# Patient Record
Sex: Male | Born: 1945
Health system: Southern US, Community
[De-identification: ages and names within clinical notes are randomized; demographics above are authoritative.]

## PROBLEM LIST (undated history)

## (undated) DIAGNOSIS — M109 Gout, unspecified: Secondary | ICD-10-CM

## (undated) DIAGNOSIS — D649 Anemia, unspecified: Secondary | ICD-10-CM

## (undated) DIAGNOSIS — Z87442 Personal history of urinary calculi: Secondary | ICD-10-CM

## (undated) DIAGNOSIS — I2510 Atherosclerotic heart disease of native coronary artery without angina pectoris: Secondary | ICD-10-CM

## (undated) DIAGNOSIS — K5792 Diverticulitis of intestine, part unspecified, without perforation or abscess without bleeding: Secondary | ICD-10-CM

## (undated) DIAGNOSIS — I255 Ischemic cardiomyopathy: Secondary | ICD-10-CM

## (undated) DIAGNOSIS — K651 Peritoneal abscess: Secondary | ICD-10-CM

## (undated) DIAGNOSIS — Z91199 Patient's noncompliance with other medical treatment and regimen due to unspecified reason: Secondary | ICD-10-CM

## (undated) DIAGNOSIS — E785 Hyperlipidemia, unspecified: Secondary | ICD-10-CM

## (undated) DIAGNOSIS — I499 Cardiac arrhythmia, unspecified: Secondary | ICD-10-CM

## (undated) DIAGNOSIS — K219 Gastro-esophageal reflux disease without esophagitis: Secondary | ICD-10-CM

## (undated) DIAGNOSIS — B159 Hepatitis A without hepatic coma: Secondary | ICD-10-CM

## (undated) DIAGNOSIS — Z9119 Patient's noncompliance with other medical treatment and regimen: Secondary | ICD-10-CM

## (undated) DIAGNOSIS — E291 Testicular hypofunction: Secondary | ICD-10-CM

## (undated) DIAGNOSIS — I4891 Unspecified atrial fibrillation: Secondary | ICD-10-CM

## (undated) DIAGNOSIS — I214 Non-ST elevation (NSTEMI) myocardial infarction: Secondary | ICD-10-CM

## (undated) HISTORY — DX: Diverticulitis of intestine, part unspecified, without perforation or abscess without bleeding: K57.92

## (undated) HISTORY — DX: Testicular hypofunction: E29.1

## (undated) HISTORY — DX: Patient's noncompliance with other medical treatment and regimen due to unspecified reason: Z91.199

## (undated) HISTORY — DX: Hyperlipidemia, unspecified: E78.5

## (undated) HISTORY — DX: Hepatitis a without hepatic coma: B15.9

## (undated) HISTORY — DX: Gout, unspecified: M10.9

## (undated) HISTORY — DX: Patient's noncompliance with other medical treatment and regimen: Z91.19

## (undated) HISTORY — DX: Gastro-esophageal reflux disease without esophagitis: K21.9

## (undated) HISTORY — PX: COLONOSCOPY: SHX174

---

## 2016-04-30 ENCOUNTER — Encounter: Payer: Self-pay | Admitting: Cardiovascular Disease

## 2016-05-06 ENCOUNTER — Encounter: Payer: Self-pay | Admitting: Cardiovascular Disease

## 2016-05-06 ENCOUNTER — Ambulatory Visit (INDEPENDENT_AMBULATORY_CARE_PROVIDER_SITE_OTHER): Payer: Medicare Other | Admitting: Cardiovascular Disease

## 2016-05-06 VITALS — BP 135/95 | HR 89 | Ht 70.0 in | Wt 185.1 lb

## 2016-05-06 DIAGNOSIS — R0789 Other chest pain: Secondary | ICD-10-CM

## 2016-05-06 DIAGNOSIS — R0609 Other forms of dyspnea: Secondary | ICD-10-CM | POA: Diagnosis not present

## 2016-05-06 DIAGNOSIS — R0602 Shortness of breath: Secondary | ICD-10-CM | POA: Diagnosis not present

## 2016-05-06 NOTE — Progress Notes (Signed)
Cardiology Office Note   Date:  05/06/2016   ID:  Dean [name], DOB 10-May-1946, MRN [id number]  PCP:  No primary care provider on file.  Cardiologist:   Mertie Moores, MD   Chief Complaint  Patient presents with  . Chest Pain   Problem list 1. Chest pain 2. Hyperlipidemia 3. Gout 4. GERD    History of Present Illness: Dean [name] is a 70 y.o. male who presents for further evaluation of some chest pain and shortness of breath. I have reviewed records from [facility] center.  Has intermittant symptoms of DOE and CP Has occasional palpitations  Is hoarse at times  Has had a stress test - normal  Echo was normal .   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   Past Medical History  Diagnosis Date  . Colon polyp   . Hyperlipemia   . Vitamin D deficiency   . Acid reflux   . Gout   . Pollen allergies   . Chest pain   . Screening for HIV (human immunodeficiency virus)   . Need for shingles vaccine   . Special screening for malignant neoplasm of colon   . Alcohol screening   . Cardiac risk counseling   . Non compliance with medical treatment   . Palpitations   . Fatigue   . Rash   . Arthropathy   . Dizziness   . Hypogonadism male   . Urinary frequency   . Night sweats   . Abdominal pain   . Diarrhea   . SOB (shortness of breath)   . Knee pain   . Hematuria     Past Surgical History  Procedure Laterality Date  . None       Current Outpatient Prescriptions  Medication Sig Dispense Refill  . Cholecalciferol (VITAMIN D3) 3000 units TABS Take 3,000 Units by mouth daily.    . diclofenac sodium (VOLTAREN) 1 % GEL Apply 2 g topically 3 (three) times daily.    . febuxostat (ULORIC) 40 MG tablet Take 40 mg by mouth daily.    . indomethacin (INDOCIN) 50 MG capsule Take  50 mg by mouth 3 (three) times daily as needed for mild pain or moderate pain.    Marland Kitchen permethrin (ELIMITE) 5 % cream Apply 1 application topically once. Apply external from neck down at bedtime, wash off in the morning.    . predniSONE (DELTASONE) 5 MG tablet Take 5 mg by mouth daily.     No current facility-administered medications for this visit.    Allergies:   Aspirin ec    Social History:  The patient  reports that he has quit smoking. He does not have any smokeless tobacco history on file. He reports that he does not drink alcohol or use illicit drugs.   Family History:  The patient's family history includes Hyperlipidemia in his brother and mother; Hypertension in his brother and mother.    ROS:  Please see the history of present illness.    Review of Systems: Constitutional:  denies fever, chills, diaphoresis, appetite change and fatigue.  HEENT: denies photophobia, eye pain, redness, hearing loss, ear pain, congestion, sore throat, rhinorrhea, sneezing, neck pain, neck stiffness and tinnitus.  Respiratory: denies SOB, DOE, cough, chest  tightness, and wheezing.  Cardiovascular: denies chest pain, palpitations and leg swelling.  Gastrointestinal: denies nausea, vomiting, abdominal pain, diarrhea, constipation, blood in stool.  Genitourinary: denies dysuria, urgency, frequency, hematuria, flank pain and difficulty urinating.  Musculoskeletal: denies  myalgias, back pain, joint swelling, arthralgias and gait problem.   Skin: denies pallor, rash and wound.  Neurological: denies dizziness, seizures, syncope, weakness, light-headedness, numbness and headaches.   Hematological: denies adenopathy, easy bruising, personal or family bleeding history.  Psychiatric/ Behavioral: denies suicidal ideation, mood changes, confusion, nervousness, sleep disturbance and agitation.       All other systems are reviewed and negative.    PHYSICAL EXAM: VS:  BP 148/100 mmHg  Pulse 89  Ht 5\' 10"   (1.778 m)  Wt 185 lb 1.9 oz (83.97 kg)  BMI 26.56 kg/m2 , BMI Body mass index is 26.56 kg/(m^2). GEN: Well nourished, well developed, in no acute distress HEENT: normal Neck: no JVD, carotid bruits, or masses Cardiac: RRR; no murmurs, rubs, or gallops,no edema  Respiratory:  clear to auscultation bilaterally, normal work of breathing GI: soft, nontender, nondistended, + BS MS: no deformity or atrophy Skin: warm and dry, no rash Neuro:  Strength and sensation are intact Psych: normal   EKG:  EKG is ordered today. The ekg ordered today demonstrates NSR at 89 .   Occasional PVCs    Recent Labs: No results found for requested labs within last 365 days.    Lipid Panel No results found for: CHOL, TRIG, HDL, CHOLHDL, VLDL, LDLCALC, LDLDIRECT    Wt Readings from Last 3 Encounters:  05/06/16 185 lb 1.9 oz (83.97 kg)      Other studies Reviewed: Additional studies/ records that were reviewed today include: . Review of the above records demonstrates:   ASSESSMENT AND PLAN:  1.  Chest pressure  - has already had 2 stress myoviews.   No answers were found as of yet.  Is not inclined to have more stress testing . He does not exercise per se but is active [place]  It sounds like he's not really doing any true cardio exercise. I've challenged him to start a cardio workout program. If he's able to progress with his workouts denied do not think that he has significant coronary artery disease. He has passed his 2 previous stress test.  I will see him in 3 months for follow-up visit.  2. DOE- He has intermittent shortness of breath. At times he states that he has trouble getting out of his car but at other times is able to walk around town and to [employer] without any significant problems. I've reassured him that this does not sound consistent with coronary artery disease. We'll have him start a regular aerobic walking program. He'll call us if he has further  chest pain with walking.  3.  Hyperlipidemia - does not want to start a statin.   We will need to address that at a later visit.  Current medicines are reviewed at length with the patient today.  The patient does not have concerns regarding medicines.  The following changes have been made:  no change  Labs/ tests ordered today include:  No orders of the defined types were placed in this encounter.     Disposition:   FU with me in 3 months      Mertie Moores, MD  05/06/2016 3:56 PM    [address], [place], [place]  [address]  Phone: [phone]; Fax: [phone]   [facility]  [address] [place], [place]  [address] [phone]   Fax [phone]

## 2016-05-06 NOTE — Patient Instructions (Signed)
Medication Instructions:  Your physician recommends that you continue on your current medications as directed. Please refer to the Current Medication list given to you today.   Labwork: None Ordered   Testing/Procedures: None Ordered   Follow-Up: Your physician recommends that you schedule a follow-up appointment in: 3 months with Dr. Nahser   If you need a refill on your cardiac medications before your next appointment, please call your pharmacy.   Thank you for choosing [facility]! Finneus Kaneshiro, RN [phone]    

## 2016-05-17 ENCOUNTER — Encounter: Payer: Self-pay | Admitting: Cardiovascular Disease

## 2016-07-21 ENCOUNTER — Encounter: Payer: Self-pay | Admitting: Cardiovascular Disease

## 2016-08-16 ENCOUNTER — Ambulatory Visit: Payer: Medicare Other | Admitting: Cardiovascular Disease

## 2016-08-26 ENCOUNTER — Encounter: Payer: Self-pay | Admitting: Cardiovascular Disease

## 2016-09-13 ENCOUNTER — Ambulatory Visit (INDEPENDENT_AMBULATORY_CARE_PROVIDER_SITE_OTHER): Payer: Medicare Other | Admitting: Cardiovascular Disease

## 2016-09-13 ENCOUNTER — Encounter (INDEPENDENT_AMBULATORY_CARE_PROVIDER_SITE_OTHER): Payer: Self-pay

## 2016-09-13 ENCOUNTER — Encounter: Payer: Self-pay | Admitting: Cardiovascular Disease

## 2016-09-13 VITALS — BP 140/100 | HR 88 | Ht 70.0 in | Wt 179.0 lb

## 2016-09-13 DIAGNOSIS — R0789 Other chest pain: Secondary | ICD-10-CM | POA: Diagnosis not present

## 2016-09-13 NOTE — Progress Notes (Signed)
Cardiology Office Note   Date:  09/13/2016   ID:  Vong Barger, DOB [birth year], MRN [id number]  PCP:  [facility]  Cardiologist:   Mertie Moores, MD   Chief Complaint  Patient presents with  . Follow-up    shortness of breath    Problem list 1. Chest pain 2. Hyperlipidemia 3. Gout 4. GERD    History of Present Illness: Dean [name] is a 70 y.o. male who presents for further evaluation of some chest pain and shortness of breath. I have reviewed records from [facility] center.  Has intermittant symptoms of DOE and CP Has occasional palpitations  Is hoarse at times  Has had a stress test - normal  Echo was normal .   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   Oct. 9, 2017:  Has started exercising some.   Is having gout issues which limits his exercise at time  Able to walk around without any significant CP or dyspnea.     Past Medical History:  Diagnosis Date  . Abdominal pain   . Acid reflux   . Alcohol screening   . Arthropathy   . Cardiac risk counseling   . Chest pain   . Colon polyp   . Diarrhea   . Dizziness   . Fatigue   . Gout   . Hematuria   . Hyperlipemia   . Hypogonadism male   . Knee pain   . Need for shingles vaccine   . Night sweats   . Non compliance with medical treatment   . Palpitations   . Pollen allergies   . Rash   . Screening for HIV (human immunodeficiency virus)   . SOB (shortness of breath)   . Special screening for malignant neoplasm of colon   . Urinary frequency   . Vitamin D deficiency     Past Surgical History:  Procedure Laterality Date  . NONE       Current Outpatient Prescriptions  Medication Sig Dispense Refill  . Cholecalciferol (VITAMIN D3) 3000 units TABS Take 3,000 Units by mouth daily.    .  febuxostat (ULORIC) 40 MG tablet Take 40 mg by mouth daily.    . predniSONE (DELTASONE) 5 MG tablet Take 5 mg by mouth daily.     No current facility-administered medications for this visit.     Allergies:   Aspirin ec [aspirin]    Social History:  The patient  reports that he has quit smoking. He has never used smokeless tobacco. He reports that he does not drink alcohol or use drugs.   Family History:  The patient's family history includes Hyperlipidemia in his brother and mother; Hypertension in his brother and mother.    ROS:  Please see the history of present illness.    Review of Systems: Constitutional:  denies fever, chills, diaphoresis, appetite change and fatigue.  HEENT: denies photophobia, eye pain, redness, hearing loss, ear pain, congestion, sore throat, rhinorrhea, sneezing, neck pain, neck stiffness and tinnitus.  Respiratory: denies SOB, DOE, cough, chest tightness, and wheezing.  Cardiovascular: denies chest pain, palpitations and leg swelling.  Gastrointestinal: denies nausea, vomiting, abdominal pain, diarrhea, constipation, blood in stool.  Genitourinary: denies dysuria, urgency, frequency, hematuria, flank pain and difficulty  urinating.  Musculoskeletal: denies  myalgias, back pain, joint swelling, arthralgias and gait problem.   Skin: denies pallor, rash and wound.  Neurological: denies dizziness, seizures, syncope, weakness, light-headedness, numbness and headaches.   Hematological: denies adenopathy, easy bruising, personal or family bleeding history.  Psychiatric/ Behavioral: denies suicidal ideation, mood changes, confusion, nervousness, sleep disturbance and agitation.       All other systems are reviewed and negative.    PHYSICAL EXAM: VS:  BP (!) 140/100 (BP Location: Right Arm, Patient Position: Sitting, Cuff Size: Normal)   Pulse 88   Ht 5\' 10"  (1.778 m)   Wt 179 lb (81.2 kg)   BMI 25.68 kg/m  , BMI Body mass index is 25.68 kg/m. GEN: Well  nourished, well developed, in no acute distress  HEENT: normal  Neck: no JVD, carotid bruits, or masses Cardiac: RRR; no murmurs, rubs, or gallops,no edema  Respiratory:  clear to auscultation bilaterally, normal work of breathing GI: soft, nontender, nondistended, + BS MS: no deformity or atrophy  Skin: warm and dry, no rash Neuro:  Strength and sensation are intact Psych: normal   EKG:  EKG is ordered today. The ekg ordered today demonstrates NSR at 89 .   Occasional PVCs    Recent Labs: No results found for requested labs within last 8760 hours.    Lipid Panel No results found for: CHOL, TRIG, HDL, CHOLHDL, VLDL, LDLCALC, LDLDIRECT    Wt Readings from Last 3 Encounters:  09/13/16 179 lb (81.2 kg)  05/06/16 185 lb 1.9 oz (84 kg)      Other studies Reviewed: Additional studies/ records that were reviewed today include: . Review of the above records demonstrates:   ASSESSMENT AND PLAN:  1.  Chest pressure  - has already had 2 stress myoviews.   No answers were found as of yet.  Is not inclined to have more stress testing . No further episodes   I will see him in 3 months for follow-up visit.  2. DOE- He has intermittent shortness of breath. At times he states that he has trouble getting out of his car but at other times is able to walk around town and to [employer] without any significant problems. I've reassured him that this does not sound consistent with coronary artery disease. We'll have him start a regular aerobic walking program. He'll call us if he has further chest pain with walking.  His symptoms are completely better. He's now able to do all his normal activities without significant shortness of breath or chest pain. I'll see him on an as-needed basis.  3.  Hyperlipidemia - does not want to start a statin.    He'll discuss this with his medical doctor.   I'll see him on an as-needed basis.  Current medicines are reviewed at length with the patient  today.  The patient does not have concerns regarding medicines.  The following changes have been made:  no change  Labs/ tests ordered today include:  No orders of the defined types were placed in this encounter.    Disposition:   FU with me as needed.      Mertie Moores, MD  09/13/2016 4:33 PM    [facility] [place], [place], [place]  [address] Phone: [phone]; Fax: [phone]   [facility]  [address] [place], [place]  [address] [phone]   Fax [phone]

## 2016-09-13 NOTE — Patient Instructions (Signed)

## 2017-07-22 ENCOUNTER — Encounter: Payer: Self-pay | Admitting: *Deleted

## 2017-07-25 ENCOUNTER — Ambulatory Visit (INDEPENDENT_AMBULATORY_CARE_PROVIDER_SITE_OTHER): Payer: Medicare Other | Admitting: Cardiovascular Disease

## 2017-07-25 ENCOUNTER — Encounter: Payer: Self-pay | Admitting: Cardiovascular Disease

## 2017-07-25 VITALS — BP 110/70 | HR 81 | Ht 69.5 in | Wt 169.8 lb

## 2017-07-25 DIAGNOSIS — R5383 Other fatigue: Secondary | ICD-10-CM

## 2017-07-25 DIAGNOSIS — R079 Chest pain, unspecified: Secondary | ICD-10-CM | POA: Diagnosis not present

## 2017-07-25 DIAGNOSIS — I493 Ventricular premature depolarization: Secondary | ICD-10-CM | POA: Diagnosis not present

## 2017-07-25 DIAGNOSIS — R9431 Abnormal electrocardiogram [ECG] [EKG]: Secondary | ICD-10-CM

## 2017-07-25 NOTE — Patient Instructions (Addendum)
Medication Instructions:  Your physician recommends that you continue on your current medications as directed. Please refer to the Current Medication list given to you today.   Labwork: Your physician recommends that you return for lab work in: a few days prior to your Coronary CT    Testing/Procedures: Your physician has requested that you have an echocardiogram. Echocardiography is a painless test that uses sound waves to create images of your heart. It provides your doctor with information about the size and shape of your heart and how well your heart's chambers and valves are working. This procedure takes approximately one hour. There are no restrictions for this procedure.  Your physician has requested that you have a Coronary CT. You will receive a call from our scheduling team to get this schedule. The test will be done at [facility]: Your physician recommends that you schedule a follow-up appointment in: 3 months with Dr. Acie Fredrickson   If you need a refill on your cardiac medications before your next appointment, please call your pharmacy.   Thank you for choosing [facility]! Christen Bame, RN [phone]

## 2017-07-25 NOTE — Progress Notes (Signed)
Cardiology Office Note   Date:  07/25/2017   ID:  Dean [name], DOB 1946/06/04, MRN [id number]  PCP:  [facility] [place]  Cardiologist:   Mertie Moores, MD   Chief Complaint  Patient presents with  . Follow-up    Eval PVC's per [name], PA @ [facility]   Problem list 1. Chest pain 2. Hyperlipidemia 3. Gout 4. GERD    History of Present Illness: Dean [name] is a 71 y.o. male who presents for further evaluation of some chest pain and shortness of breath. I have reviewed records from [facility] center.  Has intermittant symptoms of DOE and CP Has occasional palpitations  Is hoarse at times  Has had a stress test - normal  Echo was normal .   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   Oct. 9, 2017:  Has started exercising some.   Is having gout issues which limits his exercise at time  Able to walk around without any significant CP or dyspnea.    Aug. 20, 2018  Dean [name] is seen back for follow-up visit for his premature ventricular contractions. Is taking Uloric for gouty tophi, Makes him nauseated, fatigued.   Has not had any exertional CP recently  Is on Indocin Saw his GP , saw something odd on ECG and he was sent here.  ECG here shows NSR , NS ST abn.  PVCs  Does not exercise,   No hx of CP or DOE Has rare episodes of CP when he is lying down .    Past Medical History:  Diagnosis Date  . Abdominal pain   . Acid reflux   . Alcohol screening   . Arthropathy   . Cardiac risk counseling   . Chest pain   . Colon polyp   . Diarrhea   . Dizziness   . Fatigue   . Gout   . Hematuria   . Hyperlipemia   . Hypogonadism male   . Knee pain   . Need for shingles vaccine   . Night sweats   . Non compliance with medical treatment   .  Palpitations   . Pollen allergies   . Rash   . Screening for HIV (human immunodeficiency virus)   . SOB (shortness of breath)   . Special screening for malignant neoplasm of colon   . Urinary frequency   . Vitamin D deficiency     Past Surgical History:  Procedure Laterality Date  . NONE       Current Outpatient Prescriptions  Medication Sig Dispense Refill  . Cholecalciferol (VITAMIN D3) 3000 units TABS Take 3,000 Units by mouth daily.    . febuxostat (ULORIC) 40 MG tablet Take 40 mg by mouth daily.    . indomethacin (INDOCIN) 50 MG capsule Take 50 mg by mouth as directed. Take 50 mg by mouth as needed at the first sign of gout     No current facility-administered medications for this visit.     Allergies:   Aspirin ec [aspirin]    Social History:  The patient  reports that he has quit smoking. He has never used smokeless tobacco. He reports that he does not drink alcohol or use drugs.   Family History:  The patient's family history includes Hyperlipidemia in his brother and mother; Hypertension in his brother and mother.    ROS:  Please see the history of present illness.    Review of Systems: Constitutional:  denies fever, chills, diaphoresis, appetite change and fatigue.  HEENT: denies photophobia, eye pain, redness, hearing loss, ear pain, congestion, sore throat, rhinorrhea, sneezing, neck pain, neck stiffness and tinnitus.  Respiratory: denies SOB, DOE, cough, chest tightness, and wheezing.  Cardiovascular: denies chest pain, palpitations and leg swelling.  Gastrointestinal: denies nausea, vomiting, abdominal pain, diarrhea, constipation, blood in stool.  Genitourinary: denies dysuria, urgency, frequency, hematuria, flank pain and difficulty urinating.  Musculoskeletal: denies  myalgias, back pain, joint swelling, arthralgias and gait problem.   Skin: denies pallor, rash and wound.  Neurological: denies dizziness, seizures, syncope, weakness, light-headedness,  numbness and headaches.   Hematological: denies adenopathy, easy bruising, personal or family bleeding history.  Psychiatric/ Behavioral: denies suicidal ideation, mood changes, confusion, nervousness, sleep disturbance and agitation.       All other systems are reviewed and negative.    PHYSICAL EXAM: VS:  BP 110/70   Pulse 81   Ht 5' 9.5" (1.765 m)   Wt 169 lb 12.8 oz (77 kg)   BMI 24.72 kg/m  , BMI Body mass index is 24.72 kg/m. GEN: Well nourished, well developed, in no acute distress  HEENT: normal  Neck: no JVD, carotid bruits, or masses Cardiac: RRR; no murmurs, rubs, or gallops,no edema  Respiratory:  clear to auscultation bilaterally, normal work of breathing GI: soft, nontender, nondistended, + BS MS: no deformity or atrophy  Skin: warm and dry, no rash Neuro:  Strength and sensation are intact Psych: normal   EKG:  EKG is ordered today. The ekg ordered today demonstrates NSR at 89 .    NS ST abn.  Occasional PVCs    Recent Labs: No results found for requested labs within last 8760 hours.    Lipid Panel No results found for: CHOL, TRIG, HDL, CHOLHDL, VLDL, LDLCALC, LDLDIRECT    Wt Readings from Last 3 Encounters:  07/25/17 169 lb 12.8 oz (77 kg)  09/13/16 179 lb (81.2 kg)  05/06/16 185 lb 1.9 oz (84 kg)      Other studies Reviewed: Additional studies/ records that were reviewed today include: . Review of the above records demonstrates:   ASSESSMENT AND PLAN:  1.  Chest pressure  - has already A stress Myoview study in the past. It was reportedly normal. I would like to get a coronary CT angiogram for further evaluation. I would also like to get an echocardiogram to further evaluate his left ventricular function.  I will see him in 3 months for follow-up visit.  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 We'll check a magnesium level and basic mental profile today. He's had his thyroid measured  recently by his medical doctor.  3.  Hyperlipidemia - does not want to start a statin.    He'll discuss this with his medical doctor.   I'll see him on an as-needed basis.  Current medicines are reviewed at length with the patient today.  The patient does not have concerns regarding medicines.  The following changes have been made:  no change  Labs/ tests ordered today include:  No orders of the defined types were placed in this encounter.    Disposition:   FU with me as needed.      Mertie Moores, MD  07/25/2017 2:02 PM  [facility] [place], [place], [place]  [address] Phone: [phone]; Fax: [phone]

## 2017-07-27 ENCOUNTER — Other Ambulatory Visit: Payer: Self-pay

## 2017-07-27 ENCOUNTER — Ambulatory Visit (HOSPITAL_COMMUNITY): Payer: Medicare Other | Attending: Cardiology

## 2017-07-27 DIAGNOSIS — I517 Cardiomegaly: Secondary | ICD-10-CM | POA: Diagnosis not present

## 2017-07-27 DIAGNOSIS — R9431 Abnormal electrocardiogram [ECG] [EKG]: Secondary | ICD-10-CM | POA: Diagnosis not present

## 2017-07-27 DIAGNOSIS — R002 Palpitations: Secondary | ICD-10-CM | POA: Diagnosis not present

## 2017-07-27 DIAGNOSIS — I493 Ventricular premature depolarization: Secondary | ICD-10-CM | POA: Diagnosis not present

## 2017-07-27 DIAGNOSIS — R0600 Dyspnea, unspecified: Secondary | ICD-10-CM | POA: Insufficient documentation

## 2017-07-27 DIAGNOSIS — R079 Chest pain, unspecified: Secondary | ICD-10-CM | POA: Insufficient documentation

## 2017-07-27 DIAGNOSIS — Z87891 Personal history of nicotine dependence: Secondary | ICD-10-CM | POA: Insufficient documentation

## 2017-07-28 ENCOUNTER — Telehealth: Payer: Self-pay | Admitting: Cardiovascular Disease

## 2017-07-28 NOTE — Telephone Encounter (Signed)
Per pt please give him a  Call back after 3pm, with his results.     thanks

## 2017-07-29 NOTE — Telephone Encounter (Signed)
Reviewed results with patient who verbalized understanding and thanked me for the call

## 2017-08-11 ENCOUNTER — Encounter: Payer: Self-pay | Admitting: Gastroenterology

## 2017-08-31 ENCOUNTER — Encounter: Payer: Self-pay | Admitting: Cardiovascular Disease

## 2017-08-31 ENCOUNTER — Telehealth: Payer: Self-pay | Admitting: *Deleted

## 2017-08-31 NOTE — Telephone Encounter (Signed)
We have left several messages for Dean [name],to give our office a call to schedule his Cardiac CT.

## 2017-09-02 ENCOUNTER — Other Ambulatory Visit: Payer: Medicare Other | Admitting: *Deleted

## 2017-09-02 DIAGNOSIS — R5383 Other fatigue: Secondary | ICD-10-CM

## 2017-09-02 DIAGNOSIS — R9431 Abnormal electrocardiogram [ECG] [EKG]: Secondary | ICD-10-CM

## 2017-09-02 DIAGNOSIS — I493 Ventricular premature depolarization: Secondary | ICD-10-CM

## 2017-09-02 DIAGNOSIS — R079 Chest pain, unspecified: Secondary | ICD-10-CM

## 2017-09-03 LAB — BASIC METABOLIC PANEL
BUN/Creatinine Ratio: 14 (ref 10–24)
BUN: 16 mg/dL (ref 8–27)
CHLORIDE: 101 mmol/L (ref 96–106)
CO2: 24 mmol/L (ref 20–29)
CREATININE: 1.12 mg/dL (ref 0.76–1.27)
Calcium: 8.8 mg/dL (ref 8.6–10.2)
GFR calc Af Amer: 77 mL/min/{1.73_m2} (ref >59)
GFR calc non Af Amer: 67 mL/min/{1.73_m2} (ref >59)
GLUCOSE: 115 mg/dL — AB (ref 65–99)
Potassium: 4.2 mmol/L (ref 3.5–5.2)
SODIUM: 139 mmol/L (ref 134–144)

## 2017-09-03 LAB — MAGNESIUM: MAGNESIUM: 1.8 mg/dL (ref 1.6–2.3)

## 2017-09-08 ENCOUNTER — Telehealth: Payer: Self-pay | Admitting: Nurse Practitioner

## 2017-09-08 ENCOUNTER — Telehealth: Payer: Self-pay | Admitting: Cardiovascular Disease

## 2017-09-08 ENCOUNTER — Ambulatory Visit (HOSPITAL_COMMUNITY)
Admission: RE | Admit: 2017-09-08 | Discharge: 2017-09-08 | Disposition: A | Discharge status: Home / Self Care | Payer: Medicare Other | Source: Ambulatory Visit | Attending: Cardiovascular Disease | Admitting: Cardiovascular Disease

## 2017-09-08 ENCOUNTER — Ambulatory Visit (HOSPITAL_COMMUNITY): Payer: Medicare Other

## 2017-09-08 ENCOUNTER — Encounter (HOSPITAL_COMMUNITY): Payer: Self-pay

## 2017-09-08 DIAGNOSIS — R079 Chest pain, unspecified: Secondary | ICD-10-CM

## 2017-09-08 DIAGNOSIS — Z538 Procedure and treatment not carried out for other reasons: Secondary | ICD-10-CM | POA: Diagnosis present

## 2017-09-08 DIAGNOSIS — R9431 Abnormal electrocardiogram [ECG] [EKG]: Secondary | ICD-10-CM

## 2017-09-08 MED ORDER — METOPROLOL TARTRATE 5 MG/5ML IV SOLN
5.0000 mg | Freq: Once | INTRAVENOUS | Status: AC
  Administered 2017-09-08: 5 mg via INTRAVENOUS

## 2017-09-08 MED ORDER — METOPROLOL TARTRATE 5 MG/5ML IV SOLN
INTRAVENOUS | Status: DC
Start: 2017-09-08 — End: 2017-09-09
  Filled 2017-09-08: qty 15

## 2017-09-08 MED ORDER — NITROGLYCERIN 0.4 MG SL SUBL: 0.8000 mg | SUBLINGUAL_TABLET | Freq: Once | SUBLINGUAL | Status: DC

## 2017-09-08 MED ORDER — NITROGLYCERIN 0.4 MG SL SUBL
SUBLINGUAL_TABLET | SUBLINGUAL | Status: AC
  Filled 2017-09-08: qty 2

## 2017-09-08 MED ORDER — IOPAMIDOL (ISOVUE-370) INJECTION 76%
INTRAVENOUS | Status: AC
  Filled 2017-09-08: qty 100

## 2017-09-08 MED ORDER — METOPROLOL TARTRATE 5 MG/5ML IV SOLN
10.0000 mg | INTRAVENOUS | Status: DC
  Filled 2017-09-08: qty 10

## 2017-09-08 NOTE — Telephone Encounter (Signed)
We had scheduled a CT angiogram. He was having many PVCs so we were unable to do the study If he is having CP or other symptoms, he may need a follow up visit He has had a myoview recently ( outside hospital ) which was normal     Mertie Moores, MD  09/08/2017 1:57 PM    [facility] [address]  [place], [place]  [address] Pager [phone] Phone: [phone]; Fax: [phone]

## 2017-09-08 NOTE — Telephone Encounter (Signed)
We had scheduled a CT angiogram. He was having many PVCs so we were unable to do the study If he is having CP or other symptoms, he may need a follow up visit He has had a myoview recently ( outside hospital ) which was normal   Mertie Moores, MD  09/08/2017 1:57 PM    [facility] [address],  [place], [place]  [address] Pager [phone] Phone: [phone]; Fax: [phone]   Called patient to discuss test results and next steps. He complains of 1/2 hour of chills after the CT today; states they have now resolved. I asked him if he continues to have chest discomfort or other symptoms that concern him and asked if he would like to come in to discuss symptoms with Dr. Acie Fredrickson since the CT was inconclusive. We discussed treatment options for his frequent PVCs including low dose beta blocker therapy and/or wearing a monitor to determine frequency burden. He states he is willing to do whatever Dr. Acie Fredrickson wants him to do. I asked if he would like to try Toprol XL 25 mg daily for a few weeks to see if this helps PVCs. He states he has an upcoming appointment with  GI for a different issue. He states he would like to wait until after that appointment to decide on further treatment for his frequent PVCs. He is aware of his follow-up appointment in November with Dr. Acie Fredrickson. I advised him to call back with questions or concerns prior to that visit. He verbalized understanding and agreement and thanked me for the call.

## 2017-10-03 ENCOUNTER — Ambulatory Visit (INDEPENDENT_AMBULATORY_CARE_PROVIDER_SITE_OTHER): Payer: Medicare Other | Admitting: Gastroenterology

## 2017-10-03 ENCOUNTER — Other Ambulatory Visit: Payer: Medicare Other

## 2017-10-03 VITALS — BP 100/56 | HR 50 | Ht 69.5 in | Wt 163.0 lb

## 2017-10-03 DIAGNOSIS — R197 Diarrhea, unspecified: Secondary | ICD-10-CM

## 2017-10-03 DIAGNOSIS — R634 Abnormal weight loss: Secondary | ICD-10-CM

## 2017-10-03 DIAGNOSIS — R109 Unspecified abdominal pain: Secondary | ICD-10-CM | POA: Diagnosis not present

## 2017-10-03 DIAGNOSIS — D509 Iron deficiency anemia, unspecified: Secondary | ICD-10-CM | POA: Diagnosis not present

## 2017-10-03 NOTE — Patient Instructions (Signed)
If you are age 69 or older, your body mass index should be between 23-30. Your Body mass index is 23.73 kg/m. If this is out of the aforementioned range listed, please consider follow up with your Primary Care Provider.  If you are age 58 or younger, your body mass index should be between 19-25. Your Body mass index is 23.73 kg/m. If this is out of the aformentioned range listed, please consider follow up with your Primary Care Provider.   You have been scheduled for a CT scan of the abdomen and pelvis at [place] ([id number]---this is in the same building as Press photographer).   You are scheduled on Friday, November 2nd at 1:30pm. You should arrive 15 minutes prior to your appointment time for registration. Please follow the written instructions below on the day of your exam:  WARNING: IF YOU ARE ALLERGIC TO IODINE/X-RAY DYE, PLEASE NOTIFY RADIOLOGY IMMEDIATELY AT [phone]! YOU WILL BE GIVEN A 13 HOUR PREMEDICATION PREP.  1) Do not eat anything after 9:30am(4 hours prior to your test) liquids are ok. 2) You have been given 2 bottles of oral contrast to drink. The solution may taste better if refrigerated, but do NOT add ice or any other liquid to this solution. Shake well before drinking.    Drink 1 bottle of contrast @ 11:30am (2 hours prior to your exam)  Drink 1 bottle of contrast @ 12:30pm (1 hour prior to your exam)  You may take any medications as prescribed with a small amount of water except for the following: Metformin, Glucophage, Glucovance, Avandamet, Riomet, Fortamet, Actoplus Met, Janumet, Glumetza or Metaglip. The above medications must be held the day of the exam AND 48 hours after the exam.  The purpose of you drinking the oral contrast is to aid in the visualization of your intestinal tract. The contrast solution may cause some diarrhea. Before your exam is started, you will be given a small amount of fluid to drink. Depending on your individual set  of symptoms, you may also receive an intravenous injection of x-ray contrast/dye. Plan on being at [facility] for 30 minutes or longer, depending on the type of exam you are having performed.  This test typically takes 30-45 minutes to complete.  If you have any questions regarding your exam or if you need to reschedule, you may call the CT department at [phone] between the hours of 8:00 am and 5:00 pm, Monday-Friday.  ________________________________________________________________________  Your physician has requested that you go to the basement for the following lab work before leaving today:  Celiac Labs, GI pathogen panel, fecal elastase, Cortisol (need to be fasting for this lab)  Please follow up in 3-4 weeks with Nicoletta Ba, PA on 10/31/17 at 1:30pm.  Thank you.

## 2017-10-03 NOTE — Progress Notes (Signed)
History of Present Illness: This is a 71 year old male referred by Dean Hummingbird, PA-C, [facility], for the evaluation of rectal discharge, diarrhea, abdominal pain, weight loss, back pain for 2 years.  Also complains of severe fatigue and a low energy level for 2 years.  States he underwent a colonoscopy about 3.5 years ago at [facility] and he was told this exam was normal.  Had no gastrointestinal complaints at that time of his colonoscopy and he states that he has not returned to that gastroenterologist for evaluation of his current symptoms.  His main complaint is multiple, small, mucousy bowel movements per day.  His appetite has been decreased and he reports a gradual 40 pound weight loss over the past 2 years.  He has intermittent left-sided abdominal pain and left back pain not clearly associated with meals. These symptoms are intermittent and have no clear precipitating cause.  He took Nexium daily for about 8 days with no change in symptoms so he discontinued it.  He denies any recent antibiotic usage.  Recent blood work was unremarkable except for a mildly low Hb=12.2 (12.5-17 normal range) and Fe=28 (70-180 normal range). Abdominal ultrasound performed in August 2018 showed suboptimal examination of multiple organs due to gas.  The US exam was otherwise grossly normal.  After seeing several different rheumatologists he was eventually diagnosed with tophaceous gout about 2-3 years ago.  He has a rheumatologist at [facility].  He was treated with prednisone for about 2 years and had discontinued prednisone about 6 months ago. He continues on Uloric daily and takes Indocin intermittently for flares. Denies constipation, change in stool caliber, melena, hematochezia, nausea, vomiting, dysphagia, reflux symptoms, chest pain.   Allergies  Allergen Reactions  . Aspirin Ec [Aspirin]     Will flare up gout   Outpatient Medications Prior to Visit  Medication Sig Dispense Refill    . febuxostat (ULORIC) 40 MG tablet Take 40 mg by mouth daily.    . indomethacin (INDOCIN) 50 MG capsule Take 50 mg by mouth as directed. Take 50 mg by mouth as needed at the first sign of gout    . Cholecalciferol (VITAMIN D3) 3000 units TABS Take 3,000 Units by mouth daily.     No facility-administered medications prior to visit.    Past Medical History:  Diagnosis Date  . Abdominal pain   . Acid reflux   . Alcohol screening   . Arthropathy   . Cardiac risk counseling   . Chest pain   . Colon polyp   . Diarrhea   . Dizziness   . Fatigue   . Gout   . Hematuria   . Hepatitis A age 53  . Hyperlipemia   . Hypogonadism male   . Knee pain   . Need for shingles vaccine   . Night sweats   . Non compliance with medical treatment   . Palpitations   . Pollen allergies   . Rash   . Screening for HIV (human immunodeficiency virus)   . SOB (shortness of breath)   . Special screening for malignant neoplasm of colon   . Urinary frequency   . Vitamin D deficiency    Past Surgical History:  Procedure Laterality Date  . NONE     Social History   Social History  . Marital status: Single    Spouse name: N/A  . Number of children: N/A  . Years of education: N/A   Occupational History  .  retired    Social History Main Topics  . Smoking status: Former Research scientist (life sciences)  . Smokeless tobacco: Never Used  . Alcohol use No  . Drug use: No  . Sexual activity: Not on file   Other Topics Concern  . Not on file   Social History Narrative  . No narrative on file   Family History  Problem Relation Age of Onset  . Hyperlipidemia Mother   . Hypertension Mother   . Hyperlipidemia Brother   . Hypertension Brother   . Colon cancer Neg Hx   . Esophageal cancer Neg Hx   . Stomach cancer Neg Hx       Review of Systems: Pertinent positive and negative review of systems were noted in the above HPI section. All other review of systems were otherwise negative.    Physical Exam: General:  Well developed, well nourished, no acute distress Head: Normocephalic and atraumatic Eyes:  sclerae anicteric, EOMI Ears: Normal auditory acuity Mouth: No deformity or lesions Neck: Supple, no masses or thyromegaly Lungs: Clear throughout to auscultation Heart: Regular rate and rhythm; no murmurs, rubs or bruits Abdomen: Soft, non tender and non distended. No masses, hepatosplenomegaly or hernias noted. Normal Bowel sounds Musculoskeletal: Symmetrical with no gross deformities  Skin: No lesions on visible extremities Pulses:  Normal pulses noted Extremities: No clubbing, cyanosis, edema or deformities noted Neurological: Alert oriented x 4, grossly nonfocal Cervical Nodes:  No significant cervical adenopathy Inguinal Nodes: No significant inguinal adenopathy Psychological:  Alert and cooperative. Normal mood and affect  Assessment and Recommendations:  1.  Diarrhea, mucus per rectum, weight loss, iron deficiency anemia, intermittent left-sided abdominal pain, fatigue, low energy, decreased appetite.  Rule out IBD, infectious diarrhea, occult neoplasm, ulcer, etc.  Attempt to obtain records from prior colonoscopy.  Obtain IgA, tTG, fasting morning serum cortisol, stool Hemoccult, fecal elastase and GI pathogen panel.  Schedule abdominal/pelvic CT.  Pending findings will likely need colonoscopy and EGD at follow-up visit in 1 month.  2 . Tophaceous gout improving. Follow up with Dr. Nadara Mustard at [facility].    cc: Center, [facility] [address] [place], [place] [address]

## 2017-10-05 ENCOUNTER — Other Ambulatory Visit (INDEPENDENT_AMBULATORY_CARE_PROVIDER_SITE_OTHER): Payer: Medicare Other

## 2017-10-05 DIAGNOSIS — R197 Diarrhea, unspecified: Secondary | ICD-10-CM | POA: Diagnosis not present

## 2017-10-05 DIAGNOSIS — R109 Unspecified abdominal pain: Secondary | ICD-10-CM

## 2017-10-05 DIAGNOSIS — R634 Abnormal weight loss: Secondary | ICD-10-CM | POA: Diagnosis not present

## 2017-10-05 LAB — CORTISOL: CORTISOL PLASMA: 18.5 ug/dL

## 2017-10-05 LAB — IGA: IGA: 106 mg/dL (ref 68–378)

## 2017-10-06 DIAGNOSIS — I4891 Unspecified atrial fibrillation: Secondary | ICD-10-CM

## 2017-10-06 DIAGNOSIS — I214 Non-ST elevation (NSTEMI) myocardial infarction: Secondary | ICD-10-CM

## 2017-10-06 HISTORY — DX: Unspecified atrial fibrillation: I48.91

## 2017-10-06 HISTORY — DX: Non-ST elevation (NSTEMI) myocardial infarction: I21.4

## 2017-10-07 ENCOUNTER — Telehealth: Payer: Self-pay | Admitting: Gastroenterology

## 2017-10-07 ENCOUNTER — Ambulatory Visit (INDEPENDENT_AMBULATORY_CARE_PROVIDER_SITE_OTHER)
Admission: RE | Admit: 2017-10-07 | Discharge: 2017-10-07 | Disposition: A | Discharge status: Home / Self Care | Payer: Medicare Other | Source: Ambulatory Visit | Attending: Gastroenterology | Admitting: Gastroenterology

## 2017-10-07 ENCOUNTER — Encounter (HOSPITAL_COMMUNITY): Payer: Self-pay | Admitting: Emergency Medicine

## 2017-10-07 ENCOUNTER — Observation Stay (HOSPITAL_COMMUNITY)
Admission: EM | Admit: 2017-10-07 | Discharge: 2017-10-08 | Disposition: A | Discharge status: Home / Self Care | Payer: Medicare Other | Attending: Surgery | Admitting: Surgery

## 2017-10-07 DIAGNOSIS — E785 Hyperlipidemia, unspecified: Secondary | ICD-10-CM | POA: Diagnosis not present

## 2017-10-07 DIAGNOSIS — I77811 Abdominal aortic ectasia: Secondary | ICD-10-CM | POA: Diagnosis not present

## 2017-10-07 DIAGNOSIS — K912 Postsurgical malabsorption, not elsewhere classified: Secondary | ICD-10-CM | POA: Diagnosis not present

## 2017-10-07 DIAGNOSIS — E230 Hypopituitarism: Secondary | ICD-10-CM | POA: Diagnosis not present

## 2017-10-07 DIAGNOSIS — Z87891 Personal history of nicotine dependence: Secondary | ICD-10-CM | POA: Diagnosis not present

## 2017-10-07 DIAGNOSIS — I493 Ventricular premature depolarization: Secondary | ICD-10-CM | POA: Insufficient documentation

## 2017-10-07 DIAGNOSIS — M109 Gout, unspecified: Secondary | ICD-10-CM | POA: Diagnosis not present

## 2017-10-07 DIAGNOSIS — K4020 Bilateral inguinal hernia, without obstruction or gangrene, not specified as recurrent: Secondary | ICD-10-CM | POA: Diagnosis not present

## 2017-10-07 DIAGNOSIS — R102 Pelvic and perineal pain: Secondary | ICD-10-CM

## 2017-10-07 DIAGNOSIS — R197 Diarrhea, unspecified: Secondary | ICD-10-CM | POA: Diagnosis not present

## 2017-10-07 DIAGNOSIS — E559 Vitamin D deficiency, unspecified: Secondary | ICD-10-CM | POA: Diagnosis not present

## 2017-10-07 DIAGNOSIS — K219 Gastro-esophageal reflux disease without esophagitis: Secondary | ICD-10-CM | POA: Insufficient documentation

## 2017-10-07 DIAGNOSIS — I700 Atherosclerosis of aorta: Secondary | ICD-10-CM | POA: Insufficient documentation

## 2017-10-07 DIAGNOSIS — K5720 Diverticulitis of large intestine with perforation and abscess without bleeding: Principal | ICD-10-CM | POA: Diagnosis present

## 2017-10-07 LAB — COMPREHENSIVE METABOLIC PANEL
ALBUMIN: 3.2 g/dL — AB (ref 3.5–5.0)
ALT: 10 U/L — AB (ref 17–63)
AST: 17 U/L (ref 15–41)
Alkaline Phosphatase: 77 U/L (ref 38–126)
Anion gap: 10 (ref 5–15)
BUN: 15 mg/dL (ref 6–20)
CHLORIDE: 101 mmol/L (ref 101–111)
CO2: 21 mmol/L — ABNORMAL LOW (ref 22–32)
CREATININE: 1.37 mg/dL — AB (ref 0.61–1.24)
Calcium: 9.1 mg/dL (ref 8.9–10.3)
GFR calc Af Amer: 59 mL/min — ABNORMAL LOW (ref >60)
GFR, EST NON AFRICAN AMERICAN: 51 mL/min — AB (ref >60)
GLUCOSE: 117 mg/dL — AB (ref 65–99)
POTASSIUM: 3.9 mmol/L (ref 3.5–5.1)
Sodium: 132 mmol/L — ABNORMAL LOW (ref 135–145)
Total Bilirubin: 0.5 mg/dL (ref 0.3–1.2)
Total Protein: 7.4 g/dL (ref 6.5–8.1)

## 2017-10-07 LAB — CBC WITH DIFFERENTIAL/PLATELET
Basophils Absolute: 0 10*3/uL (ref 0.0–0.1)
Basophils Relative: 0 %
EOS ABS: 0 10*3/uL (ref 0.0–0.7)
EOS PCT: 0 %
HCT: 36 % — ABNORMAL LOW (ref 39.0–52.0)
Hemoglobin: 11.7 g/dL — ABNORMAL LOW (ref 13.0–17.0)
LYMPHS ABS: 1.1 10*3/uL (ref 0.7–4.0)
LYMPHS PCT: 9 %
MCH: 27.4 pg (ref 26.0–34.0)
MCHC: 32.5 g/dL (ref 30.0–36.0)
MCV: 84.3 fL (ref 78.0–100.0)
MONO ABS: 0.6 10*3/uL (ref 0.1–1.0)
MONOS PCT: 5 %
Neutro Abs: 10.2 10*3/uL — ABNORMAL HIGH (ref 1.7–7.7)
Neutrophils Relative %: 86 %
PLATELETS: 260 10*3/uL (ref 150–400)
RBC: 4.27 MIL/uL (ref 4.22–5.81)
RDW: 15.1 % (ref 11.5–15.5)
WBC: 11.9 10*3/uL — AB (ref 4.0–10.5)

## 2017-10-07 LAB — I-STAT CG4 LACTIC ACID, ED: LACTIC ACID, VENOUS: 1.73 mmol/L (ref 0.5–1.9)

## 2017-10-07 MED ORDER — IOPAMIDOL (ISOVUE-300) INJECTION 61%
100.0000 mL | Freq: Once | INTRAVENOUS | Status: AC | PRN
  Administered 2017-10-07: 100 mL via INTRAVENOUS

## 2017-10-07 MED ORDER — OXYCODONE HCL 5 MG PO TABS: 5.0000 mg | ORAL_TABLET | ORAL | Status: DC | PRN

## 2017-10-07 MED ORDER — DIPHENHYDRAMINE HCL 50 MG/ML IJ SOLN: 12.5000 mg | Freq: Four times a day (QID) | INTRAMUSCULAR | Status: DC | PRN

## 2017-10-07 MED ORDER — PIPERACILLIN-TAZOBACTAM 3.375 G IVPB 30 MIN
3.3750 g | Freq: Once | INTRAVENOUS | Status: AC
  Administered 2017-10-07: 3.375 g via INTRAVENOUS
  Filled 2017-10-07: qty 50

## 2017-10-07 MED ORDER — ENOXAPARIN SODIUM 40 MG/0.4ML ~~LOC~~ SOLN
40.0000 mg | SUBCUTANEOUS | Status: DC
  Administered 2017-10-07: 40 mg via SUBCUTANEOUS
  Filled 2017-10-07: qty 0.4

## 2017-10-07 MED ORDER — ONDANSETRON HCL 4 MG/2ML IJ SOLN
4.0000 mg | Freq: Once | INTRAMUSCULAR | Status: DC
  Filled 2017-10-07: qty 2

## 2017-10-07 MED ORDER — ONDANSETRON 4 MG PO TBDP: 4.0000 mg | ORAL_TABLET | Freq: Four times a day (QID) | ORAL | Status: DC | PRN

## 2017-10-07 MED ORDER — DIPHENHYDRAMINE HCL 12.5 MG/5ML PO ELIX: 12.5000 mg | ORAL_SOLUTION | Freq: Four times a day (QID) | ORAL | Status: DC | PRN

## 2017-10-07 MED ORDER — HYDROMORPHONE HCL 1 MG/ML IJ SOLN: 1.0000 mg | INTRAMUSCULAR | Status: DC | PRN

## 2017-10-07 MED ORDER — POTASSIUM CHLORIDE IN NACL 20-0.9 MEQ/L-% IV SOLN
INTRAVENOUS | Status: DC
  Administered 2017-10-07: 18:00:00 via INTRAVENOUS
  Filled 2017-10-07 (×2): qty 1000

## 2017-10-07 MED ORDER — PIPERACILLIN-TAZOBACTAM 3.375 G IVPB
3.3750 g | Freq: Three times a day (TID) | INTRAVENOUS | Status: DC
  Administered 2017-10-07 – 2017-10-08 (×2): 3.375 g via INTRAVENOUS
  Filled 2017-10-07 (×3): qty 50

## 2017-10-07 MED ORDER — ONDANSETRON HCL 4 MG/2ML IJ SOLN: 4.0000 mg | Freq: Four times a day (QID) | INTRAMUSCULAR | Status: DC | PRN

## 2017-10-07 NOTE — Telephone Encounter (Signed)
I received a phone call from [facility] radiology about this patient. I am covering for Dr. Fuller Plan as he is out today. Radiology states CT shows perforated sigmoid diverticulitis with 2 areas of 2.5cm each with fluid collection / air, possible abscess.  Almyra Free can you please contact the patient to go to the ER for evaluation, he likely warrants admission.  Thanks

## 2017-10-07 NOTE — ED Triage Notes (Signed)
Pt to ER sent straight from PCP for CT proven perforated diverticulum. Pt is in NAD. States minimal pain. VSS.

## 2017-10-07 NOTE — H&P (Signed)
[facility] Surgery Consult Note  Dean [name] 04-May-1946  [id number].    Requesting MD: Maryan Rued, MD Chief Complaint/Reason for Consult: sigmoid diverticulitis   HPI: 71 y/o male with a PMH gout who presented to [facility] for follow up of an abnormal CT scan ordered by his GI physician. The patient reports 2 years of left-sided abdominal pain, back pain, diarrhea, and 40 lbs weight loss. He was seen by Dr. Fuller Plan on 10/29 for evaluation where multiple labs and a CT abdomen were ordered. The patient was instructed to come to ED when CT revealed perforated sigmoid diverticulitis with two small perforations and surrounding air/fluid. Today he reports intermittent LLQ pain and mucous surrounding his stools. These symptoms are exacerbated by PO intake. Denies alleviating factors. This pain is overall unchanged when compared to the pain that he has been experiencing the past two years. He does endorse recent onset of chills in the evening. He takes medication for his gout but denies use of blood thinning meds. Currently undergoing cardiac workup by Dr. Cathie Olden for PVC's.   Afebrile, VSS WBC 11.9   ROS: Review of Systems  Constitutional: Positive for chills and weight loss.  Respiratory: Negative for cough and hemoptysis.   Cardiovascular: Negative for chest pain.  Gastrointestinal: Positive for abdominal pain and diarrhea.  Musculoskeletal: Positive for back pain.  All other systems reviewed and are negative.   Family History  Problem Relation Age of Onset  . Hyperlipidemia Mother   . Hypertension Mother   . Hyperlipidemia Brother   . Hypertension Brother   . Colon cancer Neg Hx   . Esophageal cancer Neg Hx   . Stomach cancer Neg Hx     Past Medical History:  Diagnosis Date  . Abdominal pain   . Acid reflux   . Alcohol screening   . Arthropathy   . Cardiac risk counseling   . Chest pain   . Colon polyp   . Diarrhea   . Dizziness   . Fatigue   . Gout   . Hematuria   .  Hepatitis A age 78  . Hyperlipemia   . Hypogonadism male   . Knee pain   . Need for shingles vaccine   . Night sweats   . Non compliance with medical treatment   . Palpitations   . Pollen allergies   . Rash   . Screening for HIV (human immunodeficiency virus)   . SOB (shortness of breath)   . Special screening for malignant neoplasm of colon   . Urinary frequency   . Vitamin D deficiency     Past Surgical History:  Procedure Laterality Date  . NONE      Social History:  reports that he has quit smoking. He has never used smokeless tobacco. He reports that he does not drink alcohol or use drugs.  Allergies:  Allergies  Allergen Reactions  . Aspirin Ec [Aspirin]     Will flare up gout     (Not in a hospital admission)  Blood pressure (!) 126/54, pulse 76, temperature 97.9 F (36.6 C), temperature source Oral, resp. rate 18, SpO2 100 %. Physical Exam: Physical Exam  Constitutional: He is oriented to person, place, and time. He appears well-developed and well-nourished. No distress.  HENT:  Head: Normocephalic and atraumatic.  Right Ear: External ear normal.  Left Ear: External ear normal.  Nose: Nose normal.  Eyes: Pupils are equal, round, and reactive to light. EOM are normal. No scleral icterus.  Neck: Normal range  of motion. No tracheal deviation present.  Cardiovascular: Normal rate, regular rhythm, normal heart sounds and intact distal pulses.   No murmur heard. Pulmonary/Chest: Effort normal and breath sounds normal. No stridor. No respiratory distress. He has no wheezes. He has no rales.  Abdominal: Soft. Bowel sounds are normal. He exhibits no distension and no mass. There is tenderness (minimal LLQ tenderness to deep palpation. no rebound or guarding.). There is no rebound and no guarding.  Musculoskeletal: Normal range of motion. He exhibits no edema or deformity.  Neurological: He is alert and oriented to person, place, and time.  Skin: Skin is warm and  dry. No rash noted. He is not diaphoretic.  Psychiatric: He has a normal mood and affect. His behavior is normal.    Results for orders placed or performed during the hospital encounter of 10/07/17 (from the past 48 hour(s))  I-Stat CG4 Lactic Acid, ED     Status: None   Collection Time: 10/07/17  3:58 PM  Result Value Ref Range   Lactic Acid, Venous 1.73 0.5 - 1.9 mmol/L   Ct Abdomen Pelvis W Contrast  Result Date: 10/07/2017 CLINICAL DATA:  Patient with lower pelvic pain.  Diarrhea. EXAM: CT ABDOMEN AND PELVIS WITH CONTRAST TECHNIQUE: Multidetector CT imaging of the abdomen and pelvis was performed using the standard protocol following bolus administration of intravenous contrast. CONTRAST:  163mL ISOVUE-300 IOPAMIDOL (ISOVUE-300) INJECTION 61% COMPARISON:  None. FINDINGS: Lower chest: Normal heart size. Subpleural 3 mm nodule right middle lobe (image 4; series 3). Hepatobiliary: The liver is normal in size and contour. Too small to characterize subcentimeter low-attenuation lesion right hepatic lobe (image 21; series 2). Fatty deposition adjacent to the falciform ligament. Gallbladder is unremarkable. No intrahepatic or extrahepatic biliary ductal dilatation. Pancreas: Unremarkable Spleen: Unremarkable Adrenals/Urinary Tract: Normal adrenal glands. Kidneys enhance symmetrically with contrast. No hydronephrosis. Urinary bladder is decompressed. Probable 2 mm nonobstructing stone inferior pole left kidney (image 59; series 5). Stomach/Bowel: Short segment marked inflammatory thickening and stranding of the sigmoid colon most compatible with acute diverticulitis. There are focal areas of contained perforation which result in a 2.7 x 1.7 cm air and fluid collection cranial to the sigmoid colon and a 2.9 cm air-fluid collection anterior to the sigmoid colon (image 72; series 2). No evidence for upstream bowel obstruction. 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 Aortic  atherosclerosis. No retroperitoneal lymphadenopathy. The right common iliac artery is dilated measuring 2.2 cm and the left common iliac artery is dilated measuring 2.1 cm. Reproductive: Prostate unremarkable. The testicles appear retracted bilaterally. Probable right hydrocele. Other: Bilateral fat containing inguinal hernias. Musculoskeletal: Lumbar spine degenerative changes. No aggressive or acute appearing osseous lesions. IMPRESSION: 1. Findings compatible with focally perforated (contained) acute sigmoid diverticulitis with adjacent air and gas fluid collections superior and anterior to the inflamed sigmoid colon measuring 2.7 and 2.9 cm. 2. 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 Recommend followup by ultrasound in 5 years. This recommendation follows ACR consensus guidelines: White Paper of the ACR Incidental Findings Committee II on Vascular Findings. J Am Coll Radiol 2013; 10:789-794. 3. These results were called by telephone at the time of interpretation on 10/07/2017 at 1:56 pm to Dr. Havery Moros, who verbally acknowledged these results. Electronically Signed   By: Lovey Newcomer M.D.   On: 10/07/2017 14:02    Assessment/Plan Sigmoid diverticulitis with perforation  - clinically the patients exam is very benign but, given the presence of  mild leukocytosis and recent history of chills, recommend admission for 24 hours of IV antibiotics and repeat exam/CBC in AM.  - allow clear liquid diet - IV Zosyn - mobilize  - If symptoms worsen will plan to call GI for consult during this hospital admission. If patient improves will plan for hospital discharge with PO abx and outpatient follow up with GI as the patient already has an appointment with them in 2 weeks.   Gout - continue home meds FEN - clears ID - Zosyn 11/2 >>  VTE - SCD's, lovenox   Jill Alexanders, [facility] Surgery 10/07/2017, 4:02 PM Pager:  [phone] Consults: [phone] Mon-Fri 7:00 am-4:30 pm Sat-Sun 7:00 am-11:30 am

## 2017-10-07 NOTE — ED Provider Notes (Signed)
[facility] Provider Note   CSN: [id number] Arrival date & time: 10/07/17  1419     History   Chief Complaint Chief Complaint  Patient presents with  . Abdominal Pain    HPI Dean [name] is a 71 y.o. male w PMHx gout and GERD, presenting to the ED per [facility] with abdominal CT results showing perforated sigmoid diverticulitis with adjacent air and gas fluid collections. Pt states he has been having bowel issues intermittently for 2 years now and was evaluated by GI. He states he has been having irregular stools with mucus and significantly decreased appetite. Also reports some nausea and mild abdominal pain rated 2/10. States he has had intermittent chills. Denies blood in stool, urinary sx, fever, or other complaints. No recent antibiotics or allergies to antibiotics. No previous diagnosis of diverticulitis. Reports normal colonoscopy about 4 years ago.  The history is provided by the patient.    Past Medical History:  Diagnosis Date  . Abdominal pain   . Acid reflux   . Alcohol screening   . Arthropathy   . Cardiac risk counseling   . Chest pain   . Colon polyp   . Diarrhea   . Dizziness   . Fatigue   . Gout   . Hematuria   . Hepatitis A age 54  . Hyperlipemia   . Hypogonadism male   . Knee pain   . Need for shingles vaccine   . Night sweats   . Non compliance with medical treatment   . Palpitations   . Pollen allergies   . Rash   . Screening for HIV (human immunodeficiency virus)   . SOB (shortness of breath)   . Special screening for malignant neoplasm of colon   . Urinary frequency   . Vitamin D deficiency     Patient Active Problem List   Diagnosis Date Noted  . Diverticulitis of colon with perforation 10/07/2017  . Diarrhea 10/03/2017  . Iron deficiency anemia 10/03/2017  . Loss of weight 10/03/2017  . Left sided abdominal pain of unknown cause 10/03/2017  . Abnormal EKG 07/25/2017  . Chest pain 07/25/2017     Past Surgical History:  Procedure Laterality Date  . NONE         Home Medications    Prior to Admission medications   Medication Sig Start Date End Date Taking? Authorizing Provider  Cholecalciferol (VITAMIN D3) 5000 units CAPS Take by mouth daily.   Yes [provider]  febuxostat (ULORIC) 40 MG tablet Take 40 mg by mouth daily.   Yes [provider]  indomethacin (INDOCIN) 50 MG capsule Take 50 mg by mouth as directed. Take 50 mg by mouth as needed at the first sign of gout 03/02/17  Yes [provider]    Family History Family History  Problem Relation Age of Onset  . Hyperlipidemia Mother   . Hypertension Mother   . Hyperlipidemia Brother   . Hypertension Brother   . Colon cancer Neg Hx   . Esophageal cancer Neg Hx   . Stomach cancer Neg Hx     Social History Social History  Substance Use Topics  . Smoking status: Former Research scientist (life sciences)  . Smokeless tobacco: Never Used  . Alcohol use No     Allergies   Aspirin ec [aspirin]   Review of Systems Review of Systems  Constitutional: Positive for appetite change and chills. Negative for fever.  Gastrointestinal: Positive for abdominal pain and nausea. Negative  for blood in stool, constipation and vomiting.  Genitourinary: Negative for dysuria and frequency.  Allergic/Immunologic: Negative for immunocompromised state.  All other systems reviewed and are negative.    Physical Exam Updated Vital Signs BP (!) 143/82   Pulse 79   Temp 97.9 F (36.6 C) (Oral)   Resp 17   SpO2 100%   Physical Exam  Constitutional: He appears well-developed and well-nourished. No distress.  Well-appearing, resting comfortably.  HENT:  Head: Normocephalic and atraumatic.  Mouth/Throat: Oropharynx is clear and moist.  Eyes: Conjunctivae are normal.  Cardiovascular: Normal rate, regular rhythm, normal heart sounds and intact distal pulses.  Exam reveals no gallop and no friction rub.   No murmur  heard. Pulmonary/Chest: Effort normal and breath sounds normal. No respiratory distress. He has no wheezes. He has no rales.  Abdominal: Soft. Normal appearance and bowel sounds are normal. He exhibits no distension. There is tenderness in the right lower quadrant and left lower quadrant. There is no rigidity, no rebound and no guarding. No hernia.  Neurological: He is alert.  Skin: Skin is warm.  Psychiatric: He has a normal mood and affect. His behavior is normal.  Nursing note and vitals reviewed.    ED Treatments / Results  Labs (all labs ordered are listed, but only abnormal results are displayed) Labs Reviewed  CBC WITH DIFFERENTIAL/PLATELET - Abnormal; Notable for the following:       Result Value   WBC 11.9 (*)    Hemoglobin 11.7 (*)    HCT 36.0 (*)    Neutro Abs 10.2 (*)    All other components within normal limits  COMPREHENSIVE METABOLIC PANEL  I-STAT CG4 LACTIC ACID, ED    EKG  EKG Interpretation None       Radiology Ct Abdomen Pelvis W Contrast  Result Date: 10/07/2017 CLINICAL DATA:  Patient with lower pelvic pain.  Diarrhea. EXAM: CT ABDOMEN AND PELVIS WITH CONTRAST TECHNIQUE: Multidetector CT imaging of the abdomen and pelvis was performed using the standard protocol following bolus administration of intravenous contrast. CONTRAST:  150mL ISOVUE-300 IOPAMIDOL (ISOVUE-300) INJECTION 61% COMPARISON:  None. FINDINGS: Lower chest: Normal heart size. Subpleural 3 mm nodule right middle lobe (image 4; series 3). Hepatobiliary: The liver is normal in size and contour. Too small to characterize subcentimeter low-attenuation lesion right hepatic lobe (image 21; series 2). Fatty deposition adjacent to the falciform ligament. Gallbladder is unremarkable. No intrahepatic or extrahepatic biliary ductal dilatation. Pancreas: Unremarkable Spleen: Unremarkable Adrenals/Urinary Tract: Normal adrenal glands. Kidneys enhance symmetrically with contrast. No hydronephrosis. Urinary  bladder is decompressed. Probable 2 mm nonobstructing stone inferior pole left kidney (image 59; series 5). Stomach/Bowel: Short segment marked inflammatory thickening and stranding of the sigmoid colon most compatible with acute diverticulitis. There are focal areas of contained perforation which result in a 2.7 x 1.7 cm air and fluid collection cranial to the sigmoid colon and a 2.9 cm air-fluid collection anterior to the sigmoid colon (image 72; series 2). No evidence for upstream bowel obstruction. 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 Aortic atherosclerosis. No retroperitoneal lymphadenopathy. The right common iliac artery is dilated measuring 2.2 cm and the left common iliac artery is dilated measuring 2.1 cm. Reproductive: Prostate unremarkable. The testicles appear retracted bilaterally. Probable right hydrocele. Other: Bilateral fat containing inguinal hernias. Musculoskeletal: Lumbar spine degenerative changes. No aggressive or acute appearing osseous lesions. IMPRESSION: 1. Findings compatible with focally perforated (contained) acute sigmoid diverticulitis with adjacent air and gas fluid collections superior and anterior  to the inflamed sigmoid colon measuring 2.7 and 2.9 cm. 2. 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 Recommend followup by ultrasound in 5 years. This recommendation follows ACR consensus guidelines: White Paper of the ACR Incidental Findings Committee II on Vascular Findings. J Am Coll Radiol 2013; 10:789-794. 3. These results were called by telephone at the time of interpretation on 10/07/2017 at 1:56 pm to Dr. Havery Moros, who verbally acknowledged these results. Electronically Signed   By: Lovey Newcomer M.D.   On: 10/07/2017 14:02    Procedures Procedures (including critical care time)  Medications Ordered in ED Medications  piperacillin-tazobactam (ZOSYN) IVPB 3.375 g (3.375 g  Intravenous New Bag/Given 10/07/17 1627)  ondansetron (ZOFRAN) injection 4 mg (4 mg Intravenous Refused 10/07/17 1634)     Initial Impression / Assessment and Plan / ED Course  I have reviewed the triage vital signs and the nursing notes.  Pertinent labs & imaging results that were available during my care of the patient were reviewed by me and considered in my medical decision making (see chart for details).  Clinical Course as of Oct 08 1639  Fri Oct 07, 2017  1553 Spoke with Melina Modena, PA-C with general surgery; will see patient  [JR]  1621 General surgery, Dr. Ninfa Linden, to admit  [JR]    Clinical Course User Index [JR] Akela Pocius, [place] N, PA-C    Patient presenting with positive CT scan showing acute perforated diverticulitis with adjacent air and gas fluid collections.  Patient reporting minimal pain in the ED, is well-appearing and hemodynamically stable.  Afebrile, nontoxic.  White count slightly elevated at 11.9.  Hemoglobin 11.7.  Lactic within normal limits.  Zosyn administered.  General surgery consulted, spoke with Melina Modena, PA-C; who saw patient and patient will be admitted under Dr. Ninfa Linden.  Patient discussed with and seen by Dr. Maryan Rued.  The patient appears reasonably stabilized for admission considering the current resources, flow, and capabilities available in the ED at this time, and I doubt any other [facility] requiring further screening and/or treatment in the ED prior to admission.  Final Clinical Impressions(s) / ED Diagnoses   Final diagnoses:  Diverticulitis of large intestine with perforation, unspecified bleeding status    New Prescriptions New Prescriptions   No medications on file     Marica Trentham, [place] N, PA-C 10/07/17 1641    Blanchie Dessert, MD 10/10/17 [phone]

## 2017-10-07 NOTE — Progress Notes (Signed)
Newly admitted pt from ED declined SCDs ,educated on importance of VTE prophylaxis and still declined at this time

## 2017-10-07 NOTE — Telephone Encounter (Signed)
Thanks Almyra Free.  Moshe Cipro

## 2017-10-07 NOTE — Telephone Encounter (Signed)
Spoke to patient, advised of results, he is still at [place] office, he will go to [facility] ED now.

## 2017-10-08 LAB — CBC
HEMATOCRIT: 30.6 % — AB (ref 39.0–52.0)
HEMOGLOBIN: 9.7 g/dL — AB (ref 13.0–17.0)
MCH: 26.7 pg (ref 26.0–34.0)
MCHC: 31.7 g/dL (ref 30.0–36.0)
MCV: 84.3 fL (ref 78.0–100.0)
Platelets: 226 10*3/uL (ref 150–400)
RBC: 3.63 MIL/uL — ABNORMAL LOW (ref 4.22–5.81)
RDW: 15.2 % (ref 11.5–15.5)
WBC: 5.4 10*3/uL (ref 4.0–10.5)

## 2017-10-08 MED ORDER — METRONIDAZOLE 500 MG PO TABS: 500.0000 mg | ORAL_TABLET | Freq: Three times a day (TID) | ORAL | 0 refills | Status: DC

## 2017-10-08 MED ORDER — CIPROFLOXACIN HCL 500 MG PO TABS: 500.0000 mg | ORAL_TABLET | Freq: Two times a day (BID) | ORAL | 0 refills | Status: DC

## 2017-10-08 MED ORDER — METRONIDAZOLE 500 MG PO TABS: 500.0000 mg | ORAL_TABLET | Freq: Three times a day (TID) | ORAL | 0 refills | Status: AC

## 2017-10-08 NOTE — Progress Notes (Signed)
Discharge instructions gone over with patient. Home medications discussed. Prescriptions given and faxed. Diet, activity, and reasons to call the doctor discussed. Follow up appointments are scheduled. Patient verbalized understanding of instructions.

## 2017-10-08 NOTE — Discharge Instructions (Signed)

## 2017-10-08 NOTE — Progress Notes (Addendum)
Subjective/Chief Complaint: No acute events. Slept poorly. No pain.    Objective: Vital signs in last 24 hours: Temp:  [97.7 F (36.5 C)-98.4 F (36.9 C)] 97.9 F (36.6 C) (11/03 0603) Pulse Rate:  [61-95] 61 (11/03 0603) Resp:  [16-18] 18 (11/03 0603) BP: (126-144)/(54-90) 144/69 (11/03 0603) SpO2:  [99 %-100 %] 99 % (11/03 0603) Last BM Date: 10/07/17  Intake/Output from previous day: 11/02 0701 - 11/03 0700 In: 1545 [P.O.:360; I.V.:1085; IV Piggyback:100] Out: -  Intake/Output this shift: No intake/output data recorded.  General appearance: alert and cooperative Resp: clear to auscultation bilaterally GI: soft, non-tender; bowel sounds normal; no masses,  no organomegaly Skin: Skin color, texture, turgor normal. No rashes or lesions Neurologic: Grossly normal  Lab Results:   Recent Labs  10/07/17 1541 10/08/17 0628  WBC 11.9* 5.4  HGB 11.7* 9.7*  HCT 36.0* 30.6*  PLT 260 226   BMET  Recent Labs  10/07/17 1541  NA 132*  K 3.9  CL 101  CO2 21*  GLUCOSE 117*  BUN 15  CREATININE 1.37*  CALCIUM 9.1   PT/INR No results for input(s): LABPROT, INR in the last 72 hours. ABG No results for input(s): PHART, HCO3 in the last 72 hours.  Invalid input(s): PCO2, PO2  Studies/Results: Ct Abdomen Pelvis W Contrast  Result Date: 10/07/2017 CLINICAL DATA:  Patient with lower pelvic pain.  Diarrhea. EXAM: CT ABDOMEN AND PELVIS WITH CONTRAST TECHNIQUE: Multidetector CT imaging of the abdomen and pelvis was performed using the standard protocol following bolus administration of intravenous contrast. CONTRAST:  159mL ISOVUE-300 IOPAMIDOL (ISOVUE-300) INJECTION 61% COMPARISON:  None. FINDINGS: Lower chest: Normal heart size. Subpleural 3 mm nodule right middle lobe (image 4; series 3). Hepatobiliary: The liver is normal in size and contour. Too small to characterize subcentimeter low-attenuation lesion right hepatic lobe (image 21; series 2). Fatty deposition adjacent  to the falciform ligament. Gallbladder is unremarkable. No intrahepatic or extrahepatic biliary ductal dilatation. Pancreas: Unremarkable Spleen: Unremarkable Adrenals/Urinary Tract: Normal adrenal glands. Kidneys enhance symmetrically with contrast. No hydronephrosis. Urinary bladder is decompressed. Probable 2 mm nonobstructing stone inferior pole left kidney (image 59; series 5). Stomach/Bowel: Short segment marked inflammatory thickening and stranding of the sigmoid colon most compatible with acute diverticulitis. There are focal areas of contained perforation which result in a 2.7 x 1.7 cm air and fluid collection cranial to the sigmoid colon and a 2.9 cm air-fluid collection anterior to the sigmoid colon (image 72; series 2). No evidence for upstream bowel obstruction. 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 Aortic atherosclerosis. No retroperitoneal lymphadenopathy. The right common iliac artery is dilated measuring 2.2 cm and the left common iliac artery is dilated measuring 2.1 cm. Reproductive: Prostate unremarkable. The testicles appear retracted bilaterally. Probable right hydrocele. Other: Bilateral fat containing inguinal hernias. Musculoskeletal: Lumbar spine degenerative changes. No aggressive or acute appearing osseous lesions. IMPRESSION: 1. Findings compatible with focally perforated (contained) acute sigmoid diverticulitis with adjacent air and gas fluid collections superior and anterior to the inflamed sigmoid colon measuring 2.7 and 2.9 cm. 2. 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 Recommend followup by ultrasound in 5 years. This recommendation follows ACR consensus guidelines: White Paper of the ACR Incidental Findings Committee II on Vascular Findings. J Am Coll Radiol 2013; 10:789-794. 3. These results were called by telephone at the time of interpretation on 10/07/2017 at 1:56 pm to Dr.  Havery Moros, who verbally acknowledged these results. Electronically  Signed   By: Lovey Newcomer M.D.   On: 10/07/2017 14:02    Anti-infectives: Anti-infectives    Start     Dose/Rate Route Frequency Ordered Stop   10/07/17 2300  piperacillin-tazobactam (ZOSYN) IVPB 3.375 g     3.375 g 12.5 mL/hr over 240 Minutes Intravenous Every 8 hours 10/07/17 1750     10/07/17 1530  piperacillin-tazobactam (ZOSYN) IVPB 3.375 g     3.375 g 100 mL/hr over 30 Minutes Intravenous  Once 10/07/17 1523 10/07/17 1657      Assessment/Plan: s/p * No surgery found * He is adamant to go home today. Will dc on cipro.flagyl. We had a long discussion about return precautions and need for c-scope in 6-8 wks    LOS: 0 days    Dean [name] 10/08/2017

## 2017-10-08 NOTE — Discharge Summary (Signed)
Physician Discharge Summary  Patient ID: Dean [name] MRN: [id number] DOB/AGE: 02/09/1946 71 y.o.  Admit date: 10/07/2017 Discharge date: 10/08/2017  Admission Diagnoses:  Discharge Diagnoses:  Active Problems:   Diverticulitis of colon with perforation   Discharged Condition: good  Hospital Course: Admitted with diverticulitis with microperf. On HD 2 his WBC was normal. He was toelrating a diet and he had no pain. He was adamant about discharge and understands return precautions.   Consults: None  Significant Diagnostic Studies: see epic  Treatments: antibiotics: Zosyn  Discharge Exam: Blood pressure (!) 144/69, pulse 61, temperature 97.9 F (36.6 C), temperature source Oral, resp. rate 18, SpO2 99 %. see rounding note  Disposition: Final discharge disposition not confirmed  Discharge Instructions    Increase activity slowly    Complete by:  As directed      Allergies as of 10/08/2017      Reactions   Aspirin Ec [aspirin] Other (See Comments)   Will flare up gout      Medication List    TAKE these medications   ciprofloxacin 500 MG tablet Commonly known as:  CIPRO Take 1 tablet (500 mg total) by mouth 2 (two) times daily.   febuxostat 40 MG tablet Commonly known as:  ULORIC Take 40 mg by mouth daily.   indomethacin 50 MG capsule Commonly known as:  INDOCIN Take 50 mg by mouth as directed. Take 50 mg by mouth as needed at the first sign of gout   metroNIDAZOLE 500 MG tablet Commonly known as:  FLAGYL Take 1 tablet (500 mg total) by mouth 3 (three) times daily.   Vitamin D3 5000 units Caps Take by mouth daily.      Follow-up Information    [name], PA-C Follow up in 1 day(s).   Specialty:  Physician Assistant Contact information: [place] [address] [phone]        Yetta Flock, MD Follow up in 6 week(s).   Specialty:  Gastroenterology Contact information: [address] [place]  [address] [phone]           Signed: Clovis Riley 10/08/2017, 11:18 AM

## 2017-10-13 LAB — TISSUE TRANSGLUTAMINASE, IGA: (tTG) Ab, IgA: 1 U/mL

## 2017-10-13 LAB — GASTROINTESTINAL PATHOGEN PANEL PCR
C. difficile Tox A/B, PCR: NOT DETECTED
Campylobacter, PCR: NOT DETECTED
Cryptosporidium, PCR: NOT DETECTED
E COLI (ETEC) LT/ST, PCR: NOT DETECTED
E COLI (STEC) STX1/STX2, PCR: NOT DETECTED
E COLI 0157, PCR: NOT DETECTED
Giardia lamblia, PCR: NOT DETECTED
NOROVIRUS, PCR: NOT DETECTED
Rotavirus A, PCR: NOT DETECTED
SALMONELLA, PCR: NOT DETECTED
SHIGELLA, PCR: NOT DETECTED

## 2017-10-13 LAB — PANCREATIC ELASTASE, FECAL

## 2017-10-14 ENCOUNTER — Telehealth: Payer: Self-pay | Admitting: Gastroenterology

## 2017-10-14 NOTE — Telephone Encounter (Signed)
Left message on machine to call back  

## 2017-10-14 NOTE — Telephone Encounter (Signed)
The pt has been advised to follow up with Dr Windle Guard about side effect symptoms.  The pt agreed and states he will call Dr Conners office.

## 2017-10-14 NOTE — Telephone Encounter (Signed)
Doc of the Day Post hospitalization for microperforation large intestine. He is on Cipro and Flagyl. Cipro is for 10 days. Flagyl is for 14 days. Is this correct? Patient is complaining of taste aversion and nausea. Afebrile. Tolerating soft diet. Regular bowel movements described as soft. Abdomen feels "sensitive, but passing gas helps." Please advise on antibiotics. Declines any medication for nausea.

## 2017-10-14 NOTE — Telephone Encounter (Signed)
Dr. Windle Guard iof surgery prescribed that - he should check with her about duration - would think duration would have been same for both  The symptoms he is having sound like side effects of antibiotics   Does he have a follow-up with [place] or surgery?

## 2017-10-21 ENCOUNTER — Other Ambulatory Visit (INDEPENDENT_AMBULATORY_CARE_PROVIDER_SITE_OTHER): Payer: Medicare Other

## 2017-10-21 ENCOUNTER — Ambulatory Visit: Payer: Medicare Other | Admitting: Physician Assistant

## 2017-10-21 ENCOUNTER — Encounter: Payer: Self-pay | Admitting: Physician Assistant

## 2017-10-21 ENCOUNTER — Ambulatory Visit (INDEPENDENT_AMBULATORY_CARE_PROVIDER_SITE_OTHER)
Admission: RE | Admit: 2017-10-21 | Discharge: 2017-10-21 | Disposition: A | Discharge status: Home / Self Care | Payer: Medicare Other | Source: Ambulatory Visit | Attending: Physician Assistant | Admitting: Physician Assistant

## 2017-10-21 VITALS — BP 124/64 | HR 64 | Ht 69.5 in | Wt 165.0 lb

## 2017-10-21 DIAGNOSIS — K5732 Diverticulitis of large intestine without perforation or abscess without bleeding: Secondary | ICD-10-CM

## 2017-10-21 DIAGNOSIS — R1032 Left lower quadrant pain: Secondary | ICD-10-CM

## 2017-10-21 DIAGNOSIS — K5741 Diverticulitis of both small and large intestine with perforation and abscess with bleeding: Secondary | ICD-10-CM

## 2017-10-21 DIAGNOSIS — D509 Iron deficiency anemia, unspecified: Secondary | ICD-10-CM | POA: Diagnosis not present

## 2017-10-21 DIAGNOSIS — M79605 Pain in left leg: Secondary | ICD-10-CM | POA: Diagnosis not present

## 2017-10-21 DIAGNOSIS — R1031 Right lower quadrant pain: Secondary | ICD-10-CM

## 2017-10-21 LAB — CBC WITH DIFFERENTIAL/PLATELET
BASOS ABS: 0 10*3/uL (ref 0.0–0.1)
Basophils Relative: 0.4 % (ref 0.0–3.0)
EOS ABS: 0.1 10*3/uL (ref 0.0–0.7)
Eosinophils Relative: 1.9 % (ref 0.0–5.0)
HCT: 36 % — ABNORMAL LOW (ref 39.0–52.0)
HEMOGLOBIN: 11.4 g/dL — AB (ref 13.0–17.0)
Lymphocytes Relative: 27.2 % (ref 12.0–46.0)
Lymphs Abs: 2.2 10*3/uL (ref 0.7–4.0)
MCHC: 31.7 g/dL (ref 30.0–36.0)
MCV: 85.6 fl (ref 78.0–100.0)
MONO ABS: 0.6 10*3/uL (ref 0.1–1.0)
Monocytes Relative: 7.5 % (ref 3.0–12.0)
NEUTROS PCT: 63 % (ref 43.0–77.0)
Neutro Abs: 5.1 10*3/uL (ref 1.4–7.7)
Platelets: 346 10*3/uL (ref 150.0–400.0)
RBC: 4.2 Mil/uL — AB (ref 4.22–5.81)
RDW: 16.9 % — ABNORMAL HIGH (ref 11.5–15.5)
WBC: 8 10*3/uL (ref 4.0–10.5)

## 2017-10-21 LAB — SEDIMENTATION RATE: SED RATE: 91 mm/h — AB (ref 0–20)

## 2017-10-21 LAB — BASIC METABOLIC PANEL
BUN: 17 mg/dL (ref 6–23)
CALCIUM: 9 mg/dL (ref 8.4–10.5)
CHLORIDE: 104 meq/L (ref 96–112)
CO2: 26 mEq/L (ref 19–32)
CREATININE: 1.22 mg/dL (ref 0.40–1.50)
GFR: 62.48 mL/min (ref >60.00)
Glucose, Bld: 108 mg/dL — ABNORMAL HIGH (ref 70–99)
Potassium: 4 mEq/L (ref 3.5–5.1)
Sodium: 138 mEq/L (ref 135–145)

## 2017-10-21 MED ORDER — AMOXICILLIN-POT CLAVULANATE 875-125 MG PO TABS: 1.0000 | ORAL_TABLET | Freq: Two times a day (BID) | ORAL | 0 refills | Status: DC

## 2017-10-21 MED ORDER — IOPAMIDOL (ISOVUE-300) INJECTION 61%
100.0000 mL | Freq: Once | INTRAVENOUS | Status: AC | PRN
  Administered 2017-10-21: 100 mL via INTRAVENOUS

## 2017-10-21 NOTE — Progress Notes (Signed)
Reviewed and agree with management plan.  Kdyn Vonbehren T. Hollis Oh, MD FACG 

## 2017-10-21 NOTE — Progress Notes (Addendum)
Subjective:    Patient ID: Dean [name], male    DOB: Feb 13, 1946, 71 y.o.   MRN: [id number]  HPI Dean [name] is a 71 year old white male, recently established with Dean [name] and seen in the office on 10/03/2017. He had complaints of diarrhea, mucus per rectum, weight loss iron deficiency anemia and intermittent left-sided abdominal pain, associated with fatigue and decrease in appetite. He had had a previous colonoscopy done at [facility] by Dean [name] 3-4 years ago and was told this was negative other than some diverticulosis. Patient underwent CT scan of the abdomen and pelvis on 10/07/2017 for further evaluation and was noted to have marked inflammatory thickening and stranding of the sigmoid colon consistent with acute diverticulitis ,focally perforated, with the adjacent area and gas fluid collections superior and anterior to the inflamed sigmoid colon measuring 2.7 and 2.9 cm. Also noted aorta atelectatic at 2.9 cm. Patient was advised to proceed to the emergency room and was admitted to the surgery service. She was kept overnight on Zosyn and then by notes apparently was adamant about being discharged and was allowed discharged to home the following morning on a ten-day course of Cipro and 14 day course of metronidazole. He says that he had a lot of side effects with the metronidazole and stopped taking it 2 days orally. He did complete the Cipro. Comes in today for follow-up stating that he is not having any significant abdominal discomfort. However he did not have any significant abdominal discomfort when he was admitted. He says his energy level remains poor. He may have had some low-grade fevers at home but no high fevers or chills. He has been able to eat, no nausea. Stools have been soft. He thinks the drainage from his rectum is improved. He is having pain in his ankles which she is worried is coming from taking Cipro and also has a "charley horses" feeling in his calves left  much greater than right.  Review of Systems Pertinent positive and negative review of systems were noted in the above HPI section.  All other review of systems was otherwise negative.  Outpatient Encounter Medications as of 10/21/2017  Medication Sig  . Cholecalciferol (VITAMIN D3) 5000 units CAPS Take by mouth daily.  . febuxostat (ULORIC) 40 MG tablet Take 40 mg by mouth daily.  . indomethacin (INDOCIN) 50 MG capsule Take 50 mg by mouth as directed. Take 50 mg by mouth as needed at the first sign of gout  . amoxicillin-clavulanate (AUGMENTIN) 875-125 MG tablet Take 1 tablet 2 (two) times daily for 14 days by mouth.  . [DISCONTINUED] amoxicillin-clavulanate (AUGMENTIN) 875-125 MG tablet Take 1 tablet 2 (two) times daily for 14 days by mouth.  . [DISCONTINUED] ciprofloxacin (CIPRO) 500 MG tablet Take 1 tablet (500 mg total) by mouth 2 (two) times daily.   No facility-administered encounter medications on file as of 10/21/2017.    No Active Allergies Patient Active Problem List   Diagnosis Date Noted  . Diverticulitis of colon with perforation 10/07/2017  . Diarrhea 10/03/2017  . Iron deficiency anemia 10/03/2017  . Loss of weight 10/03/2017  . Left sided abdominal pain of unknown cause 10/03/2017  . Abnormal EKG 07/25/2017  . Chest pain 07/25/2017   Social History   Socioeconomic History  . Marital status: Single    Spouse name: Not on file  . Number of children: Not on file  . Years of education: Not on file  . Highest education level: Not on file  Social Needs  . Financial resource strain: Not on file  . Food insecurity - worry: Not on file  . Food insecurity - inability: Not on file  . Transportation needs - medical: Not on file  . Transportation needs - non-medical: Not on file  Occupational History  . Occupation: retired  Tobacco Use  . Smoking status: Former Research scientist (life sciences)  . Smokeless tobacco: Never Used  Substance and Sexual Activity  . Alcohol use: Yes    Alcohol/week:  0.0 oz    Comment: once monthly  . Drug use: No  . Sexual activity: Not on file  Other Topics Concern  . Not on file  Social History Narrative  . Not on file    [name]'s family history includes Alzheimer's disease in his maternal grandmother and mother; Hyperlipidemia in his brother and mother.      Objective:    Vitals:   10/21/17 1102  BP: 124/64  Pulse: 64    Physical Exam  well-developed older white male in no acute distress, pleasant blood pressure 124/64, pulse 64, height 5 foot 9, weight 165, BMI of 24.0. HEENT; nontraumatic normocephalic EOMI PERRLA sclera anicteric, Cardiovascular; regular rate and rhythm with S1-S2 no murmur or gallop, Pulmonary ;clear bilaterally, Abdomen ;soft, bowel sounds are present there is no palpable mass or hepatosplenomegaly, he has some tenderness in the left lower quadrant no guarding or rebound, no mass, Rectal ;exam not done, Extremities ;no clubbing cyanosis or edema, he has some tenderness in the left calf, no appreciable edema and no cord, Neuropsych ;mood and affect appropriate       Assessment & [name]:   #66 71 year old white male with 2 year history of complaints of intermittent left-sided abdominal pain, decrease in appetite and weight loss mucoid drainage from the rectum and fatigue.  CT on 11/ 2 after his initial evaluation here revealed severe sigmoid diverticulitis with focal perforation and 2 small abscesses. Patient was hospitalized, kept overnight and evaluated by surgery and allow discharge to home the following day to complete a 10 day course of Cipro and 14 day course of metronidazole.  He continues to have some left-sided abdominal discomfort and energy level remains poor. I suspect that he has persistent diverticulitis and may have persistent abscesses.  #2 mild iron deficiency anemia #3 history of tophaceous gout #4 new complaint of left calf pain-rule out DVT #5 recent bilateral ankle pain-etiology not clear  ,patient concerned about this being antibiotic-induced  [name]; CBC with differential, sedimentation rate, be met Patient will be scheduled for CT scan of the abdomen and pelvis today if possible to reevaluate the abscesses. Will start Augmentin 875 mg by mouth twice a day 14 days, patient aware that if abscesses or persistent he may require readmission and/or longer-term IV antibiotics Will schedule for Doppler ultrasound of the left lower extremity Long discussion with the patient today regarding management of complicated diverticulitis, and advised that generally surgery is recommended for segmental resection semi-electively if patient can resolve the acute episode without requiring surgery. He will likely need a follow-up colonoscopy done prior to surgery, again if he can resolve this episode.   Addendum- sedimentation rate 91, WBC remains normal CT abdomen and pelvis done late this afternoon shows no significant interval change since last CT of 10/07/2017 with 2 extraluminal collections of air and fluid with thick circumferential walls corresponding to the previously described sites of contained perforation, of note the wall of the more anterior inferior collection extends inferiorly to the dome of  the bladder possibly contiguous with the bladder and there is associated thickening of the walls of the superior anterior bladder This collection measures 2.9 cm, the other fluid collection measures 3.2 cm.  I spoke with the patient by phone, and he is advised to proceed to the hospital/emergency room for admission for persistent diverticular abscesses. He will need at least 4-5 days of IV antibiotics and then repeat imaging prior to discharge.Marland Kitchen He may also benefit from ID consultation to help coordinate continued IV antibiotics on discharge., Depending on his course.    Amy S Esterwood PA-C 10/21/2017   Cc: [name], PA-C

## 2017-10-21 NOTE — Patient Instructions (Addendum)
Please go to the basement level to have your labs drawn.  We sent a prescription for Augmentin 875/125 mg to your pharmacy.  We have also printed the prescription.  We will call you with the Doppler US of the leg.    You have been scheduled for a CT scan of the abdomen and pelvis at [facility] ([address]---this is in the same building as Press [facility]).   You are scheduled today 11-16 at 3:15 pm. You should arrive 3:00 PM to your appointment time for registration. Please follow the written instructions below on the day of your exam:  WARNING: IF YOU ARE ALLERGIC TO IODINE/X-RAY DYE, PLEASE NOTIFY RADIOLOGY IMMEDIATELY AT [phone]! YOU WILL BE GIVEN A 13 HOUR PREMEDICATION PREP.  1) Do not eat  anything after 11:00 am (4 hours prior to your test) 2) You have been given 2 bottles of oral contrast to drink. The solution may taste               better if refrigerated, but do NOT add ice or any other liquid to this solution. Shake             well before drinking.    Drink 1 bottle of contrast @ 1:15 PM  (2 hours prior to your exam)  Drink 1 bottle of contrast @ 2:15 PM (1 hour prior to your exam)  You may take any medications as prescribed with a small amount of water except for the following: Metformin, Glucophage, Glucovance, Avandamet, Riomet, Fortamet, Actoplus Met, Janumet, Glumetza or Metaglip. The above medications must be held the day of the exam AND 48 hours after the exam.  The purpose of you drinking the oral contrast is to aid in the visualization of your intestinal tract. The contrast solution may cause some diarrhea. Before your exam is started, you will be given a small amount of fluid to drink. Depending on your individual set of symptoms, you may also receive an intravenous injection of x-ray contrast/dye. Plan on being at [facility] for 30 minutes or long, depending on the type of exam you are having performed.  If you have any questions  regarding your exam or if you need to reschedule, you may call the CT department at [phone] between the hours of 8:00 am and 5:00 pm, Monday-Friday.  ________________________________________________________________________

## 2017-10-22 ENCOUNTER — Other Ambulatory Visit: Payer: Self-pay

## 2017-10-22 ENCOUNTER — Emergency Department (HOSPITAL_COMMUNITY)
Admission: EM | Admit: 2017-10-22 | Discharge: 2017-10-22 | Disposition: A | Discharge status: Home / Self Care | Payer: Medicare Other | Source: Home / Self Care | Attending: Emergency Medicine | Admitting: Emergency Medicine

## 2017-10-22 ENCOUNTER — Encounter (HOSPITAL_COMMUNITY): Payer: Self-pay | Admitting: Emergency Medicine

## 2017-10-22 DIAGNOSIS — Z79899 Other long term (current) drug therapy: Secondary | ICD-10-CM

## 2017-10-22 DIAGNOSIS — K5792 Diverticulitis of intestine, part unspecified, without perforation or abscess without bleeding: Secondary | ICD-10-CM

## 2017-10-22 DIAGNOSIS — I214 Non-ST elevation (NSTEMI) myocardial infarction: Secondary | ICD-10-CM | POA: Diagnosis not present

## 2017-10-22 DIAGNOSIS — Z87891 Personal history of nicotine dependence: Secondary | ICD-10-CM | POA: Insufficient documentation

## 2017-10-22 LAB — COMPREHENSIVE METABOLIC PANEL
ALBUMIN: 3.1 g/dL — AB (ref 3.5–5.0)
ALT: 7 U/L — AB (ref 17–63)
AST: 19 U/L (ref 15–41)
Alkaline Phosphatase: 58 U/L (ref 38–126)
Anion gap: 8 (ref 5–15)
BILIRUBIN TOTAL: 0.6 mg/dL (ref 0.3–1.2)
BUN: 12 mg/dL (ref 6–20)
CHLORIDE: 104 mmol/L (ref 101–111)
CO2: 22 mmol/L (ref 22–32)
CREATININE: 1.32 mg/dL — AB (ref 0.61–1.24)
Calcium: 8.7 mg/dL — ABNORMAL LOW (ref 8.9–10.3)
GFR calc Af Amer: >60 mL/min (ref >60)
GFR, EST NON AFRICAN AMERICAN: 53 mL/min — AB (ref >60)
GLUCOSE: 109 mg/dL — AB (ref 65–99)
Potassium: 3.1 mmol/L — ABNORMAL LOW (ref 3.5–5.1)
Sodium: 134 mmol/L — ABNORMAL LOW (ref 135–145)
TOTAL PROTEIN: 7.1 g/dL (ref 6.5–8.1)

## 2017-10-22 LAB — URINALYSIS, ROUTINE W REFLEX MICROSCOPIC
BACTERIA UA: NONE SEEN
Bilirubin Urine: NEGATIVE
Glucose, UA: NEGATIVE mg/dL
HGB URINE DIPSTICK: NEGATIVE
Ketones, ur: NEGATIVE mg/dL
Nitrite: NEGATIVE
PROTEIN: 30 mg/dL — AB
SPECIFIC GRAVITY, URINE: 1.044 — AB (ref 1.005–1.030)
pH: 5 (ref 5.0–8.0)

## 2017-10-22 LAB — CBC
HEMATOCRIT: 34.6 % — AB (ref 39.0–52.0)
Hemoglobin: 11.1 g/dL — ABNORMAL LOW (ref 13.0–17.0)
MCH: 27.3 pg (ref 26.0–34.0)
MCHC: 32.1 g/dL (ref 30.0–36.0)
MCV: 85.2 fL (ref 78.0–100.0)
PLATELETS: 283 10*3/uL (ref 150–400)
RBC: 4.06 MIL/uL — ABNORMAL LOW (ref 4.22–5.81)
RDW: 16.4 % — AB (ref 11.5–15.5)
WBC: 6.9 10*3/uL (ref 4.0–10.5)

## 2017-10-22 LAB — LIPASE, BLOOD: Lipase: 32 U/L (ref 11–51)

## 2017-10-22 LAB — I-STAT CG4 LACTIC ACID, ED: LACTIC ACID, VENOUS: 1.15 mmol/L (ref 0.5–1.9)

## 2017-10-22 NOTE — Discharge Instructions (Signed)
Continue the Augmentin  Call central [place] surgery Monday morning to get an appointment to see Dr. Marcello Moores, Dr. Dema Severin, or D. Gross in the next 1 to 2 weeks [phone]

## 2017-10-22 NOTE — ED Provider Notes (Signed)
[facility] EMERGENCY DEPARTMENT Provider Note   CSN: [id number] Arrival date & time: 10/22/17  0805     History   Chief Complaint Chief Complaint  Patient presents with  . Abdominal Pain    HPI Dean [name] is a 71 y.o. male.  HPI  Patient presents with concern of progression of his diverticulitis, abdominal discomfort. Though he acknowledges minimal current symptoms, and denies fever, vomiting, nausea. He has been receiving antibiotic therapy, orally for diverticulitis, diagnosed 2 weeks ago. Yesterday, during a follow-up visit the patient had a evaluation including CT scan. CT scan results were notable for demonstration of persistent perforation with walled off abscess, possible inflammatory changes adjacent to the bladder. He was sent here for evaluation.   Past Medical History:  Diagnosis Date  . Abdominal pain   . Acid reflux   . Alcohol screening   . Arthropathy   . Cardiac risk counseling   . Chest pain   . Colon polyp   . Diarrhea   . Diverticulitis   . Dizziness   . Fatigue   . Gout   . Hematuria   . Hepatitis A age 31  . Hyperlipemia   . Hypogonadism male   . Knee pain   . Need for shingles vaccine   . Night sweats   . Non compliance with medical treatment   . Palpitations   . Pollen allergies   . Rash   . Screening for HIV (human immunodeficiency virus)   . SOB (shortness of breath)   . Special screening for malignant neoplasm of colon   . Urinary frequency   . Vitamin D deficiency     Patient Active Problem List   Diagnosis Date Noted  . Diverticulitis of colon with perforation 10/07/2017  . Diarrhea 10/03/2017  . Iron deficiency anemia 10/03/2017  . Loss of weight 10/03/2017  . Left sided abdominal pain of unknown cause 10/03/2017  . Abnormal EKG 07/25/2017  . Chest pain 07/25/2017    Past Surgical History:  Procedure Laterality Date  . NONE         Home Medications    Prior to Admission medications     Medication Sig Start Date End Date Taking? Authorizing Provider  acetaminophen (TYLENOL) 325 MG tablet Take 325 mg every 6 (six) hours as needed by mouth for mild pain.   Yes [provider]  amoxicillin-clavulanate (AUGMENTIN) 875-125 MG tablet Take 1 tablet 2 (two) times daily for 14 days by mouth. 10/21/17 11/04/17 Yes Esterwood, Amy S, PA-C  Cholecalciferol (VITAMIN D3) 5000 units CAPS Take 5,000 Units daily by mouth.    Yes [provider]  febuxostat (ULORIC) 40 MG tablet Take 40 mg by mouth daily.   Yes [provider]  hydrocortisone cream 1 % Apply 1 application as needed topically for itching.   Yes [provider]  indomethacin (INDOCIN) 50 MG capsule Take 50 mg by mouth as directed. Take 50 mg by mouth as needed at the first sign of gout 03/02/17  Yes [provider]    Family History Family History  Problem Relation Age of Onset  . Hyperlipidemia Mother   . Alzheimer's disease Mother   . Hyperlipidemia Brother   . Alzheimer's disease Maternal Grandmother   . Colon cancer Neg Hx   . Esophageal cancer Neg Hx   . Stomach cancer Neg Hx     Social History Social History   Tobacco Use  . Smoking status: Former Research scientist (life sciences)  . Smokeless  tobacco: Never Used  Substance Use Topics  . Alcohol use: Yes    Alcohol/week: 0.0 oz    Comment: once monthly  . Drug use: No     Allergies   Aspirin   Review of Systems Review of Systems  Constitutional:       Per HPI, otherwise negative  HENT:       Per HPI, otherwise negative  Respiratory:       Per HPI, otherwise negative  Cardiovascular:       Per HPI, otherwise negative  Gastrointestinal: Negative for vomiting.  Endocrine:       Negative aside from HPI  Genitourinary:       Neg aside from HPI   Musculoskeletal:       Per HPI, otherwise negative  Skin: Negative.   Neurological: Negative for syncope.     Physical Exam Updated Vital Signs BP (!) 136/94   Pulse (!) 106    Resp 16   Ht 5' 9.5" (1.765 m)   Wt 72.6 kg (160 lb)   SpO2 100%   BMI 23.29 kg/m   Physical Exam  Constitutional: He is oriented to person, place, and time. He appears well-developed. No distress.  HENT:  Head: Normocephalic and atraumatic.  Eyes: Conjunctivae and EOM are normal.  Cardiovascular: Normal rate and regular rhythm.  Pulmonary/Chest: Effort normal. No stridor. No respiratory distress.  Abdominal: He exhibits no distension. There is no tenderness. There is no rigidity and no guarding.  Musculoskeletal: He exhibits no edema.  Neurological: He is alert and oriented to person, place, and time.  Skin: Skin is warm and dry.  Psychiatric: He has a normal mood and affect.  Nursing note and vitals reviewed.    ED Treatments / Results  Labs (all labs ordered are listed, but only abnormal results are displayed) Labs Reviewed  COMPREHENSIVE METABOLIC PANEL - Abnormal; Notable for the following components:      Result Value   Sodium 134 (*)    Potassium 3.1 (*)    Glucose, Bld 109 (*)    Creatinine, Ser 1.32 (*)    Calcium 8.7 (*)    Albumin 3.1 (*)    ALT 7 (*)    GFR calc non Af Amer 53 (*)    All other components within normal limits  CBC - Abnormal; Notable for the following components:   RBC 4.06 (*)    Hemoglobin 11.1 (*)    HCT 34.6 (*)    RDW 16.4 (*)    All other components within normal limits  URINALYSIS, ROUTINE W REFLEX MICROSCOPIC - Abnormal; Notable for the following components:   Color, Urine AMBER (*)    APPearance HAZY (*)    Specific Gravity, Urine 1.044 (*)    Protein, ur 30 (*)    Leukocytes, UA TRACE (*)    Squamous Epithelial / LPF 0-5 (*)    All other components within normal limits  LIPASE, BLOOD  I-STAT CG4 LACTIC ACID, ED    EKG  EKG Interpretation  Date/Time:  Saturday October 22 2017 08:20:30 EST Ventricular Rate:  121 PR Interval:    QRS Duration: 94 QT Interval:  354 QTC Calculation: 502 R Axis:   52 Text  Interpretation:  afib w rapid ventricular response ST-t wave abnormality Abnormal ekg Confirmed by Carmin Muskrat [id number]) on 10/22/2017 10:00:36 AM       Radiology Ct Abdomen Pelvis W Contrast  Result Date: 10/21/2017 CLINICAL DATA:  Follow-up known perforation with abscess. Evaluate  for resolution. EXAM: CT ABDOMEN AND PELVIS WITH CONTRAST TECHNIQUE: Multidetector CT imaging of the abdomen and pelvis was performed using the standard protocol following bolus administration of intravenous contrast. CONTRAST:  160mL ISOVUE-300 IOPAMIDOL (ISOVUE-300) INJECTION 61% COMPARISON:  CT abdomen dated 10/07/2017. FINDINGS: Lower chest: No acute abnormality. Hepatobiliary: Tiny hypodense lesion within the right liver lobe is too small to characterize, stable compared to the previous study and most likely a benign cyst. Incidental note made of focal fatty infiltration in the left liver lobe. No suspicious mass or lesion within the liver. Gallbladder appears normal. No bile duct dilatation. Pancreas: Unremarkable. No pancreatic ductal dilatation or surrounding inflammatory changes. Spleen: Normal in size without focal abnormality. Adrenals/Urinary Tract: Adrenal glands appear normal. Kidneys are unremarkable without mass, stone or hydronephrosis. Bladder is decompressed. Stomach/Bowel: Persistent thickening of the walls of the sigmoid colon, compatible with sequela of recent diverticulitis. Adjacent extraluminal collection of air and fluid (mostly air), with surrounding thick wall consistent with residual abscess, is again seen along the anterior inferior margin of this affected portion of the sigmoid colon. This collection measures 2.9 cm greatest dimension. The wall of the collection and/or surrounding edema extends inferiorly to the dome of the bladder, possibly contiguous (coronal series 6, images 17 through 23 ; axial series 2, images 71 through 77). Additional extraluminal collection just above the affected  portion of the sigmoid colon, likely multiloculated, is stable, altogether measuring 3.2 cm greatest dimension (coronal series 6, image 29 ; axial series 2, image 64). No dilated large or small bowel loops. Moderate amount of stool throughout the nondistended colon. Fairly extensive diverticulosis again noted within the sigmoid and descending colon. Vascular/Lymphatic: Aortic atherosclerosis. No acute appearing vascular abnormality. No enlarged lymph nodes seen. Reproductive: Prostate is unremarkable. Other: No new fluid collection or abscess. No free intraperitoneal air. Musculoskeletal: No acute findings. Mild degenerative spurring throughout the thoracolumbar spine. Small bilateral inguinal hernias which contain fat only. IMPRESSION: 1. No significant interval change. Two extraluminal collections of air and fluid (mostly air), with thick circumferential walls, corresponding to the previously described sites of contained perforation, appear stable in size and configuration compared to the earlier CT of 10/07/2017. 2. Of note, the wall of the more anterior-inferior collection extends inferiorly to the dome of the bladder, possibly contiguous. There is associated thickening of the walls of the superior-anterior bladder. No air within the bladder to suggest fistula. 3. Thickening of the walls of the sigmoid colon, stable, compatible with recent diverticulitis. No new extraluminal collection. 4. Aortic atherosclerosis. Electronically Signed   By: Franki Cabot M.D.   On: 10/21/2017 15:51    Procedures Procedures (including critical care time)  Medications Ordered in ED Medications - No data to display   Initial Impression / Assessment and Plan / ED Course  I have reviewed the triage vital signs and the nursing notes.  Pertinent labs & imaging results that were available during my care of the patient were reviewed by me and considered in my medical decision making (see chart for details).  Clinical  Course as of Oct 22 1141  Sat Oct 22, 2017  1105 Spoke with Dr. Ninfa Linden, general surgeon. States patient can be discharged with office follow up. Does not need any additional testing, medications, or prescriptions.  [SJ]    Clinical Course User Index [SJ] Joy, Shawn C, PA-C    11:45 AM Patient is awake and alert, in no distress. He has been seen by our surgical colleagues, and we had a  lengthy conversation about all findings again. With minimal complaints, the patient will continue new outpatient antibiotic oral therapy, follow-up in the surgery clinic for consideration of IR versus surgical intervention for his walled off abscesses. Patient acknowledges importance of following up, take medication as directed, and was discharged in stable condition.  Final Clinical Impressions(s) / ED Diagnoses  Diverticulitis with perforation   Carmin Muskrat, MD 10/22/17 1145

## 2017-10-22 NOTE — Consult Note (Signed)
Reason for Consult:diverticulitis with perforation Referring Physician: Dr. Hattie Perch is an 71 y.o. male.  HPI: This patient is known to [facility] after a brief admission for diverticulitis with small microperforation and abscess.  He was discharged home after 1 day in hospital on Cipro and Flagyl.  He saw the gastroenterologist yesterday.  They ordered a repeat CT scan.  He was told last night to come to the emergency room either last night or this morning because he need to be admitted for IV antibiotics for several days.  They did write him for Augmentin of which he took one last night. He reports no abdominal pain.  He denies fevers.  He is having normal bowel movements.  Past Medical History:  Diagnosis Date  . Abdominal pain   . Acid reflux   . Alcohol screening   . Arthropathy   . Cardiac risk counseling   . Chest pain   . Colon polyp   . Diarrhea   . Diverticulitis   . Dizziness   . Fatigue   . Gout   . Hematuria   . Hepatitis A age 64  . Hyperlipemia   . Hypogonadism male   . Knee pain   . Need for shingles vaccine   . Night sweats   . Non compliance with medical treatment   . Palpitations   . Pollen allergies   . Rash   . Screening for HIV (human immunodeficiency virus)   . SOB (shortness of breath)   . Special screening for malignant neoplasm of colon   . Urinary frequency   . Vitamin D deficiency     Past Surgical History:  Procedure Laterality Date  . NONE      Family History  Problem Relation Age of Onset  . Hyperlipidemia Mother   . Alzheimer's disease Mother   . Hyperlipidemia Brother   . Alzheimer's disease Maternal Grandmother   . Colon cancer Neg Hx   . Esophageal cancer Neg Hx   . Stomach cancer Neg Hx     Social History:  reports that he has quit smoking. he has never used smokeless tobacco. He reports that he drinks alcohol. He reports that he does not use drugs.  Allergies:  Allergies  Allergen Reactions   . Aspirin Other (See Comments)    Will flare up gout Will flare up gout    Medications: I have reviewed the patient's current medications.  Results for orders placed or performed during the hospital encounter of 10/22/17 (from the past 48 hour(s))  Urinalysis, Routine w reflex microscopic     Status: Abnormal   Collection Time: 10/22/17  8:35 AM  Result Value Ref Range   Color, Urine AMBER (A) YELLOW    Comment: BIOCHEMICALS MAY BE AFFECTED BY COLOR   APPearance HAZY (A) CLEAR   Specific Gravity, Urine 1.044 (H) 1.005 - 1.030   pH 5.0 5.0 - 8.0   Glucose, UA NEGATIVE NEGATIVE mg/dL   Hgb urine dipstick NEGATIVE NEGATIVE   Bilirubin Urine NEGATIVE NEGATIVE   Ketones, ur NEGATIVE NEGATIVE mg/dL   Protein, ur 30 (A) NEGATIVE mg/dL   Nitrite NEGATIVE NEGATIVE   Leukocytes, UA TRACE (A) NEGATIVE   RBC / HPF 0-5 0 - 5 RBC/hpf   WBC, UA 6-30 0 - 5 WBC/hpf   Bacteria, UA NONE SEEN NONE SEEN   Squamous Epithelial / LPF 0-5 (A) NONE SEEN   Mucus PRESENT    Hyaline Casts, UA PRESENT   Lipase,  blood     Status: None   Collection Time: 10/22/17  8:38 AM  Result Value Ref Range   Lipase 32 11 - 51 U/L  Comprehensive metabolic panel     Status: Abnormal   Collection Time: 10/22/17  8:38 AM  Result Value Ref Range   Sodium 134 (L) 135 - 145 mmol/L   Potassium 3.1 (L) 3.5 - 5.1 mmol/L   Chloride 104 101 - 111 mmol/L   CO2 22 22 - 32 mmol/L   Glucose, Bld 109 (H) 65 - 99 mg/dL   BUN 12 6 - 20 mg/dL   Creatinine, Ser 1.32 (H) 0.61 - 1.24 mg/dL   Calcium 8.7 (L) 8.9 - 10.3 mg/dL   Total Protein 7.1 6.5 - 8.1 g/dL   Albumin 3.1 (L) 3.5 - 5.0 g/dL   AST 19 15 - 41 U/L   ALT 7 (L) 17 - 63 U/L   Alkaline Phosphatase 58 38 - 126 U/L   Total Bilirubin 0.6 0.3 - 1.2 mg/dL   GFR calc non Af Amer 53 (L) >60 mL/min   GFR calc Af Amer >60 >60 mL/min    Comment: (NOTE) The eGFR has been calculated using the CKD EPI equation. This calculation has not been validated in all clinical  situations. eGFR's persistently <60 mL/min signify possible Chronic Kidney Disease.    Anion gap 8 5 - 15  CBC     Status: Abnormal   Collection Time: 10/22/17  8:38 AM  Result Value Ref Range   WBC 6.9 4.0 - 10.5 K/uL   RBC 4.06 (L) 4.22 - 5.81 MIL/uL   Hemoglobin 11.1 (L) 13.0 - 17.0 g/dL   HCT 34.6 (L) 39.0 - 52.0 %   MCV 85.2 78.0 - 100.0 fL   MCH 27.3 26.0 - 34.0 pg   MCHC 32.1 30.0 - 36.0 g/dL   RDW 16.4 (H) 11.5 - 15.5 %   Platelets 283 150 - 400 K/uL  I-Stat CG4 Lactic Acid, ED     Status: None   Collection Time: 10/22/17  8:48 AM  Result Value Ref Range   Lactic Acid, Venous 1.15 0.5 - 1.9 mmol/L    Ct Abdomen Pelvis W Contrast  Result Date: 10/21/2017 CLINICAL DATA:  Follow-up known perforation with abscess. Evaluate for resolution. EXAM: CT ABDOMEN AND PELVIS WITH CONTRAST TECHNIQUE: Multidetector CT imaging of the abdomen and pelvis was performed using the standard protocol following bolus administration of intravenous contrast. CONTRAST:  16m ISOVUE-300 IOPAMIDOL (ISOVUE-300) INJECTION 61% COMPARISON:  CT abdomen dated 10/07/2017. FINDINGS: Lower chest: No acute abnormality. Hepatobiliary: Tiny hypodense lesion within the right liver lobe is too small to characterize, stable compared to the previous study and most likely a benign cyst. Incidental note made of focal fatty infiltration in the left liver lobe. No suspicious mass or lesion within the liver. Gallbladder appears normal. No bile duct dilatation. Pancreas: Unremarkable. No pancreatic ductal dilatation or surrounding inflammatory changes. Spleen: Normal in size without focal abnormality. Adrenals/Urinary Tract: Adrenal glands appear normal. Kidneys are unremarkable without mass, stone or hydronephrosis. Bladder is decompressed. Stomach/Bowel: Persistent thickening of the walls of the sigmoid colon, compatible with sequela of recent diverticulitis. Adjacent extraluminal collection of air and fluid (mostly air), with  surrounding thick wall consistent with residual abscess, is again seen along the anterior inferior margin of this affected portion of the sigmoid colon. This collection measures 2.9 cm greatest dimension. The wall of the collection and/or surrounding edema extends inferiorly to the dome of  the bladder, possibly contiguous (coronal series 6, images 17 through 23 ; axial series 2, images 71 through 77). Additional extraluminal collection just above the affected portion of the sigmoid colon, likely multiloculated, is stable, altogether measuring 3.2 cm greatest dimension (coronal series 6, image 29 ; axial series 2, image 64). No dilated large or small bowel loops. Moderate amount of stool throughout the nondistended colon. Fairly extensive diverticulosis again noted within the sigmoid and descending colon. Vascular/Lymphatic: Aortic atherosclerosis. No acute appearing vascular abnormality. No enlarged lymph nodes seen. Reproductive: Prostate is unremarkable. Other: No new fluid collection or abscess. No free intraperitoneal air. Musculoskeletal: No acute findings. Mild degenerative spurring throughout the thoracolumbar spine. Small bilateral inguinal hernias which contain fat only. IMPRESSION: 1. No significant interval change. Two extraluminal collections of air and fluid (mostly air), with thick circumferential walls, corresponding to the previously described sites of contained perforation, appear stable in size and configuration compared to the earlier CT of 10/07/2017. 2. Of note, the wall of the more anterior-inferior collection extends inferiorly to the dome of the bladder, possibly contiguous. There is associated thickening of the walls of the superior-anterior bladder. No air within the bladder to suggest fistula. 3. Thickening of the walls of the sigmoid colon, stable, compatible with recent diverticulitis. No new extraluminal collection. 4. Aortic atherosclerosis. Electronically Signed   By: Franki Cabot  M.D.   On: 10/21/2017 15:51    Review of Systems  All other systems reviewed and are negative.  Blood pressure (!) 136/94, pulse (!) 106, resp. rate 16, height 5' 9.5" (1.765 m), weight 72.6 kg (160 lb), SpO2 100 %. Physical Exam  Constitutional: He is oriented to person, place, and time. He appears well-developed and well-nourished. No distress.  HENT:  Right Ear: External ear normal.  Left Ear: External ear normal.  Nose: Nose normal.  Eyes: Pupils are equal, round, and reactive to light.  Neck: Normal range of motion. No tracheal deviation present.  Cardiovascular: Normal rate, regular rhythm and normal heart sounds.  Respiratory: Effort normal and breath sounds normal. No respiratory distress.  GI: Soft. He exhibits no mass. There is no tenderness. There is no guarding.  Musculoskeletal: Normal range of motion. He exhibits no edema.  Neurological: He is alert and oriented to person, place, and time.  Skin: Skin is warm. No rash noted. No erythema.  Psychiatric: His behavior is normal. Judgment normal.    Assessment/Plan: Diverticulitis with microperforation and 2 small collections of air and fluid.  These are minimally changed.  I had a long discussion with the patient.  He is completely asymptomatic.  I do not believe he needs admission for IV antibiotics.  I believe he just needs a course of Augmentin and then a follow-up CT scan unless he develops fevers or chills.  He is in complete agreement with the plan.  I will have him call our office Monday morning and get an appointment to see either Dr. Marcello Moores, Dr. Dema Severin, or Dr. Johney Maine for a follow-up.  We could then possibly arrange an outpatient procedure with interventional radiology.  Oscar Forman A 10/22/2017, 11:14 AM

## 2017-10-22 NOTE — ED Triage Notes (Signed)
Pt had a follow up CT yesterday for a known  Contained perforation that was being treated with antibiotics and on follow up CT shown no improvement. Pt states he doesn't really have pain but just feels like gas and bloating in abd. No recent change in stool.

## 2017-10-24 ENCOUNTER — Inpatient Hospital Stay (HOSPITAL_COMMUNITY)
Admission: EM | Admit: 2017-10-24 | Discharge: 2017-11-06 | DRG: 234 | Disposition: A | Discharge status: Home / Self Care | Payer: Medicare Other | Attending: Surgery | Admitting: Surgery

## 2017-10-24 ENCOUNTER — Emergency Department (HOSPITAL_COMMUNITY): Payer: Medicare Other

## 2017-10-24 ENCOUNTER — Encounter (HOSPITAL_COMMUNITY): Payer: Self-pay

## 2017-10-24 ENCOUNTER — Ambulatory Visit (HOSPITAL_COMMUNITY): Admission: RE | Admit: 2017-10-24 | Payer: Medicare Other | Source: Ambulatory Visit

## 2017-10-24 ENCOUNTER — Other Ambulatory Visit: Payer: Self-pay

## 2017-10-24 DIAGNOSIS — E785 Hyperlipidemia, unspecified: Secondary | ICD-10-CM | POA: Diagnosis not present

## 2017-10-24 DIAGNOSIS — I340 Nonrheumatic mitral (valve) insufficiency: Secondary | ICD-10-CM | POA: Diagnosis not present

## 2017-10-24 DIAGNOSIS — Y838 Other surgical procedures as the cause of abnormal reaction of the patient, or of later complication, without mention of misadventure at the time of the procedure: Secondary | ICD-10-CM | POA: Diagnosis not present

## 2017-10-24 DIAGNOSIS — I97190 Other postprocedural cardiac functional disturbances following cardiac surgery: Secondary | ICD-10-CM | POA: Diagnosis not present

## 2017-10-24 DIAGNOSIS — I440 Atrioventricular block, first degree: Secondary | ICD-10-CM | POA: Diagnosis not present

## 2017-10-24 DIAGNOSIS — Z09 Encounter for follow-up examination after completed treatment for conditions other than malignant neoplasm: Secondary | ICD-10-CM

## 2017-10-24 DIAGNOSIS — K219 Gastro-esophageal reflux disease without esophagitis: Secondary | ICD-10-CM | POA: Diagnosis present

## 2017-10-24 DIAGNOSIS — Z8349 Family history of other endocrine, nutritional and metabolic diseases: Secondary | ICD-10-CM | POA: Diagnosis not present

## 2017-10-24 DIAGNOSIS — Z79899 Other long term (current) drug therapy: Secondary | ICD-10-CM | POA: Diagnosis not present

## 2017-10-24 DIAGNOSIS — D62 Acute posthemorrhagic anemia: Secondary | ICD-10-CM | POA: Diagnosis not present

## 2017-10-24 DIAGNOSIS — I959 Hypotension, unspecified: Secondary | ICD-10-CM | POA: Diagnosis present

## 2017-10-24 DIAGNOSIS — D649 Anemia, unspecified: Secondary | ICD-10-CM | POA: Diagnosis not present

## 2017-10-24 DIAGNOSIS — I25110 Atherosclerotic heart disease of native coronary artery with unstable angina pectoris: Secondary | ICD-10-CM | POA: Diagnosis present

## 2017-10-24 DIAGNOSIS — I447 Left bundle-branch block, unspecified: Secondary | ICD-10-CM | POA: Diagnosis present

## 2017-10-24 DIAGNOSIS — Z87891 Personal history of nicotine dependence: Secondary | ICD-10-CM | POA: Diagnosis not present

## 2017-10-24 DIAGNOSIS — Z951 Presence of aortocoronary bypass graft: Secondary | ICD-10-CM | POA: Diagnosis not present

## 2017-10-24 DIAGNOSIS — Z86010 Personal history of colonic polyps: Secondary | ICD-10-CM

## 2017-10-24 DIAGNOSIS — N183 Chronic kidney disease, stage 3 (moderate): Secondary | ICD-10-CM | POA: Diagnosis present

## 2017-10-24 DIAGNOSIS — I509 Heart failure, unspecified: Secondary | ICD-10-CM | POA: Diagnosis present

## 2017-10-24 DIAGNOSIS — Z792 Long term (current) use of antibiotics: Secondary | ICD-10-CM

## 2017-10-24 DIAGNOSIS — I214 Non-ST elevation (NSTEMI) myocardial infarction: Secondary | ICD-10-CM | POA: Diagnosis not present

## 2017-10-24 DIAGNOSIS — Z820 Family history of epilepsy and other diseases of the nervous system: Secondary | ICD-10-CM | POA: Diagnosis not present

## 2017-10-24 DIAGNOSIS — Z01810 Encounter for preprocedural cardiovascular examination: Secondary | ICD-10-CM | POA: Diagnosis not present

## 2017-10-24 DIAGNOSIS — M1A9XX1 Chronic gout, unspecified, with tophus (tophi): Secondary | ICD-10-CM | POA: Diagnosis present

## 2017-10-24 DIAGNOSIS — K5720 Diverticulitis of large intestine with perforation and abscess without bleeding: Secondary | ICD-10-CM | POA: Diagnosis not present

## 2017-10-24 DIAGNOSIS — I351 Nonrheumatic aortic (valve) insufficiency: Secondary | ICD-10-CM | POA: Diagnosis not present

## 2017-10-24 DIAGNOSIS — I480 Paroxysmal atrial fibrillation: Secondary | ICD-10-CM | POA: Diagnosis present

## 2017-10-24 DIAGNOSIS — Y92230 Patient room in hospital as the place of occurrence of the external cause: Secondary | ICD-10-CM | POA: Diagnosis not present

## 2017-10-24 DIAGNOSIS — I4892 Unspecified atrial flutter: Secondary | ICD-10-CM | POA: Diagnosis not present

## 2017-10-24 DIAGNOSIS — E559 Vitamin D deficiency, unspecified: Secondary | ICD-10-CM | POA: Diagnosis present

## 2017-10-24 DIAGNOSIS — R001 Bradycardia, unspecified: Secondary | ICD-10-CM | POA: Diagnosis present

## 2017-10-24 DIAGNOSIS — M129 Arthropathy, unspecified: Secondary | ICD-10-CM | POA: Diagnosis present

## 2017-10-24 DIAGNOSIS — I129 Hypertensive chronic kidney disease with stage 1 through stage 4 chronic kidney disease, or unspecified chronic kidney disease: Secondary | ICD-10-CM | POA: Diagnosis present

## 2017-10-24 DIAGNOSIS — R197 Diarrhea, unspecified: Secondary | ICD-10-CM | POA: Diagnosis not present

## 2017-10-24 DIAGNOSIS — Z886 Allergy status to analgesic agent status: Secondary | ICD-10-CM | POA: Diagnosis not present

## 2017-10-24 DIAGNOSIS — J9811 Atelectasis: Secondary | ICD-10-CM

## 2017-10-24 DIAGNOSIS — E876 Hypokalemia: Secondary | ICD-10-CM | POA: Diagnosis not present

## 2017-10-24 DIAGNOSIS — R634 Abnormal weight loss: Secondary | ICD-10-CM | POA: Diagnosis not present

## 2017-10-24 DIAGNOSIS — D509 Iron deficiency anemia, unspecified: Secondary | ICD-10-CM | POA: Diagnosis present

## 2017-10-24 DIAGNOSIS — M7989 Other specified soft tissue disorders: Secondary | ICD-10-CM | POA: Diagnosis present

## 2017-10-24 DIAGNOSIS — E8809 Other disorders of plasma-protein metabolism, not elsewhere classified: Secondary | ICD-10-CM | POA: Diagnosis not present

## 2017-10-24 HISTORY — DX: Non-ST elevation (NSTEMI) myocardial infarction: I21.4

## 2017-10-24 HISTORY — DX: Unspecified atrial fibrillation: I48.91

## 2017-10-24 LAB — BASIC METABOLIC PANEL
ANION GAP: 8 (ref 5–15)
BUN: 11 mg/dL (ref 6–20)
CHLORIDE: 106 mmol/L (ref 101–111)
CO2: 23 mmol/L (ref 22–32)
Calcium: 9 mg/dL (ref 8.9–10.3)
Creatinine, Ser: 1.15 mg/dL (ref 0.61–1.24)
GFR calc Af Amer: >60 mL/min (ref >60)
GLUCOSE: 135 mg/dL — AB (ref 65–99)
POTASSIUM: 3 mmol/L — AB (ref 3.5–5.1)
SODIUM: 137 mmol/L (ref 135–145)

## 2017-10-24 LAB — I-STAT CG4 LACTIC ACID, ED
LACTIC ACID, VENOUS: 0.94 mmol/L (ref 0.5–1.9)
Lactic Acid, Venous: 2.07 mmol/L (ref 0.5–1.9)

## 2017-10-24 LAB — I-STAT TROPONIN, ED
Troponin i, poc: 0.29 ng/mL (ref 0.00–0.08)
Troponin i, poc: 5.09 ng/mL (ref 0.00–0.08)

## 2017-10-24 LAB — CBC
HEMATOCRIT: 36.7 % — AB (ref 39.0–52.0)
HEMOGLOBIN: 11.6 g/dL — AB (ref 13.0–17.0)
MCH: 27.3 pg (ref 26.0–34.0)
MCHC: 31.6 g/dL (ref 30.0–36.0)
MCV: 86.4 fL (ref 78.0–100.0)
Platelets: 317 10*3/uL (ref 150–400)
RBC: 4.25 MIL/uL (ref 4.22–5.81)
RDW: 16.7 % — ABNORMAL HIGH (ref 11.5–15.5)
WBC: 6.8 10*3/uL (ref 4.0–10.5)

## 2017-10-24 LAB — TROPONIN I
TROPONIN I: 5.46 ng/mL — AB (ref <0.03)
Troponin I: 12.27 ng/mL (ref <0.03)

## 2017-10-24 LAB — HEPARIN LEVEL (UNFRACTIONATED): HEPARIN UNFRACTIONATED: 0.24 [IU]/mL — AB (ref 0.30–0.70)

## 2017-10-24 MED ORDER — SODIUM CHLORIDE 0.9% FLUSH
3.0000 mL | Freq: Two times a day (BID) | INTRAVENOUS | Status: DC
  Administered 2017-10-24 – 2017-10-25 (×2): 3 mL via INTRAVENOUS

## 2017-10-24 MED ORDER — SODIUM CHLORIDE 0.9% FLUSH: 3.0000 mL | INTRAVENOUS | Status: DC | PRN

## 2017-10-24 MED ORDER — CLOPIDOGREL BISULFATE 75 MG PO TABS
75.0000 mg | ORAL_TABLET | Freq: Every day | ORAL | Status: DC
  Administered 2017-10-24 – 2017-10-25 (×2): 75 mg via ORAL
  Filled 2017-10-24 (×2): qty 1

## 2017-10-24 MED ORDER — HEPARIN (PORCINE) IN NACL 100-0.45 UNIT/ML-% IJ SOLN
1200.0000 [IU]/h | INTRAMUSCULAR | Status: DC
  Administered 2017-10-25: 1200 [IU]/h via INTRAVENOUS
  Filled 2017-10-24: qty 250

## 2017-10-24 MED ORDER — POTASSIUM CHLORIDE CRYS ER 20 MEQ PO TBCR
40.0000 meq | EXTENDED_RELEASE_TABLET | Freq: Once | ORAL | Status: AC
  Administered 2017-10-24: 40 meq via ORAL
  Filled 2017-10-24: qty 2

## 2017-10-24 MED ORDER — METOPROLOL TARTRATE 25 MG PO TABS
25.0000 mg | ORAL_TABLET | Freq: Two times a day (BID) | ORAL | Status: DC
  Administered 2017-10-24 – 2017-10-25 (×2): 25 mg via ORAL
  Filled 2017-10-24 (×3): qty 1

## 2017-10-24 MED ORDER — HEPARIN (PORCINE) IN NACL 100-0.45 UNIT/ML-% IJ SOLN
950.0000 [IU]/h | INTRAMUSCULAR | Status: DC
  Administered 2017-10-24: 950 [IU]/h via INTRAVENOUS
  Filled 2017-10-24: qty 250

## 2017-10-24 MED ORDER — ROSUVASTATIN CALCIUM 10 MG PO TABS
20.0000 mg | ORAL_TABLET | Freq: Every day | ORAL | Status: DC
  Administered 2017-10-24: 20 mg via ORAL
  Filled 2017-10-24: qty 1

## 2017-10-24 MED ORDER — PIPERACILLIN-TAZOBACTAM 3.375 G IVPB 30 MIN
3.3750 g | Freq: Once | INTRAVENOUS | Status: AC
  Administered 2017-10-24: 3.375 g via INTRAVENOUS
  Filled 2017-10-24: qty 50

## 2017-10-24 MED ORDER — ASPIRIN 325 MG PO TABS: 325.0000 mg | ORAL_TABLET | Freq: Every day | ORAL | Status: DC

## 2017-10-24 MED ORDER — SODIUM CHLORIDE 0.9 % IV BOLUS (SEPSIS)
1000.0000 mL | Freq: Once | INTRAVENOUS | Status: AC
  Administered 2017-10-24: 1000 mL via INTRAVENOUS

## 2017-10-24 MED ORDER — ASPIRIN 81 MG PO CHEW
324.0000 mg | CHEWABLE_TABLET | Freq: Once | ORAL | Status: AC
  Administered 2017-10-24: 324 mg via ORAL
  Filled 2017-10-24: qty 4

## 2017-10-24 MED ORDER — ASPIRIN EC 81 MG PO TBEC
81.0000 mg | DELAYED_RELEASE_TABLET | Freq: Every day | ORAL | Status: DC
  Administered 2017-10-25: 81 mg via ORAL
  Filled 2017-10-24: qty 1

## 2017-10-24 MED ORDER — SODIUM CHLORIDE 0.9 % IV SOLN: 250.0000 mL | INTRAVENOUS | Status: DC | PRN

## 2017-10-24 MED ORDER — HEPARIN BOLUS VIA INFUSION
4000.0000 [IU] | Freq: Once | INTRAVENOUS | Status: AC
  Administered 2017-10-24: 4000 [IU] via INTRAVENOUS
  Filled 2017-10-24: qty 4000

## 2017-10-24 MED ORDER — ASPIRIN 325 MG PO TABS
ORAL_TABLET | ORAL | Status: AC
  Filled 2017-10-24: qty 1

## 2017-10-24 MED ORDER — INFLUENZA VAC SPLIT HIGH-DOSE 0.5 ML IM SUSY
0.5000 mL | PREFILLED_SYRINGE | INTRAMUSCULAR | Status: DC | PRN
Start: 2017-10-24 — End: 2017-10-26

## 2017-10-24 MED ORDER — METOPROLOL TARTRATE 25 MG PO TABS
12.5000 mg | ORAL_TABLET | Freq: Two times a day (BID) | ORAL | Status: DC
  Administered 2017-10-24: 12.5 mg via ORAL
  Filled 2017-10-24: qty 1

## 2017-10-24 MED ORDER — FEBUXOSTAT 40 MG PO TABS
40.0000 mg | ORAL_TABLET | Freq: Every day | ORAL | Status: DC
  Administered 2017-10-24 – 2017-11-06 (×13): 40 mg via ORAL
  Filled 2017-10-24 (×16): qty 1

## 2017-10-24 MED ORDER — ASPIRIN 81 MG PO CHEW: 324.0000 mg | CHEWABLE_TABLET | Freq: Once | ORAL | Status: DC

## 2017-10-24 MED ORDER — SODIUM CHLORIDE 0.9 % IV SOLN
INTRAVENOUS | Status: DC
  Administered 2017-10-24: 23:00:00 via INTRAVENOUS

## 2017-10-24 NOTE — ED Provider Notes (Signed)
[facility]PART[facility] Provider Note   CSN: [id number] Arrival date & time: 10/24/17  1006     History   Chief Complaint Chief Complaint  Patient presents with  . Chest Pain    HPI Dean [name] is a 71 y.o. male with PMHx perforated divertiulitis, gout, hyperlipidemia, presenting to the ED with acute onset of chest pain that began while sitting in his car at 0800 today. Pt describes pain as an intense pressure, with assoc nausea and diaphoresis. He states it felt like it was hard to breathe. Reports he drove here this morning and sat in his car for 45 minutes until he could actually move to get out of the car. He states the pain lasted about 3 hours, it has since resolved. Reports history of intermittent chest pains that he was seeing a cardiologist, Dr. Acie Fredrickson, who did an ECHO. He states he was supposed to have a stress test but never got it. Denies hx of other cardiac events.  Per chart review, pt seen twice this month for perforated diverticulitis, which he is currently taking antibiotics for. Followed by [facility]. Hx of smoking x32yrs, quit 35 years ago.   The history is provided by the patient.    Past Medical History:  Diagnosis Date  . Abdominal pain   . Acid reflux   . Alcohol screening   . Arthropathy   . Cardiac risk counseling   . Chest pain   . Colon polyp   . Diarrhea   . Diverticulitis   . Dizziness   . Fatigue   . Gout   . Hematuria   . Hepatitis A age 50  . Hyperlipemia   . Hypogonadism male   . Knee pain   . Need for shingles vaccine   . Night sweats   . Non compliance with medical treatment   . Palpitations   . Pollen allergies   . Rash   . Screening for HIV (human immunodeficiency virus)   . SOB (shortness of breath)   . Special screening for malignant neoplasm of colon   . Urinary frequency   . Vitamin D deficiency     Patient Active Problem List   Diagnosis Date Noted  . Diverticulitis of colon with  perforation 10/07/2017  . Diarrhea 10/03/2017  . Iron deficiency anemia 10/03/2017  . Loss of weight 10/03/2017  . Left sided abdominal pain of unknown cause 10/03/2017  . Abnormal EKG 07/25/2017  . Chest pain 07/25/2017    Past Surgical History:  Procedure Laterality Date  . NONE         Home Medications    Prior to Admission medications   Medication Sig Start Date End Date Taking? Authorizing Provider  acetaminophen (TYLENOL) 325 MG tablet Take 325 mg every 6 (six) hours as needed by mouth for mild pain.   Yes [provider]  amoxicillin-clavulanate (AUGMENTIN) 875-125 MG tablet Take 1 tablet 2 (two) times daily for 14 days by mouth. 10/21/17 11/04/17 Yes Esterwood, Amy S, PA-C  Cholecalciferol (VITAMIN D3) 5000 units CAPS Take 5,000 Units daily by mouth.    Yes [provider]  febuxostat (ULORIC) 40 MG tablet Take 40 mg by mouth daily.   Yes [provider]  hydrocortisone cream 1 % Apply 1 application as needed topically for itching.   Yes [provider]  indomethacin (INDOCIN) 50 MG capsule Take 50 mg daily as needed by mouth for mild pain (gout flare up).  03/02/17  Yes  [provider]    Family History Family History  Problem Relation Age of Onset  . Hyperlipidemia Mother   . Alzheimer's disease Mother   . Hyperlipidemia Brother   . Alzheimer's disease Maternal Grandmother   . Colon cancer Neg Hx   . Esophageal cancer Neg Hx   . Stomach cancer Neg Hx     Social History Social History   Tobacco Use  . Smoking status: Former Research scientist (life sciences)  . Smokeless tobacco: Never Used  Substance Use Topics  . Alcohol use: Yes    Alcohol/week: 0.0 oz    Comment: once monthly  . Drug use: No     Allergies   Aspirin   Review of Systems Review of Systems  Constitutional: Positive for diaphoresis.  Respiratory: Positive for shortness of breath.   Cardiovascular: Positive for chest pain. Negative for leg swelling.    Gastrointestinal: Positive for nausea.  All other systems reviewed and are negative.    Physical Exam Updated Vital Signs BP 133/82   Pulse 85   Temp 98.4 F (36.9 C) (Oral)   Resp 18   SpO2 99%   Physical Exam  Constitutional: He is oriented to person, place, and time. He appears well-developed and well-nourished. He does not appear ill.  HENT:  Head: Normocephalic and atraumatic.  Eyes: Conjunctivae are normal.  Neck: Normal range of motion. Neck supple.  Cardiovascular: Normal rate, regular rhythm and normal pulses.  No murmur heard. Pulmonary/Chest: Effort normal and breath sounds normal. No respiratory distress.  Abdominal: Soft.  Musculoskeletal:       Right lower leg: He exhibits no tenderness and no edema.       Left lower leg: He exhibits no tenderness and no edema.  Neurological: He is alert and oriented to person, place, and time.  Skin: Skin is warm.  Psychiatric: He has a normal mood and affect. His behavior is normal.  Nursing note and vitals reviewed.    ED Treatments / Results  Labs (all labs ordered are listed, but only abnormal results are displayed) Labs Reviewed  BASIC METABOLIC PANEL - Abnormal; Notable for the following components:      Result Value   Potassium 3.0 (*)    Glucose, Bld 135 (*)    All other components within normal limits  CBC - Abnormal; Notable for the following components:   Hemoglobin 11.6 (*)    HCT 36.7 (*)    RDW 16.7 (*)    All other components within normal limits  I-STAT TROPONIN, ED - Abnormal; Notable for the following components:   Troponin i, poc 0.29 (*)    All other components within normal limits  I-STAT CG4 LACTIC ACID, ED - Abnormal; Notable for the following components:   Lactic Acid, Venous 2.07 (*)    All other components within normal limits  I-STAT TROPONIN, ED - Abnormal; Notable for the following components:   Troponin i, poc 5.09 (*)    All other components within normal limits  CULTURE, BLOOD  (ROUTINE X 2)  CULTURE, BLOOD (ROUTINE X 2)  HEPARIN LEVEL (UNFRACTIONATED)  I-STAT CG4 LACTIC ACID, ED    EKG  EKG Interpretation  Date/Time:  Monday October 24 2017 10:10:49 EST Ventricular Rate:  117 PR Interval:  186 QRS Duration: 106 QT Interval:  298 QTC Calculation: 415 R Axis:   -4 Text Interpretation:  Sinus tachycardia with Premature supraventricular complexes and Fusion complexes with junctional escape complexes Incomplete left bundle branch block Marked ST abnormality, possible anterior  subendocardial injury Abnormal ECG No significant change was found Confirmed by Jola Schmidt [phone]) on 10/24/2017 10:33:40 AM       Radiology Dg Chest 2 View  Result Date: 10/24/2017 CLINICAL DATA:  Left chest pain beginning this morning. EXAM: CHEST  2 VIEW COMPARISON:  None. FINDINGS: Lungs are clear. Heart size is normal. No pneumothorax or pleural effusion. No focal bony abnormality. Contrast in the colon from CT abdomen and pelvis 10/21/2017 noted. IMPRESSION: No acute disease. Electronically Signed   By: Inge Rise M.D.   On: 10/24/2017 10:49    Procedures Procedures (including critical care time)  Medications Ordered in ED Medications  heparin ADULT infusion 100 units/mL (25000 units/275mL sodium chloride 0.45%) (950 Units/hr Intravenous New Bag/Given 10/24/17 1432)  piperacillin-tazobactam (ZOSYN) IVPB 3.375 g (3.375 g Intravenous New Bag/Given 10/24/17 1441)  potassium chloride SA (K-DUR,KLOR-CON) CR tablet 40 mEq (40 mEq Oral Given 10/24/17 1421)  sodium chloride 0.9 % bolus 1,000 mL (1,000 mLs Intravenous New Bag/Given 10/24/17 1431)  heparin bolus via infusion 4,000 Units (4,000 Units Intravenous Bolus from Bag 10/24/17 1434)     Initial Impression / Assessment and Plan / ED Course  I have reviewed the triage vital signs and the nursing notes.  Pertinent labs & imaging results that were available during my care of the patient were reviewed by me and  considered in my medical decision making (see chart for details).     Pt presenting with acute onset of substernal chest pressure, with nausea and diaphoresis. Pt with risk factors for cardiac disease. Trop on arrival 0.29. Repeat trop 5.09. Nonischemic changes on EKG. Pt hypotensive on my evaluation. Hypotension appears to be transient, because repeat BP 131/93. Given ongoing diverticulitis this month without improvement, zosyn started. IVF given. Heparin ordered per pharmacy. Cardiology consulted for NSTEMI and to see patient for admission.   Patient discussed with and seen by Dr. Venora Maples.  The patient appears reasonably stabilized for admission considering the current resources, flow, and capabilities available in the ED at this time, and I doubt any other [facility] requiring further screening and/or treatment in the ED prior to admission.  Final Clinical Impressions(s) / ED Diagnoses   Final diagnoses:  NSTEMI (non-ST elevated myocardial infarction) [facility])    ED Discharge Orders    None       Robinson, [place] N, PA-C 10/24/17 1506    Jola Schmidt, MD 10/24/17 1512

## 2017-10-24 NOTE — Progress Notes (Signed)
ANTICOAGULATION CONSULT NOTE - Initial Consult  Pharmacy Consult for heparin Indication: chest pain/ACS  Allergies  Allergen Reactions  . Aspirin Other (See Comments)    Will flare up gout Will flare up gout    Patient Measurements:   Heparin Dosing Weight: 72 kg  Vital Signs: Temp: 96.6 F (35.9 C) (11/19 1030) Temp Source: Rectal (11/19 1030) BP: 117/73 (11/19 1250) Pulse Rate: 70 (11/19 1250)  Labs: Recent Labs    10/22/17 0838 10/24/17 1029  HGB 11.1* 11.6*  HCT 34.6* 36.7*  PLT 283 317  CREATININE 1.32* 1.15    Estimated Creatinine Clearance: 61.7 mL/min (by C-G formula based on SCr of 1.15 mg/dL).   Medical History: Past Medical History:  Diagnosis Date  . Abdominal pain   . Acid reflux   . Alcohol screening   . Arthropathy   . Cardiac risk counseling   . Chest pain   . Colon polyp   . Diarrhea   . Diverticulitis   . Dizziness   . Fatigue   . Gout   . Hematuria   . Hepatitis A age 75  . Hyperlipemia   . Hypogonadism male   . Knee pain   . Need for shingles vaccine   . Night sweats   . Non compliance with medical treatment   . Palpitations   . Pollen allergies   . Rash   . Screening for HIV (human immunodeficiency virus)   . SOB (shortness of breath)   . Special screening for malignant neoplasm of colon   . Urinary frequency   . Vitamin D deficiency      Assessment: 71 yo male with recent evaluation of diverticulitis with small perforation - he was discharged with po abx and recommendation of outpatient procedure. Admitted today with chest pain and nausea. Trop +  SCr 1.1, hgb 11.6, plts wnl To start heparin infusion   Goal of Therapy:  Heparin level 0.3-0.7 units/ml Monitor platelets by anticoagulation protocol: Yes    Plan:  -heparin bolus 4000 units x1 then 950 units/hr -daily HL, CBC -Level this evening -F/u cardiology plans  Harvel Quale 10/24/2017,2:00 PM

## 2017-10-24 NOTE — ED Notes (Signed)
Critical Labs reported to [facility].Marland Kitchen

## 2017-10-24 NOTE — ED Notes (Signed)
Pureed mealtray ordered

## 2017-10-24 NOTE — Progress Notes (Addendum)
Received patient from ED, assessment completed, VS documented, oriented patient to the room.  CHG bath completed, MRSA swab sent, Will continue to monitor. 

## 2017-10-24 NOTE — Consult Note (Signed)
[facility] Consult: 9:38 AM 10/25/2017  LOS: 1 day    Referring Provider: Dr Harrington Challenger  Primary Care Physician:  [name], PA-C Primary Gastroenterologist:  Dr. Fuller Plan   Reason for Consultation:  Diverticular abscess   HPI: Dean [name] is a 71 y.o. male.  PMH tophaceous gout, GERD.  Diverticulitis. PVCs.  2014 or 2015 Colonoscopy in [place], Dr Harrell Lark:  Diverticulosis.  Pt denies hx of colon polyps, though this is listed (now removed) from his records.    Chronic mid abdominal pain, lasting 1 to 3 days, scoring ~ 2/10.  2 separate, unremarkable ultrasounds.  1 year of anorexia, weight drop 40# in 2 years.  More recent, non-bloody, mucoid diarrhea, fatigue.  IDA on labs.  More recently the bouts of abd pain have actually improved but on 10/07/17 CT abdomen pelvis: marked inflammatory thickening and stranding of the sigmoid colon consistent with acute diverticulitis ,focally perforated, with the adjacent gas fluid measuring 2.7 and 2.9 cm.  Admitted overnight 11/2 to surgical service, received Zosyn.  WBCs 11.9 >> 5.4.  Afebrile. He felt better, was tolerating diet and pain free. He was adamant about discharge.  Sent home to finish 10 days Cipro, 14 days Flagyl.  Stopped Flagyl after 2 days due to bad taste in his mouth.  Finished Cipro.   First OV ever with [facility] on 11/16: no abd pain.  Low grade fevers.  No nausea or anorexia.  Soft, non-bloody stools.   CT abdomen pelvis repeated 11/16: not improved.  2 extraluminal air-fluid collections with thickened walls corresponding to previous sites of contained perforation.  The 2.9 x 3.2 cm anterior/inferior collection extending to dome of urinary bladder, possibly contiguous with the bladder and associated with bladder wall thickening.  Rx for Augmentin added.   Told to go to ED and to expect admission.  Seen in ED 11/17. WBCs normal, no fever. Seen by Dr Ninfa Linden, surgeon on call, and plan was discharge, complete 14 days Augmentin, outpt surgical follow up, future repeat  CT.  Back to ED 11/19 with 2 hours chest pain, sweats, nausea.  Paroxysmal A fib.  Hypokalemia.   Troponin I 0.29 >> 5.09 >> 12 >> 9.  Started Heparin, ASA.  Received 1 time dose of Zosyn.   WBCs normal.  K 3.0.  Albumin 3.1 but normal LFTs.  Sed Rate 91.   Pt still having very little in way of GI issues, pain is minimal.  Nausea only since yesterday. Occasional use Indocin (last was within past 5 days) and Aleve maybe a few times per year.     No Fm hx of GI cancers or dz.   Retired Barista, and more recently Landscape architect.  Moved from [place] in [place] to [place] a few years ago.  Has friend's but no family in [place].  Rare if any ETOH, as it worsen's his gout.     Past Medical History:  Diagnosis Date  . Acid reflux   . Arthropathy   . Atrial fibrillation ([place]) 10/2017  .  Colon polyp   . Diverticulitis   . Dizziness   . Fatigue   . Gout   . Hepatitis A age 70  . Hyperlipemia   . Hypogonadism male   . Knee pain   . Night sweats   . Non compliance with medical treatment   . Non-STEMI (non-ST elevated myocardial infarction) ([place]) 10/2017  . Palpitations   . Seasonal allergies   . SOB (shortness of breath)   . Vitamin D deficiency     Past Surgical History:  Procedure Laterality Date  . NONE      Prior to Admission medications   Medication Sig Start Date End Date Taking? Authorizing Provider  acetaminophen (TYLENOL) 325 MG tablet Take 325 mg every 6 (six) hours as needed by mouth for mild pain.   Yes [provider]  amoxicillin-clavulanate (AUGMENTIN) 875-125 MG tablet Take 1 tablet 2 (two) times daily for 14 days by mouth. 10/21/17 11/04/17 Yes [facility], Amy S, PA-C  Cholecalciferol (VITAMIN D3) 5000 units CAPS Take 5,000  Units daily by mouth.    Yes [provider]  febuxostat (ULORIC) 40 MG tablet Take 40 mg by mouth daily.   Yes [provider]  hydrocortisone cream 1 % Apply 1 application as needed topically for itching.   Yes [provider]  indomethacin (INDOCIN) 50 MG capsule Take 50 mg daily as needed by mouth for mild pain (gout flare up).  03/02/17  Yes [provider]    Scheduled Meds: . aspirin EC  81 mg Oral Daily  . clopidogrel  75 mg Oral Daily  . febuxostat  40 mg Oral Daily  . metoprolol tartrate  25 mg Oral BID  . rosuvastatin  20 mg Oral q1800  . sodium chloride flush  3 mL Intravenous Q12H   Infusions: . sodium chloride    . sodium chloride 75 mL/hr at 10/24/17 2309  . heparin 1,200 Units/hr (10/25/17 0526)   PRN Meds:    Allergies as of 10/24/2017 - Review Complete 10/24/2017  Allergen Reaction Noted  . Aspirin Other (See Comments) 04/30/2016    Family History  Problem Relation Age of Onset  . Hyperlipidemia Mother   . Alzheimer's disease Mother   . Hyperlipidemia Brother   . Alzheimer's disease Maternal Grandmother   . Colon cancer Neg Hx   . Esophageal cancer Neg Hx   . Stomach cancer Neg Hx     Social History   Socioeconomic History  . Marital status: Single    Spouse name: Not on file  . Number of children: Not on file  . Years of education: Not on file  . Highest education level: Not on file  Social Needs  . Financial resource strain: Not on file  . Food insecurity - worry: Not on file  . Food insecurity - inability: Not on file  . Transportation needs - medical: Not on file  . Transportation needs - non-medical: Not on file  Occupational History  . Occupation: retired  Tobacco Use  . Smoking status: Former Research scientist (life sciences)  . Smokeless tobacco: Never Used  Substance and Sexual Activity  . Alcohol use: Yes    Alcohol/week: 0.0 oz    Comment: once monthly  . Drug use: No  . Sexual activity: Not on file  Other Topics  Concern  . Not on file  Social History Narrative  . Not on file    REVIEW OF SYSTEMS: Constitutional:  No weakness.  Some fatigue ENT:  No nose bleeds Pulm:  No cough or dyspnea CV:  + PVCs and occasional chest pain with exertion.  Dr Acie Fredrickson was planning further studies.   no LE edema.  No syncope GU:  Long-standing urinary frequency but no discolored or foul smelling urine, no dysuria.   GI:  Per HPI.  No dysphagia Heme:  No unususal bleeding or bruising   Transfusions:  None ever.  No previous iron/B12/Folate.   Neuro:  No headaches, no peripheral tingling or numbness Derm:  No itching, no rash or sores.  Endocrine:  No sweats or chills.  No polyuria or dysuria Immunization:  Flu shot planned this admission Travel:  None beyond local counties in last few months.    PHYSICAL EXAM: Vital signs in last 24 hours: Vitals:   10/24/17 2042 10/25/17 0500  BP: 119/83 113/79  Pulse: 69 61  Resp: 18   Temp: (!) 97.3 F (36.3 C) (!) 97.5 F (36.4 C)  SpO2: 100% 100%   Wt Readings from Last 3 Encounters:  10/25/17 73.1 kg (161 lb 1.6 oz)  10/22/17 72.6 kg (160 lb)  10/21/17 74.8 kg (165 lb)    General: pleasant, comfortable, well appearing WM.   Head: No facial asymmetry or swelling.  No signs of head trauma. Eyes: No scleral icterus.  No conjunctival pallor. Ears: Not hard of hearing. Nose: No congestion, no discharge. Mouth: Moist, clear, pink oral mucosa.  Tongue midline.  Healthy dentition. Neck: No JVD, no masses, no bruits. Lungs: Clear bilaterally without labored breathing.  No cough. Heart: Sinus rhythm with multiple PVCs on telemetry, rate in the 60s. Abdomen: Soft.  Nontender.  Not distended.  No masses, bruits, hernias.  Active bowel sounds..   Rectal: Deferred DRE. Musc/Skeltl: No joint swelling or gross deformity. Extremities: No CCE. Neurologic: Oriented x3.  Moves all 4 limbs.  No tremor.  No obvious deficits. Skin: No rashes, sores, significant purpura or  bruising.  No suspicious lesions. Tattoos: None observed Nodes: No cervical adenopathy. Psych: Pleasant, calm, cooperative.  Intake/Output from previous day: 11/19 0701 - 11/20 0700 In: 1399.3 [I.V.:349.3; IV Piggyback:1050] Out: 150 [Urine:150] Intake/Output this shift: No intake/output data recorded.  LAB RESULTS: Recent Labs    10/24/17 1029 10/25/17 0341  WBC 6.8 6.5  HGB 11.6* 10.3*  HCT 36.7* 32.8*  PLT 317 224   BMET Lab Results  Component Value Date   NA 138 10/25/2017   NA 137 10/24/2017   NA 134 (L) 10/22/2017   K 4.7 10/25/2017   K 3.0 (L) 10/24/2017   K 3.1 (L) 10/22/2017   CL 110 10/25/2017   CL 106 10/24/2017   CL 104 10/22/2017   CO2 19 (L) 10/25/2017   CO2 23 10/24/2017   CO2 22 10/22/2017   GLUCOSE 93 10/25/2017   GLUCOSE 135 (H) 10/24/2017   GLUCOSE 109 (H) 10/22/2017   BUN 10 10/25/2017   BUN 11 10/24/2017   BUN 12 10/22/2017   CREATININE 1.16 10/25/2017   CREATININE 1.15 10/24/2017   CREATININE 1.32 (H) 10/22/2017   CALCIUM 8.7 (L) 10/25/2017   CALCIUM 9.0 10/24/2017   CALCIUM 8.7 (L) 10/22/2017   LFT No results for input(s): PROT, ALBUMIN, AST, ALT, ALKPHOS, BILITOT, BILIDIR, IBILI in the last 72 hours. PT/INR Lab Results  Component Value Date   INR 1.16 10/25/2017   Hepatitis Panel No results for input(s): HEPBSAG, HCVAB, HEPAIGM, HEPBIGM in the last 72 hours. C-Diff No components found for: CDIFF Lipase     Component Value Date/Time   LIPASE 32  10/22/2017 0838    Drugs of Abuse  No results found for: LABOPIA, COCAINSCRNUR, LABBENZ, AMPHETMU, THCU, LABBARB   RADIOLOGY STUDIES: Dg Chest 2 View  Result Date: 10/24/2017 CLINICAL DATA:  Left chest pain beginning this morning. EXAM: CHEST  2 VIEW COMPARISON:  None. FINDINGS: Lungs are clear. Heart size is normal. No pneumothorax or pleural effusion. No focal bony abnormality. Contrast in the colon from CT abdomen and pelvis 10/21/2017 noted. IMPRESSION: No acute disease.  Electronically Signed   By: Inge Rise M.D.   On: 10/24/2017 10:49     IMPRESSION:   *  Diverticulitis with contained perforation and abscesses, with urinary bladder involvement.  Treated with cipro x 10 days and augmentin for 2 days PTA.  Now Zosyn.  Pt hardly symptomatic, no fevers and normal WBCs, despite CT findings. However ESR is elevated.  Surgery suggestion: complete another course of Abx and repeat CT.     *  Non STEMI.  Atrial Fib.  On Heparin, Plavix.  Cath planned today.      *  Normocytic anemia.    *  Hypocalcemia, when corrected for low albumin, calcium level is normal.    *  Hypoalbuminemia, weight loss.  Suspect protein cal malnutrition.     PLAN:     *   Resume Zosyn while inpt.  Complete at least 10 more days of abx, surgical plan was for 2 weeks therapy. Follow up with [facility] and they will arrange fup CT.  ? IR eval for opinion re perc drainage.   ? Outpt colonoscopy after resolution of abscesses?  *  Cath today.      *  Prealbumin, anemia in AM.  Added Famotidine for current nausea and hx GERD.  No PPI or H2 blocker at home.     [name]  10/25/2017, 9:38 AM Pager: [phone]   Attending physician's note   I have taken a history, examined the patient and reviewed the chart. I agree with the Advanced Practitioner's note, impression and recommendations. Diverticulitis with contained perforation, small in size do not think we need to drain them . Clinically asymptomatic. Continue IV Zosyn for total 10 days. Patient had colonoscopy within last 3-4 years and had no polyps per patient. Will try to obtain the report. No need to repeat colonoscopy given is up to date for colorectal cancer screening. Repeat CT abd & pelvis with contrast in 2-4 weeks to document resolution of abscess. 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

## 2017-10-24 NOTE — H&P (Signed)
History & Physical    Patient ID: Dean [name] MRN: [id number], DOB/AGE: [birth year]   Admit date: 10/24/2017  Primary Physician: [name], PA-C Primary Cardiologist: Mertie Moores, MD   Patient Profile    Dean [name] os a 71yo male with PMH of gout, GERD, diverticulitis (recent admission with 2 small perforations) presenting to [facility] with chest pain.  Past Medical History    Past Medical History:  Diagnosis Date  . Abdominal pain   . Acid reflux   . Alcohol screening   . Arthropathy   . Cardiac risk counseling   . Chest pain   . Colon polyp   . Diarrhea   . Diverticulitis   . Dizziness   . Fatigue   . Gout   . Hematuria   . Hepatitis A age 60  . Hyperlipemia   . Hypogonadism male   . Knee pain   . Need for shingles vaccine   . Night sweats   . Non compliance with medical treatment   . Palpitations   . Pollen allergies   . Rash   . Screening for HIV (human immunodeficiency virus)   . SOB (shortness of breath)   . Special screening for malignant neoplasm of colon   . Urinary frequency   . Vitamin D deficiency     Past Surgical History:  Procedure Laterality Date  . NONE       Allergies  Allergies  Allergen Reactions  . Aspirin Other (See Comments)    Will flare up gout Will flare up gout    History of Present Illness    Dean [name] is a 71 yo who presents with acute onset of chest pain this morning (8AM) associated with shortness of breath, weakness, palpitations, diaphoresis, rigors and nausea; patient was about to go for LE dopplers for evaluation of DVT but pain began after walking down 3 flights of stairs; he stayed in his car for 41mins prior to driving to the hospital and then stayed another 45 minutes before he felt well enough to walk in (wasn't sure if he should go to ED or urgent care). Patient reports over the last year or so having exertional chest discomfort on  And off   that has recently progressed to chest pain at rest which would  also wake him up.  At time of evaluation, patient states chest pain resolved about an hour prior (11AM) and he feels back to his baseline.  Patient has been seen by Dr. Acie Fredrickson for evaluation of PVCs; he had an echo done in Aug which showed G1DD and a stress test was planned but placed on hold until his diverticulitis has been addressed.   Patient endorses being a former smoker (quit 2yrs ago); denies early death or MI in family history. He states he was on crestor in the past for HLD but had to stop due to myalgias.   Patient was recently admitted for diverticulitis with two small perforation which was treated with IV antibiotics. Patient is currently on oral augmentin and reporting feeling significantly better than prior to last admission; he still has some loose stools but denies significant abd pain and fevers.   In the ED, initial istat trop was 0.3 and trended up to 5.0. He was started on heparin gtt per pharmacy dosing.   Home Medications    Prior to Admission medications   Medication Sig Start Date End Date Taking? Authorizing Provider  acetaminophen (TYLENOL) 325 MG tablet Take 325 mg every 6 (six)  hours as needed by mouth for mild pain.    [provider]  amoxicillin-clavulanate (AUGMENTIN) 875-125 MG tablet Take 1 tablet 2 (two) times daily for 14 days by mouth. 10/21/17 11/04/17  Esterwood, Amy S, PA-C  Cholecalciferol (VITAMIN D3) 5000 units CAPS Take 5,000 Units daily by mouth.     [provider]  febuxostat (ULORIC) 40 MG tablet Take 40 mg by mouth daily.    [provider]  hydrocortisone cream 1 % Apply 1 application as needed topically for itching.    [provider]  indomethacin (INDOCIN) 50 MG capsule Take 50 mg by mouth as directed. Take 50 mg by mouth as needed at the first sign of gout 03/02/17   [provider]    Family History    Family History  Problem Relation Age of Onset  . Hyperlipidemia Mother   . Alzheimer's  disease Mother   . Hyperlipidemia Brother   . Alzheimer's disease Maternal Grandmother   . Colon cancer Neg Hx   . Esophageal cancer Neg Hx   . Stomach cancer Neg Hx    Social History    Social History   Socioeconomic History  . Marital status: Single    Spouse name: Not on file  . Number of children: Not on file  . Years of education: Not on file  . Highest education level: Not on file  Social Needs  . Financial resource strain: Not on file  . Food insecurity - worry: Not on file  . Food insecurity - inability: Not on file  . Transportation needs - medical: Not on file  . Transportation needs - non-medical: Not on file  Occupational History  . Occupation: retired  Tobacco Use  . Smoking status: Former Research scientist (life sciences)  . Smokeless tobacco: Never Used  Substance and Sexual Activity  . Alcohol use: Yes    Alcohol/week: 0.0 oz    Comment: once monthly  . Drug use: No  . Sexual activity: Not on file  Other Topics Concern  . Not on file  Social History Narrative  . Not on file     Review of Systems   All other systems reviewed and are otherwise negative except as noted above.  Physical Exam    Blood pressure 117/73, pulse 70, temperature (!) 96.6 F (35.9 C), temperature source Rectal, resp. rate 16, SpO2 100 %.  General: Pleasant, NAD, lying in bed reading a book Psych: Normal mood and affect. Neuro: Alert and oriented X 3. Moves all extremities spontaneously. HEENT: Normal, moist mucous membranes  Neck: Supple without bruits or JVD. Lungs:  Resp regular and unlabored, CTA without rales, rhonchi or wheezing. Heart: RRR, no s3, s4, or murmurs. Abdomen: Soft, non-tender, non-distended, +BS.  Extremities: 1+ LLE swelling - warm, no erythema, cords or calf tenderness. DP/PT/Radials 2+ and equal bilaterally.  Labs    Troponin [facility] of Care Test) Recent Labs    10/24/17 1323  TROPIPOC 5.09*   No results for input(s): CKTOTAL, CKMB, TROPONINI in the last 72 hours. Lab  Results  Component Value Date   WBC 6.8 10/24/2017   HGB 11.6 (L) 10/24/2017   HCT 36.7 (L) 10/24/2017   MCV 86.4 10/24/2017   PLT 317 10/24/2017    Recent Labs  Lab 10/22/17 0838 10/24/17 1029  NA 134* 137  K 3.1* 3.0*  CL 104 106  CO2 22 23  BUN 12 11  CREATININE 1.32* 1.15  CALCIUM 8.7* 9.0  PROT 7.1  --  BILITOT 0.6  --   ALKPHOS 58  --   ALT 7*  --   AST 19  --   GLUCOSE 109* 135*   No results found for: CHOL, HDL, LDLCALC, TRIG No results found for: [facility]   Radiology Studies    Dg Chest 2 View  Result Date: 10/24/2017 CLINICAL DATA:  Left chest pain beginning this morning. EXAM: CHEST  2 VIEW COMPARISON:  None. FINDINGS: Lungs are clear. Heart size is normal. No pneumothorax or pleural effusion. No focal bony abnormality. Contrast in the colon from CT abdomen and pelvis 10/21/2017 noted. IMPRESSION: No acute disease. Electronically Signed   By: Inge Rise M.D.   On: 10/24/2017 10:49   Ct Abdomen Pelvis W Contrast  Result Date: 10/21/2017 CLINICAL DATA:  Follow-up known perforation with abscess. Evaluate for resolution. EXAM: CT ABDOMEN AND PELVIS WITH CONTRAST TECHNIQUE: Multidetector CT imaging of the abdomen and pelvis was performed using the standard protocol following bolus administration of intravenous contrast. CONTRAST:  173mL ISOVUE-300 IOPAMIDOL (ISOVUE-300) INJECTION 61% COMPARISON:  CT abdomen dated 10/07/2017. FINDINGS: Lower chest: No acute abnormality. Hepatobiliary: Tiny hypodense lesion within the right liver lobe is too small to characterize, stable compared to the previous study and most likely a benign cyst. Incidental note made of focal fatty infiltration in the left liver lobe. No suspicious mass or lesion within the liver. Gallbladder appears normal. No bile duct dilatation. Pancreas: Unremarkable. No pancreatic ductal dilatation or surrounding inflammatory changes. Spleen: Normal in size without focal abnormality. Adrenals/Urinary Tract:  Adrenal glands appear normal. Kidneys are unremarkable without mass, stone or hydronephrosis. Bladder is decompressed. Stomach/Bowel: Persistent thickening of the walls of the sigmoid colon, compatible with sequela of recent diverticulitis. Adjacent extraluminal collection of air and fluid (mostly air), with surrounding thick wall consistent with residual abscess, is again seen along the anterior inferior margin of this affected portion of the sigmoid colon. This collection measures 2.9 cm greatest dimension. The wall of the collection and/or surrounding edema extends inferiorly to the dome of the bladder, possibly contiguous (coronal series 6, images 17 through 23 ; axial series 2, images 71 through 77). Additional extraluminal collection just above the affected portion of the sigmoid colon, likely multiloculated, is stable, altogether measuring 3.2 cm greatest dimension (coronal series 6, image 29 ; axial series 2, image 64). No dilated large or small bowel loops. Moderate amount of stool throughout the nondistended colon. Fairly extensive diverticulosis again noted within the sigmoid and descending colon. Vascular/Lymphatic: Aortic atherosclerosis. No acute appearing vascular abnormality. No enlarged lymph nodes seen. Reproductive: Prostate is unremarkable. Other: No new fluid collection or abscess. No free intraperitoneal air. Musculoskeletal: No acute findings. Mild degenerative spurring throughout the thoracolumbar spine. Small bilateral inguinal hernias which contain fat only. IMPRESSION: 1. No significant interval change. Two extraluminal collections of air and fluid (mostly air), with thick circumferential walls, corresponding to the previously described sites of contained perforation, appear stable in size and configuration compared to the earlier CT of 10/07/2017. 2. Of note, the wall of the more anterior-inferior collection extends inferiorly to the dome of the bladder, possibly contiguous. There is  associated thickening of the walls of the superior-anterior bladder. No air within the bladder to suggest fistula. 3. Thickening of the walls of the sigmoid colon, stable, compatible with recent diverticulitis. No new extraluminal collection. 4. Aortic atherosclerosis. Electronically Signed   By: Franki Cabot M.D.   On: 10/21/2017 15:51   Ct Abdomen Pelvis W Contrast  Result Date: 10/07/2017  CLINICAL DATA:  Patient with lower pelvic pain.  Diarrhea. EXAM: CT ABDOMEN AND PELVIS WITH CONTRAST TECHNIQUE: Multidetector CT imaging of the abdomen and pelvis was performed using the standard protocol following bolus administration of intravenous contrast. CONTRAST:  142mL ISOVUE-300 IOPAMIDOL (ISOVUE-300) INJECTION 61% COMPARISON:  None. FINDINGS: Lower chest: Normal heart size. Subpleural 3 mm nodule right middle lobe (image 4; series 3). Hepatobiliary: The liver is normal in size and contour. Too small to characterize subcentimeter low-attenuation lesion right hepatic lobe (image 21; series 2). Fatty deposition adjacent to the falciform ligament. Gallbladder is unremarkable. No intrahepatic or extrahepatic biliary ductal dilatation. Pancreas: Unremarkable Spleen: Unremarkable Adrenals/Urinary Tract: Normal adrenal glands. Kidneys enhance symmetrically with contrast. No hydronephrosis. Urinary bladder is decompressed. Probable 2 mm nonobstructing stone inferior pole left kidney (image 59; series 5). Stomach/Bowel: Short segment marked inflammatory thickening and stranding of the sigmoid colon most compatible with acute diverticulitis. There are focal areas of contained perforation which result in a 2.7 x 1.7 cm air and fluid collection cranial to the sigmoid colon and a 2.9 cm air-fluid collection anterior to the sigmoid colon (image 72; series 2). No evidence for upstream bowel obstruction. 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 Aortic atherosclerosis. No retroperitoneal  lymphadenopathy. The right common iliac artery is dilated measuring 2.2 cm and the left common iliac artery is dilated measuring 2.1 cm. Reproductive: Prostate unremarkable. The testicles appear retracted bilaterally. Probable right hydrocele. Other: Bilateral fat containing inguinal hernias. Musculoskeletal: Lumbar spine degenerative changes. No aggressive or acute appearing osseous lesions. IMPRESSION: 1. Findings compatible with focally perforated (contained) acute sigmoid diverticulitis with adjacent air and gas fluid collections superior and anterior to the inflamed sigmoid colon measuring 2.7 and 2.9 cm. 2. 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 Recommend followup by ultrasound in 5 years. This recommendation follows ACR consensus guidelines: White Paper of the ACR Incidental Findings Committee II on Vascular Findings. J Am Coll Radiol 2013; 10:789-794. 3. These results were called by telephone at the time of interpretation on 10/07/2017 at 1:56 pm to Dr. Havery Moros, who verbally acknowledged these results. Electronically Signed   By: Lovey Newcomer M.D.   On: 10/07/2017 14:02    ECG & Cardiac Imaging    Echo 07/27/2017: - Left ventricle: The cavity size was normal. There was mild focal   basal hypertrophy of the septum. Systolic function was normal.   The estimated ejection fraction was in the range of 55% to 60%.   Wall motion was normal; there were no regional wall motion   abnormalities. Doppler parameters are consistent with abnormal   left ventricular relaxation (grade 1 diastolic dysfunction).   There was no evidence of elevated ventricular filling pressure by   Doppler parameters. - Aortic valve: There was no regurgitation. - Mitral valve: There was trivial regurgitation. - Left atrium: The atrium was normal in size. - Right ventricle: Systolic function was normal. - Right atrium: The atrium was normal in size. - Tricuspid  valve: There was no significant regurgitation. - Pulmonic valve: There was no regurgitation. - Pulmonary arteries: Systolic pressure could not be accurately   estimated. - Inferior vena cava: The vessel was normal in size. - Pericardium, extracardiac: There was no pericardial effusion  EKG 10/24/17, personally reviewed - Atrial fibrillation 119 bpm with PVCs  Incomp LBBB  ST depression in lateral leads Later EKG shows SR with PVCs  Assessment & Plan    NSTEMI: Trops 0.3>5.0; EKG  with new incomplete LBBB and ST depressions. Heparin was started by EDP; he would benefit from [facility] this admission as well as evaluation for risk reduction. Not on BB or statin prior to this admission; reports prior myalgias with lipitor years ago. Renal function is wnl, Hgb stable, radial pulses 2+ bil. --ASA (not allergic  Told it could exacerbate gout), Plavix heparin --metoprolol 25mg  BID  --cath for tomorrow; pre cath orders in --f/u A1c, AM lipid panel, Bmet, CBC  Paroxysmal A fib  Pt told he had in past  (CHADSVASC 2 at least age/vasc dz) Seen on EKGs and tele today and on ED visit 11/17.  --telemetry --metoprolol --heparin for now  Hypokalemia: K of 3.0; repleting.  --f/u AM Bmet  Diverticulitis: Afebrile, no leukocytosis. Started on Zosyn and blood cultures were drawn.  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

## 2017-10-24 NOTE — ED Notes (Signed)
I Stat Trop I results shown to Dr. Campos 

## 2017-10-24 NOTE — Progress Notes (Signed)
ANTICOAGULATION CONSULT NOTE - FOLLOW UP    HL = 0.24 (goal 0.3 - 0.7 units/mL) Heparin dosing weight = 72 kg   Assessment: 56 YOM with recent evaluation of diverticulitis with small perforation - he was discharged with PO antibiotic and recommendation of outpatient procedure.  Patient returned today with chest pain and nausea.   Pharmacy consulted to manage IV heparin for ACS.  Heparin level is slightly sub-therapeutic.  No bleeding nor issue with heparin infusion per RN.   Plan: Increase heparin gtt to 1100 units/hr F/U AM labs   Dean [name] D. Mina Marble, PharmD, BCPS 10/24/2017, 9:19 PM

## 2017-10-24 NOTE — ED Triage Notes (Addendum)
Patient complains of chest pain x 2 hours with nausea and diaphoresis, patient states that the pain was worse initially and now feeling better, alert and oriented and just here over weekend for GI complaint. Patient skin cool to touch and placed on stretcher to get rectal temp. Currently CP minimal.

## 2017-10-24 NOTE — ED Notes (Signed)
Patient requesting prn indomethacin for gout flare up. Paged on call MD.

## 2017-10-25 ENCOUNTER — Inpatient Hospital Stay (HOSPITAL_COMMUNITY): Payer: Medicare Other

## 2017-10-25 ENCOUNTER — Encounter (HOSPITAL_COMMUNITY)
Admission: EM | Disposition: A | Discharge status: Home / Self Care | Payer: Self-pay | Source: Home / Self Care | Attending: Surgery

## 2017-10-25 ENCOUNTER — Encounter (HOSPITAL_COMMUNITY): Payer: Self-pay | Admitting: Physician Assistant

## 2017-10-25 DIAGNOSIS — I351 Nonrheumatic aortic (valve) insufficiency: Secondary | ICD-10-CM

## 2017-10-25 DIAGNOSIS — I340 Nonrheumatic mitral (valve) insufficiency: Secondary | ICD-10-CM

## 2017-10-25 DIAGNOSIS — I214 Non-ST elevation (NSTEMI) myocardial infarction: Principal | ICD-10-CM

## 2017-10-25 DIAGNOSIS — Z01810 Encounter for preprocedural cardiovascular examination: Secondary | ICD-10-CM

## 2017-10-25 DIAGNOSIS — R634 Abnormal weight loss: Secondary | ICD-10-CM

## 2017-10-25 DIAGNOSIS — E8809 Other disorders of plasma-protein metabolism, not elsewhere classified: Secondary | ICD-10-CM

## 2017-10-25 DIAGNOSIS — K5720 Diverticulitis of large intestine with perforation and abscess without bleeding: Secondary | ICD-10-CM

## 2017-10-25 HISTORY — PX: LEFT HEART CATH AND CORONARY ANGIOGRAPHY: CATH118249

## 2017-10-25 LAB — ECHOCARDIOGRAM COMPLETE
AOASC: 29 cm
AVPHT: 449 ms
CHL CUP MV DEC (S): 229
CHL CUP PV REG GRAD DIAS: 6 mmHg
CHL CUP REG VEL DIAS: 123 cm/s
E decel time: 229 msec
FS: 18 % — AB (ref 28–44)
HEIGHTINCHES: 69.5 in
IVS/LV PW RATIO, ED: 0.91
LA ID, A-P, ES: 44 mm
LA diam end sys: 44 mm
LA vol index: 37.8 mL/m2
LADIAMINDEX: 2.34 cm/m2
LAVOL: 71.1 mL
LAVOLA4C: 68 mL
LDCA: 3.8 cm2
LVOT diameter: 22 mm
MRPISAEROA: 0.05 cm2
MV VTI: 152 cm
MV pk A vel: 44.6 m/s
MVPG: 3 mmHg
MVPKEVEL: 87.8 m/s
PW: 11 mm — AB (ref 0.6–1.1)
TAPSE: 27.5 mm
Weight: 2539.7 oz

## 2017-10-25 LAB — PREPARE RBC (CROSSMATCH)

## 2017-10-25 LAB — TSH: TSH: 5.811 u[IU]/mL — AB (ref 0.350–4.500)

## 2017-10-25 LAB — PULMONARY FUNCTION TEST
FEF 25-75 Post: 1.98 L/sec
FEF 25-75 Pre: 1.9 L/sec
FEF2575-%CHANGE-POST: 4 %
FEF2575-%PRED-POST: 81 %
FEF2575-%Pred-Pre: 77 %
FEV1-%CHANGE-POST: 1 %
FEV1-%Pred-Post: 65 %
FEV1-%Pred-Pre: 64 %
FEV1-POST: 2.1 L
FEV1-Pre: 2.07 L
FEV1FVC-%CHANGE-POST: 4 %
FEV1FVC-%PRED-PRE: 104 %
FEV6-%Change-Post: -2 %
FEV6-%Pred-Post: 63 %
FEV6-%Pred-Pre: 64 %
FEV6-POST: 2.6 L
FEV6-Pre: 2.66 L
FEV6FVC-%PRED-POST: 106 %
FEV6FVC-%Pred-Pre: 106 %
FVC-%CHANGE-POST: -3 %
FVC-%PRED-POST: 59 %
FVC-%PRED-PRE: 61 %
FVC-POST: 2.6 L
FVC-PRE: 2.69 L
PRE FEV6/FVC RATIO: 100 %
Post FEV1/FVC ratio: 81 %
Post FEV6/FVC ratio: 100 %
Pre FEV1/FVC ratio: 77 %

## 2017-10-25 LAB — LIPID PANEL
Cholesterol: 128 mg/dL (ref 0–200)
HDL: 39 mg/dL — AB (ref >40)
LDL CALC: 68 mg/dL (ref 0–99)
TRIGLYCERIDES: 104 mg/dL (ref <150)
Total CHOL/HDL Ratio: 3.3 RATIO
VLDL: 21 mg/dL (ref 0–40)

## 2017-10-25 LAB — PROTIME-INR
INR: 1.16
PROTHROMBIN TIME: 14.7 s (ref 11.4–15.2)

## 2017-10-25 LAB — CBC
HCT: 32.8 % — ABNORMAL LOW (ref 39.0–52.0)
HEMOGLOBIN: 10.3 g/dL — AB (ref 13.0–17.0)
MCH: 27.2 pg (ref 26.0–34.0)
MCHC: 31.4 g/dL (ref 30.0–36.0)
MCV: 86.8 fL (ref 78.0–100.0)
Platelets: 224 10*3/uL (ref 150–400)
RBC: 3.78 MIL/uL — ABNORMAL LOW (ref 4.22–5.81)
RDW: 17 % — AB (ref 11.5–15.5)
WBC: 6.5 10*3/uL (ref 4.0–10.5)

## 2017-10-25 LAB — BASIC METABOLIC PANEL
ANION GAP: 9 (ref 5–15)
BUN: 10 mg/dL (ref 6–20)
CALCIUM: 8.7 mg/dL — AB (ref 8.9–10.3)
CO2: 19 mmol/L — AB (ref 22–32)
CREATININE: 1.16 mg/dL (ref 0.61–1.24)
Chloride: 110 mmol/L (ref 101–111)
GLUCOSE: 93 mg/dL (ref 65–99)
Potassium: 4.7 mmol/L (ref 3.5–5.1)
Sodium: 138 mmol/L (ref 135–145)

## 2017-10-25 LAB — HEMOGLOBIN A1C
HEMOGLOBIN A1C: 5.8 % — AB (ref 4.8–5.6)
MEAN PLASMA GLUCOSE: 119.76 mg/dL

## 2017-10-25 LAB — TROPONIN I: TROPONIN I: 9.05 ng/mL — AB (ref <0.03)

## 2017-10-25 LAB — SURGICAL PCR SCREEN
MRSA, PCR: NEGATIVE
Staphylococcus aureus: NEGATIVE

## 2017-10-25 LAB — APTT: aPTT: 77 seconds — ABNORMAL HIGH (ref 24–36)

## 2017-10-25 LAB — HEPARIN LEVEL (UNFRACTIONATED): HEPARIN UNFRACTIONATED: 0.31 [IU]/mL (ref 0.30–0.70)

## 2017-10-25 LAB — ABO/RH: ABO/RH(D): A POS

## 2017-10-25 SURGERY — LEFT HEART CATH AND CORONARY ANGIOGRAPHY
Anesthesia: LOCAL

## 2017-10-25 MED ORDER — SODIUM CHLORIDE 0.9% FLUSH: 3.0000 mL | INTRAVENOUS | Status: DC | PRN

## 2017-10-25 MED ORDER — VERAPAMIL HCL 2.5 MG/ML IV SOLN
INTRAVENOUS | Status: AC
  Filled 2017-10-25: qty 2

## 2017-10-25 MED ORDER — TRANEXAMIC ACID (OHS) BOLUS VIA INFUSION
15.0000 mg/kg | INTRAVENOUS | Status: AC
  Administered 2017-10-26: 1080 mg via INTRAVENOUS
  Filled 2017-10-25: qty 1080

## 2017-10-25 MED ORDER — FAMOTIDINE 20 MG PO TABS
20.0000 mg | ORAL_TABLET | Freq: Two times a day (BID) | ORAL | Status: DC
  Administered 2017-10-25: 20 mg via ORAL
  Filled 2017-10-25: qty 1

## 2017-10-25 MED ORDER — IOPAMIDOL (ISOVUE-370) INJECTION 76%
INTRAVENOUS | Status: DC | PRN
  Administered 2017-10-25: 90 mL via INTRA_ARTERIAL

## 2017-10-25 MED ORDER — SODIUM CHLORIDE 0.9 % IV SOLN: 250.0000 mL | INTRAVENOUS | Status: DC | PRN

## 2017-10-25 MED ORDER — DOPAMINE-DEXTROSE 3.2-5 MG/ML-% IV SOLN
0.0000 ug/kg/min | INTRAVENOUS | Status: AC
  Administered 2017-10-26: 5 ug/kg/min via INTRAVENOUS
  Filled 2017-10-25: qty 250

## 2017-10-25 MED ORDER — ALPRAZOLAM 0.25 MG PO TABS: 0.2500 mg | ORAL_TABLET | ORAL | Status: DC | PRN

## 2017-10-25 MED ORDER — VERAPAMIL HCL 2.5 MG/ML IV SOLN
INTRAVENOUS | Status: DC | PRN
  Administered 2017-10-25: 11:00:00 via INTRA_ARTERIAL

## 2017-10-25 MED ORDER — LIDOCAINE HCL (PF) 1 % IJ SOLN
INTRAMUSCULAR | Status: AC
  Filled 2017-10-25: qty 30

## 2017-10-25 MED ORDER — LIDOCAINE HCL (PF) 1 % IJ SOLN
INTRAMUSCULAR | Status: DC | PRN
  Administered 2017-10-25: 2 mL

## 2017-10-25 MED ORDER — NITROGLYCERIN IN D5W 200-5 MCG/ML-% IV SOLN
2.0000 ug/min | INTRAVENOUS | Status: AC
  Administered 2017-10-26: 16.6 ug/min via INTRAVENOUS
  Filled 2017-10-25: qty 250

## 2017-10-25 MED ORDER — ROSUVASTATIN CALCIUM 10 MG PO TABS
40.0000 mg | ORAL_TABLET | Freq: Every day | ORAL | Status: DC
  Administered 2017-10-25 – 2017-11-04 (×10): 40 mg via ORAL
  Filled 2017-10-25 (×3): qty 4
  Filled 2017-10-25 (×2): qty 2
  Filled 2017-10-25 (×3): qty 4
  Filled 2017-10-25: qty 1
  Filled 2017-10-25: qty 4
  Filled 2017-10-25: qty 2

## 2017-10-25 MED ORDER — PLASMA-LYTE 148 IV SOLN
INTRAVENOUS | Status: AC
  Administered 2017-10-26: 500 mL
  Filled 2017-10-25: qty 2.5

## 2017-10-25 MED ORDER — VANCOMYCIN HCL 10 G IV SOLR
1250.0000 mg | INTRAVENOUS | Status: AC
  Administered 2017-10-26: 1250 mg via INTRAVENOUS
  Filled 2017-10-25: qty 1250

## 2017-10-25 MED ORDER — TEMAZEPAM 15 MG PO CAPS: 15.0000 mg | ORAL_CAPSULE | Freq: Once | ORAL | Status: DC | PRN

## 2017-10-25 MED ORDER — SODIUM CHLORIDE 0.9 % IV SOLN: INTRAVENOUS | Status: AC

## 2017-10-25 MED ORDER — PROMETHAZINE HCL 25 MG PO TABS
25.0000 mg | ORAL_TABLET | Freq: Once | ORAL | Status: AC
  Administered 2017-10-25: 25 mg via ORAL
  Filled 2017-10-25: qty 1

## 2017-10-25 MED ORDER — HEPARIN (PORCINE) IN NACL 2-0.9 UNIT/ML-% IJ SOLN
INTRAMUSCULAR | Status: AC | PRN
  Administered 2017-10-25: 1000 mL via INTRA_ARTERIAL

## 2017-10-25 MED ORDER — SODIUM CHLORIDE 0.9 % IV SOLN
INTRAVENOUS | Status: AC
  Administered 2017-10-26: 1.5 [IU]/h via INTRAVENOUS
  Filled 2017-10-25: qty 1

## 2017-10-25 MED ORDER — HYDROCORTISONE 2.5 % RE CREA
1.0000 "application " | TOPICAL_CREAM | Freq: Four times a day (QID) | RECTAL | Status: DC | PRN
  Filled 2017-10-25 (×4): qty 28.35

## 2017-10-25 MED ORDER — DEXTROSE 5 % IV SOLN
1.5000 g | INTRAVENOUS | Status: AC
  Administered 2017-10-26: 1.5 g via INTRAVENOUS
  Administered 2017-10-26: .75 g via INTRAVENOUS
  Filled 2017-10-25: qty 1.5

## 2017-10-25 MED ORDER — AMOXICILLIN-POT CLAVULANATE 875-125 MG PO TABS: 1.0000 | ORAL_TABLET | Freq: Two times a day (BID) | ORAL | Status: DC

## 2017-10-25 MED ORDER — METOPROLOL TARTRATE 12.5 MG HALF TABLET: 12.5000 mg | ORAL_TABLET | Freq: Once | ORAL | Status: DC

## 2017-10-25 MED ORDER — IOPAMIDOL (ISOVUE-370) INJECTION 76%
INTRAVENOUS | Status: AC
  Filled 2017-10-25: qty 100

## 2017-10-25 MED ORDER — PHENYLEPHRINE HCL 10 MG/ML IJ SOLN
30.0000 ug/min | INTRAMUSCULAR | Status: DC
  Filled 2017-10-25: qty 2

## 2017-10-25 MED ORDER — CHLORHEXIDINE GLUCONATE CLOTH 2 % EX PADS
6.0000 | MEDICATED_PAD | Freq: Once | CUTANEOUS | Status: AC
  Administered 2017-10-26: 6 via TOPICAL

## 2017-10-25 MED ORDER — DIAZEPAM 5 MG PO TABS
5.0000 mg | ORAL_TABLET | Freq: Once | ORAL | Status: AC
  Administered 2017-10-26: 5 mg via ORAL
  Filled 2017-10-25: qty 1

## 2017-10-25 MED ORDER — HEPARIN SODIUM (PORCINE) 1000 UNIT/ML IJ SOLN
INTRAMUSCULAR | Status: DC | PRN
  Administered 2017-10-25: 4000 [IU] via INTRAVENOUS

## 2017-10-25 MED ORDER — POTASSIUM CHLORIDE 2 MEQ/ML IV SOLN
80.0000 meq | INTRAVENOUS | Status: DC
  Filled 2017-10-25: qty 40

## 2017-10-25 MED ORDER — CHLORHEXIDINE GLUCONATE CLOTH 2 % EX PADS
6.0000 | MEDICATED_PAD | Freq: Once | CUTANEOUS | Status: AC
  Administered 2017-10-25: 6 via TOPICAL

## 2017-10-25 MED ORDER — MAGNESIUM SULFATE 50 % IJ SOLN
40.0000 meq | INTRAMUSCULAR | Status: DC
  Filled 2017-10-25: qty 9.85

## 2017-10-25 MED ORDER — PIPERACILLIN-TAZOBACTAM 3.375 G IVPB
3.3750 g | Freq: Three times a day (TID) | INTRAVENOUS | Status: DC
  Administered 2017-10-25 – 2017-11-03 (×27): 3.375 g via INTRAVENOUS
  Filled 2017-10-25 (×31): qty 50

## 2017-10-25 MED ORDER — HEPARIN SODIUM (PORCINE) 1000 UNIT/ML IJ SOLN
INTRAMUSCULAR | Status: AC
  Filled 2017-10-25: qty 1

## 2017-10-25 MED ORDER — FENTANYL CITRATE (PF) 100 MCG/2ML IJ SOLN
INTRAMUSCULAR | Status: AC
  Filled 2017-10-25: qty 2

## 2017-10-25 MED ORDER — MIDAZOLAM HCL 2 MG/2ML IJ SOLN
INTRAMUSCULAR | Status: DC | PRN
  Administered 2017-10-25: 1 mg via INTRAVENOUS

## 2017-10-25 MED ORDER — TRANEXAMIC ACID (OHS) PUMP PRIME SOLUTION
2.0000 mg/kg | INTRAVENOUS | Status: DC
  Filled 2017-10-25: qty 1.44

## 2017-10-25 MED ORDER — DEXMEDETOMIDINE HCL IN NACL 400 MCG/100ML IV SOLN
0.1000 ug/kg/h | INTRAVENOUS | Status: AC
  Administered 2017-10-26: .5 ug/kg/h via INTRAVENOUS
  Filled 2017-10-25: qty 100

## 2017-10-25 MED ORDER — DEXTROSE 5 % IV SOLN
0.0000 ug/min | INTRAVENOUS | Status: DC
  Filled 2017-10-25: qty 4

## 2017-10-25 MED ORDER — CHLORHEXIDINE GLUCONATE 0.12 % MT SOLN
15.0000 mL | Freq: Once | OROMUCOSAL | Status: AC
  Administered 2017-10-26: 15 mL via OROMUCOSAL
  Filled 2017-10-25: qty 15

## 2017-10-25 MED ORDER — BISACODYL 5 MG PO TBEC: 5.0000 mg | DELAYED_RELEASE_TABLET | Freq: Once | ORAL | Status: DC

## 2017-10-25 MED ORDER — ALBUTEROL SULFATE (2.5 MG/3ML) 0.083% IN NEBU
2.5000 mg | INHALATION_SOLUTION | Freq: Once | RESPIRATORY_TRACT | Status: AC
  Administered 2017-10-25: 2.5 mg via RESPIRATORY_TRACT

## 2017-10-25 MED ORDER — HEPARIN (PORCINE) IN NACL 2-0.9 UNIT/ML-% IJ SOLN
INTRAMUSCULAR | Status: AC
  Filled 2017-10-25: qty 1000

## 2017-10-25 MED ORDER — CEFUROXIME SODIUM 750 MG IJ SOLR
750.0000 mg | INTRAMUSCULAR | Status: DC
  Filled 2017-10-25: qty 750

## 2017-10-25 MED ORDER — HEPARIN (PORCINE) IN NACL 100-0.45 UNIT/ML-% IJ SOLN
1200.0000 [IU]/h | INTRAMUSCULAR | Status: DC
  Administered 2017-10-25: 1200 [IU]/h via INTRAVENOUS
  Filled 2017-10-25: qty 250

## 2017-10-25 MED ORDER — HEPARIN SODIUM (PORCINE) 1000 UNIT/ML IJ SOLN
INTRAMUSCULAR | Status: DC
  Filled 2017-10-25: qty 30

## 2017-10-25 MED ORDER — MIDAZOLAM HCL 2 MG/2ML IJ SOLN
INTRAMUSCULAR | Status: AC
  Filled 2017-10-25: qty 2

## 2017-10-25 MED ORDER — ACETAMINOPHEN 325 MG PO TABS: 650.0000 mg | ORAL_TABLET | ORAL | Status: DC | PRN

## 2017-10-25 MED ORDER — FENTANYL CITRATE (PF) 100 MCG/2ML IJ SOLN
INTRAMUSCULAR | Status: DC | PRN
  Administered 2017-10-25: 25 ug via INTRAVENOUS

## 2017-10-25 MED ORDER — ONDANSETRON HCL 4 MG/2ML IJ SOLN
4.0000 mg | Freq: Three times a day (TID) | INTRAMUSCULAR | Status: DC | PRN
  Administered 2017-10-25: 4 mg via INTRAVENOUS
  Filled 2017-10-25: qty 2

## 2017-10-25 MED ORDER — SODIUM CHLORIDE 0.9% FLUSH
3.0000 mL | Freq: Two times a day (BID) | INTRAVENOUS | Status: DC
  Administered 2017-10-25 (×2): 3 mL via INTRAVENOUS

## 2017-10-25 MED ORDER — TRANEXAMIC ACID 1000 MG/10ML IV SOLN
1.5000 mg/kg/h | INTRAVENOUS | Status: AC
  Administered 2017-10-26: 1.5 mg/kg/h via INTRAVENOUS
  Filled 2017-10-25: qty 25

## 2017-10-25 SURGICAL SUPPLY — 11 items
CATH INFINITI 5FR ANG PIGTAIL (CATHETERS) ×2 IMPLANT
CATH OPTITORQUE TIG 4.0 5F (CATHETERS) ×2 IMPLANT
DEVICE RAD COMP TR BAND LRG (VASCULAR PRODUCTS) ×2 IMPLANT
GLIDESHEATH SLEND A-KIT 6F 22G (SHEATH) ×2 IMPLANT
GUIDEWIRE INQWIRE 1.5J.035X260 (WIRE) ×1 IMPLANT
INQWIRE 1.5J .035X260CM (WIRE) ×2
KIT HEART LEFT (KITS) ×2 IMPLANT
PACK CARDIAC CATHETERIZATION (CUSTOM PROCEDURE TRAY) ×2 IMPLANT
SYR MEDRAD MARK V 150ML (SYRINGE) ×2 IMPLANT
TRANSDUCER W/STOPCOCK (MISCELLANEOUS) ×2 IMPLANT
TUBING CIL FLEX 10 FLL-RA (TUBING) ×2 IMPLANT

## 2017-10-25 NOTE — Interval H&P Note (Signed)
History and Physical Interval Note:  10/25/2017 10:58 AM  Dean [name]  has presented today for surgery, with the diagnosis of NSTEMI  The various methods of treatment have been discussed with the patient and family. After consideration of risks, benefits and other options for treatment, the patient has consented to  Procedure(s): LEFT HEART CATH AND CORONARY ANGIOGRAPHY (N/A) with possible PERCUTANEOUS CORONARY INTERVENTION as a surgical intervention .  The patient's history has been reviewed, patient examined, no change in status, stable for surgery.  I have reviewed the patient's chart and labs.  Questions were answered to the patient's satisfaction.     Cath Lab Visit (complete for each Cath Lab visit)  Clinical Evaluation Leading to the Procedure:   ACS: Yes.    Non-ACS:    Anginal Classification: CCS IV  Anti-ischemic medical therapy: Minimal Therapy (1 class of medications)  Non-Invasive Test Results: No non-invasive testing performed  Prior CABG: No previous CABG   Glenetta Hew

## 2017-10-25 NOTE — Progress Notes (Signed)
ANTICOAGULATION CONSULT NOTE - Follow Up Consult  Pharmacy Consult for heparin Indication: Afib and NSTEMI  Labs: Recent Labs    10/22/17 0838 10/24/17 1029 10/24/17 1611 10/24/17 2021 10/25/17 0341  HGB 11.1* 11.6*  --   --  10.3*  HCT 34.6* 36.7*  --   --  32.8*  PLT 283 317  --   --  224  APTT  --   --   --   --  77*  LABPROT  --   --   --   --  14.7  INR  --   --   --   --  1.16  HEPARINUNFRC  --   --   --  0.24* 0.31  CREATININE 1.32* 1.15  --   --  1.16  TROPONINI  --   --  5.46* 12.27* 9.05*    Assessment: 71yo male now therapeutic on heparin after rate change though at very low end of goal.  Goal of Therapy:  Heparin level 0.3-0.7 units/ml   Plan:  Will increase heparin gtt slightly to 1200 units/hr to prevent dropping below goal and check level in 6hr.  Wynona Neat, PharmD, BCPS  10/25/2017,5:18 AM

## 2017-10-25 NOTE — Progress Notes (Signed)
Pre-op Cardiac Surgery  Carotid Findings:  Bilateral 1-39% ICA stenosis, antegrade vertebral flow.   [place], RVT 5:18 PM  10/25/2017

## 2017-10-25 NOTE — Care Management Note (Signed)
Case Management Note  Patient Details  Name: Dean [name] MRN: [id number] Date of Birth: 30-Jan-1946  Subjective/Objective:   POD 3 CABG, Hypertensive, hypokalemia, dc'd chest tubes, decreasing lasix, adjusting bp meds.                 Action/Plan: NCM will follow for dc needs.   Expected Discharge Date:                  Expected Discharge Plan:     In-House Referral:     Discharge planning Services  CM Consult  Post Acute Care Choice:    Choice offered to:     DME Arranged:    DME Agency:     HH Arranged:    HH Agency:     Status of Service:  In process, will continue to follow  If discussed at Long Length of Stay Meetings, dates discussed:    Additional Comments:  Zenon Mayo, RN 10/25/2017, 12:52 PM

## 2017-10-25 NOTE — Progress Notes (Signed)
Pre-op Cardiac Surgery  Carotid Findings:    Upper Extremity Right Left  Brachial Pressures NA 105  Radial Waveforms Triphasic   Ulnar Waveforms Triphasic   Palmar Arch (Allen's Test) Not obtained due to s/p radial cath  WNL   Findings:      Lower  Extremity Right Left  Dorsalis Pedis 134 140  Anterior Tibial    Posterior Tibial 128 126  Ankle/Brachial Indices 1.2 1.3    Findings:  Normal ABI at rest bilaterally.

## 2017-10-25 NOTE — Progress Notes (Signed)
  Echocardiogram 2D Echocardiogram has been performed.  Dean [name] G [name] 10/25/2017, 4:09 PM

## 2017-10-25 NOTE — Progress Notes (Signed)
Progress Note  Patient Name: Dean [name] Date of Encounter: 10/25/2017  Primary Cardiologist: Dr. Acie Fredrickson  Subjective   No chest discomfort, dyspnea or orthopnea. C/O mild nausea this am related to antibiotics, relieved with antiemetic.   Inpatient Medications    Scheduled Meds: . aspirin EC  81 mg Oral Daily  . clopidogrel  75 mg Oral Daily  . famotidine  20 mg Oral BID  . febuxostat  40 mg Oral Daily  . metoprolol tartrate  25 mg Oral BID  . rosuvastatin  20 mg Oral q1800  . sodium chloride flush  3 mL Intravenous Q12H   Continuous Infusions: . sodium chloride    . sodium chloride 75 mL/hr at 10/24/17 2309  . heparin 1,200 Units/hr (10/25/17 0948)   PRN Meds: sodium chloride, hydrocortisone, Influenza vac split quadrivalent PF, ondansetron (ZOFRAN) IV, sodium chloride flush   Vital Signs    Vitals:   10/24/17 2015 10/24/17 2042 10/25/17 0500 10/25/17 0939  BP: (!) 121/94 119/83 113/79 114/75  Pulse: 68 69 61   Resp: 16 18    Temp:  (!) 97.3 F (36.3 C) (!) 97.5 F (36.4 C)   TempSrc:  Oral Oral   SpO2: 99% 100% 100%   Weight:  160 lb 14.4 oz (73 kg) 161 lb 1.6 oz (73.1 kg)   Height:  5' 9.5" (1.765 m)      Intake/Output Summary (Last 24 hours) at 10/25/2017 1008 Last data filed at 10/25/2017 0300 Gross per 24 hour  Intake 1399.25 ml  Output 150 ml  Net 1249.25 ml   Filed Weights   10/24/17 2042 10/25/17 0500  Weight: 160 lb 14.4 oz (73 kg) 161 lb 1.6 oz (73.1 kg)    Telemetry    Sinus rhythm in the 60's with frequent PVCs - Personally Reviewed  ECG    Sinus rhythm with PVCs and PACs -Non-specific intra-ventricular conduction delay, TWI in V5-6, flattened Twaves inferiorly - Personally Reviewed  Physical Exam   GEN: No acute distress.   Neck: No JVD Cardiac: RRR, no murmurs, rubs, or gallops.  Respiratory: Clear to auscultation bilaterally. GI: Soft, nontender, non-distended  MS: No edema; No deformity. Neuro:  Nonfocal  Psych: Normal  affect   Labs    Chemistry Recent Labs  Lab 10/22/17 0838 10/24/17 1029 10/25/17 0341  NA 134* 137 138  K 3.1* 3.0* 4.7  CL 104 106 110  CO2 22 23 19*  GLUCOSE 109* 135* 93  BUN 12 11 10   CREATININE 1.32* 1.15 1.16  CALCIUM 8.7* 9.0 8.7*  PROT 7.1  --   --   ALBUMIN 3.1*  --   --   AST 19  --   --   ALT 7*  --   --   ALKPHOS 58  --   --   BILITOT 0.6  --   --   GFRNONAA 53* >60 >60  GFRAA >60 >60 >60  ANIONGAP 8 8 9      Hematology Recent Labs  Lab 10/22/17 0838 10/24/17 1029 10/25/17 0341  WBC 6.9 6.8 6.5  RBC 4.06* 4.25 3.78*  HGB 11.1* 11.6* 10.3*  HCT 34.6* 36.7* 32.8*  MCV 85.2 86.4 86.8  MCH 27.3 27.3 27.2  MCHC 32.1 31.6 31.4  RDW 16.4* 16.7* 17.0*  PLT 283 317 224    Cardiac Enzymes Recent Labs  Lab 10/24/17 1611 10/24/17 2021 10/25/17 0341  TROPONINI 5.46* 12.27* 9.05*    Recent Labs  Lab 10/24/17 1040 10/24/17 1323  TROPIPOC  0.29* 5.09*     BNPNo results for input(s): BNP, PROBNP in the last 168 hours.   DDimer No results for input(s): DDIMER in the last 168 hours.   Radiology    Dg Chest 2 View  Result Date: 10/24/2017 CLINICAL DATA:  Left chest pain beginning this morning. EXAM: CHEST  2 VIEW COMPARISON:  None. FINDINGS: Lungs are clear. Heart size is normal. No pneumothorax or pleural effusion. No focal bony abnormality. Contrast in the colon from CT abdomen and pelvis 10/21/2017 noted. IMPRESSION: No acute disease. Electronically Signed   By: Inge Rise M.D.   On: 10/24/2017 10:49    Cardiac Studies    Echo 07/27/2017: - Left ventricle: The cavity size was normal. There was mild focal basal hypertrophy of the septum. Systolic function was normal. The estimated ejection fraction was in the range of 55% to 60%. Wall motion was normal; there were no regional wall motion abnormalities. Doppler parameters are consistent with abnormal left ventricular relaxation (grade 1 diastolic dysfunction). There was no  evidence of elevated ventricular filling pressure by Doppler parameters. - Aortic valve: There was no regurgitation. - Mitral valve: There was trivial regurgitation. - Left atrium: The atrium was normal in size. - Right ventricle: Systolic function was normal. - Right atrium: The atrium was normal in size. - Tricuspid valve: There was no significant regurgitation. - Pulmonic valve: There was no regurgitation. - Pulmonary arteries: Systolic pressure could not be accurately estimated. - Inferior vena cava: The vessel was normal in size. - Pericardium, extracardiac: There was no pericardial effusion   Patient Profile     71 y.o. male with PMH of gout, GERD, diverticulitis (recent admission with 2 small perforations) presenting to [facility] with chest pain.   Assessment & Plan    NSTEMI:  -Pt with several weeks of intermittent mild chest pain, became more intense and unrelenting on Monday morning so presented to the ED.  -Troponin peaked at 12.27 and trending down -Started on Heparin, plavix, aspirin, BB, statin -No further chest pain overnight.  -For LHC today. SCr normal -LDL 68, statin initiated for CAD.  -Hgb A1C 5.8  Paroxysmal A fib -Has been mentioned in the past. Seen on EKGs and tele. No further afib through the night.  -BB started.  -CHA2DS2/VAS Stroke Risk Score is 2 (HTN, CAD). Currently on heparin. Will need oral anticoagulant at discharge for stroke risk reduction  Hypokalemia -Resolved. K+ 4.7 this am  Diverticulitis -Afebrile, no leukocytosis. Followed BY GI, on antibiotics for  Contained perforation and abscesses with urinary bladder involvement. The GI note mentioned surgical plan.    For questions or updates, please contact [place] Please consult [email or web address] for contact info under Cardiology/STEMI.      Signed, Daune Perch, NP  10/25/2017, 10:08 AM    Pt seen and examined   Agree with Maryla Morrow  Notes abo   Pt currently symptom free      Lungs CTA  Cardiac RRR  No S3  Ext without edema  Plan for L heart cath today .   Dorris Carnes

## 2017-10-25 NOTE — Anesthesia Preprocedure Evaluation (Addendum)
Anesthesia Evaluation  Patient identified by MRN, date of birth, ID band Patient awake    Reviewed: Allergy & Precautions, NPO status , Patient's Chart, lab work & pertinent test results, reviewed documented beta blocker date and time   Airway Mallampati: II  TM Distance: >3 FB Neck ROM: Full    Dental  (+) Dental Advisory Given   Pulmonary former smoker,    Pulmonary exam normal breath sounds clear to auscultation       Cardiovascular + CAD and + Past MI  Normal cardiovascular exam+ dysrhythmias Atrial Fibrillation  Rhythm:Regular Rate:Normal  TTE 2018 - Left ventricle: The cavity size was mildly dilated. Ejection fraction was in the range of 30% to 35%.  Regional wall motion abnormalities: Severe hypokinesis of the anterolateral, inferolateral, and inferior myocardium; consistent with ischemia in the distribution of the right coronary and left circumflex coronary artery. Grade 2 diastolic dysfunction. AV: Trileaflet; No AS, mild AI. Aorta:  Aortic root: The aortic root was normal in size. MV: Structurally normal valve.  No MS. There was mild to moderate regurgitation directed centrally. The acceleration rate of the regurgitant jet was reduced, consistent with a low dP/dt. LA was mildly to moderately dilated. Right ventricle:  The cavity size, thickness, and function normal. Trivial PR.No TR. Right atrium:  The atrium was normal in size.  EKG - SR with PVC's and PAC's  Cath 2018 - Dist LM to Ost LAD lesion is 80% stenosed with 80% stenosed side branch in Ost Cx. Prox Cx lesion is 99% stenosed -after the ostial lesion. There is TIMI I flow distal. Ost LAD to Prox LAD lesion is 60% stenosed. Prox LAD lesion is 80% stenosed. Prox LAD to Mid LAD lesion is 70% stenosed. Prox RCA to Mid RCA lesion is 100% stenosed. The 2 major downstream branches of the RCA (RPL & RPDA) fill via collaterals with brisk filling. There is severe left  ventricular systolic dysfunction. The left ventricular ejection fraction is less than 25% by visual estimate. LV end diastolic pressure is moderately elevated.   Neuro/Psych negative neurological ROS  negative psych ROS   GI/Hepatic GERD  ,(+) Hepatitis -, ADiverticulitis   Endo/Other  negative endocrine ROS  Renal/GU Renal InsufficiencyRenal disease  negative genitourinary   Musculoskeletal Gout   Abdominal   Peds  Hematology negative hematology ROS (+) anemia ,   Anesthesia Other Findings   Reproductive/Obstetrics                           Anesthesia Physical Anesthesia Plan  ASA: IV  Anesthesia Plan: General   Post-op Pain Management:    Induction: Intravenous  PONV Risk Score and Plan: Treatment may vary due to age or medical condition  Airway Management Planned: Oral ETT  Additional Equipment: Arterial line, CVP, PA Cath, TEE and Ultrasound Guidance Line Placement  Intra-op Plan:   Post-operative Plan: Post-operative intubation/ventilation  Informed Consent: I have reviewed the patients History and Physical, chart, labs and discussed the procedure including the risks, benefits and alternatives for the proposed anesthesia with the patient or authorized representative who has indicated his/her understanding and acceptance.   Dental advisory given  Plan Discussed with: CRNA  Anesthesia Plan Comments:         Anesthesia Quick Evaluation

## 2017-10-25 NOTE — Progress Notes (Signed)
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   Plavix received this am 11/20. CVTS consult pending.  Goal of Therapy:  Heparin level 0.3-0.7 units/ml   Plan:  Restart heparin tonight at previous rate. Check heparin level in am  Erin Hearing PharmD., [facility] Clinical Pharmacist Pager [phone] 10/25/2017 3:46 PM

## 2017-10-26 ENCOUNTER — Ambulatory Visit: Payer: Medicare Other | Admitting: Cardiovascular Disease

## 2017-10-26 ENCOUNTER — Encounter (HOSPITAL_COMMUNITY): Payer: Self-pay | Admitting: Cardiology

## 2017-10-26 ENCOUNTER — Inpatient Hospital Stay (HOSPITAL_COMMUNITY): Payer: Medicare Other

## 2017-10-26 ENCOUNTER — Inpatient Hospital Stay (HOSPITAL_COMMUNITY): Payer: Medicare Other | Admitting: Anesthesiology

## 2017-10-26 ENCOUNTER — Encounter (HOSPITAL_COMMUNITY)
Admission: EM | Disposition: A | Discharge status: Home / Self Care | Payer: Self-pay | Source: Home / Self Care | Attending: Surgery

## 2017-10-26 DIAGNOSIS — Z951 Presence of aortocoronary bypass graft: Secondary | ICD-10-CM

## 2017-10-26 DIAGNOSIS — I214 Non-ST elevation (NSTEMI) myocardial infarction: Secondary | ICD-10-CM

## 2017-10-26 DIAGNOSIS — I25110 Atherosclerotic heart disease of native coronary artery with unstable angina pectoris: Secondary | ICD-10-CM

## 2017-10-26 HISTORY — PX: TEE WITHOUT CARDIOVERSION: SHX5443

## 2017-10-26 HISTORY — PX: CORONARY ARTERY BYPASS GRAFT: SHX141

## 2017-10-26 LAB — RETICULOCYTES
RBC.: 3.58 MIL/uL — AB (ref 4.22–5.81)
RETIC COUNT ABSOLUTE: 57.3 10*3/uL (ref 19.0–186.0)
RETIC CT PCT: 1.6 % (ref 0.4–3.1)

## 2017-10-26 LAB — CBC
HCT: 31.4 % — ABNORMAL LOW (ref 39.0–52.0)
HCT: 26.5 % — ABNORMAL LOW (ref 39.0–52.0)
HEMATOCRIT: 28.5 % — AB (ref 39.0–52.0)
HEMOGLOBIN: 9.2 g/dL — AB (ref 13.0–17.0)
Hemoglobin: 9.8 g/dL — ABNORMAL LOW (ref 13.0–17.0)
Hemoglobin: 8.7 g/dL — ABNORMAL LOW (ref 13.0–17.0)
MCH: 27.4 pg (ref 26.0–34.0)
MCH: 27.4 pg (ref 26.0–34.0)
MCH: 27.8 pg (ref 26.0–34.0)
MCHC: 31.2 g/dL (ref 30.0–36.0)
MCHC: 32.3 g/dL (ref 30.0–36.0)
MCHC: 32.8 g/dL (ref 30.0–36.0)
MCV: 87.7 fL (ref 78.0–100.0)
MCV: 84.8 fL (ref 78.0–100.0)
MCV: 84.7 fL (ref 78.0–100.0)
PLATELETS: 135 10*3/uL — AB (ref 150–400)
Platelets: 206 10*3/uL (ref 150–400)
Platelets: 141 10*3/uL — ABNORMAL LOW (ref 150–400)
RBC: 3.58 MIL/uL — ABNORMAL LOW (ref 4.22–5.81)
RBC: 3.36 MIL/uL — ABNORMAL LOW (ref 4.22–5.81)
RBC: 3.13 MIL/uL — AB (ref 4.22–5.81)
RDW: 17.4 % — AB (ref 11.5–15.5)
RDW: 16.6 % — AB (ref 11.5–15.5)
RDW: 16.5 % — ABNORMAL HIGH (ref 11.5–15.5)
WBC: 4.2 10*3/uL (ref 4.0–10.5)
WBC: 5.2 10*3/uL (ref 4.0–10.5)
WBC: 4.9 10*3/uL (ref 4.0–10.5)

## 2017-10-26 LAB — POCT I-STAT, CHEM 8
BUN: 7 mg/dL (ref 6–20)
BUN: 5 mg/dL — AB (ref 6–20)
BUN: 6 mg/dL (ref 6–20)
BUN: 6 mg/dL (ref 6–20)
BUN: 6 mg/dL (ref 6–20)
CALCIUM ION: 1.17 mmol/L (ref 1.15–1.40)
CALCIUM ION: 1.1 mmol/L — AB (ref 1.15–1.40)
CHLORIDE: 105 mmol/L (ref 101–111)
CHLORIDE: 102 mmol/L (ref 101–111)
CREATININE: 0.9 mg/dL (ref 0.61–1.24)
CREATININE: 1 mg/dL (ref 0.61–1.24)
Calcium, Ion: 1.2 mmol/L (ref 1.15–1.40)
Calcium, Ion: 1.1 mmol/L — ABNORMAL LOW (ref 1.15–1.40)
Calcium, Ion: 1.09 mmol/L — ABNORMAL LOW (ref 1.15–1.40)
Chloride: 104 mmol/L (ref 101–111)
Chloride: 102 mmol/L (ref 101–111)
Chloride: 103 mmol/L (ref 101–111)
Creatinine, Ser: 1.1 mg/dL (ref 0.61–1.24)
Creatinine, Ser: 0.9 mg/dL (ref 0.61–1.24)
Creatinine, Ser: 0.9 mg/dL (ref 0.61–1.24)
GLUCOSE: 136 mg/dL — AB (ref 65–99)
GLUCOSE: 98 mg/dL (ref 65–99)
GLUCOSE: 157 mg/dL — AB (ref 65–99)
GLUCOSE: 153 mg/dL — AB (ref 65–99)
Glucose, Bld: 120 mg/dL — ABNORMAL HIGH (ref 65–99)
HCT: 27 % — ABNORMAL LOW (ref 39.0–52.0)
HCT: 22 % — ABNORMAL LOW (ref 39.0–52.0)
HCT: 25 % — ABNORMAL LOW (ref 39.0–52.0)
HEMATOCRIT: 22 % — AB (ref 39.0–52.0)
HEMATOCRIT: 25 % — AB (ref 39.0–52.0)
HEMOGLOBIN: 8.5 g/dL — AB (ref 13.0–17.0)
Hemoglobin: 9.2 g/dL — ABNORMAL LOW (ref 13.0–17.0)
Hemoglobin: 7.5 g/dL — ABNORMAL LOW (ref 13.0–17.0)
Hemoglobin: 7.5 g/dL — ABNORMAL LOW (ref 13.0–17.0)
Hemoglobin: 8.5 g/dL — ABNORMAL LOW (ref 13.0–17.0)
POTASSIUM: 4.1 mmol/L (ref 3.5–5.1)
POTASSIUM: 4.3 mmol/L (ref 3.5–5.1)
POTASSIUM: 4.8 mmol/L (ref 3.5–5.1)
Potassium: 3.7 mmol/L (ref 3.5–5.1)
Potassium: 4.7 mmol/L (ref 3.5–5.1)
SODIUM: 136 mmol/L (ref 135–145)
Sodium: 138 mmol/L (ref 135–145)
Sodium: 138 mmol/L (ref 135–145)
Sodium: 136 mmol/L (ref 135–145)
Sodium: 135 mmol/L (ref 135–145)
TCO2: 26 mmol/L (ref 22–32)
TCO2: 26 mmol/L (ref 22–32)
TCO2: 26 mmol/L (ref 22–32)
TCO2: 25 mmol/L (ref 22–32)
TCO2: 22 mmol/L (ref 22–32)

## 2017-10-26 LAB — FERRITIN: Ferritin: 151 ng/mL (ref 24–336)

## 2017-10-26 LAB — PREALBUMIN: PREALBUMIN: 11.6 mg/dL — AB (ref 18–38)

## 2017-10-26 LAB — POCT I-STAT 3, ART BLOOD GAS (G3+)
ACID-BASE EXCESS: 2 mmol/L (ref 0.0–2.0)
Acid-base deficit: 1 mmol/L (ref 0.0–2.0)
BICARBONATE: 23 mmol/L (ref 20.0–28.0)
BICARBONATE: 23 mmol/L (ref 20.0–28.0)
Bicarbonate: 23.3 mmol/L (ref 20.0–28.0)
Bicarbonate: 25.4 mmol/L (ref 20.0–28.0)
O2 SAT: 100 %
O2 SAT: 99 %
O2 Saturation: 100 %
O2 Saturation: 100 %
PCO2 ART: 28.8 mmHg — AB (ref 32.0–48.0)
PCO2 ART: 36.4 mmHg (ref 32.0–48.0)
PH ART: 7.414 (ref 7.350–7.450)
PH ART: 7.479 — AB (ref 7.350–7.450)
PO2 ART: 132 mmHg — AB (ref 83.0–108.0)
Patient temperature: 35.8
Patient temperature: 36.9
Patient temperature: 37.2
TCO2: 24 mmol/L (ref 22–32)
TCO2: 24 mmol/L (ref 22–32)
TCO2: 24 mmol/L (ref 22–32)
TCO2: 26 mmol/L (ref 22–32)
pCO2 arterial: 35.2 mmHg (ref 32.0–48.0)
pCO2 arterial: 30.9 mmHg — ABNORMAL LOW (ref 32.0–48.0)
pH, Arterial: 7.467 — ABNORMAL HIGH (ref 7.350–7.450)
pH, Arterial: 7.507 — ABNORMAL HIGH (ref 7.350–7.450)
pO2, Arterial: 332 mmHg — ABNORMAL HIGH (ref 83.0–108.0)
pO2, Arterial: 177 mmHg — ABNORMAL HIGH (ref 83.0–108.0)
pO2, Arterial: 180 mmHg — ABNORMAL HIGH (ref 83.0–108.0)

## 2017-10-26 LAB — GLUCOSE, CAPILLARY
GLUCOSE-CAPILLARY: 106 mg/dL — AB (ref 65–99)
GLUCOSE-CAPILLARY: 118 mg/dL — AB (ref 65–99)
Glucose-Capillary: 103 mg/dL — ABNORMAL HIGH (ref 65–99)
Glucose-Capillary: 96 mg/dL (ref 65–99)

## 2017-10-26 LAB — VITAMIN B12: VITAMIN B 12: 181 pg/mL (ref 180–914)

## 2017-10-26 LAB — IRON AND TIBC
Iron: 26 ug/dL — ABNORMAL LOW (ref 45–182)
Saturation Ratios: 14 % — ABNORMAL LOW (ref 17.9–39.5)
TIBC: 190 ug/dL — ABNORMAL LOW (ref 250–450)
UIBC: 164 ug/dL

## 2017-10-26 LAB — PROTIME-INR
INR: 1.46
Prothrombin Time: 17.6 seconds — ABNORMAL HIGH (ref 11.4–15.2)

## 2017-10-26 LAB — BASIC METABOLIC PANEL
ANION GAP: 8 (ref 5–15)
BUN: 7 mg/dL (ref 6–20)
CALCIUM: 8.5 mg/dL — AB (ref 8.9–10.3)
CO2: 22 mmol/L (ref 22–32)
CREATININE: 1.31 mg/dL — AB (ref 0.61–1.24)
Chloride: 106 mmol/L (ref 101–111)
GFR, EST NON AFRICAN AMERICAN: 54 mL/min — AB (ref >60)
Glucose, Bld: 91 mg/dL (ref 65–99)
Potassium: 4 mmol/L (ref 3.5–5.1)
SODIUM: 136 mmol/L (ref 135–145)

## 2017-10-26 LAB — HEMOGLOBIN AND HEMATOCRIT, BLOOD
HCT: 22.4 % — ABNORMAL LOW (ref 39.0–52.0)
Hemoglobin: 7.2 g/dL — ABNORMAL LOW (ref 13.0–17.0)

## 2017-10-26 LAB — CREATININE, SERUM: CREATININE: 1.08 mg/dL (ref 0.61–1.24)

## 2017-10-26 LAB — APTT: aPTT: 39 seconds — ABNORMAL HIGH (ref 24–36)

## 2017-10-26 LAB — POCT I-STAT 4, (NA,K, GLUC, HGB,HCT)
Glucose, Bld: 123 mg/dL — ABNORMAL HIGH (ref 65–99)
HEMATOCRIT: 25 % — AB (ref 39.0–52.0)
Hemoglobin: 8.5 g/dL — ABNORMAL LOW (ref 13.0–17.0)
Potassium: 3.8 mmol/L (ref 3.5–5.1)
Sodium: 140 mmol/L (ref 135–145)

## 2017-10-26 LAB — HEPARIN LEVEL (UNFRACTIONATED): Heparin Unfractionated: 0.25 IU/mL — ABNORMAL LOW (ref 0.30–0.70)

## 2017-10-26 LAB — MAGNESIUM: MAGNESIUM: 3.4 mg/dL — AB (ref 1.7–2.4)

## 2017-10-26 LAB — PLATELET COUNT: Platelets: 133 10*3/uL — ABNORMAL LOW (ref 150–400)

## 2017-10-26 LAB — PREPARE RBC (CROSSMATCH)

## 2017-10-26 SURGERY — CORONARY ARTERY BYPASS GRAFTING (CABG)
Anesthesia: General | Site: Chest

## 2017-10-26 MED ORDER — ASPIRIN 81 MG PO CHEW: 324.0000 mg | CHEWABLE_TABLET | Freq: Every day | ORAL | Status: DC

## 2017-10-26 MED ORDER — 0.9 % SODIUM CHLORIDE (POUR BTL) OPTIME
TOPICAL | Status: DC | PRN
  Administered 2017-10-26: 6000 mL

## 2017-10-26 MED ORDER — PROTAMINE SULFATE 10 MG/ML IV SOLN
INTRAVENOUS | Status: AC
  Filled 2017-10-26: qty 25

## 2017-10-26 MED ORDER — OXYCODONE HCL 5 MG PO TABS
5.0000 mg | ORAL_TABLET | ORAL | Status: DC | PRN
  Administered 2017-10-29: 5 mg via ORAL
  Filled 2017-10-26: qty 1

## 2017-10-26 MED ORDER — LACTATED RINGERS IV SOLN
INTRAVENOUS | Status: DC | PRN
  Administered 2017-10-26 (×2): via INTRAVENOUS

## 2017-10-26 MED ORDER — ALBUMIN HUMAN 5 % IV SOLN
INTRAVENOUS | Status: DC | PRN
  Administered 2017-10-26: 14:00:00 via INTRAVENOUS

## 2017-10-26 MED ORDER — SODIUM CHLORIDE 0.45 % IV SOLN: INTRAVENOUS | Status: DC | PRN

## 2017-10-26 MED ORDER — PROTAMINE SULFATE 10 MG/ML IV SOLN
INTRAVENOUS | Status: DC | PRN
  Administered 2017-10-26: 170 mg via INTRAVENOUS
  Administered 2017-10-26: 30 mg via INTRAVENOUS

## 2017-10-26 MED ORDER — ACETAMINOPHEN 160 MG/5ML PO SOLN: 650.0000 mg | Freq: Once | ORAL | Status: AC

## 2017-10-26 MED ORDER — HEMOSTATIC AGENTS (NO CHARGE) OPTIME
TOPICAL | Status: DC | PRN
  Administered 2017-10-26: 1 via TOPICAL

## 2017-10-26 MED ORDER — INSULIN REGULAR BOLUS VIA INFUSION
0.0000 [IU] | Freq: Three times a day (TID) | INTRAVENOUS | Status: DC
  Filled 2017-10-26: qty 10

## 2017-10-26 MED ORDER — LACTATED RINGERS IV SOLN: INTRAVENOUS | Status: DC

## 2017-10-26 MED ORDER — POTASSIUM CHLORIDE 10 MEQ/50ML IV SOLN
10.0000 meq | INTRAVENOUS | Status: AC
  Administered 2017-10-26 (×3): 10 meq via INTRAVENOUS

## 2017-10-26 MED ORDER — METOPROLOL TARTRATE 12.5 MG HALF TABLET: 12.5000 mg | ORAL_TABLET | Freq: Two times a day (BID) | ORAL | Status: DC

## 2017-10-26 MED ORDER — EPHEDRINE 5 MG/ML INJ
INTRAVENOUS | Status: AC
  Filled 2017-10-26: qty 10

## 2017-10-26 MED ORDER — INSULIN ASPART 100 UNIT/ML ~~LOC~~ SOLN: 0.0000 [IU] | SUBCUTANEOUS | Status: DC

## 2017-10-26 MED ORDER — BISACODYL 5 MG PO TBEC
10.0000 mg | DELAYED_RELEASE_TABLET | Freq: Every day | ORAL | Status: DC
  Administered 2017-10-27: 10 mg via ORAL
  Administered 2017-10-30: 5 mg via ORAL
  Filled 2017-10-26 (×5): qty 2

## 2017-10-26 MED ORDER — HEPARIN SODIUM (PORCINE) 1000 UNIT/ML IJ SOLN
INTRAMUSCULAR | Status: AC
  Filled 2017-10-26: qty 1

## 2017-10-26 MED ORDER — EPHEDRINE SULFATE-NACL 50-0.9 MG/10ML-% IV SOSY
PREFILLED_SYRINGE | INTRAVENOUS | Status: DC | PRN
  Administered 2017-10-26 (×2): 5 mg via INTRAVENOUS
  Administered 2017-10-26 (×2): 10 mg via INTRAVENOUS
  Administered 2017-10-26: 5 mg via INTRAVENOUS

## 2017-10-26 MED ORDER — ONDANSETRON HCL 4 MG/2ML IJ SOLN
4.0000 mg | Freq: Four times a day (QID) | INTRAMUSCULAR | Status: DC | PRN
  Administered 2017-10-28: 4 mg via INTRAVENOUS
  Filled 2017-10-26 (×2): qty 2

## 2017-10-26 MED ORDER — PHENYLEPHRINE 40 MCG/ML (10ML) SYRINGE FOR IV PUSH (FOR BLOOD PRESSURE SUPPORT)
PREFILLED_SYRINGE | INTRAVENOUS | Status: AC
  Filled 2017-10-26: qty 10

## 2017-10-26 MED ORDER — CHLORHEXIDINE GLUCONATE 0.12% ORAL RINSE (MEDLINE KIT)
15.0000 mL | Freq: Two times a day (BID) | OROMUCOSAL | Status: DC
  Administered 2017-10-26: 15 mL via OROMUCOSAL

## 2017-10-26 MED ORDER — SODIUM CHLORIDE 0.9 % IV SOLN: 250.0000 mL | INTRAVENOUS | Status: DC

## 2017-10-26 MED ORDER — MORPHINE SULFATE (PF) 4 MG/ML IV SOLN
1.0000 mg | INTRAVENOUS | Status: DC | PRN
  Administered 2017-10-27: 2 mg via INTRAVENOUS
  Filled 2017-10-26: qty 1

## 2017-10-26 MED ORDER — SODIUM CHLORIDE 0.9 % IV SOLN
0.0000 ug/kg/h | INTRAVENOUS | Status: DC
  Filled 2017-10-26: qty 2

## 2017-10-26 MED ORDER — SODIUM CHLORIDE 0.9 % IV SOLN: INTRAVENOUS | Status: DC

## 2017-10-26 MED ORDER — DEXMEDETOMIDINE HCL IN NACL 200 MCG/50ML IV SOLN
INTRAVENOUS | Status: AC
  Filled 2017-10-26: qty 50

## 2017-10-26 MED ORDER — ALBUMIN HUMAN 5 % IV SOLN
250.0000 mL | INTRAVENOUS | Status: AC | PRN
  Administered 2017-10-26 – 2017-10-27 (×4): 250 mL via INTRAVENOUS
  Filled 2017-10-26 (×2): qty 250

## 2017-10-26 MED ORDER — ACETAMINOPHEN 160 MG/5ML PO SOLN: 1000.0000 mg | Freq: Four times a day (QID) | ORAL | Status: DC

## 2017-10-26 MED ORDER — ROCURONIUM BROMIDE 10 MG/ML (PF) SYRINGE
PREFILLED_SYRINGE | INTRAVENOUS | Status: AC
  Filled 2017-10-26: qty 10

## 2017-10-26 MED ORDER — SODIUM CHLORIDE 0.9 % IV SOLN
INTRAVENOUS | Status: DC
  Filled 2017-10-26: qty 1

## 2017-10-26 MED ORDER — ORAL CARE MOUTH RINSE
15.0000 mL | Freq: Two times a day (BID) | OROMUCOSAL | Status: DC
  Administered 2017-10-27: 15 mL via OROMUCOSAL

## 2017-10-26 MED ORDER — BISACODYL 10 MG RE SUPP: 10.0000 mg | Freq: Every day | RECTAL | Status: DC

## 2017-10-26 MED ORDER — LACTATED RINGERS IV SOLN
INTRAVENOUS | Status: DC | PRN
  Administered 2017-10-26: 08:00:00 via INTRAVENOUS

## 2017-10-26 MED ORDER — PROPOFOL 10 MG/ML IV BOLUS
INTRAVENOUS | Status: DC | PRN
  Administered 2017-10-26 (×2): 100 mg via INTRAVENOUS

## 2017-10-26 MED ORDER — SODIUM CHLORIDE 0.9% FLUSH
10.0000 mL | Freq: Two times a day (BID) | INTRAVENOUS | Status: DC
  Administered 2017-10-26 – 2017-10-29 (×5): 10 mL

## 2017-10-26 MED ORDER — FENTANYL CITRATE (PF) 250 MCG/5ML IJ SOLN
INTRAMUSCULAR | Status: DC | PRN
  Administered 2017-10-26: 50 ug via INTRAVENOUS
  Administered 2017-10-26: 100 ug via INTRAVENOUS
  Administered 2017-10-26: 500 ug via INTRAVENOUS
  Administered 2017-10-26: 50 ug via INTRAVENOUS
  Administered 2017-10-26: 150 ug via INTRAVENOUS
  Administered 2017-10-26: 250 ug via INTRAVENOUS
  Administered 2017-10-26 (×3): 50 ug via INTRAVENOUS

## 2017-10-26 MED ORDER — ONDANSETRON HCL 4 MG/2ML IJ SOLN
INTRAMUSCULAR | Status: AC
  Filled 2017-10-26: qty 2

## 2017-10-26 MED ORDER — ASPIRIN EC 325 MG PO TBEC: 325.0000 mg | DELAYED_RELEASE_TABLET | Freq: Every day | ORAL | Status: DC

## 2017-10-26 MED ORDER — SODIUM CHLORIDE 0.9 % IJ SOLN
INTRAMUSCULAR | Status: AC
  Filled 2017-10-26: qty 10

## 2017-10-26 MED ORDER — MORPHINE SULFATE (PF) 2 MG/ML IV SOLN: 2.0000 mg | INTRAVENOUS | Status: DC | PRN

## 2017-10-26 MED ORDER — HEPARIN SODIUM (PORCINE) 1000 UNIT/ML IJ SOLN
INTRAMUSCULAR | Status: DC | PRN
  Administered 2017-10-26: 22000 [IU] via INTRAVENOUS

## 2017-10-26 MED ORDER — METOPROLOL TARTRATE 25 MG/10 ML ORAL SUSPENSION: 12.5000 mg | Freq: Two times a day (BID) | ORAL | Status: DC

## 2017-10-26 MED ORDER — DOPAMINE-DEXTROSE 3.2-5 MG/ML-% IV SOLN: 0.0000 ug/kg/min | INTRAVENOUS | Status: DC

## 2017-10-26 MED ORDER — VANCOMYCIN HCL IN DEXTROSE 1-5 GM/200ML-% IV SOLN
1000.0000 mg | Freq: Once | INTRAVENOUS | Status: AC
  Administered 2017-10-26: 1000 mg via INTRAVENOUS
  Filled 2017-10-26: qty 200

## 2017-10-26 MED ORDER — DOCUSATE SODIUM 100 MG PO CAPS
200.0000 mg | ORAL_CAPSULE | Freq: Every day | ORAL | Status: DC
  Administered 2017-10-27 – 2017-10-30 (×3): 200 mg via ORAL
  Administered 2017-10-31: 100 mg via ORAL
  Administered 2017-11-01: 200 mg via ORAL
  Filled 2017-10-26 (×5): qty 2

## 2017-10-26 MED ORDER — FAMOTIDINE IN NACL 20-0.9 MG/50ML-% IV SOLN
20.0000 mg | Freq: Two times a day (BID) | INTRAVENOUS | Status: AC
  Administered 2017-10-26: 20 mg via INTRAVENOUS

## 2017-10-26 MED ORDER — METOPROLOL TARTRATE 5 MG/5ML IV SOLN: 2.5000 mg | INTRAVENOUS | Status: DC | PRN

## 2017-10-26 MED ORDER — ARTIFICIAL TEARS OPHTHALMIC OINT
TOPICAL_OINTMENT | OPHTHALMIC | Status: AC
  Filled 2017-10-26: qty 3.5

## 2017-10-26 MED ORDER — MIDAZOLAM HCL 2 MG/2ML IJ SOLN: 2.0000 mg | INTRAMUSCULAR | Status: DC | PRN

## 2017-10-26 MED ORDER — PHENYLEPHRINE HCL 10 MG/ML IJ SOLN
0.0000 ug/min | INTRAMUSCULAR | Status: DC
  Administered 2017-10-27: 30 ug/min via INTRAVENOUS
  Administered 2017-10-28: 25 ug/min via INTRAVENOUS
  Administered 2017-10-28: 30 ug/min via INTRAVENOUS
  Filled 2017-10-26: qty 2
  Filled 2017-10-26: qty 20
  Filled 2017-10-26 (×2): qty 2

## 2017-10-26 MED ORDER — CHLORHEXIDINE GLUCONATE CLOTH 2 % EX PADS
6.0000 | MEDICATED_PAD | Freq: Every day | CUTANEOUS | Status: DC
  Administered 2017-10-26 – 2017-10-28 (×3): 6 via TOPICAL

## 2017-10-26 MED ORDER — NITROGLYCERIN IN D5W 200-5 MCG/ML-% IV SOLN: 0.0000 ug/min | INTRAVENOUS | Status: DC

## 2017-10-26 MED ORDER — SODIUM CHLORIDE 0.9 % IJ SOLN
OROMUCOSAL | Status: DC | PRN
  Administered 2017-10-26 (×3): 4 mL via TOPICAL

## 2017-10-26 MED ORDER — MIDAZOLAM HCL 5 MG/5ML IJ SOLN
INTRAMUSCULAR | Status: DC | PRN
  Administered 2017-10-26: 2 mg via INTRAVENOUS
  Administered 2017-10-26: 4 mg via INTRAVENOUS
  Administered 2017-10-26: 1 mg via INTRAVENOUS

## 2017-10-26 MED ORDER — ORAL CARE MOUTH RINSE: 15.0000 mL | Freq: Four times a day (QID) | OROMUCOSAL | Status: DC

## 2017-10-26 MED ORDER — ARTIFICIAL TEARS OPHTHALMIC OINT
TOPICAL_OINTMENT | OPHTHALMIC | Status: DC | PRN
  Administered 2017-10-26: 1 via OPHTHALMIC

## 2017-10-26 MED ORDER — TRAMADOL HCL 50 MG PO TABS
50.0000 mg | ORAL_TABLET | ORAL | Status: DC | PRN
  Administered 2017-10-27: 50 mg via ORAL
  Filled 2017-10-26: qty 1

## 2017-10-26 MED ORDER — THROMBIN (RECOMBINANT) 20000 UNITS EX SOLR
CUTANEOUS | Status: DC | PRN
  Administered 2017-10-26 (×3): 5000 [IU] via TOPICAL

## 2017-10-26 MED ORDER — THROMBIN (RECOMBINANT) 5000 UNITS EX SOLR
CUTANEOUS | Status: AC
  Filled 2017-10-26: qty 15000

## 2017-10-26 MED ORDER — SODIUM CHLORIDE 0.9% FLUSH
3.0000 mL | Freq: Two times a day (BID) | INTRAVENOUS | Status: DC
  Administered 2017-10-27 – 2017-11-01 (×9): 3 mL via INTRAVENOUS

## 2017-10-26 MED ORDER — PROPOFOL 10 MG/ML IV BOLUS
INTRAVENOUS | Status: AC
  Filled 2017-10-26: qty 20

## 2017-10-26 MED ORDER — CHLORHEXIDINE GLUCONATE 0.12 % MT SOLN
15.0000 mL | OROMUCOSAL | Status: AC
  Administered 2017-10-26: 15 mL via OROMUCOSAL

## 2017-10-26 MED ORDER — SODIUM CHLORIDE 0.9% FLUSH: 3.0000 mL | INTRAVENOUS | Status: DC | PRN

## 2017-10-26 MED ORDER — PANTOPRAZOLE SODIUM 40 MG PO TBEC
40.0000 mg | DELAYED_RELEASE_TABLET | Freq: Every day | ORAL | Status: DC
  Administered 2017-10-28 – 2017-11-03 (×7): 40 mg via ORAL
  Filled 2017-10-26 (×7): qty 1

## 2017-10-26 MED ORDER — DEXTROSE 5 % IV SOLN
1.5000 g | Freq: Two times a day (BID) | INTRAVENOUS | Status: DC
  Filled 2017-10-26: qty 1.5

## 2017-10-26 MED ORDER — ACETAMINOPHEN 650 MG RE SUPP
650.0000 mg | Freq: Once | RECTAL | Status: AC
  Administered 2017-10-26: 650 mg via RECTAL

## 2017-10-26 MED ORDER — MORPHINE SULFATE (PF) 2 MG/ML IV SOLN: 1.0000 mg | INTRAVENOUS | Status: AC | PRN

## 2017-10-26 MED ORDER — FENTANYL CITRATE (PF) 250 MCG/5ML IJ SOLN
INTRAMUSCULAR | Status: AC
  Filled 2017-10-26: qty 25

## 2017-10-26 MED ORDER — ROCURONIUM BROMIDE 10 MG/ML (PF) SYRINGE
PREFILLED_SYRINGE | INTRAVENOUS | Status: DC | PRN
  Administered 2017-10-26 (×2): 50 mg via INTRAVENOUS
  Administered 2017-10-26: 100 mg via INTRAVENOUS

## 2017-10-26 MED ORDER — LACTATED RINGERS IV SOLN
INTRAVENOUS | Status: DC | PRN
  Administered 2017-10-26: 09:00:00 via INTRAVENOUS

## 2017-10-26 MED ORDER — LACTATED RINGERS IV SOLN: 500.0000 mL | Freq: Once | INTRAVENOUS | Status: DC | PRN

## 2017-10-26 MED ORDER — MORPHINE SULFATE (PF) 4 MG/ML IV SOLN
2.0000 mg | INTRAVENOUS | Status: DC | PRN
  Administered 2017-10-27: 2 mg via INTRAVENOUS
  Filled 2017-10-26: qty 1

## 2017-10-26 MED ORDER — MAGNESIUM SULFATE 4 GM/100ML IV SOLN
4.0000 g | Freq: Once | INTRAVENOUS | Status: AC
  Administered 2017-10-26: 4 g via INTRAVENOUS
  Filled 2017-10-26: qty 100

## 2017-10-26 MED ORDER — SODIUM CHLORIDE 0.9% FLUSH: 10.0000 mL | INTRAVENOUS | Status: DC | PRN

## 2017-10-26 MED ORDER — ACETAMINOPHEN 500 MG PO TABS
1000.0000 mg | ORAL_TABLET | Freq: Four times a day (QID) | ORAL | Status: AC
  Administered 2017-10-26 – 2017-10-31 (×11): 1000 mg via ORAL
  Filled 2017-10-26 (×12): qty 2

## 2017-10-26 MED ORDER — MIDAZOLAM HCL 10 MG/2ML IJ SOLN
INTRAMUSCULAR | Status: AC
  Filled 2017-10-26: qty 2

## 2017-10-26 SURGICAL SUPPLY — 120 items
ADAPTER CARDIO PERF ANTE/RETRO (ADAPTER) ×4 IMPLANT
BAG DECANTER FOR FLEXI CONT (MISCELLANEOUS) ×4 IMPLANT
BANDAGE ACE 4X5 VEL STRL LF (GAUZE/BANDAGES/DRESSINGS) ×8 IMPLANT
BANDAGE ACE 6X5 VEL STRL LF (GAUZE/BANDAGES/DRESSINGS) ×8 IMPLANT
BASKET HEART  (ORDER IN 25'S) (MISCELLANEOUS) ×1
BASKET HEART (ORDER IN 25'S) (MISCELLANEOUS) ×1
BASKET HEART (ORDER IN 25S) (MISCELLANEOUS) ×2 IMPLANT
BLADE STERNUM SYSTEM 6 (BLADE) ×4 IMPLANT
BNDG GAUZE ELAST 4 BULKY (GAUZE/BANDAGES/DRESSINGS) ×8 IMPLANT
CANISTER SUCT 3000ML PPV (MISCELLANEOUS) ×4 IMPLANT
CANNULA GUNDRY RCSP 15FR (MISCELLANEOUS) ×4 IMPLANT
CANNULA SUMP PERICARDIAL (CANNULA) ×4 IMPLANT
CATH ROBINSON RED A/P 18FR (CATHETERS) ×8 IMPLANT
CATH THORACIC 28FR (CATHETERS) ×4 IMPLANT
CATH THORACIC 36FR (CATHETERS) ×4 IMPLANT
CATH THORACIC 36FR RT ANG (CATHETERS) ×4 IMPLANT
CLIP VESOCCLUDE MED 24/CT (CLIP) IMPLANT
CLIP VESOCCLUDE SM WIDE 24/CT (CLIP) ×4 IMPLANT
CONN 1/2X1/2X1/2  BEN (MISCELLANEOUS) ×2
CONN 1/2X1/2X1/2 BEN (MISCELLANEOUS) ×2 IMPLANT
CONN 3/8X1/2 ST GISH (MISCELLANEOUS) ×8 IMPLANT
COVER MAYO STAND STRL (DRAPES) ×4 IMPLANT
CRADLE DONUT ADULT HEAD (MISCELLANEOUS) ×4 IMPLANT
DERMABOND ADVANCED (GAUZE/BANDAGES/DRESSINGS) ×4
DERMABOND ADVANCED .7 DNX12 (GAUZE/BANDAGES/DRESSINGS) ×4 IMPLANT
DRAPE CARDIOVASCULAR INCISE (DRAPES) ×2
DRAPE SLUSH/WARMER DISC (DRAPES) ×4 IMPLANT
DRAPE SRG 135X102X78XABS (DRAPES) ×2 IMPLANT
DRSG COVADERM 4X14 (GAUZE/BANDAGES/DRESSINGS) ×4 IMPLANT
ELECT CAUTERY BLADE 6.4 (BLADE) ×8 IMPLANT
ELECT REM PT RETURN 9FT ADLT (ELECTROSURGICAL) ×8
ELECTRODE REM PT RTRN 9FT ADLT (ELECTROSURGICAL) ×4 IMPLANT
FELT TEFLON 1X6 (MISCELLANEOUS) ×8 IMPLANT
GAUZE SPONGE 4X4 12PLY STRL (GAUZE/BANDAGES/DRESSINGS) ×12 IMPLANT
GLOVE BIO SURGEON STRL SZ 6 (GLOVE) IMPLANT
GLOVE BIO SURGEON STRL SZ 6.5 (GLOVE) ×24 IMPLANT
GLOVE BIO SURGEON STRL SZ7 (GLOVE) IMPLANT
GLOVE BIO SURGEON STRL SZ7.5 (GLOVE) ×8 IMPLANT
GLOVE BIO SURGEONS STRL SZ 6.5 (GLOVE) ×8
GLOVE BIOGEL PI IND STRL 6 (GLOVE) IMPLANT
GLOVE BIOGEL PI IND STRL 6.5 (GLOVE) IMPLANT
GLOVE BIOGEL PI IND STRL 7.0 (GLOVE) IMPLANT
GLOVE BIOGEL PI INDICATOR 6 (GLOVE)
GLOVE BIOGEL PI INDICATOR 6.5 (GLOVE)
GLOVE BIOGEL PI INDICATOR 7.0 (GLOVE)
GLOVE EUDERMIC 7 POWDERFREE (GLOVE) ×8 IMPLANT
GLOVE ORTHO TXT STRL SZ7.5 (GLOVE) IMPLANT
GOWN STRL REUS W/ TWL LRG LVL3 (GOWN DISPOSABLE) ×16 IMPLANT
GOWN STRL REUS W/ TWL XL LVL3 (GOWN DISPOSABLE) ×2 IMPLANT
GOWN STRL REUS W/TWL LRG LVL3 (GOWN DISPOSABLE) ×16
GOWN STRL REUS W/TWL XL LVL3 (GOWN DISPOSABLE) ×2
HEMOSTAT POWDER SURGIFOAM 1G (HEMOSTASIS) ×12 IMPLANT
HEMOSTAT SURGICEL 2X14 (HEMOSTASIS) ×4 IMPLANT
INSERT FOGARTY 61MM (MISCELLANEOUS) IMPLANT
INSERT FOGARTY XLG (MISCELLANEOUS) IMPLANT
KIT BASIN OR (CUSTOM PROCEDURE TRAY) ×4 IMPLANT
KIT CATH CPB BARTLE (MISCELLANEOUS) ×4 IMPLANT
KIT ROOM TURNOVER OR (KITS) ×4 IMPLANT
KIT SUCTION CATH 14FR (SUCTIONS) ×4 IMPLANT
KIT VASOVIEW HEMOPRO VH 3000 (KITS) ×4 IMPLANT
NS IRRIG 1000ML POUR BTL (IV SOLUTION) ×24 IMPLANT
PACK OPEN HEART (CUSTOM PROCEDURE TRAY) ×4 IMPLANT
PAD ARMBOARD 7.5X6 YLW CONV (MISCELLANEOUS) ×8 IMPLANT
PAD ELECT DEFIB RADIOL ZOLL (MISCELLANEOUS) ×4 IMPLANT
PENCIL BUTTON HOLSTER BLD 10FT (ELECTRODE) ×4 IMPLANT
PUNCH AORTIC ROTATE 4.0MM (MISCELLANEOUS) IMPLANT
PUNCH AORTIC ROTATE 4.5MM 8IN (MISCELLANEOUS) ×4 IMPLANT
PUNCH AORTIC ROTATE 5MM 8IN (MISCELLANEOUS) IMPLANT
SET CARDIOPLEGIA MPS 5001102 (MISCELLANEOUS) ×4 IMPLANT
SPONGE INTESTINAL PEANUT (DISPOSABLE) IMPLANT
SPONGE LAP 18X18 X RAY DECT (DISPOSABLE) IMPLANT
SPONGE LAP 4X18 X RAY DECT (DISPOSABLE) ×4 IMPLANT
SUT BONE WAX W31G (SUTURE) ×4 IMPLANT
SUT ETHIBOND 2 0 SH (SUTURE) ×10
SUT ETHIBOND 2 0 SH 36X2 (SUTURE) ×10 IMPLANT
SUT MNCRL AB 4-0 PS2 18 (SUTURE) IMPLANT
SUT PROLENE 3 0 SH DA (SUTURE) IMPLANT
SUT PROLENE 3 0 SH1 36 (SUTURE) ×4 IMPLANT
SUT PROLENE 4 0 RB 1 (SUTURE) ×6
SUT PROLENE 4 0 SH DA (SUTURE) IMPLANT
SUT PROLENE 4-0 RB1 .5 CRCL 36 (SUTURE) ×6 IMPLANT
SUT PROLENE 5 0 C 1 36 (SUTURE) ×8 IMPLANT
SUT PROLENE 6 0 C 1 30 (SUTURE) ×24 IMPLANT
SUT PROLENE 7 0 BV 1 (SUTURE) IMPLANT
SUT PROLENE 7 0 BV1 MDA (SUTURE) ×8 IMPLANT
SUT PROLENE 8 0 BV175 6 (SUTURE) ×8 IMPLANT
SUT SILK  1 MH (SUTURE) ×6
SUT SILK 1 MH (SUTURE) ×6 IMPLANT
SUT SILK 1 TIES 10X30 (SUTURE) ×4 IMPLANT
SUT SILK 2 0 SH CR/8 (SUTURE) ×4 IMPLANT
SUT SILK 2 0 TIES 10X30 (SUTURE) ×4 IMPLANT
SUT SILK 2 0 TIES 17X18 (SUTURE) ×2
SUT SILK 2-0 18XBRD TIE BLK (SUTURE) ×2 IMPLANT
SUT SILK 3 0 SH CR/8 (SUTURE) ×4 IMPLANT
SUT SILK 4 0 TIE 10X30 (SUTURE) ×8 IMPLANT
SUT STEEL 6MS V (SUTURE) ×8 IMPLANT
SUT STEEL STERNAL CCS#1 18IN (SUTURE) IMPLANT
SUT STEEL SZ 6 DBL 3X14 BALL (SUTURE) IMPLANT
SUT TEM PAC WIRE 2 0 SH (SUTURE) ×4 IMPLANT
SUT VIC AB 1 CTX 36 (SUTURE) ×4
SUT VIC AB 1 CTX36XBRD ANBCTR (SUTURE) ×4 IMPLANT
SUT VIC AB 2-0 CT1 27 (SUTURE) ×4
SUT VIC AB 2-0 CT1 TAPERPNT 27 (SUTURE) ×4 IMPLANT
SUT VIC AB 2-0 CTX 27 (SUTURE) ×8 IMPLANT
SUT VIC AB 3-0 SH 27 (SUTURE)
SUT VIC AB 3-0 SH 27X BRD (SUTURE) IMPLANT
SUT VIC AB 3-0 X1 27 (SUTURE) ×16 IMPLANT
SUT VICRYL 4-0 PS2 18IN ABS (SUTURE) IMPLANT
SUTURE E-PAK OPEN HEART (SUTURE) IMPLANT
SYSTEM SAHARA CHEST DRAIN ATS (WOUND CARE) ×4 IMPLANT
TAPE CLOTH SURG 4X10 WHT LF (GAUZE/BANDAGES/DRESSINGS) ×4 IMPLANT
TAPE PAPER 2X10 WHT MICROPORE (GAUZE/BANDAGES/DRESSINGS) ×4 IMPLANT
TOWEL GREEN STERILE (TOWEL DISPOSABLE) ×8 IMPLANT
TOWEL GREEN STERILE FF (TOWEL DISPOSABLE) ×4 IMPLANT
TOWEL OR 17X24 6PK STRL BLUE (TOWEL DISPOSABLE) ×4 IMPLANT
TOWEL OR 17X26 10 PK STRL BLUE (TOWEL DISPOSABLE) ×4 IMPLANT
TRAY FOLEY SILVER 16FR TEMP (SET/KITS/TRAYS/PACK) ×4 IMPLANT
TUBING INSUFFLATION (TUBING) ×4 IMPLANT
UNDERPAD 30X30 (UNDERPADS AND DIAPERS) ×4 IMPLANT
WATER STERILE IRR 1000ML POUR (IV SOLUTION) ×8 IMPLANT

## 2017-10-26 NOTE — Progress Notes (Signed)
RT note- Placed back to full support due to decreased VE and VT, continue to monitor.

## 2017-10-26 NOTE — Anesthesia Procedure Notes (Signed)
Procedure Name: Intubation Date/Time: 10/26/2017 8:50 AM Performed by: Teressa Lower., CRNA Pre-anesthesia Checklist: Patient identified, Emergency Drugs available, Suction available and Patient being monitored Patient Re-evaluated:Patient Re-evaluated prior to induction Oxygen Delivery Method: Circle system utilized Preoxygenation: Pre-oxygenation with 100% oxygen Induction Type: IV induction Ventilation: Mask ventilation without difficulty Laryngoscope Size: Mac and 3 Grade View: Grade II Tube type: Oral Tube size: 7.5 mm Number of attempts: 1 Airway Equipment and Method: Stylet and Oral airway Placement Confirmation: ETT inserted through vocal cords under direct vision,  positive ETCO2 and breath sounds checked- equal and bilateral Secured at: 23 cm Tube secured with: Tape Dental Injury: Teeth and Oropharynx as per pre-operative assessment

## 2017-10-26 NOTE — Transfer of Care (Signed)
Immediate Anesthesia Transfer of Care Note  Patient: Travone Georg  Procedure(s) Performed: CORONARY ARTERY BYPASS GRAFTING (CABG) x four , using left internal mammary artery and bilateral legs greater saphenous vein harvested endoscopically (N/A Chest) TRANSESOPHAGEAL ECHOCARDIOGRAM (TEE) (N/A )  Patient Location: SICU  Anesthesia Type:General  Level of Consciousness: Patient remains intubated per anesthesia plan  Airway & Oxygen Therapy: Patient remains intubated per anesthesia plan and Patient placed on Ventilator (see vital sign flow sheet for setting)  Post-op Assessment: Report given to RN and Post -op Vital signs reviewed and stable  Post vital signs: Reviewed and stable  Last Vitals:  Vitals:   10/26/17 0700 10/26/17 1518  BP: 121/86   Pulse: 75   Resp: 20   Temp:    SpO2: 100% 100%    Last Pain:  Vitals:   10/26/17 0400  TempSrc: Oral  PainSc: 0-No pain         Complications: No apparent anesthesia complications

## 2017-10-26 NOTE — Plan of Care (Signed)
Pt progressing

## 2017-10-26 NOTE — Progress Notes (Signed)
This note also relates to the following rows which could not be included:  SpO2 - Cannot attach notes to unvalidated device data

## 2017-10-26 NOTE — Progress Notes (Signed)
RT note- started rapid wean, patient tolerating well, continue to monitor.

## 2017-10-26 NOTE — OR Nursing (Signed)
14:05 - 45 minute call to SICU  14:35 - 20 minute call to SICU

## 2017-10-26 NOTE — Anesthesia Procedure Notes (Signed)
Arterial Line Insertion Start/End11/21/2018 7:50 AM, 10/26/2017 8:00 AM Performed by: Teressa Lower., CRNA  Patient location: Pre-op. Preanesthetic checklist: patient identified, IV checked, site marked, risks and benefits discussed, surgical consent, monitors and equipment checked, pre-op evaluation, timeout performed and anesthesia consent Lidocaine 1% used for infiltration Left, radial was placed Catheter size: 20 G Hand hygiene performed , maximum sterile barriers used  and Seldinger technique used Allen's test indicative of satisfactory collateral circulation Attempts: 1 Procedure performed without using ultrasound guided technique. Following insertion, dressing applied and Biopatch. Post procedure assessment: normal and unchanged  Patient tolerated the procedure well with no immediate complications.

## 2017-10-26 NOTE — Consult Note (Signed)
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 Glenetta Hew  Hameed Kolar is an 71 y.o. male.  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 This was found after a one to two year history of abdominal complaints, anorexia, weight loss. He has a CT of the abdomen on 10/07/2017 that showed the perforated diverticulitis. He was admitted for IV antibiotics but felt better quickly and requested discharge the next day and was sent home on oral antibiotics. He had a repeat CT on 10/21/2017 that still showed the contained perforation although he had no abdominal pain, fever or leukocytosis. He says that he was told to return to the hospital for IV antibiotics but when he returned last Saturday he was told that he did not need to be hospitalized and was continued on oral antibiotic as an outpt. He had an appt on Monday to have venous dopplers done to rule out DVT because he had developed swelling in his legs. On Monday morning he woke up with chest pain, shortness of breath, diaphoresis and came to the ER where his troponin was 0.3 and rose to 5. He was diagnosed with NSTEMI and started on heparin, given Plavix by cardiology. Troponin peaked at 12.2. Cath shows high grade left main and severe 3 vessel CAD with 90% proximal LAD, subtotal LCX, and occluded RCA filling by collaterals from the LAD. LVEF is 25% with LVEDP of 18. Echo in 07/2017 showed normal LVEF with G1DD. 2D echo now shows an LVEF of 30-35% with severe hypokinesis of the anterolateral, inferolateral and inferior walls. Mild to moderate MR, mild  AI.  He reports chest discomfort off and on for over a year which has been happening more often and recently at rest and waking him up at night. He has been seen by Dr. Acie Fredrickson in the past for chest pain and shortness of breath in 05/2016. His symptoms were intermittent and he had a negative stress myoview prior to that so it was decided to follow him at that time without further testing. He was most recently seen by him in August for evaluation of PVC's. He was scheduled for a cardiac CT but it could not be done due to the PVC's.   Past Medical History:  Diagnosis Date  . Acid reflux   . Arthropathy   . Atrial fibrillation ([place]) 10/2017  . Diverticulitis   . Dizziness   . Fatigue   . Gout   . Hepatitis A age 57  . Hyperlipemia   . Hypogonadism male   . Knee pain   . Night sweats   . Non compliance with medical treatment   . Non-STEMI (non-ST elevated myocardial infarction) ([place]) 10/2017  . Palpitations   . Seasonal allergies   . SOB (shortness of breath)   . Vitamin D deficiency     Past Surgical History:  Procedure Laterality Date  . COLONOSCOPY  ~ 2014   Dr Ferdinand Lango in [facility].  Diverticulosis.  pt denies colon  polyps.      Family History  Problem Relation Age of Onset  . Hyperlipidemia Mother   . Alzheimer's disease Mother   . Hyperlipidemia Brother   . Alzheimer's disease Maternal Grandmother   . Colon cancer Neg Hx   . Esophageal cancer Neg Hx   . Stomach cancer Neg Hx     Social History:  reports that he has quit smoking. he has never used smokeless tobacco. He reports that he drinks alcohol. He reports that he does not use drugs.  Allergies:  Allergies  Allergen Reactions  . Aspirin Other (See Comments)    Will flare up gout Will flare up gout    Medications:  I have reviewed the patient's current medications. Prior to Admission:  Medications Prior to Admission  Medication Sig Dispense Refill Last Dose  . acetaminophen (TYLENOL) 325 MG tablet Take 325  mg every 6 (six) hours as needed by mouth for mild pain.   unk at [employer]  . amoxicillin-clavulanate (AUGMENTIN) 875-125 MG tablet Take 1 tablet 2 (two) times daily for 14 days by mouth. 28 tablet 0 10/23/2017 at Unknown time  . Cholecalciferol (VITAMIN D3) 5000 units CAPS Take 5,000 Units daily by mouth.    10/23/2017 at Unknown time  . febuxostat (ULORIC) 40 MG tablet Take 40 mg by mouth daily.   10/23/2017 at Unknown time  . hydrocortisone cream 1 % Apply 1 application as needed topically for itching.   unk at [employer]  . indomethacin (INDOCIN) 50 MG capsule Take 50 mg daily as needed by mouth for mild pain (gout flare up).    10/23/2017 at Unknown time   Scheduled: . aspirin EC  81 mg Oral Daily  . bisacodyl  5 mg Oral Once  . diazepam  5 mg Oral Once  . famotidine  20 mg Oral BID  . febuxostat  40 mg Oral Daily  . heparin-papaverine-plasmalyte irrigation   Irrigation To OR  . magnesium sulfate  40 mEq Other To OR  . metoprolol tartrate  12.5 mg Oral Once  . metoprolol tartrate  25 mg Oral BID  . potassium chloride  80 mEq Other To OR  . rosuvastatin  40 mg Oral q1800  . sodium chloride flush  3 mL Intravenous Q12H  . tranexamic acid  15 mg/kg Intravenous To OR  . tranexamic acid  2 mg/kg Intracatheter To OR   Continuous: . sodium chloride    . cefUROXime (ZINACEF)  IV    . cefUROXime (ZINACEF)  IV    . dexmedetomidine    . DOPamine    . epinephrine    . heparin 30,000 units/NS 1000 mL solution for CELLSAVER    . heparin 1,200 Units/hr (10/25/17 2200)  . insulin (NOVOLIN-R) infusion    . nitroGLYCERIN    . phenylephrine 1m/250mL NS (0.055mml) infusion    . piperacillin-tazobactam (ZOSYN)  IV 3.375 g (10/26/17 0521)  . tranexamic acid (CYKLOKAPRON) infusion ([facility])    . vancomycin     PROZH:YQMVHQhloride, acetaminophen, ALPRAZolam, hydrocortisone, Influenza vac split quadrivalent PF, ondansetron (ZOFRAN) IV, sodium chloride flush, temazepam Anti-infectives (From admission, onward)     Start     Dose/Rate Route Frequency Ordered Stop   10/26/17 0400  vancomycin (VANCOCIN) 1,250 mg in sodium chloride 0.9 % 250 mL IVPB     1,250 mg 166.7 mL/hr over 90 Minutes Intravenous To Surgery 10/25/17 1843 10/27/17 0400   10/26/17 0400  cefUROXime (ZINACEF) 1.5 g in dextrose 5 % 50 mL IVPB  1.5 g 100 mL/hr over 30 Minutes Intravenous To Surgery 10/25/17 1843 10/27/17 0400   10/26/17 0400  cefUROXime (ZINACEF) 750 mg in dextrose 5 % 50 mL IVPB     750 mg 100 mL/hr over 30 Minutes Intravenous To Surgery 10/25/17 1843 10/27/17 0400   10/25/17 1400  piperacillin-tazobactam (ZOSYN) IVPB 3.375 g     3.375 g 12.5 mL/hr over 240 Minutes Intravenous Every 8 hours 10/25/17 1249     10/25/17 1100  amoxicillin-clavulanate (AUGMENTIN) 875-125 MG per tablet 1 tablet  Status:  Discontinued     1 tablet Oral Every 12 hours 10/25/17 1015 10/25/17 1249   10/25/17 1015  amoxicillin-clavulanate (AUGMENTIN) 875-125 MG per tablet 1 tablet  Status:  Discontinued     1 tablet Oral Every 12 hours 10/25/17 1014 10/25/17 1015   10/24/17 1415  piperacillin-tazobactam (ZOSYN) IVPB 3.375 g     3.375 g 100 mL/hr over 30 Minutes Intravenous  Once 10/24/17 1405 10/24/17 1511      Results for orders placed or performed during the hospital encounter of 10/24/17 (from the past 48 hour(s))  Basic metabolic panel     Status: Abnormal   Collection Time: 10/24/17 10:29 AM  Result Value Ref Range   Sodium 137 135 - 145 mmol/L   Potassium 3.0 (L) 3.5 - 5.1 mmol/L   Chloride 106 101 - 111 mmol/L   CO2 23 22 - 32 mmol/L   Glucose, Bld 135 (H) 65 - 99 mg/dL   BUN 11 6 - 20 mg/dL   Creatinine, Ser 1.15 0.61 - 1.24 mg/dL   Calcium 9.0 8.9 - 10.3 mg/dL   GFR calc non Af Amer >60 >60 mL/min   GFR calc Af Amer >60 >60 mL/min    Comment: (NOTE) The eGFR has been calculated using the CKD EPI equation. This calculation has not been validated in all clinical situations. eGFR's persistently <60 mL/min signify possible  Chronic Kidney Disease.    Anion gap 8 5 - 15  CBC     Status: Abnormal   Collection Time: 10/24/17 10:29 AM  Result Value Ref Range   WBC 6.8 4.0 - 10.5 K/uL   RBC 4.25 4.22 - 5.81 MIL/uL   Hemoglobin 11.6 (L) 13.0 - 17.0 g/dL   HCT 36.7 (L) 39.0 - 52.0 %   MCV 86.4 78.0 - 100.0 fL   MCH 27.3 26.0 - 34.0 pg   MCHC 31.6 30.0 - 36.0 g/dL   RDW 16.7 (H) 11.5 - 15.5 %   Platelets 317 150 - 400 K/uL  I-stat troponin, ED     Status: Abnormal   Collection Time: 10/24/17 10:40 AM  Result Value Ref Range   Troponin i, poc 0.29 (HH) 0.00 - 0.08 ng/mL   Comment NOTIFIED PHYSICIAN    Comment 3            Comment: Due to the release kinetics of cTnI, a negative result within the first hours of the onset of symptoms does not rule out myocardial infarction with certainty. If myocardial infarction is still suspected, repeat the test at appropriate intervals.   I-Stat CG4 Lactic Acid, ED     Status: Abnormal   Collection Time: 10/24/17 10:43 AM  Result Value Ref Range   Lactic Acid, Venous 2.07 (HH) 0.5 - 1.9 mmol/L   Comment NOTIFIED PHYSICIAN   I-Stat CG4 Lactic Acid, ED     Status: None   Collection Time: 10/24/17  1:19 PM  Result Value Ref Range  Lactic Acid, Venous 0.94 0.5 - 1.9 mmol/L  I-Stat Troponin, ED (not at [facility])     Status: Abnormal   Collection Time: 10/24/17  1:23 PM  Result Value Ref Range   Troponin i, poc 5.09 (HH) 0.00 - 0.08 ng/mL   Comment NOTIFIED PHYSICIAN    Comment 3            Comment: Due to the release kinetics of cTnI, a negative result within the first hours of the onset of symptoms does not rule out myocardial infarction with certainty. If myocardial infarction is still suspected, repeat the test at appropriate intervals.   Blood culture (routine x 2)     Status: None (Preliminary result)   Collection Time: 10/24/17  4:11 PM  Result Value Ref Range   Specimen Description BLOOD RIGHT ANTECUBITAL    Special Requests      BOTTLES DRAWN AEROBIC AND  ANAEROBIC Blood Culture adequate volume   Culture NO GROWTH < 24 HOURS    Report Status PENDING   Blood culture (routine x 2)     Status: None (Preliminary result)   Collection Time: 10/24/17  4:11 PM  Result Value Ref Range   Specimen Description BLOOD RIGHT HAND    Special Requests IN PEDIATRIC BOTTLE Blood Culture adequate volume    Culture NO GROWTH < 24 HOURS    Report Status PENDING   Troponin I (q 6hr x 3)     Status: Abnormal   Collection Time: 10/24/17  4:11 PM  Result Value Ref Range   Troponin I 5.46 (HH) <0.03 ng/mL    Comment: CRITICAL RESULT CALLED TO, READ BACK BY AND VERIFIED WITH: HANNAH,B RN 10/24/2017 1800 JORDANS   Heparin level (unfractionated)     Status: Abnormal   Collection Time: 10/24/17  8:21 PM  Result Value Ref Range   Heparin Unfractionated 0.24 (L) 0.30 - 0.70 IU/mL    Comment:        IF HEPARIN RESULTS ARE BELOW EXPECTED VALUES, AND PATIENT DOSAGE HAS BEEN CONFIRMED, SUGGEST FOLLOW UP TESTING OF ANTITHROMBIN III LEVELS.   Troponin I (q 6hr x 3)     Status: Abnormal   Collection Time: 10/24/17  8:21 PM  Result Value Ref Range   Troponin I 12.27 (HH) <0.03 ng/mL    Comment: CRITICAL VALUE NOTED.  VALUE IS CONSISTENT WITH PREVIOUSLY REPORTED AND CALLED VALUE.  Surgical PCR screen     Status: None   Collection Time: 10/24/17  8:55 PM  Result Value Ref Range   MRSA, PCR NEGATIVE NEGATIVE   Staphylococcus aureus NEGATIVE NEGATIVE    Comment: (NOTE) The Xpert SA Assay (FDA approved for NASAL specimens in patients 56 years of age and older), is one component of a comprehensive surveillance program. It is not intended to diagnose infection nor to guide or monitor treatment.   Troponin I (q 6hr x 3)     Status: Abnormal   Collection Time: 10/25/17  3:41 AM  Result Value Ref Range   Troponin I 9.05 (HH) <0.03 ng/mL    Comment: CRITICAL VALUE NOTED.  VALUE IS CONSISTENT WITH PREVIOUSLY REPORTED AND CALLED VALUE.  Heparin level (unfractionated)      Status: None   Collection Time: 10/25/17  3:41 AM  Result Value Ref Range   Heparin Unfractionated 0.31 0.30 - 0.70 IU/mL    Comment:        IF HEPARIN RESULTS ARE BELOW EXPECTED VALUES, AND PATIENT DOSAGE HAS BEEN CONFIRMED, SUGGEST FOLLOW UP TESTING  OF ANTITHROMBIN III LEVELS.   CBC     Status: Abnormal   Collection Time: 10/25/17  3:41 AM  Result Value Ref Range   WBC 6.5 4.0 - 10.5 K/uL   RBC 3.78 (L) 4.22 - 5.81 MIL/uL   Hemoglobin 10.3 (L) 13.0 - 17.0 g/dL   HCT 32.8 (L) 39.0 - 52.0 %   MCV 86.8 78.0 - 100.0 fL   MCH 27.2 26.0 - 34.0 pg   MCHC 31.4 30.0 - 36.0 g/dL   RDW 17.0 (H) 11.5 - 15.5 %   Platelets 224 150 - 400 K/uL  Hemoglobin A1c     Status: Abnormal   Collection Time: 10/25/17  3:41 AM  Result Value Ref Range   Hgb A1c MFr Bld 5.8 (H) 4.8 - 5.6 %    Comment: (NOTE) Pre diabetes:          5.7%-6.4% Diabetes:              >6.4% Glycemic control for   <7.0% adults with diabetes    Mean Plasma Glucose 119.76 mg/dL  Lipid panel     Status: Abnormal   Collection Time: 10/25/17  3:41 AM  Result Value Ref Range   Cholesterol 128 0 - 200 mg/dL   Triglycerides 104 <150 mg/dL   HDL 39 (L) >40 mg/dL   Total CHOL/HDL Ratio 3.3 RATIO   VLDL 21 0 - 40 mg/dL   LDL Cholesterol 68 0 - 99 mg/dL    Comment:        Total Cholesterol/HDL:CHD Risk Coronary Heart Disease Risk Table                     Men   Women  1/2 Average Risk   3.4   3.3  Average Risk       5.0   4.4  2 X Average Risk   9.6   7.1  3 X Average Risk  23.4   11.0        Use the calculated Patient Ratio above and the CHD Risk Table to determine the patient's CHD Risk.        ATP III CLASSIFICATION (LDL):  <100     mg/dL   Optimal  100-129  mg/dL   Near or Above                    Optimal  130-159  mg/dL   Borderline  160-189  mg/dL   High  >190     mg/dL   Very High   Basic metabolic panel     Status: Abnormal   Collection Time: 10/25/17  3:41 AM  Result Value Ref Range   Sodium 138 135 -  145 mmol/L   Potassium 4.7 3.5 - 5.1 mmol/L    Comment: DELTA CHECK NOTED NO VISIBLE HEMOLYSIS    Chloride 110 101 - 111 mmol/L   CO2 19 (L) 22 - 32 mmol/L   Glucose, Bld 93 65 - 99 mg/dL   BUN 10 6 - 20 mg/dL   Creatinine, Ser 1.16 0.61 - 1.24 mg/dL   Calcium 8.7 (L) 8.9 - 10.3 mg/dL   GFR calc non Af Amer >60 >60 mL/min   GFR calc Af Amer >60 >60 mL/min    Comment: (NOTE) The eGFR has been calculated using the CKD EPI equation. This calculation has not been validated in all clinical situations. eGFR's persistently <60 mL/min signify possible Chronic Kidney Disease.    Anion gap  9 5 - 15  APTT     Status: Abnormal   Collection Time: 10/25/17  3:41 AM  Result Value Ref Range   aPTT 77 (H) 24 - 36 seconds    Comment:        IF BASELINE aPTT IS ELEVATED, SUGGEST PATIENT RISK ASSESSMENT BE USED TO DETERMINE APPROPRIATE ANTICOAGULANT THERAPY.   Protime-INR     Status: None   Collection Time: 10/25/17  3:41 AM  Result Value Ref Range   Prothrombin Time 14.7 11.4 - 15.2 seconds   INR 1.16   TSH     Status: Abnormal   Collection Time: 10/25/17  7:55 AM  Result Value Ref Range   TSH 5.811 (H) 0.350 - 4.500 uIU/mL    Comment: Performed by a 3rd Generation assay with a functional sensitivity of <=0.01 uIU/mL.  Type and screen [place]     Status: None (Preliminary result)   Collection Time: 10/25/17  9:04 PM  Result Value Ref Range   ABO/RH(D) A POS    Antibody Screen NEG    Sample Expiration 10/28/2017    Unit Number [id number]    Blood Component Type RED CELLS,LR    Unit division 00    Status of Unit ALLOCATED    Transfusion Status OK TO TRANSFUSE    Crossmatch Result Compatible    Unit Number [id number]    Blood Component Type RED CELLS,LR    Unit division 00    Status of Unit ALLOCATED    Transfusion Status OK TO TRANSFUSE    Crossmatch Result Compatible    Unit Number [id number]    Blood Component Type RED CELLS,LR    Unit division  00    Status of Unit ALLOCATED    Transfusion Status OK TO TRANSFUSE    Crossmatch Result Compatible    Unit Number [id number]    Blood Component Type RED CELLS,LR    Unit division 00    Status of Unit ALLOCATED    Transfusion Status OK TO TRANSFUSE    Crossmatch Result Compatible   Prepare RBC (crossmatch)     Status: None   Collection Time: 10/25/17  9:04 PM  Result Value Ref Range   Order Confirmation ORDER PROCESSED BY BLOOD BANK   ABO/Rh     Status: None   Collection Time: 10/25/17  9:04 PM  Result Value Ref Range   ABO/RH(D) A POS     Dg Chest 2 View  Result Date: 10/24/2017 CLINICAL DATA:  Left chest pain beginning this morning. EXAM: CHEST  2 VIEW COMPARISON:  None. FINDINGS: Lungs are clear. Heart size is normal. No pneumothorax or pleural effusion. No focal bony abnormality. Contrast in the colon from CT abdomen and pelvis 10/21/2017 noted. IMPRESSION: No acute disease. Electronically Signed   By: Inge Rise M.D.   On: 10/24/2017 10:49    Review of Systems  Constitutional: Positive for diaphoresis, malaise/fatigue and weight loss. Negative for chills and fever.  HENT: Negative.   Eyes: Negative.   Respiratory: Positive for shortness of breath.   Cardiovascular: Positive for chest pain, palpitations, orthopnea and leg swelling.  Gastrointestinal: Positive for abdominal pain and nausea. Negative for constipation, diarrhea, melena and vomiting.  Genitourinary: Negative.   Musculoskeletal: Negative.   Skin:       Some resolving tophaceous changes in hands and feet  Neurological: Negative.   Endo/Heme/Allergies: Negative.   Psychiatric/Behavioral: Negative.    Blood pressure 125/68, pulse 61, temperature 97.6 F (36.4 C),  temperature source Oral, resp. rate 16, height 5' 9.5" (1.765 m), weight 73.3 kg (161 lb 9.6 oz), SpO2 (!) 81 %. Physical Exam  Constitutional: He appears well-developed and well-nourished. No distress.  HENT:  Head: Normocephalic and  atraumatic.  Mouth/Throat: Oropharynx is clear and moist.  Eyes: EOM are normal. Pupils are equal, round, and reactive to light.  Neck: Normal range of motion. Neck supple. No JVD present. No thyromegaly present.  Cardiovascular: Normal rate, regular rhythm, normal heart sounds and intact distal pulses. Exam reveals no friction rub.  No murmur heard. Respiratory: Effort normal and breath sounds normal.  GI: Soft. Bowel sounds are normal. He exhibits no distension and no mass. There is no tenderness.  Musculoskeletal: Normal range of motion. He exhibits edema.  Lymphadenopathy:    He has no cervical adenopathy.  Neurological: He is alert. He has normal strength. No cranial nerve deficit or sensory deficit.  Skin: Skin is warm and dry.  Psychiatric: He has a normal mood and affect.   Physicians   Panel Physicians Referring Physician Case Authorizing Physician  Leonie Man, MD (Primary)    Procedures   LEFT HEART CATH AND CORONARY ANGIOGRAPHY  Conclusion     Dist LM to Ost LAD lesion is 80% stenosed with 80% stenosed side branch in Ost Cx.  Prox Cx lesion is 99% stenosed -after the ostial lesion. There is TIMI I flow distal.  Ost LAD to Prox LAD lesion is 60% stenosed. Prox LAD lesion is 80% stenosed. Prox LAD to Mid LAD lesion is 70% stenosed.  Prox RCA to Mid RCA lesion is 100% stenosed. The 2 major downstream branches of the RCA (RPL & RPDA) fill via collaterals with brisk filling.  There is severe left ventricular systolic dysfunction. The left ventricular ejection fraction is less than 25% by visual estimate.  LV end diastolic pressure is moderately elevated.   Severe multivessel CAD with 80% distal left main into circumflex, 80% mid LAD, subtotal 99% proximal circumflex and 100% occluded RCA.   The RCA fills via collaterals from the LAD.  All distal vessels appeared to have good targets for bypass. 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  The patient is hemodynamically stable and not actively having anginal symptoms.  We have called CT surgical consultation for urgent CABG.  Unfortunately he is on Plavix.  If this will delay surgery, may need to consider mechanical support with possible balloon pump versus even Impella if symptoms were to worsen.  For now he will transfer to the CCU.  We will restart IV heparin 6 hours after TR band removal. We will keep him n.p.o. for now pending surgical consultation.  Recommend high-dose statin.  If blood pressure tolerates, would use beta-blocker    Glenetta Hew, MD Glenetta Hew, M.D., M.S. Interventional Cardiologist   Pager # [phone] Phone # [phone] [address] [place], [place] [address]    Indications   Non-ST elevation (NSTEMI) myocardial infarction ([place]) [I21.4 (ICD-10-CM)]  Procedural Details/Technique   Technical Details PCP: [facility]  Primary Cardiologist: Dr. Acie Fredrickson Referring Cardiologist: Dr. Dorris Carnes  71 y.o. male with PMH of gout, GERD, diverticulitis (recent admission with 2 small perforations) presenting to [facility] with chest pain. He ruled in for non-ST elevation MI, but has been pain-free. He is now referred for invasive evaluation with cardiac catheterization plus/minus PCI.  Time Out: Verified patient identification, verified procedure, site/side was marked, verified correct patient position, special equipment/implants available,  medications/allergies/relevent history reviewed, required imaging and test results available. Performed. Consent Signed.   Access:  * RIGHT Radial Artery: 6 Fr sheath -- Seldinger technique using Angiocath Micropuncture Kit * 10 mL radial cocktail IA; 4000 Units IV Heparin  Left Heart Catheterization: 5 Fr Catheters advanced or exchanged over a J-wire under direct fluoroscopic guidance into the ascending aorta; TIG 4.0 catheter advanced first.  * Left & Right Coronary Artery  Cineangiography: TIG 4.0 Catheter  * LV Hemodynamics (LV Gram): Angled Pigtail Catheter.  - After completion of angiography, the catheter was removed completely out of the body over wire without complication. -Initial imaging made it clear that the patient would require relatively urgent bypass surgery.  Radial sheath(s) removed in the Cath Lab with TR band placed for hemostasis.   TR Band: 1135 Hours; 10 mL air  MEDICATIONS * SQ Lidocaine 97m * Radial Cocktail: 3 mg Verapamil in 10 mL NS * Isovue Contrast: 90 mL * Heparin: 4000 Units  Fluoro time: 3.4 minutes. Dose Area Product: 151700mGycm2. Cumulative Air Kerma: 214 mGy.    Estimated blood loss <50 mL.  During this procedure the patient was administered the following to achieve and maintain moderate conscious sedation: Versed 1 mg, Fentanyl 25 mcg, while the patient's heart rate, blood pressure, and oxygen saturation were continuously monitored. The period of conscious sedation was 20 minutes, of which I was present face-to-face 100% of this time.  Complications   Complications documented before study signed (10/26/2017 6:24 AM EST)    No complications were associated with this study.  Documented by HLeonie Man MD - 10/25/2017 11:57 AM EST    Coronary Findings   Diagnostic  Dominance: Right  Left Main  Vessel is large.  Dist LM to Ost LAD lesion 80% stenosed with side branch in Ost Cx 80% stenosed  Dist LM to Ost LAD lesion is 80% stenosed with 80% stenosed side branch in Ost Cx. The lesion is type C, located at the bifurcation, concentric and irregular.  Left Anterior Descending  Vessel is large.  Ost LAD to Prox LAD lesion 60% stenosed  Ost LAD to Prox LAD lesion is 60% stenosed. The lesion is eccentric.  Prox LAD lesion 80% stenosed  Prox LAD lesion is 80% stenosed. The lesion is focal, concentric and smooth.  Prox LAD to Mid LAD lesion 70% stenosed  Prox LAD to Mid LAD lesion is 70% stenosed. The lesion is  focal, concentric and smooth.  First Septal Branch  Vessel is small in size.  Second Diagonal Branch  Vessel is moderate in size.  Left Circumflex  Vessel is large.  Prox Cx lesion 99% stenosed  Prox Cx lesion is 99% stenosed. Subtotally occluded -TIMI I flow  Second Obtuse Marginal Branch  Vessel is moderate in size.  Right Coronary Artery  Vessel is large.  Prox RCA to Mid RCA lesion 100% stenosed  Prox RCA to Mid RCA lesion is 100% stenosed. The lesion is located proximal to the major branch, irregular, ulcerative and heavily thrombotic with right-to-right collateral flow. Likely subacute occlusion  Acute Marginal Branch  Vessel is small in size. There is moderate disease in the vessel.  Right Posterior Descending Artery  Vessel is moderate in size. There is mild disease in the vessel.  Collaterals  RPDA filled by collaterals from 3rd Sept.    Inferior Septal  Collaterals  Inf Sept filled by collaterals from 1st Sept.    Collaterals  Inf Sept filled by collaterals from 2nd  Sept.    Right Posterior Atrioventricular Branch  Vessel is moderate in size. There is mild disease in the vessel.  Collaterals  Post Atrio filled by collaterals from 2nd Diag.    Intervention   No interventions have been documented.  Wall Motion      All segments of the heart are hypokinetic.          Left Heart   Left Ventricle The left ventricle is moderately dilated. There is severe left ventricular systolic dysfunction. LV end diastolic pressure is moderately elevated. The left ventricular ejection fraction is less than 25% by visual estimate. There are LV function abnormalities due to global hypokinesis.  Coronary Diagrams   Diagnostic Diagram       Implants     No implant documentation for this case.  MERGE Images   Show images for CARDIAC CATHETERIZATION   Link to Procedure Log   Procedure Log    Hemo Data    Most Recent Value  AO Systolic Pressure 448 mmHg  AO  Diastolic Pressure 60 mmHg  AO Mean 75 mmHg  LV Systolic Pressure 95 mmHg  LV Diastolic Pressure 11 mmHg  LV EDP 18 mmHg  Arterial Occlusion Pressure Extended Systolic Pressure 36 mmHg  Arterial Occlusion Pressure Extended Diastolic Pressure 0 mmHg  Arterial Occlusion Pressure Extended Mean Pressure 69 mmHg  Left Ventricular Apex Extended Systolic Pressure 94 mmHg  Left Ventricular Apex Extended Diastolic Pressure 4 mmHg  Left Ventricular Apex Extended EDP Pressure 18 mmHg    Result status: Final result                              *[place]*                   *[facility]*                         [address], [place] [address]                            [phone]  ------------------------------------------------------------------- Transthoracic Echocardiography  Patient:    Yusuke, Beza MR #:       [id number] Study Date: 10/25/2017 Gender:     M Age:        54 Height:     176.5 cm Weight:     72 kg BSA:        1.88 m^2 Pt. Status: Room:       2H11C   ADMITTING    Dorris Carnes, M.D.  [facility]  ORDERING     Glenetta Hew, MD  [place], MD  PERFORMING   [facility], Inpatient  SONOGRAPHER  Dance, Tiffany  cc:  ------------------------------------------------------------------- LV EF: 30% -   35%  ------------------------------------------------------------------- Indications:      Cardiomyopathy - ischemic 414.8.  ------------------------------------------------------------------- History:   PMH:   Dyspnea.  Atrial fibrillation.  PMH:  NSTEMI. Risk factors:  Dyslipidemia.  ------------------------------------------------------------------- Study Conclusions  - Left ventricle: The cavity size was mildly dilated. Systolic   function was moderately to severely reduced. The estimated   ejection fraction was in the range of 30% to 35%. Severe  hypokinesis of the  anterolateral, inferolateral, and inferior   myocardium; consistent with ischemia in the distribution of the   right coronary and left circumflex coronary artery. Features are   consistent with a pseudonormal left ventricular filling pattern,   with concomitant abnormal relaxation and increased filling   pressure (grade 2 diastolic dysfunction). - Aortic valve: There was mild regurgitation. - Mitral valve: There was mild to moderate regurgitation directed   centrally. The acceleration rate of the regurgitant jet was   reduced, consistent with a low dP/dt. - Left atrium: The atrium was mildly to moderately dilated.  Impressions:  - Regional wall motion abnormalities and decreased LV systolic   function are new since August 2018.  ------------------------------------------------------------------- Study data:  Comparison was made to the study of 07/27/2017.  Study status:  Routine.  Procedure:  The patient reported no pain pre or post test. Transthoracic echocardiography. Image quality was adequate.  Study completion:  There were no complications. Transthoracic echocardiography.  M-mode, complete 2D, spectral Doppler, and color Doppler.  Birthdate:  Patient birthdate: January 22, 1946.  Age:  Patient is 71 yr old.  Sex:  Gender: male. BMI: 23.1 kg/m^2.  Blood pressure:     125/76  Patient status: Inpatient.  Study date:  Study date: 10/25/2017. Study time: 03:08 PM.  Location:  Bedside.  -------------------------------------------------------------------  ------------------------------------------------------------------- Left ventricle:  The cavity size was mildly dilated. Systolic function was moderately to severely reduced. The estimated ejection fraction was in the range of 30% to 35%.  Regional wall motion abnormalities:   Severe hypokinesis of the anterolateral, inferolateral, and inferior myocardium; consistent with ischemia in the distribution of the right coronary and left  circumflex coronary artery. Features are consistent with a pseudonormal left ventricular filling pattern, with concomitant abnormal relaxation and increased filling pressure (grade 2 diastolic dysfunction).  ------------------------------------------------------------------- Aortic valve:   Trileaflet; normal thickness leaflets. Mobility was not restricted.  Doppler:  Transvalvular velocity was within the normal range. There was no stenosis. There was mild regurgitation.   ------------------------------------------------------------------- Aorta:  Aortic root: The aortic root was normal in size. Ascending aorta: The ascending aorta was normal in size.  ------------------------------------------------------------------- Mitral valve:   Structurally normal valve.   Mobility was not restricted.  Doppler:  Transvalvular velocity was within the normal range. There was no evidence for stenosis. There was mild to moderate regurgitation directed centrally. The acceleration rate of the regurgitant jet was reduced, consistent with a low dP/dt. Peak gradient (D): 3 mm Hg.  ------------------------------------------------------------------- Left atrium:  The atrium was mildly to moderately dilated.   ------------------------------------------------------------------- Right ventricle:  The cavity size was normal. Wall thickness was normal. Systolic function was normal.  ------------------------------------------------------------------- Pulmonic valve:    Structurally normal valve.   Cusp separation was normal.  Doppler:  Transvalvular velocity was within the normal range. There was no evidence for stenosis. There was trivial regurgitation.  ------------------------------------------------------------------- Tricuspid valve:   Structurally normal valve.    Doppler: Transvalvular velocity was within the normal range. There was  no regurgitation.  ------------------------------------------------------------------- Pulmonary artery:   The main pulmonary artery was normal-sized. Systolic pressure could not be accurately estimated.  ------------------------------------------------------------------- Right atrium:  The atrium was normal in size.  ------------------------------------------------------------------- Pericardium:  There was no pericardial effusion.  ------------------------------------------------------------------- Systemic veins: Inferior vena cava: The vessel was normal in size.  ------------------------------------------------------------------- Measurements   Left ventricle  Value        Reference  LV ID, ED, PLAX chordal          (H)     58.3  mm     43 - 52  LV ID, ES, PLAX chordal          (H)     48    mm     23 - 38  LV fx shortening, PLAX chordal   (L)     18    %      >=29  LV PW thickness, ED                      11    mm     ----------  IVS/LV PW ratio, ED                      0.91         <=1.3    Ventricular septum                       Value        Reference  IVS thickness, ED                        9.96  mm     ----------    LVOT                                     Value        Reference  LVOT ID, S                               22    mm     ----------  LVOT area                                3.8   cm^2   ----------    Aortic valve                             Value        Reference  Aortic regurg pressure half-time         449   ms     ----------    Aorta                                    Value        Reference  Aortic root ID, ED                       37    mm     ----------  Ascending aorta ID, A-P, S               29    mm     ----------    Left atrium                              Value        Reference  LA ID, A-P, ES  44    mm     ----------  LA ID/bsa, A-P                   (H)     2.34  cm/m^2 <=2.2  LA  volume, S                             71.1  ml     ----------  LA volume/bsa, S                         37.8  ml/m^2 ----------  LA volume, ES, 1-p A4C                   68    ml     ----------  LA volume/bsa, ES, 1-p A4C               36.1  ml/m^2 ----------  LA volume, ES, 1-p A2C                   73.5  ml     ----------  LA volume/bsa, ES, 1-p A2C               39.1  ml/m^2 ----------    Mitral valve                             Value        Reference  Mitral E-wave peak velocity              87.8  cm/s   ----------  Mitral A-wave peak velocity              44.6  cm/s   ----------  Mitral deceleration time                 229   ms     150 - 230  Mitral peak gradient, D                  3     mm Hg  ----------  Mitral E/A ratio, peak                   2            ----------  Mitral regurg VTI, PISA                  152   cm     ----------  Mitral ERO, PISA                         0.05  cm^2   ----------  Mitral regurg volume, PISA               8     ml     ----------    Right atrium                             Value        Reference  RA ID, S-I, ES, A4C              (H)     57.7  mm     34 - 49  RA area, ES, A4C  16.6  cm^2   8.3 - 19.5  RA volume, ES, A/L                       40.1  ml     ----------  RA volume/bsa, ES, A/L                   21.3  ml/m^2 ----------    Right ventricle                          Value        Reference  RV ID, minor axis, ED, A4C base          29.4  mm     ----------  TAPSE                                    27.5  mm     ----------    Pulmonic valve                           Value        Reference  Pulmonic regurg velocity, ED             123   cm/s   ----------  Pulmonic regurg gradient, ED             6     mm Hg  ----------  Legend: (L)  and  (H)  mark values outside specified reference range.  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 He had a normal LV in August. I agree that CABG is the only treatment available to him. His stenoses are not amenable to PCI. His operative risk is increased due to his poor LV function and ongoing problems with perforated diverticulitis. He is afebrile and has no leukocytosis so hopefully he can get through CABG without having further complications related to his diverticulitis. I will continue him on antibiotics postop for his perforated diverticulitis. I would expect his LV function to improve with revascularization. He did receive Plavix on admission so his risk of bleeding is increased and he may require platelet transfusion. I discussed the operative procedure with the patient and family including alternatives, benefits and risks; including but not limited to bleeding, blood transfusion, infection, stroke, myocardial infarction, graft failure, heart block requiring a permanent pacemaker, organ dysfunction, and death.  Takeshi Teasdale understands and agrees to proceed.  We will schedule surgery for tomorrow.  I spent 60 minutes performing this consultation and > 50% of this time was spent face to face counseling and coordinating the care of this patient's severe left main and multi-vessel coronary artery disease.  Gaye Pollack 10/25/2017

## 2017-10-26 NOTE — Anesthesia Procedure Notes (Signed)
Central Venous Catheter Insertion Performed by: Audry Pili, MD, anesthesiologist Start/End11/21/2018 8:05 AM, 10/26/2017 8:08 AM Patient location: Pre-op. Preanesthetic checklist: patient identified, IV checked, risks and benefits discussed, surgical consent, monitors and equipment checked, pre-op evaluation, timeout performed and anesthesia consent Hand hygiene performed  and maximum sterile barriers used  Total catheter length 10. PA cath was placed.Swan type:thermodilution PA Cath depth:50 Procedure performed without using ultrasound guided technique. Attempts: 1 Patient tolerated the procedure well with no immediate complications.

## 2017-10-26 NOTE — Op Note (Signed)
CARDIOVASCULAR SURGERY OPERATIVE NOTE  10/26/2017  Surgeon:  Gaye Pollack, MD  First Assistant: Jadene Pierini,  PA-C   Preoperative Diagnosis:  Severe left main and multi-vessel coronary artery disease with severe LV dysfunction   Postoperative Diagnosis:  Same   Procedure:  1. Median Sternotomy 2. Extracorporeal circulation 3.   Coronary artery bypass grafting x 4   Left internal mammary graft to the LAD  SVG to diagonal  SVG to OM2  SVG to PDA 4.   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 He had a normal LV in August. I agree that CABG is the only treatment available to him. His stenoses are not amenable to PCI. His operative risk is increased due to his poor LV function and ongoing problems with perforated diverticulitis. He is afebrile and has no leukocytosis so hopefully he can get through CABG without having further complications related to his diverticulitis. I will continue him on antibiotics postop for his perforated diverticulitis. I would expect his LV function to improve with revascularization. He did receive Plavix on admission so his risk of bleeding is increased and he may require platelet transfusion. I discussed the operative procedure with the patient and family including alternatives, benefits and risks; including but not limited to bleeding, blood transfusion, infection, stroke, myocardial infarction, graft failure, heart block requiring a permanent pacemaker, organ dysfunction, and death.  Mats Jeanlouis understands and agrees to proceed.     Preparation:  The patient was seen in the preoperative holding area and the correct patient, correct operation were  confirmed with the patient after reviewing the medical record and catheterization. The consent was signed by me. Preoperative antibiotics were given. A pulmonary arterial line and radial arterial line were placed by the anesthesia team. The patient was taken back to the operating room and positioned supine on the operating room table. After being placed under general endotracheal anesthesia by the anesthesia team a foley catheter was placed. The neck, chest, abdomen, and both legs were prepped with betadine soap and solution and draped in the usual sterile manner. A surgical time-out was taken and the correct patient and operative procedure were confirmed with the nursing and anesthesia staff.  TEE: performed by Renold Don, MD  Echo Findings   Left Ventricle Normal wall thickness. Cavity is mildly dilated. LV systolic function is moderately to severely reduced with an EF of 30-35%. Abnormal left ventricular diastolic function. Wall motion is abnormal. Inferolateral wall motion is hypokinetic. Anterolateral wall motion is hypokinetic. No thrombus present. No mass present.  Septum Normal atrial and ventricular septum with no septal defect and normal septal motion. No Patent Foramen Ovale present.  Left Atrium Cavity is moderately dilated. No thrombus present. No left atrial appendage. No mass present.  Aortic Valve The valve is trileaflet. Normal leaflet separation. No stenosis. Trace regurgitation. No AV vegetation.  Aorta No aneurysm present. No plaque present. No significant coarctation present. No aortic dissection present  Mitral Valve Dilated mitral annulus. No stenosis. Normal leaflet mobility. Mild to moderate regurgitation. The jet direction is centrally-directed. No prolapse present.  Right Ventricle Normal cavity size and ejection fraction. No thrombus present. No mass present.  Right Atrium Right atrium not well visualized.  Tricuspid Valve Tricuspid valve not well visualized.  Pulmonic  Valve The pulmonic valve was  not well visualized.  Pericardium No pericardial effusion.  Post-Op TEE AORTA Aorta unchanged from pre-bypass.  LEFT VENTRICLE Left ventricle unchanged from pre-bypass.  RIGHT VENTRICLE Right ventricle unchanged from pre-bypass.  AORTIC VALVE Aortic valve unchanged from pre-bypass MITRAL VALVE Mitral valve unchanged from pre-bypass.   The echo was reviewed by me. The patient had mild to moderate central MR that appeared to be due to LV and annular dilation. The patient had normal low PA pressures. I did not feel that mitral valve repair was indicated in this patient with ongoing perforated diverticulitis with a risk of bacteremia and potentially infection of a mitral valve ring. Inserting a ring would also mean that he would need to be on Coumadin for three months and at this time it is not clear if he would tolerate that with his diverticulitis and he may require surgical treatment of that.   Cardiopulmonary Bypass:  A median sternotomy was performed. The pericardium was opened in the midline. Right ventricular function appeared normal. The ascending aorta was of normal size and had no palpable plaque. There were no contraindications to aortic cannulation or cross-clamping. The patient was fully systemically heparinized and the ACT was maintained > 400 sec. The proximal aortic arch was cannulated with a 20 F aortic cannula for arterial inflow. Venous cannulation was performed via the right atrial appendage using a two-staged venous cannula. An antegrade cardioplegia/vent cannula was inserted into the mid-ascending aorta. Aortic occlusion was performed with a single cross-clamp. Systemic cooling to 32 degrees Centigrade and topical cooling of the heart with iced saline were used. Hyperkalemic antegrade cold blood cardioplegia was used to induce diastolic arrest and was then given at about 20 minute intervals throughout the period of arrest to maintain myocardial  temperature at or below 10 degrees centigrade. A temperature probe was inserted into the interventricular septum and an insulating pad was placed in the pericardium.   Left internal mammary harvest:  The left side of the sternum was retracted using the Rultract retractor. The left internal mammary artery was harvested as a pedicle graft. All side branches were clipped. It was a medium-sized vessel of good quality with excellent blood flow. It was ligated distally and divided. It was sprayed with topical papaverine solution to prevent vasospasm.   Endoscopic vein harvest:  The right greater saphenous vein was harvested endoscopically through a 2 cm incision medial to the right knee. It was harvested from the thigh. It was a medium-sized vein of good quality but divided at the knee and became small. Therefore a second segment of greater saphenous vein was harvested from the left leg in similar manner from the upper thigh to lower leg. This vein was of medium sized and good quality but also became smaller in the lower leg. The side branches were all ligated with 4-0 silk ties.    Coronary arteries:  The coronary arteries were examined.   LAD:  Large vessel distally with no disease distally. The diagonal was a large vessel with no distal disease.  LCX:  The OM was small and marginal for grafting but was subtotally occluded on cath.  RCA:  PDA was a medium sized vessel with no distal disease. There was patchy scar in the inferior wall from prior infarction.   Grafts:  1. LIMA to the LAD: 2.5 mm. It was sewn end to side using 8-0 prolene continuous suture. 2. SVG to diagonal:  1.75 mm. It was sewn end to side using 7-0 prolene continuous suture. 3.  SVG to OM2:  1.4 mm. It was sewn end to side using 8-0 prolene continuous suture. 4. SVG to PDA:  1.6 mm. It was sewn end to side using 7-0 prolene continuous suture.  The proximal vein graft anastomoses were performed to the mid-ascending aorta  using continuous 6-0 prolene suture. Graft markers were placed around the proximal anastomoses.   Completion:  The patient was rewarmed to 37 degrees Centigrade. The clamp was removed from the LIMA pedicle and there was rapid warming of the septum and return of ventricular fibrillation. The crossclamp was removed with a time of 103 minutes. There was spontaneous return of sinus rhythm. The distal and proximal anastomoses were checked for hemostasis. The position of the grafts was satisfactory. Two temporary epicardial pacing wires were placed on the right atrium and two on the right ventricle. The patient was weaned from CPB without difficulty on no inotropes. CPB time was 134 minutes. Cardiac output was 4 LPM. Heparin was fully reversed with protamine and the aortic and venous cannulas removed. Hemostasis was achieved. Mediastinal and left pleural drainage tubes were placed. The sternum was closed with  #6 stainless steel wires. The fascia was closed with continuous # 1 vicryl suture. The subcutaneous tissue was closed with 2-0 vicryl continuous suture. The skin was closed with 3-0 vicryl subcuticular suture. All sponge, needle, and instrument counts were reported correct at the end of the case. Dry sterile dressings were placed over the incisions and around the chest tubes which were connected to pleurevac suction. The patient was then transported to the surgical intensive care unit in critical but stable condition.

## 2017-10-26 NOTE — Anesthesia Procedure Notes (Signed)
Central Venous Catheter Insertion Performed by: Audry Pili, MD, anesthesiologist Start/End11/21/2018 7:55 AM, 10/26/2017 8:08 AM Patient location: Pre-op. Preanesthetic checklist: patient identified, IV checked, risks and benefits discussed, surgical consent, monitors and equipment checked, pre-op evaluation, timeout performed and anesthesia consent Position: Trendelenburg Lidocaine 1% used for infiltration and patient sedated Hand hygiene performed , maximum sterile barriers used  and Seldinger technique used Catheter size: 8.5 Fr Total catheter length 10. Central line was placed.Sheath introducer Swan type:thermodilution PA Cath depth:50 Procedure performed using ultrasound guided technique. Ultrasound Notes:anatomy identified, needle tip was noted to be adjacent to the nerve/plexus identified, no ultrasound evidence of intravascular and/or intraneural injection and image(s) printed for medical record Attempts: 1 Following insertion, line sutured, dressing applied and Biopatch. Post procedure assessment: blood return through all ports, free fluid flow and no air  Patient tolerated the procedure well with no immediate complications.

## 2017-10-26 NOTE — Progress Notes (Signed)
RT attempted wean on patient. Patient failed at this time. RT to attempt at later time.

## 2017-10-26 NOTE — Brief Op Note (Signed)
10/24/2017 - 10/26/2017  1:10 PM  PATIENT:  Dean [name]  71 y.o. male  PRE-OPERATIVE DIAGNOSIS:  CAD  POST-OPERATIVE DIAGNOSIS:  CAD  PROCEDURE:  Procedure(s): CORONARY ARTERY BYPASS GRAFTING (CABG) x four , using left internal mammary artery and bilateral legs greater saphenous vein harvested endoscopically (N/A) TRANSESOPHAGEAL ECHOCARDIOGRAM (TEE) (N/A) LIMA-LAD SVG-OM SVG-RAMUS SVG-DIAG  SURGEON:  Surgeon(s) and Role:    * [name], Fernande Boyden, MD - Primary  PHYSICIAN ASSISTANT: WAYNE GOLD PA-C  ANESTHESIA:   general  EBL:     BLOOD ADMINISTERED:500 CC PRBC  DRAINS: MEDIASTINAL AND PLEURAL TUBES   LOCAL MEDICATIONS USED:  NONE  SPECIMEN:  No Specimen  DISPOSITION OF SPECIMEN:  N/A  COUNTS:  YES  TOURNIQUET:  * No tourniquets in log *  DICTATION: .Dragon Dictation  PLAN OF CARE: Admit to inpatient   PATIENT DISPOSITION:  ICU - intubated and hemodynamically stable.   Delay start of Pharmacological VTE agent (>24hrs) due to surgical blood loss or risk of bleeding: yes  COMPLICATIONS: NO KNOWN

## 2017-10-26 NOTE — Progress Notes (Addendum)
Patient ID: Rafan Sanders, male   DOB: Feb 07, 1946, 71 y.o.   MRN: [id number]  TCTS Evening Rounds:   Hemodynamically stable  CI = 2 on low dose dopamine  Has started to wake up on vent.   Urine output good  CT output low  CBC    Component Value Date/Time   WBC 5.2 10/26/2017 1517   RBC 3.36 (L) 10/26/2017 1517   HGB 8.5 (L) 10/26/2017 1524   HCT 25.0 (L) 10/26/2017 1524   PLT 141 (L) 10/26/2017 1517   MCV 84.8 10/26/2017 1517   MCH 27.4 10/26/2017 1517   MCHC 32.3 10/26/2017 1517   RDW 16.6 (H) 10/26/2017 1517   LYMPHSABS 2.2 10/21/2017 1218   MONOABS 0.6 10/21/2017 1218   EOSABS 0.1 10/21/2017 1218   BASOSABS 0.0 10/21/2017 1218     BMET    Component Value Date/Time   NA 140 10/26/2017 1524   NA 139 09/02/2017 1335   K 3.8 10/26/2017 1524   CL 102 10/26/2017 1410   CO2 22 10/26/2017 0547   GLUCOSE 123 (H) 10/26/2017 1524   BUN 6 10/26/2017 1410   BUN 16 09/02/2017 1335   CREATININE 1.00 10/26/2017 1410   CALCIUM 8.5 (L) 10/26/2017 0547   GFRNONAA 54 (L) 10/26/2017 0547   GFRAA >60 10/26/2017 0547     A/P:  Stable postop course. Continue current plans

## 2017-10-26 NOTE — Procedures (Signed)
Extubation Procedure Note  Patient Details:   Name: Dean [name] DOB: 05/03/1946 MRN: [id number]   Airway Documentation:     Evaluation  O2 sats: stable throughout Complications: No apparent complications Patient did tolerate procedure well. Bilateral Breath Sounds: Clear, Diminished   Yes    Patient extubated without any complications. Placed patient on 4LNC. Patient has an adequate cough and is able to speak clearly.  Blanchie Serve 10/26/2017, 10:24 PM

## 2017-10-26 NOTE — Progress Notes (Signed)
Pharmacy note: zinacef  71 yo male s/p CABG on post-op zinacef and also on Zosyn for intra-abdominal infection.   Plan -Discontinue zinacef (Zosyn will cover for post-op prophylaxis needs).  Thank you, Mitzi Hansen

## 2017-10-26 NOTE — Progress Notes (Signed)
NIF-20, VC 1.2L

## 2017-10-26 NOTE — Progress Notes (Signed)
  Echocardiogram Echocardiogram Transesophageal has been performed.  Dean [name] 10/26/2017, 9:49 AM

## 2017-10-27 ENCOUNTER — Inpatient Hospital Stay (HOSPITAL_COMMUNITY): Payer: Medicare Other

## 2017-10-27 LAB — CBC
HEMATOCRIT: 25.8 % — AB (ref 39.0–52.0)
HEMATOCRIT: 24.5 % — AB (ref 39.0–52.0)
HEMOGLOBIN: 8.4 g/dL — AB (ref 13.0–17.0)
HEMOGLOBIN: 7.9 g/dL — AB (ref 13.0–17.0)
MCH: 27.8 pg (ref 26.0–34.0)
MCH: 27.9 pg (ref 26.0–34.0)
MCHC: 32.6 g/dL (ref 30.0–36.0)
MCHC: 32.2 g/dL (ref 30.0–36.0)
MCV: 85.4 fL (ref 78.0–100.0)
MCV: 86.6 fL (ref 78.0–100.0)
Platelets: 152 10*3/uL (ref 150–400)
Platelets: 158 10*3/uL (ref 150–400)
RBC: 3.02 MIL/uL — ABNORMAL LOW (ref 4.22–5.81)
RBC: 2.83 MIL/uL — ABNORMAL LOW (ref 4.22–5.81)
RDW: 16.9 % — AB (ref 11.5–15.5)
RDW: 17.2 % — AB (ref 11.5–15.5)
WBC: 4.1 10*3/uL (ref 4.0–10.5)
WBC: 5.8 10*3/uL (ref 4.0–10.5)

## 2017-10-27 LAB — BASIC METABOLIC PANEL
Anion gap: 6 (ref 5–15)
BUN: 6 mg/dL (ref 6–20)
CALCIUM: 7.8 mg/dL — AB (ref 8.9–10.3)
CHLORIDE: 102 mmol/L (ref 101–111)
CO2: 25 mmol/L (ref 22–32)
CREATININE: 1 mg/dL (ref 0.61–1.24)
GFR calc non Af Amer: >60 mL/min (ref >60)
GLUCOSE: 113 mg/dL — AB (ref 65–99)
Potassium: 4.6 mmol/L (ref 3.5–5.1)
Sodium: 133 mmol/L — ABNORMAL LOW (ref 135–145)

## 2017-10-27 LAB — MAGNESIUM
MAGNESIUM: 2.4 mg/dL (ref 1.7–2.4)
Magnesium: 2.5 mg/dL — ABNORMAL HIGH (ref 1.7–2.4)

## 2017-10-27 LAB — CREATININE, SERUM
Creatinine, Ser: 1.24 mg/dL (ref 0.61–1.24)
GFR calc Af Amer: >60 mL/min (ref >60)
GFR, EST NON AFRICAN AMERICAN: 58 mL/min — AB (ref >60)

## 2017-10-27 LAB — GLUCOSE, CAPILLARY
Glucose-Capillary: 111 mg/dL — ABNORMAL HIGH (ref 65–99)
Glucose-Capillary: 109 mg/dL — ABNORMAL HIGH (ref 65–99)
Glucose-Capillary: 122 mg/dL — ABNORMAL HIGH (ref 65–99)

## 2017-10-27 MED ORDER — ENOXAPARIN SODIUM 30 MG/0.3ML ~~LOC~~ SOLN: 30.0000 mg | Freq: Every day | SUBCUTANEOUS | Status: DC

## 2017-10-27 MED ORDER — ASPIRIN 81 MG PO CHEW: 81.0000 mg | CHEWABLE_TABLET | Freq: Every day | ORAL | Status: DC

## 2017-10-27 MED ORDER — AMIODARONE HCL IN DEXTROSE 360-4.14 MG/200ML-% IV SOLN
60.0000 mg/h | INTRAVENOUS | Status: AC
  Administered 2017-10-27 (×2): 60 mg/h via INTRAVENOUS
  Filled 2017-10-27 (×2): qty 200

## 2017-10-27 MED ORDER — ASPIRIN EC 81 MG PO TBEC
81.0000 mg | DELAYED_RELEASE_TABLET | Freq: Every day | ORAL | Status: DC
  Administered 2017-10-27 – 2017-11-06 (×11): 81 mg via ORAL
  Filled 2017-10-27 (×11): qty 1

## 2017-10-27 MED ORDER — AMIODARONE HCL IN DEXTROSE 360-4.14 MG/200ML-% IV SOLN
30.0000 mg/h | INTRAVENOUS | Status: DC
  Administered 2017-10-27: 30 mg/h via INTRAVENOUS
  Filled 2017-10-27 (×2): qty 200

## 2017-10-27 MED ORDER — INSULIN ASPART 100 UNIT/ML ~~LOC~~ SOLN: 0.0000 [IU] | SUBCUTANEOUS | Status: DC

## 2017-10-27 MED ORDER — AMIODARONE LOAD VIA INFUSION
150.0000 mg | Freq: Once | INTRAVENOUS | Status: AC
  Administered 2017-10-27: 150 mg via INTRAVENOUS
  Filled 2017-10-27: qty 83.34

## 2017-10-27 NOTE — Progress Notes (Signed)
TCTS BRIEF SICU PROGRESS NOTE  1 Day Post-Op  S/P Procedure(s) (LRB): CORONARY ARTERY BYPASS GRAFTING (CABG) x four , using left internal mammary artery and bilateral legs greater saphenous vein harvested endoscopically (N/A) TRANSESOPHAGEAL ECHOCARDIOGRAM (TEE) (N/A)   Fairly stable day but still on low dose dopamine and Neo and received albumin bolus earlier for marginal BP with low filling pressures Breathing comfortably UOP adequate Labs okay although Hgb down slightly 7.9  Plan: Continue current plan  Rexene Alberts, MD 10/27/2017 6:04 PM

## 2017-10-27 NOTE — Progress Notes (Signed)
1 Day Post-Op Procedure(s) (LRB): CORONARY ARTERY BYPASS GRAFTING (CABG) x four , using left internal mammary artery and bilateral legs greater saphenous vein harvested endoscopically (N/A) TRANSESOPHAGEAL ECHOCARDIOGRAM (TEE) (N/A) Subjective: No specific complaints  Objective: Vital signs in last 24 hours: Temp:  [96.4 F (35.8 C)-99 F (37.2 C)] 98.4 F (36.9 C) (11/22 0700) Pulse Rate:  [66-83] 81 (11/22 0700) Cardiac Rhythm: Atrial paced (11/22 0400) Resp:  [8-28] 17 (11/22 0700) BP: (90-121)/(62-84) 102/66 (11/22 0700) SpO2:  [92 %-100 %] 100 % (11/22 0700) Arterial Line BP: (90-130)/(50-78) 114/53 (11/22 0700) FiO2 (%):  [40 %-50 %] 40 % (11/21 2000) Weight:  [73.1 kg (161 lb 1.6 oz)] 73.1 kg (161 lb 1.6 oz) (11/22 0500)  Hemodynamic parameters for last 24 hours: PAP: (10-56)/(1-45) 21/6 CO:  [3.5 L/min-5.2 L/min] 4.6 L/min CI:  [1.8 L/min/m2-2.8 L/min/m2] 2.4 L/min/m2  Intake/Output from previous day: 11/21 0701 - 11/22 0700 In: 4506 [I.V.:2871; Blood:435; IV Piggyback:1200] Out: 9458 [Urine:5180; Blood:1115; Chest Tube:290] Intake/Output this shift: No intake/output data recorded.  General appearance: alert and cooperative Neurologic: intact Heart: irregularly irregular rhythm Lungs: clear to auscultation bilaterally Abdomen: soft, non-tender; bowel sounds normal; no masses,  no organomegaly Extremities: edema mild Wound: dressings dry  Lab Results: Recent Labs    10/26/17 2101 10/26/17 2111 10/27/17 0327  WBC 4.9  --  4.1  HGB 8.7* 8.5* 8.4*  HCT 26.5* 25.0* 25.8*  PLT 135*  --  152   BMET:  Recent Labs    10/26/17 0547  10/26/17 2111 10/27/17 0327  NA 136   < > 135 133*  K 4.0   < > 4.8 4.6  CL 106   < > 103 102  CO2 22  --   --  25  GLUCOSE 91   < > 153* 113*  BUN 7   < > 6 6  CREATININE 1.31*   < > 0.90 1.00  CALCIUM 8.5*  --   --  7.8*   < > = values in this interval not displayed.    PT/INR:  Recent Labs    10/26/17 1517  LABPROT  17.6*  INR 1.46   ABG    Component Value Date/Time   PHART 7.414 10/26/2017 2330   HCO3 23.3 10/26/2017 2330   TCO2 24 10/26/2017 2330   ACIDBASEDEF 1.0 10/26/2017 2330   O2SAT 99.0 10/26/2017 2330   CBG (last 3)  Recent Labs    10/26/17 2332 10/27/17 0337 10/27/17 0836  GLUCAP 118* 111* 109*   CXR: ok  ECG: atrial fibrillation with RVR, lateral ST-T changes  Assessment/Plan: S/P Procedure(s) (LRB): CORONARY ARTERY BYPASS GRAFTING (CABG) x four , using left internal mammary artery and bilateral legs greater saphenous vein harvested endoscopically (N/A) TRANSESOPHAGEAL ECHOCARDIOGRAM (TEE) (N/A)   POD 1  He is hemodynamically stable on dop 3, low dose neo. Preop EF 30-35%. Wean as tolerated. Hold off on beta blocker for now.  Postop atrial fib with RVR: amio started  Preop perforated diverticulitis with contained perforation in pelvis. Continue Zosyn.  DC chest tubes, swan, arterial line.   LOS: 3 days    Dean [name] 10/27/2017

## 2017-10-28 ENCOUNTER — Inpatient Hospital Stay (HOSPITAL_COMMUNITY): Payer: Medicare Other

## 2017-10-28 ENCOUNTER — Encounter (HOSPITAL_COMMUNITY): Payer: Self-pay | Admitting: Surgery

## 2017-10-28 LAB — POCT I-STAT, CHEM 8
BUN: 5 mg/dL — AB (ref 6–20)
BUN: 8 mg/dL (ref 6–20)
CALCIUM ION: 1 mmol/L — AB (ref 1.15–1.40)
CALCIUM ION: 1.14 mmol/L — AB (ref 1.15–1.40)
CHLORIDE: 98 mmol/L — AB (ref 101–111)
CREATININE: 0.8 mg/dL (ref 0.61–1.24)
Chloride: 100 mmol/L — ABNORMAL LOW (ref 101–111)
Creatinine, Ser: 1.1 mg/dL (ref 0.61–1.24)
GLUCOSE: 113 mg/dL — AB (ref 65–99)
Glucose, Bld: 94 mg/dL (ref 65–99)
HCT: 20 % — ABNORMAL LOW (ref 39.0–52.0)
HCT: 24 % — ABNORMAL LOW (ref 39.0–52.0)
Hemoglobin: 6.8 g/dL — CL (ref 13.0–17.0)
Hemoglobin: 8.2 g/dL — ABNORMAL LOW (ref 13.0–17.0)
Potassium: 6.3 mmol/L (ref 3.5–5.1)
Potassium: 4.4 mmol/L (ref 3.5–5.1)
SODIUM: 135 mmol/L (ref 135–145)
Sodium: 135 mmol/L (ref 135–145)
TCO2: 25 mmol/L (ref 22–32)
TCO2: 23 mmol/L (ref 22–32)

## 2017-10-28 LAB — CBC
HEMATOCRIT: 23.8 % — AB (ref 39.0–52.0)
HEMOGLOBIN: 7.7 g/dL — AB (ref 13.0–17.0)
MCH: 27.9 pg (ref 26.0–34.0)
MCHC: 32.4 g/dL (ref 30.0–36.0)
MCV: 86.2 fL (ref 78.0–100.0)
Platelets: 144 10*3/uL — ABNORMAL LOW (ref 150–400)
RBC: 2.76 MIL/uL — AB (ref 4.22–5.81)
RDW: 17.4 % — ABNORMAL HIGH (ref 11.5–15.5)
WBC: 5.8 10*3/uL (ref 4.0–10.5)

## 2017-10-28 LAB — BASIC METABOLIC PANEL
ANION GAP: 6 (ref 5–15)
BUN: 12 mg/dL (ref 6–20)
CO2: 25 mmol/L (ref 22–32)
Calcium: 7.9 mg/dL — ABNORMAL LOW (ref 8.9–10.3)
Chloride: 101 mmol/L (ref 101–111)
Creatinine, Ser: 1.22 mg/dL (ref 0.61–1.24)
GFR calc Af Amer: >60 mL/min (ref >60)
GFR, EST NON AFRICAN AMERICAN: 59 mL/min — AB (ref >60)
GLUCOSE: 116 mg/dL — AB (ref 65–99)
POTASSIUM: 4.4 mmol/L (ref 3.5–5.1)
Sodium: 132 mmol/L — ABNORMAL LOW (ref 135–145)

## 2017-10-28 LAB — PREPARE RBC (CROSSMATCH)

## 2017-10-28 LAB — GLUCOSE, CAPILLARY: GLUCOSE-CAPILLARY: 103 mg/dL — AB (ref 65–99)

## 2017-10-28 MED ORDER — TRAMADOL HCL 50 MG PO TABS
50.0000 mg | ORAL_TABLET | Freq: Four times a day (QID) | ORAL | Status: DC | PRN
  Administered 2017-10-28 – 2017-10-29 (×3): 50 mg via ORAL
  Filled 2017-10-28 (×6): qty 1

## 2017-10-28 MED ORDER — FUROSEMIDE 10 MG/ML IJ SOLN
40.0000 mg | Freq: Once | INTRAMUSCULAR | Status: AC
  Administered 2017-10-28: 40 mg via INTRAVENOUS
  Filled 2017-10-28: qty 4

## 2017-10-28 MED ORDER — SODIUM CHLORIDE 0.9 % IV SOLN: Freq: Once | INTRAVENOUS | Status: DC

## 2017-10-28 MED ORDER — AMIODARONE HCL 200 MG PO TABS
400.0000 mg | ORAL_TABLET | Freq: Two times a day (BID) | ORAL | Status: DC
  Administered 2017-10-28 – 2017-11-01 (×10): 400 mg via ORAL
  Filled 2017-10-28 (×10): qty 2

## 2017-10-28 MED FILL — Thrombin For Soln 5000 Unit: CUTANEOUS | Qty: 5000 | Status: AC

## 2017-10-28 NOTE — Progress Notes (Signed)
2 Days Post-Op Procedure(s) (LRB): CORONARY ARTERY BYPASS GRAFTING (CABG) x four , using left internal mammary artery and bilateral legs greater saphenous vein harvested endoscopically (N/A) TRANSESOPHAGEAL ECHOCARDIOGRAM (TEE) (N/A) Subjective: No specific complaints. Just feels worn out. Ambulated.   Objective: Vital signs in last 24 hours: Temp:  [97.4 F (36.3 C)-98.6 F (37 C)] 97.4 F (36.3 C) (11/23 0404) Pulse Rate:  [34-105] 80 (11/23 0600) Cardiac Rhythm: Atrial fibrillation (11/23 0500) Resp:  [14-25] 14 (11/23 0600) BP: (80-127)/(53-79) 106/73 (11/23 0600) SpO2:  [91 %-100 %] 99 % (11/23 0600) Arterial Line BP: (83-135)/(42-69) 107/59 (11/23 0600) Weight:  [71.4 kg (157 lb 6.5 oz)] 71.4 kg (157 lb 6.5 oz) (11/23 0500)  Hemodynamic parameters for last 24 hours: PAP: (21-30)/(11-17) 30/12  Intake/Output from previous day: 11/22 0701 - 11/23 0700 In: 2222.1 [P.O.:240; I.V.:1932.1; IV Piggyback:50] Out: 895 [Urine:815; Chest Tube:80] Intake/Output this shift: No intake/output data recorded.  General appearance: alert and cooperative Heart: regular rate and rhythm, S1, S2 normal, no murmur, click, rub or gallop Lungs: clear to auscultation bilaterally Abdomen: soft, non-tender; bowel sounds normal; no masses,  no organomegaly Extremities: edema mild Wound: dressings dry  Lab Results: Recent Labs    10/27/17 1629 10/28/17 0400  WBC 5.8 5.8  HGB 7.9* 7.7*  HCT 24.5* 23.8*  PLT 158 144*   BMET:  Recent Labs    10/27/17 0327 10/27/17 1622 10/27/17 1629 10/28/17 0400  NA 133* 135  --  132*  K 4.6 4.4  --  4.4  CL 102 98*  --  101  CO2 25  --   --  25  GLUCOSE 113* 113*  --  116*  BUN 6 8  --  12  CREATININE 1.00 1.10 1.24 1.22  CALCIUM 7.8*  --   --  7.9*    PT/INR:  Recent Labs    10/26/17 1517  LABPROT 17.6*  INR 1.46   ABG    Component Value Date/Time   PHART 7.414 10/26/2017 2330   HCO3 23.3 10/26/2017 2330   TCO2 23 10/27/2017 1622   ACIDBASEDEF 1.0 10/26/2017 2330   O2SAT 99.0 10/26/2017 2330   CBG (last 3)  Recent Labs    10/27/17 0337 10/27/17 0836 10/27/17 1941  GLUCAP 111* 109* 122*   CXR: clear  Assessment/Plan: S/P Procedure(s) (LRB): CORONARY ARTERY BYPASS GRAFTING (CABG) x four , using left internal mammary artery and bilateral legs greater saphenous vein harvested endoscopically (N/A) TRANSESOPHAGEAL ECHOCARDIOGRAM (TEE) (N/A)  POD 2  He is still on neo and dopamine to support BP but BP looks better this am. He was VVI pacing at 80 when I arrived which may have been lowering BP some. Will transfuse a unit of blood for Hgb 7.7 which should help with weaning pressors.  Postop atrial fib: now sinus 50's under pacer. Will switch amio to po. Continue AAI pacing until off pressors.  Preop and postop anemia: will transfuse a unit of PRBC's   Volume excess: slow diuresis as BP allows  Perforated diverticulitis preop. Continue Zosyn. He is afebrile with normal WBC ct.  Continue ambulation and IS.     LOS: 4 days    Gaye Pollack 10/28/2017

## 2017-10-28 NOTE — Progress Notes (Signed)
TCTS BRIEF SICU PROGRESS NOTE  2 Days Post-Op  S/P Procedure(s) (LRB): CORONARY ARTERY BYPASS GRAFTING (CABG) x four , using left internal mammary artery and bilateral legs greater saphenous vein harvested endoscopically (N/A) TRANSESOPHAGEAL ECHOCARDIOGRAM (TEE) (N/A)   Stable day Now off dopamine but still on low dose Neo Diuresing very well  Plan: Continue current plan  Rexene Alberts, MD 10/28/2017 6:00 PM

## 2017-10-28 NOTE — Discharge Summary (Signed)
Physician Discharge Summary  Patient ID: Dean [name] MRN: [id number] DOB/AGE: 12-14-1945 71 y.o.  Admit date: 10/24/2017 Discharge date: 11/06/2017  Admission Diagnoses:  Patient Active Problem List   Diagnosis Date Noted  . NSTEMI (non-ST elevated myocardial infarction) ([place]) 10/24/2017  . Diverticulitis of colon with perforation 10/07/2017  . Diarrhea 10/03/2017  . Iron deficiency anemia 10/03/2017  . Loss of weight 10/03/2017  . Left sided abdominal pain of unknown cause 10/03/2017  . Abnormal EKG 07/25/2017  . Chest pain 07/25/2017   Discharge Diagnoses:   Patient Active Problem List   Diagnosis Date Noted  . 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 NSTEMI (non-ST elevated myocardial infarction) ([place]) 10/24/2017  . Diverticulitis of colon with perforation 10/07/2017  . Diarrhea 10/03/2017  . Iron deficiency anemia 10/03/2017  . Loss of weight 10/03/2017  . Left sided abdominal pain of unknown cause 10/03/2017  . Abnormal EKG 07/25/2017  . Chest pain 07/25/2017   Discharged Condition: good  History of Present Illness:  [name] is a 71 yo male with history of tophaceous gout.  He recently got treated for perforated diverticulitis with ABX.  The patient had complaints for 1-2 years prior to diagnoses.  He had a CT scan of this abdomen on 10/07/2017 that showed the perforation.  He had one day of IV ABX, but ultimately requested discharge home on oral ABX.  Repeat CT scan continued to show the perforated diverticulum and he agreed to return to the hospital for IV ABX.  However, on arrival he was told he could complete his treatment with oral and returned home.  The patient developed complaints of swelling in his legs.  He was ordered to have doppler studies performed, but he ultimately woke up with chest pain, shortness of breath, and diaphoresis and presented to the ED for evaluation.  He was ruled in for NSTEMI, placed on Heparin and admitted for further care.  Hospital Course:    He underwent cardiac catheterization on 10/25/2017.  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  It was felt coronary bypass grafting would be indicated and TCTS consult was obtained.  He was evaluated by Dr. Cyndia Bent who felt coronary bypass grafting would be indicated.  The risks and benefits of the procedure were explained to the patient and he was agreeable to proceed.  He was taken to the operating room on 10/26/2017.  He underwent CABG x 4 utilizing LIMA to LAD, SVG to Diagonl, SVG to OM2, and SVG to PDA.  He also underwent endoscopic harvest of greater saphenous vein from right and left leg.  He tolerated the procedure without difficulty and was taken to the SICU in stable condition.  The patient was extubated the evening of surgery.  During his stay in the SICU the patient's chest tubes and arterial lines were removed without difficulty.  He was weaned off Dopamine and Neo as BP allowed.  He developed expected post operative blood loss anemia and was transfused 1 unit of packed cells.  He developed rapid Atrial Fibrillation.  He was treated with IV Amiodarone with successful conversion to NSR.    He was transferred to the telemetry unit on 10/29/2017.  He continues to make progress.   Despite Amiodarone therapy the patient patient continued to have brief episodes of Atrial fibrillation and at times, his HR was increased.  Over the past several days, he has had less episodes of increased HR. He was ultimately started on  Coumadin therapy for this.  His INR increased to 3.09 on 11/05/2017 and but did decrease as Coumadin held for last 3 days. He will be sent on Coumadin 1 mg and will begin taking 11/06/2017.  His goal INR range will be 2.0-2.5. He will have his PT and INR checked on Tuesday 11/08/2017. He did have bradycardia on 11/28. Because of this, Amiodarone was decreased. He was not on a BB. His pacing wires were removed without difficulty.  He developed complaints of loose stools  and all stool softeners were discontinued.  C. Diff was checked and was negative. He was hypokalemic and his potassium was supplemented.  He was treated with IV diuretics for her preoperative perforated diverticulum.  He lives alone and would benefit from SNF placement.  However, the patient ultimately declined placement stating his neighbors would help him out and he would prefer to be at home.  In regards to his perforated diverticulum, he will require a 7 day course of Augmentin at discharge to complete treatment (per GI).  He is ambulating independently.  He is tolerating a diet.  As discussed with Dr. Servando Snare, he is medically stable for discharge home today.        Significant Diagnostic Studies: angiography:    Dist LM to Ost LAD lesion is 80% stenosed with 80% stenosed side branch in Ost Cx.  Prox Cx lesion is 99% stenosed -after the ostial lesion. There is TIMI I flow distal.  Ost LAD to Prox LAD lesion is 60% stenosed. Prox LAD lesion is 80% stenosed. Prox LAD to Mid LAD lesion is 70% stenosed.  Prox RCA to Mid RCA lesion is 100% stenosed. The 2 major downstream branches of the RCA (RPL & RPDA) fill via collaterals with brisk filling.  There is severe left ventricular systolic dysfunction. The left ventricular ejection fraction is less than 25% by visual estimate.  LV end diastolic pressure is moderately elevated.   Severe multivessel CAD with 80% distal left main into circumflex, 80% mid LAD, subtotal 99% proximal circumflex and 100% occluded RCA.   The RCA fills via collaterals from the LAD.  All distal vessels appeared to have good targets for bypass. 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  Treatments: surgery:   1. Median Sternotomy 2. Extracorporeal circulation 3.   Coronary artery bypass grafting x 4   Left internal mammary graft to the LAD  SVG to diagonal  SVG to OM2  SVG to PDA 4.   Endoscopic vein harvest from the both  legs  Disposition: 01-Home or Self Care   Discharge Medications:   Discharge Instructions    Amb Referral to Cardiac Rehabilitation   Complete by:  As directed    Diagnosis:   CABG NSTEMI     CABG X ___:  4     Allergies as of 11/06/2017      Reactions   Aspirin Other (See Comments)   Will flare up gout Will flare up gout   Crestor [rosuvastatin Calcium]    Reported intolerance to Lipitor in the past Crestor 40mg  patient reported muscle aches, dose reduced to rechallenge      Medication List    TAKE these medications   acetaminophen 325 MG tablet Commonly known as:  TYLENOL Take 325 mg every 6 (six) hours as needed by mouth for mild pain.   amiodarone 200 MG tablet Commonly known as:  PACERONE Take 1 tablet (200 mg total) by mouth 2 (two) times daily. For  one weeks;then take Amiodarone 200 mg by mouth daily thereafter.   amoxicillin-clavulanate 875-125 MG tablet Commonly known as:  AUGMENTIN Take 1 tablet by mouth every 12 (twelve) hours. For one week then stop. What changed:    when to take this  additional instructions   aspirin 81 MG EC tablet Take 1 tablet (81 mg total) by mouth daily.   febuxostat 40 MG tablet Commonly known as:  ULORIC Take 40 mg by mouth daily.   hydrocortisone cream 1 % Apply 1 application topically as needed for itching. Do NOT apply to sternal wound What changed:  additional instructions   indomethacin 50 MG capsule Commonly known as:  INDOCIN Take 50 mg daily as needed by mouth for mild pain (gout flare up).   rosuvastatin 10 MG tablet Commonly known as:  CRESTOR Take 1 tablet (10 mg total) by mouth daily at 6 PM.   traMADol 50 MG tablet Commonly known as:  ULTRAM Take 1 tablet (50 mg total) by mouth every 6 (six) hours as needed for moderate pain.   Vitamin D3 5000 units Caps Take 5,000 Units daily by mouth.   warfarin 1 MG tablet Commonly known as:  COUMADIN Take 1 tablet (1 mg total) by mouth daily. Or as  directed.      The patient has been discharged on:   1.Beta Blocker:  Yes [   ]                              No   [ x  ]                              If No, reason:Bradycardia  2.Ace Inhibitor/ARB: Yes [   ]                                     No  [  x  ]                                     If No, reason: labile BP  3.Statin:   Yes [ x  ]                  No  [   ]                  If No, reason:  4.Ecasa:  Yes  [ x  ]                  No   [   ]                  If No, reason:   Follow-up Information    [place], Crista Luria, PA Follow up on 11/15/2017.   Specialty:  Cardiology Why:  Appointment is at 11:00 Contact information: [place] [address] [phone]        [facility] Follow up on 11/28/2017.   Specialty:  Cardiothoracic Surgery Why:  Appointment is at 1:00, please get CXR at 12:30 at [place] located on first floor of our office building Contact information: [address] [place], [place] [phone]  [place], Amy S, PA-C Follow up on 11/22/2017.   Specialty:  Gastroenterology Why:  10:30 AM for follow up of diverticulitis.   Contact information: [place] [address] [phone]        [facility] Follow up.   Specialty:  Cardiology Why:  Coumadin clinic appointment to check PT/INR blood test on Tuesday, December 4 at 10:30 AM. Contact information: [address] [place] [phone]          Signed: Arnoldo Lenis 11/06/2017, 10:44 AM

## 2017-10-29 LAB — TYPE AND SCREEN
ABO/RH(D): A POS
ANTIBODY SCREEN: NEGATIVE
UNIT DIVISION: 0
UNIT DIVISION: 0
UNIT DIVISION: 0
Unit division: 0
Unit division: 0
Unit division: 0

## 2017-10-29 LAB — BASIC METABOLIC PANEL
Anion gap: 7 (ref 5–15)
BUN: 12 mg/dL (ref 6–20)
CALCIUM: 7.9 mg/dL — AB (ref 8.9–10.3)
CHLORIDE: 100 mmol/L — AB (ref 101–111)
CO2: 26 mmol/L (ref 22–32)
CREATININE: 1.21 mg/dL (ref 0.61–1.24)
GFR calc non Af Amer: 59 mL/min — ABNORMAL LOW (ref >60)
Glucose, Bld: 93 mg/dL (ref 65–99)
Potassium: 3.6 mmol/L (ref 3.5–5.1)
SODIUM: 133 mmol/L — AB (ref 135–145)

## 2017-10-29 LAB — BPAM RBC
BLOOD PRODUCT EXPIRATION DATE: 201812052359
BLOOD PRODUCT EXPIRATION DATE: 201812102359
BLOOD PRODUCT EXPIRATION DATE: 201812102359
BLOOD PRODUCT EXPIRATION DATE: 201812102359
Blood Product Expiration Date: 201812062359
Blood Product Expiration Date: 201812102359
ISSUE DATE / TIME: 201811230730
ISSUE DATE / TIME: 201811231037
ISSUE DATE / TIME: 201811211129
ISSUE DATE / TIME: 201811230751
ISSUE DATE / TIME: 201811231020
ISSUE DATE / TIME: 201811231446
UNIT TYPE AND RH: 6200
UNIT TYPE AND RH: 6200
UNIT TYPE AND RH: 6200
Unit Type and Rh: 6200
Unit Type and Rh: 6200
Unit Type and Rh: 6200

## 2017-10-29 LAB — CBC
HCT: 28.3 % — ABNORMAL LOW (ref 39.0–52.0)
HEMOGLOBIN: 9.4 g/dL — AB (ref 13.0–17.0)
MCH: 28.3 pg (ref 26.0–34.0)
MCHC: 33.2 g/dL (ref 30.0–36.0)
MCV: 85.2 fL (ref 78.0–100.0)
Platelets: 133 10*3/uL — ABNORMAL LOW (ref 150–400)
RBC: 3.32 MIL/uL — ABNORMAL LOW (ref 4.22–5.81)
RDW: 16.1 % — AB (ref 11.5–15.5)
WBC: 5.5 10*3/uL (ref 4.0–10.5)

## 2017-10-29 LAB — CULTURE, BLOOD (ROUTINE X 2)
CULTURE: NO GROWTH
Culture: NO GROWTH
SPECIAL REQUESTS: ADEQUATE
SPECIAL REQUESTS: ADEQUATE

## 2017-10-29 MED ORDER — POTASSIUM CHLORIDE CRYS ER 20 MEQ PO TBCR: 20.0000 meq | EXTENDED_RELEASE_TABLET | Freq: Every day | ORAL | Status: DC

## 2017-10-29 MED ORDER — SODIUM CHLORIDE 0.9 % IV SOLN: 250.0000 mL | INTRAVENOUS | Status: DC | PRN

## 2017-10-29 MED ORDER — POTASSIUM CHLORIDE 10 MEQ/50ML IV SOLN
10.0000 meq | INTRAVENOUS | Status: AC
  Administered 2017-10-29 (×3): 10 meq via INTRAVENOUS
  Filled 2017-10-29 (×3): qty 50

## 2017-10-29 MED ORDER — SODIUM CHLORIDE 0.9% FLUSH: 3.0000 mL | INTRAVENOUS | Status: DC | PRN

## 2017-10-29 MED ORDER — SODIUM CHLORIDE 0.9% FLUSH
3.0000 mL | Freq: Two times a day (BID) | INTRAVENOUS | Status: DC
  Administered 2017-10-29 – 2017-11-04 (×9): 3 mL via INTRAVENOUS

## 2017-10-29 MED ORDER — FUROSEMIDE 10 MG/ML IJ SOLN
40.0000 mg | Freq: Once | INTRAMUSCULAR | Status: AC
  Administered 2017-10-29: 40 mg via INTRAVENOUS
  Filled 2017-10-29: qty 4

## 2017-10-29 MED ORDER — MOVING RIGHT ALONG BOOK
Freq: Once | Status: AC
  Administered 2017-10-29: 13:00:00
  Filled 2017-10-29: qty 1

## 2017-10-29 MED ORDER — FUROSEMIDE 40 MG PO TABS
40.0000 mg | ORAL_TABLET | Freq: Every day | ORAL | Status: DC
  Administered 2017-10-30 – 2017-10-31 (×2): 40 mg via ORAL
  Filled 2017-10-29 (×2): qty 1

## 2017-10-29 NOTE — Progress Notes (Signed)
Patient moved to 4E07 via wheelchair. All belongings including cellphone, music device and laptop and all accompanying chargers brought with patient to new room. Patient assisted to recliner. Jeneen Rinks RN at bedside.

## 2017-10-29 NOTE — Plan of Care (Signed)
  Education: Knowledge of General Education information will improve 10/29/2017 2228 - Progressing by Drenda Freeze, RN   Health Behavior/Discharge Planning: Ability to manage health-related needs will improve 10/29/2017 2228 - Progressing by Drenda Freeze, RN

## 2017-10-29 NOTE — Progress Notes (Signed)
      [address]       [place] [address]             [phone]        CARDIOTHORACIC SURGERY PROGRESS NOTE   R3 Days Post-Op Procedure(s) (LRB): CORONARY ARTERY BYPASS GRAFTING (CABG) x four , using left internal mammary artery and bilateral legs greater saphenous vein harvested endoscopically (N/A) TRANSESOPHAGEAL ECHOCARDIOGRAM (TEE) (N/A)  Subjective: Feels better today.  Some soreness in chest.  No SOB.  More steady on his feet.  No abdominal pain.  Poor appetite  Objective: Vital signs: BP Readings from Last 1 Encounters:  10/29/17 117/78   Pulse Readings from Last 1 Encounters:  10/29/17 80   Resp Readings from Last 1 Encounters:  10/29/17 17   Temp Readings from Last 1 Encounters:  10/29/17 (!) 97.5 F (36.4 C) (Oral)    Hemodynamics:    Physical Exam:  Rhythm:   Sinus 60's  Breath sounds: clear  Heart sounds:  RRR  Incisions:  Dressing dry, intact  Abdomen:  Soft, non-distended, non-tender  Extremities:  Warm, well-perfused    Intake/Output from previous day: 11/23 0701 - 11/24 0700 In: 2051.6 [P.O.:240; I.V.:881.6; Blood:630; IV Piggyback:300] Out: 2710 [Urine:2710] Intake/Output this shift: Total I/O In: 20 [I.V.:20] Out: 85 [Urine:85]  Lab Results:  CBC: Recent Labs    10/28/17 0400 10/29/17 0230  WBC 5.8 5.5  HGB 7.7* 9.4*  HCT 23.8* 28.3*  PLT 144* 133*    BMET:  Recent Labs    10/28/17 0400 10/29/17 0230  NA 132* 133*  K 4.4 3.6  CL 101 100*  CO2 25 26  GLUCOSE 116* 93  BUN 12 12  CREATININE 1.22 1.21  CALCIUM 7.9* 7.9*     PT/INR:   Recent Labs    10/26/17 1517  LABPROT 17.6*  INR 1.46    CBG (last 3)  Recent Labs    10/27/17 0337 10/27/17 0836 10/27/17 1941  GLUCAP 111* 109* 122*    ABG    Component Value Date/Time   PHART 7.414 10/26/2017 2330   PCO2ART 36.4 10/26/2017 2330   PO2ART 132.0 (H) 10/26/2017 2330   HCO3 23.3 10/26/2017 2330   TCO2 23 10/27/2017 1622   ACIDBASEDEF 1.0 10/26/2017 2330   O2SAT 99.0 10/26/2017 2330    CXR: n/a  Assessment/Plan: S/P Procedure(s) (LRB): CORONARY ARTERY BYPASS GRAFTING (CABG) x four , using left internal mammary artery and bilateral legs greater saphenous vein harvested endoscopically (N/A) TRANSESOPHAGEAL ECHOCARDIOGRAM (TEE) (N/A)  Clinically stable POD3 Maintaining NSR w/ stable BP off Neo Breathing comfortably w/ O2 sats 97-98% on RA Expected post op volume excess, weight reportedly close to preop Expected post op acute blood loss anemia, improved after transfusion Chronic diverticulitis w/ abscess - stable   Mobilize  Diuresis  Continue IV Zosyn  Transfer 4E    Rexene Alberts, MD 10/29/2017 9:19 AM

## 2017-10-29 NOTE — Progress Notes (Signed)
[name] MD paged regarding patient intermittently going into A Fib with rates in the 60s, causing a loss of atrial capture with epicardial pacer, resulting in MAPs in the 60s.  [name] MD doesn't want patient pacing VVI as he feels this is contributing to his hypotension and need for low-dose Neo.   Patient with history of A Fib transitioned today from IV Amiodarone to PO.   [name] MD instructions to increase Neosynephrine gtt to maintain MAPs > 70 if needed and not to ventricularly pace or restart Amiodarone gtt.  Will carry out orders.   [name]

## 2017-10-29 NOTE — Anesthesia Postprocedure Evaluation (Signed)
Anesthesia Post Note  Patient: Dean [name]  Procedure(s) Performed: CORONARY ARTERY BYPASS GRAFTING (CABG) x four , using left internal mammary artery and bilateral legs greater saphenous vein harvested endoscopically (N/A Chest) TRANSESOPHAGEAL ECHOCARDIOGRAM (TEE) (N/A )     Patient location during evaluation: ICU Anesthesia Type: General Level of consciousness: sedated Pain management: pain level controlled Vital Signs Assessment: post-procedure vital signs reviewed and stable Respiratory status: spontaneous breathing and respiratory function stable Cardiovascular status: stable Postop Assessment: no apparent nausea or vomiting Anesthetic complications: no Comments: Patient doing well post-CABG, extubated uneventfully on POD#0    Last Vitals:  Vitals:   10/29/17 0800 10/29/17 0900  BP: 117/78 111/79  Pulse: 80 80  Resp: 17 15  Temp:  36.5 C  SpO2: 97% 98%    Last Pain:  Vitals:   10/29/17 0530  TempSrc:   PainSc: [place]

## 2017-10-30 ENCOUNTER — Inpatient Hospital Stay (HOSPITAL_COMMUNITY): Payer: Medicare Other

## 2017-10-30 LAB — BASIC METABOLIC PANEL
ANION GAP: 8 (ref 5–15)
BUN: 13 mg/dL (ref 6–20)
CO2: 29 mmol/L (ref 22–32)
Calcium: 8.2 mg/dL — ABNORMAL LOW (ref 8.9–10.3)
Chloride: 96 mmol/L — ABNORMAL LOW (ref 101–111)
Creatinine, Ser: 1.39 mg/dL — ABNORMAL HIGH (ref 0.61–1.24)
GFR, EST AFRICAN AMERICAN: 58 mL/min — AB (ref >60)
GFR, EST NON AFRICAN AMERICAN: 50 mL/min — AB (ref >60)
Glucose, Bld: 101 mg/dL — ABNORMAL HIGH (ref 65–99)
POTASSIUM: 3.4 mmol/L — AB (ref 3.5–5.1)
SODIUM: 133 mmol/L — AB (ref 135–145)

## 2017-10-30 LAB — CBC
HCT: 29.8 % — ABNORMAL LOW (ref 39.0–52.0)
Hemoglobin: 9.4 g/dL — ABNORMAL LOW (ref 13.0–17.0)
MCH: 27.3 pg (ref 26.0–34.0)
MCHC: 31.5 g/dL (ref 30.0–36.0)
MCV: 86.6 fL (ref 78.0–100.0)
PLATELETS: 152 10*3/uL (ref 150–400)
RBC: 3.44 MIL/uL — AB (ref 4.22–5.81)
RDW: 16.4 % — ABNORMAL HIGH (ref 11.5–15.5)
WBC: 4.8 10*3/uL (ref 4.0–10.5)

## 2017-10-30 MED ORDER — POTASSIUM CHLORIDE CRYS ER 20 MEQ PO TBCR
20.0000 meq | EXTENDED_RELEASE_TABLET | Freq: Two times a day (BID) | ORAL | Status: AC
  Administered 2017-10-30 (×2): 20 meq via ORAL
  Filled 2017-10-30 (×2): qty 1

## 2017-10-30 NOTE — Progress Notes (Addendum)
[address]       Dean [name],Dean [name] [address]             [phone]      4 Days Post-Op Procedure(s) (LRB): CORONARY ARTERY BYPASS GRAFTING (CABG) x four , using left internal mammary artery and bilateral legs greater saphenous vein harvested endoscopically (N/A) TRANSESOPHAGEAL ECHOCARDIOGRAM (TEE) (N/A) Subjective: conts to progress nicely, mild discomforts  Objective: Vital signs in last 24 hours: Temp:  [97.6 F (36.4 C)-97.8 F (36.6 C)] 97.7 F (36.5 C) (11/25 0545) Pulse Rate:  [58-80] 62 (11/24 2125) Cardiac Rhythm: Other (Comment) (11/25 0700) Resp:  [14-21] 21 (11/24 2125) BP: (108-130)/(67-79) 130/74 (11/24 2125) SpO2:  [96 %-99 %] 96 % (11/24 2125) Arterial Line BP: (129-133)/(62-66) 133/66 (11/24 0900) Weight:  [160 lb 6.4 oz (72.8 kg)] 160 lb 6.4 oz (72.8 kg) (11/25 0545)  Hemodynamic parameters for last 24 hours:    Intake/Output from previous day: 11/24 0701 - 11/25 0700 In: 479.3 [P.O.:360; I.V.:69.3; IV Piggyback:50] Out: 1335 [Urine:1335] Intake/Output this shift: No intake/output data recorded.  General appearance: alert, cooperative and no distress Heart: regular rate and rhythm Lungs: clear to auscultation bilaterally Abdomen: soft, nontender, nondistended, + BS Extremities: + LE edema Wound: incis healing well  Lab Results: Recent Labs    10/29/17 0230 10/30/17 0211  WBC 5.5 4.8  HGB 9.4* 9.4*  HCT 28.3* 29.8*  PLT 133* 152   BMET:  Recent Labs    10/29/17 0230 10/30/17 0211  NA 133* 133*  K 3.6 3.4*  CL 100* 96*  CO2 26 29  GLUCOSE 93 101*  BUN 12 13  CREATININE 1.21 1.39*  CALCIUM 7.9* 8.2*    PT/INR: No results for input(s): LABPROT, INR in the last 72 hours. ABG    Component Value Date/Time   PHART 7.414 10/26/2017 2330   HCO3 23.3 10/26/2017 2330   TCO2 23 10/27/2017 1622   ACIDBASEDEF 1.0 10/26/2017 2330   O2SAT 99.0 10/26/2017 2330   CBG (last 3)  Recent Labs    10/27/17 0836  10/27/17 1941  GLUCAP 109* 122*    Meds Scheduled Meds: . acetaminophen  1,000 mg Oral Q6H  . amiodarone  400 mg Oral BID  . aspirin EC  81 mg Oral Daily  . bisacodyl  10 mg Oral Daily   Or  . bisacodyl  10 mg Rectal Daily  . docusate sodium  200 mg Oral Daily  . febuxostat  40 mg Oral Daily  . furosemide  40 mg Oral Daily  . pantoprazole  40 mg Oral Daily  . potassium chloride  20 mEq Oral Daily  . rosuvastatin  40 mg Oral q1800  . sodium chloride flush  3 mL Intravenous Q12H  . sodium chloride flush  3 mL Intravenous Q12H   Continuous Infusions: . sodium chloride    . sodium chloride    . sodium chloride    . sodium chloride    . piperacillin-tazobactam (ZOSYN)  IV 3.375 g (10/30/17 0612)   PRN Meds:.sodium chloride, hydrocortisone, metoprolol tartrate, morphine injection, ondansetron (ZOFRAN) IV, oxyCODONE, sodium chloride flush, sodium chloride flush, traMADol  Xrays No results found.  Assessment/Plan: S/P Procedure(s) (LRB): CORONARY ARTERY BYPASS GRAFTING (CABG) x four , using left internal mammary artery and bilateral legs greater saphenous vein harvested endoscopically (N/A) TRANSESOPHAGEAL ECHOCARDIOGRAM (TEE) (N/A)  1 hemodyn stable, + PVC's with bigemminy, some bradycardia, no beta blocker for now, check 12 lead to determine QTc- currently on  amio 400 BID. Replace K+, most recent MG++ 2.4- will recheck. TSH elevated on 11/20 at 5.811- will check free T4 and T3.- may need to add synthroid if not euthyroid sick syndrome.  2 BP is adeq control- Creat increased a little- no ACE/ARB for now, should be able to stop diuretic soon, recheck bmet in am 3 sugars adeq controlled. Needs good diet management  4 on zosyn for diverticulitis- clinically stable abd exam, no leukocytosis or fevers 5 H/H is stable 6 cont routine rehab and pulm toilet  LOS: 6 days    [name] 10/30/2017  I have seen and examined the patient and agree with the assessment and plan as  outlined.  Rexene Alberts, MD 10/30/2017 10:17 AM

## 2017-10-30 NOTE — Progress Notes (Signed)
Pt refused 2nd round of labs this am, Jadene Pierini PA was notified and an order to change the lab draw to tomorrow was placed.

## 2017-10-30 NOTE — Plan of Care (Signed)
  Education: Knowledge of General Education information will improve 10/30/2017 2323 - Progressing by Drenda Freeze, RN   Health Behavior/Discharge Planning: Ability to manage health-related needs will improve 10/30/2017 2323 - Progressing by Drenda Freeze, RN

## 2017-10-31 ENCOUNTER — Ambulatory Visit: Payer: Medicare Other | Admitting: Physician Assistant

## 2017-10-31 DIAGNOSIS — Z951 Presence of aortocoronary bypass graft: Secondary | ICD-10-CM

## 2017-10-31 LAB — BASIC METABOLIC PANEL
Anion gap: 8 (ref 5–15)
BUN: 12 mg/dL (ref 6–20)
CALCIUM: 8.4 mg/dL — AB (ref 8.9–10.3)
CHLORIDE: 99 mmol/L — AB (ref 101–111)
CO2: 29 mmol/L (ref 22–32)
Creatinine, Ser: 1.46 mg/dL — ABNORMAL HIGH (ref 0.61–1.24)
GFR, EST AFRICAN AMERICAN: 55 mL/min — AB (ref >60)
GFR, EST NON AFRICAN AMERICAN: 47 mL/min — AB (ref >60)
GLUCOSE: 92 mg/dL (ref 65–99)
Potassium: 3.7 mmol/L (ref 3.5–5.1)
Sodium: 136 mmol/L (ref 135–145)

## 2017-10-31 LAB — T4, FREE: Free T4: 1.2 ng/dL — ABNORMAL HIGH (ref 0.61–1.12)

## 2017-10-31 LAB — MAGNESIUM: Magnesium: 1.7 mg/dL (ref 1.7–2.4)

## 2017-10-31 MED FILL — Magnesium Sulfate Inj 50%: INTRAMUSCULAR | Qty: 10 | Status: AC

## 2017-10-31 MED FILL — Electrolyte-R (PH 7.4) Solution: INTRAVENOUS | Qty: 3000 | Status: AC

## 2017-10-31 MED FILL — Heparin Sodium (Porcine) Inj 1000 Unit/ML: INTRAMUSCULAR | Qty: 30 | Status: AC

## 2017-10-31 MED FILL — Heparin Sodium (Porcine) Inj 1000 Unit/ML: INTRAMUSCULAR | Qty: 20 | Status: AC

## 2017-10-31 MED FILL — Mannitol IV Soln 20%: INTRAVENOUS | Qty: 500 | Status: AC

## 2017-10-31 MED FILL — Sodium Bicarbonate IV Soln 8.4%: INTRAVENOUS | Qty: 50 | Status: AC

## 2017-10-31 MED FILL — Lidocaine HCl IV Inj 20 MG/ML: INTRAVENOUS | Qty: 10 | Status: AC

## 2017-10-31 MED FILL — Sodium Chloride IV Soln 0.9%: INTRAVENOUS | Qty: 2000 | Status: AC

## 2017-10-31 MED FILL — Potassium Chloride Inj 2 mEq/ML: INTRAVENOUS | Qty: 20 | Status: AC

## 2017-10-31 NOTE — Progress Notes (Signed)
Progress Note  Patient Name: Dean [name] Date of Encounter: 10/31/2017  Primary Cardiologist: Nahser   Subjective   Dean [name] is a 71 year old gentleman with recent diagnosis of coronary artery disease.  Is now status post coronary artery bypass grafting.  He seems to be doing well.  He had diverticulitis with diverticular perforations prior to admission.  This seems to be stable.  He ambulated with cardiac rehab today without any significant problems.  Feels quite a bit better.  Inpatient Medications    Scheduled Meds: . acetaminophen  1,000 mg Oral Q6H  . amiodarone  400 mg Oral BID  . aspirin EC  81 mg Oral Daily  . bisacodyl  10 mg Oral Daily   Or  . bisacodyl  10 mg Rectal Daily  . docusate sodium  200 mg Oral Daily  . febuxostat  40 mg Oral Daily  . furosemide  40 mg Oral Daily  . pantoprazole  40 mg Oral Daily  . rosuvastatin  40 mg Oral q1800  . sodium chloride flush  3 mL Intravenous Q12H  . sodium chloride flush  3 mL Intravenous Q12H   Continuous Infusions: . sodium chloride    . sodium chloride    . sodium chloride    . sodium chloride    . piperacillin-tazobactam (ZOSYN)  IV 3.375 g (10/31/17 0629)   PRN Meds: sodium chloride, hydrocortisone, metoprolol tartrate, morphine injection, ondansetron (ZOFRAN) IV, oxyCODONE, sodium chloride flush, sodium chloride flush, traMADol   Vital Signs    Vitals:   10/30/17 0801 10/30/17 1613 10/30/17 2143 10/31/17 0555  BP: 117/72 122/71 129/73 125/77  Pulse: 62     Resp: 20 (!) 21 16 19   Temp: 97.9 F (36.6 C) 97.8 F (36.6 C) 98 F (36.7 C) 97.6 F (36.4 C)  TempSrc: Oral Oral Oral Oral  SpO2: 96%  98% 100%  Weight:    158 lb 12.8 oz (72 kg)  Height:        Intake/Output Summary (Last 24 hours) at 10/31/2017 1155 Last data filed at 10/31/2017 0800 Gross per 24 hour  Intake 840 ml  Output 400 ml  Net 440 ml   Filed Weights   10/29/17 0630 10/30/17 0545 10/31/17 0555  Weight: 159 lb 13.3 oz (72.5  kg) 160 lb 6.4 oz (72.8 kg) 158 lb 12.8 oz (72 kg)    Telemetry    NSR f - Personally Reviewed  ECG     NSR  - Personally Reviewed  Physical Exam   GEN: No acute distress.   Neck: No JVD Cardiac: RRR, no murmurs, rubs, or gallops.  Sternal wound is healing well  Respiratory: Clear to auscultation bilaterally. GI: Soft, nontender, non-distended  MS: No edema; No deformity. Neuro:  Nonfocal  Psych: Normal affect   Labs    Chemistry Recent Labs  Lab 10/29/17 0230 10/30/17 0211 10/31/17 0113  NA 133* 133* 136  K 3.6 3.4* 3.7  CL 100* 96* 99*  CO2 26 29 29   GLUCOSE 93 101* 92  BUN 12 13 12   CREATININE 1.21 1.39* 1.46*  CALCIUM 7.9* 8.2* 8.4*  GFRNONAA 59* 50* 47*  GFRAA >60 58* 55*  ANIONGAP 7 8 8      Hematology Recent Labs  Lab 10/28/17 0400 10/29/17 0230 10/30/17 0211  WBC 5.8 5.5 4.8  RBC 2.76* 3.32* 3.44*  HGB 7.7* 9.4* 9.4*  HCT 23.8* 28.3* 29.8*  MCV 86.2 85.2 86.6  MCH 27.9 28.3 27.3  MCHC 32.4 33.2 31.5  RDW  17.4* 16.1* 16.4*  PLT 144* 133* 152    Cardiac Enzymes Recent Labs  Lab 10/24/17 1611 10/24/17 2021 10/25/17 0341  TROPONINI 5.46* 12.27* 9.05*    Recent Labs  Lab 10/24/17 1323  TROPIPOC 5.09*     BNPNo results for input(s): BNP, PROBNP in the last 168 hours.   DDimer No results for input(s): DDIMER in the last 168 hours.   Radiology    Dg Chest 2 View  Result Date: 10/30/2017 CLINICAL DATA:  Atelectasis.  Chest pain. EXAM: CHEST  2 VIEW COMPARISON:  10/28/2017 FINDINGS: Sequelae of CABG are again identified. The right jugular sheath has been removed. The cardiomediastinal silhouette is unchanged. There is mild elevation of the right hemidiaphragm. Small bilateral pleural effusions are present. No airspace consolidation, edema, or pneumothorax is identified. No acute osseous abnormality is seen. IMPRESSION: Small bilateral pleural effusions.  Clear lungs. Electronically Signed   By: Logan Bores M.D.   On: 10/30/2017 12:59     Cardiac Studies     Patient Profile     71 y.o. male with CAD   Assessment & Plan    1.  Coronary artery disease: He status post coronary artery bypass grafting.  He seems to be doing well.  Continue current medications.  He is ambulating without difficulty.  2.  Anemia: His hemoglobin is gradually climbing.  3.  Chronic kidney disease: Creatinine today is 1.46.  We will continue to follow.  For questions or updates, please contact [place] Please consult [email or web address] for contact info under Cardiology/STEMI.      Signed, Mertie Moores, MD  10/31/2017, 11:55 AM   d

## 2017-10-31 NOTE — Care Management Important Message (Signed)
Important Message  Patient Details  Name: Dean [name] MRN: [id number] Date of Birth: 05/19/1946   Medicare Important Message Given:  Yes    [name] Abena 10/31/2017, 10:02 AM

## 2017-10-31 NOTE — Progress Notes (Signed)
CARDIAC REHAB PHASE I   PRE:  Rate/Rhythm: 74 SR    BP: sitting 131/50    SaO2: 94 RA  MODE:  Ambulation: 470 ft   POST:  Rate/Rhythm: 101 ST    BP: sitting 149/63     SaO2: 97 RA  Pt wanted to walk without RW. Fairly steady, had standby assist. No c/o walking, talked entire walk. Return to recliner for lunch. Encouraged x2 more walks. Gas, ACSM 10/31/2017 11:50 AM

## 2017-10-31 NOTE — Progress Notes (Addendum)
[address]       [place] [address]             [phone]      5 Days Post-Op Procedure(s) (LRB): CORONARY ARTERY BYPASS GRAFTING (CABG) x four , using left internal mammary artery and bilateral legs greater saphenous vein harvested endoscopically (N/A) TRANSESOPHAGEAL ECHOCARDIOGRAM (TEE) (N/A) Subjective: Feels like he did a little too much yesterday so he is still in bed.   Objective: Vital signs in last 24 hours: Temp:  [97.6 F (36.4 C)-98 F (36.7 C)] 97.6 F (36.4 C) (11/26 0555) Pulse Rate:  [62] 62 (11/25 0801) Cardiac Rhythm: Atrial fibrillation (11/26 0700) Resp:  [16-21] 19 (11/26 0555) BP: (117-129)/(71-77) 125/77 (11/26 0555) SpO2:  [96 %-100 %] 100 % (11/26 0555) Weight:  [158 lb 12.8 oz (72 kg)] 158 lb 12.8 oz (72 kg) (11/26 0555)     Intake/Output from previous day: 11/25 0701 - 11/26 0700 In: 1080 [P.O.:1080] Out: 600 [Urine:600] Intake/Output this shift: No intake/output data recorded.  General appearance: alert, cooperative and no distress Heart: irregularly irregular rhythm Lungs: clear to auscultation bilaterally Abdomen: soft, non-tender; bowel sounds normal; no masses,  no organomegaly Extremities: bilaterally pedal edema L > R with ecchymosis Wound: clean and dry  Lab Results: Recent Labs    10/29/17 0230 10/30/17 0211  WBC 5.5 4.8  HGB 9.4* 9.4*  HCT 28.3* 29.8*  PLT 133* 152   BMET:  Recent Labs    10/30/17 0211 10/31/17 0113  NA 133* 136  K 3.4* 3.7  CL 96* 99*  CO2 29 29  GLUCOSE 101* 92  BUN 13 12  CREATININE 1.39* 1.46*  CALCIUM 8.2* 8.4*    PT/INR: No results for input(s): LABPROT, INR in the last 72 hours. ABG    Component Value Date/Time   PHART 7.414 10/26/2017 2330   HCO3 23.3 10/26/2017 2330   TCO2 23 10/27/2017 1622   ACIDBASEDEF 1.0 10/26/2017 2330   O2SAT 99.0 10/26/2017 2330   CBG (last 3)  No results for input(s): GLUCAP in the last 72 hours.  Assessment/Plan: S/P  Procedure(s) (LRB): CORONARY ARTERY BYPASS GRAFTING (CABG) x four , using left internal mammary artery and bilateral legs greater saphenous vein harvested endoscopically (N/A) TRANSESOPHAGEAL ECHOCARDIOGRAM (TEE) (N/A)  1 hemodyn stable,in rate controlled atrial fibrillation  With occasional PVC currently on amio 400 BID. TSH elevated on 11/20 at 5.811- free T4 is elevated. 2 BP is well control- Creat increased a little- no ACE/ARB for now, should be able to stop diuretic soon, weight trending down and is now below baseline weight.   3 sugars adeq controlled. Needs good diet management  4 on zosyn for diverticulitis- clinically stable abd exam, no leukocytosis or fevers 5 H/H is stable 6 cont routine rehab and pulm toilet  Plan: remains on IV Zosyn for diverticulitis. EPW remain due to arrhthymias. Ambulate in the halls today. Work on increasing oral intake. Work on Chiropodist.    LOS: 7 days    Dean [name] 10/31/2017   Chart reviewed, patient examined, agree with above. His rhythm is sinus this afternoon with frequent premature beats. This should quiet down as he gets further out from surgery. I would continue amio for now and add Lopressor as BP and HR allow. I would not start anticoagulation in this patient with his current GI issue. Weight is below preop so will stop diuretic. He is eating and bowels working ok. Need to  check with GI medicine to see if they want to continue antibiotics as outpt.  He lives alone and has no one to stay with him so will need to go to SNF. Ordered SW consult.

## 2017-11-01 LAB — T3: T3 TOTAL: 71 ng/dL (ref 71–180)

## 2017-11-01 MED ORDER — WARFARIN SODIUM 5 MG PO TABS
5.0000 mg | ORAL_TABLET | Freq: Every day | ORAL | Status: DC
  Administered 2017-11-01 – 2017-11-02 (×2): 5 mg via ORAL
  Filled 2017-11-01 (×2): qty 1

## 2017-11-01 MED ORDER — WARFARIN - PHYSICIAN DOSING INPATIENT
Freq: Every day | Status: DC
Start: 2017-11-01 — End: 2017-11-06
  Administered 2017-11-02 – 2017-11-03 (×2)

## 2017-11-01 MED ORDER — ACETAMINOPHEN 325 MG PO TABS
650.0000 mg | ORAL_TABLET | Freq: Four times a day (QID) | ORAL | Status: DC | PRN
  Administered 2017-11-01 – 2017-11-06 (×12): 650 mg via ORAL
  Filled 2017-11-01 (×11): qty 2

## 2017-11-01 MED ORDER — COUMADIN BOOK
Freq: Once | Status: AC
  Administered 2017-11-01: 18:00:00
  Filled 2017-11-01: qty 1

## 2017-11-01 MED ORDER — METOPROLOL TARTRATE 12.5 MG HALF TABLET
12.5000 mg | ORAL_TABLET | Freq: Two times a day (BID) | ORAL | Status: DC
  Administered 2017-11-01 (×2): 12.5 mg via ORAL
  Filled 2017-11-01 (×2): qty 1

## 2017-11-01 NOTE — Progress Notes (Signed)
Progress Note  Patient Name: Dean [name] Date of Encounter: 11/01/2017  Primary Cardiologist: Eboney Claybrook   Subjective   71 year old gentleman with a history of coronary artery disease and status post recent coronary artery bypass grafting.  Done well.  He has had some postoperative atrial fibrillation. Amiodarone has been started.  Lopressor was added today.  He is also starting on Coumadin.  Inpatient Medications    Scheduled Meds: . amiodarone  400 mg Oral BID  . aspirin EC  81 mg Oral Daily  . bisacodyl  10 mg Oral Daily   Or  . bisacodyl  10 mg Rectal Daily  . docusate sodium  200 mg Oral Daily  . febuxostat  40 mg Oral Daily  . metoprolol tartrate  12.5 mg Oral BID  . pantoprazole  40 mg Oral Daily  . rosuvastatin  40 mg Oral q1800  . sodium chloride flush  3 mL Intravenous Q12H  . sodium chloride flush  3 mL Intravenous Q12H  . warfarin  5 mg Oral q1800  . Warfarin - Physician Dosing Inpatient   Does not apply q1800   Continuous Infusions: . sodium chloride    . sodium chloride    . sodium chloride    . sodium chloride    . piperacillin-tazobactam (ZOSYN)  IV Stopped (11/01/17 0939)   PRN Meds: sodium chloride, hydrocortisone, metoprolol tartrate, morphine injection, ondansetron (ZOFRAN) IV, oxyCODONE, sodium chloride flush, sodium chloride flush, traMADol   Vital Signs    Vitals:   10/31/17 1403 10/31/17 2016 11/01/17 0435 11/01/17 1018  BP: 111/69     Pulse: 76 67 75 73  Resp: (!) 23     Temp: 98.6 F (37 C) 97.8 F (36.6 C) 98.6 F (37 C)   TempSrc: Oral Oral Oral   SpO2: 91% 96% 98%   Weight:   158 lb 4.8 oz (71.8 kg)   Height:        Intake/Output Summary (Last 24 hours) at 11/01/2017 1028 Last data filed at 11/01/2017 0939 Gross per 24 hour  Intake 560 ml  Output -  Net 560 ml   Filed Weights   10/30/17 0545 10/31/17 0555 11/01/17 0435  Weight: 160 lb 6.4 oz (72.8 kg) 158 lb 12.8 oz (72 kg) 158 lb 4.8 oz (71.8 kg)    Telemetry      NSR currently .   Had an episode of PAF overnight  - Personally Reviewed  ECG    NSR  - Personally Reviewed  Physical Exam   GEN: No acute distress.   Neck: No JVD Cardiac: RRR, no murmurs, rubs, or gallops.  Incision is healing well  Respiratory: Clear to auscultation bilaterally. GI: Soft, nontender, non-distended  MS: trace leg edema  Neuro:  Nonfocal  Psych: Normal affect   Labs    Chemistry Recent Labs  Lab 10/29/17 0230 10/30/17 0211 10/31/17 0113  NA 133* 133* 136  K 3.6 3.4* 3.7  CL 100* 96* 99*  CO2 26 29 29   GLUCOSE 93 101* 92  BUN 12 13 12   CREATININE 1.21 1.39* 1.46*  CALCIUM 7.9* 8.2* 8.4*  GFRNONAA 59* 50* 47*  GFRAA >60 58* 55*  ANIONGAP 7 8 8      Hematology Recent Labs  Lab 10/28/17 0400 10/29/17 0230 10/30/17 0211  WBC 5.8 5.5 4.8  RBC 2.76* 3.32* 3.44*  HGB 7.7* 9.4* 9.4*  HCT 23.8* 28.3* 29.8*  MCV 86.2 85.2 86.6  MCH 27.9 28.3 27.3  MCHC 32.4 33.2 31.5  RDW 17.4* 16.1* 16.4*  PLT 144* 133* 152    Cardiac EnzymesNo results for input(s): TROPONINI in the last 168 hours. No results for input(s): TROPIPOC in the last 168 hours.   BNPNo results for input(s): BNP, PROBNP in the last 168 hours.   DDimer No results for input(s): DDIMER in the last 168 hours.   Radiology    Dg Chest 2 View  Result Date: 10/30/2017 CLINICAL DATA:  Atelectasis.  Chest pain. EXAM: CHEST  2 VIEW COMPARISON:  10/28/2017 FINDINGS: Sequelae of CABG are again identified. The right jugular sheath has been removed. The cardiomediastinal silhouette is unchanged. There is mild elevation of the right hemidiaphragm. Small bilateral pleural effusions are present. No airspace consolidation, edema, or pneumothorax is identified. No acute osseous abnormality is seen. IMPRESSION: Small bilateral pleural effusions.  Clear lungs. Electronically Signed   By: Logan Bores M.D.   On: 10/30/2017 12:59    Cardiac Studies      Patient Profile     71 y.o. male with CAD    Assessment & Plan    1.  Coronary artery disease-status post CABG.  Seems to be doing well.  Continue current medications  2.  Paroxysmal atrial fibrillation:  CHADS2VASC is  2 ( age 60, CAD,) Agree with coumadin for now I would anticipate being able to DC the amio and coumadin in 3-6 months depending on how he does    For questions or updates, please contact [place] Please consult [email or web address] for contact info under Cardiology/STEMI.      Signed, Mertie Moores, MD  11/01/2017, 10:28 AM

## 2017-11-01 NOTE — Progress Notes (Signed)
Pt just back from walking with NT long distance. Felt well, sts he could not feel any Afib. Discussed ed with pt. Gave [employer] the [employer] book and videos for further education. He is eager to eventually do [facility] at [facility]. He lives on the third floor of his building without an Media planner. Needs PT c/s to evaluate for SNF.  [phone] Dean [name] CES, ACSM 10:26 AM 11/01/2017

## 2017-11-01 NOTE — Plan of Care (Signed)
Ambulate with minimal assistance

## 2017-11-01 NOTE — Progress Notes (Addendum)
[address]       [place] [address]             [phone]      6 Days Post-Op Procedure(s) (LRB): CORONARY ARTERY BYPASS GRAFTING (CABG) x four , using left internal mammary artery and bilateral legs greater saphenous vein harvested endoscopically (N/A) TRANSESOPHAGEAL ECHOCARDIOGRAM (TEE) (N/A) Subjective: Overall conts to feel better each day  Objective: Vital signs in last 24 hours: Temp:  [97.8 F (36.6 C)-98.6 F (37 C)] 98.6 F (37 C) (11/27 0435) Pulse Rate:  [67-76] 75 (11/27 0435) Cardiac Rhythm: Atrial fibrillation;Atrial flutter (11/27 0700) Resp:  [23] 23 (11/26 1403) BP: (111)/(69) 111/69 (11/26 1403) SpO2:  [91 %-98 %] 98 % (11/27 0435) Weight:  [158 lb 4.8 oz (71.8 kg)] 158 lb 4.8 oz (71.8 kg) (11/27 0435)  Hemodynamic parameters for last 24 hours:    Intake/Output from previous day: 11/26 0701 - 11/27 0700 In: 750 [P.O.:600; I.V.:50; IV Piggyback:100] Out: -  Intake/Output this shift: No intake/output data recorded.  General appearance: alert, cooperative and no distress Heart: irregularly irregular rhythm Lungs: clear to auscultation bilaterally Abdomen: benign Extremities: minor edema Wound: incis healing well  Lab Results: Recent Labs    10/30/17 0211  WBC 4.8  HGB 9.4*  HCT 29.8*  PLT 152   BMET:  Recent Labs    10/30/17 0211 10/31/17 0113  NA 133* 136  K 3.4* 3.7  CL 96* 99*  CO2 29 29  GLUCOSE 101* 92  BUN 13 12  CREATININE 1.39* 1.46*  CALCIUM 8.2* 8.4*    PT/INR: No results for input(s): LABPROT, INR in the last 72 hours. ABG    Component Value Date/Time   PHART 7.414 10/26/2017 2330   HCO3 23.3 10/26/2017 2330   TCO2 23 10/27/2017 1622   ACIDBASEDEF 1.0 10/26/2017 2330   O2SAT 99.0 10/26/2017 2330   CBG (last 3)  No results for input(s): GLUCAP in the last 72 hours.  Meds Scheduled Meds: . amiodarone  400 mg Oral BID  . aspirin EC  81 mg Oral Daily  . bisacodyl  10 mg Oral Daily   Or  . bisacodyl  10 mg Rectal Daily  . docusate sodium  200 mg Oral Daily  . febuxostat  40 mg Oral Daily  . pantoprazole  40 mg Oral Daily  . rosuvastatin  40 mg Oral q1800  . sodium chloride flush  3 mL Intravenous Q12H  . sodium chloride flush  3 mL Intravenous Q12H   Continuous Infusions: . sodium chloride    . sodium chloride    . sodium chloride    . sodium chloride    . piperacillin-tazobactam (ZOSYN)  IV 3.375 g (11/01/17 0539)   PRN Meds:.sodium chloride, hydrocortisone, metoprolol tartrate, morphine injection, ondansetron (ZOFRAN) IV, oxyCODONE, sodium chloride flush, sodium chloride flush, traMADol  Xrays Dg Chest 2 View  Result Date: 10/30/2017 CLINICAL DATA:  Atelectasis.  Chest pain. EXAM: CHEST  2 VIEW COMPARISON:  10/28/2017 FINDINGS: Sequelae of CABG are again identified. The right jugular sheath has been removed. The cardiomediastinal silhouette is unchanged. There is mild elevation of the right hemidiaphragm. Small bilateral pleural effusions are present. No airspace consolidation, edema, or pneumothorax is identified. No acute osseous abnormality is seen. IMPRESSION: Small bilateral pleural effusions.  Clear lungs. Electronically Signed   By: Logan Bores M.D.   On: 10/30/2017 12:59      Assessment/Plan: S/P Procedure(s) (LRB): CORONARY ARTERY BYPASS GRAFTING (  CABG) x four , using left internal mammary artery and bilateral legs greater saphenous vein harvested endoscopically (N/A) TRANSESOPHAGEAL ECHOCARDIOGRAM (TEE) (N/A)   1 hemodyn stable, afib is rapid at times, + PVC's- will add 12.5 of lopressor 2 he will require follow up of thyroid function but does not appear to need supplementation in the acute illness/surgical stress setting 3 no new labs today 4 lasix stopped yesterday- will repeat creat in am 5 will check with GI on abx/diverticulitis management 6 SNF at discharge- SW is working on arrangements  LOS: 8 days    Dean [name] 11/01/2017    Chart reviewed, patient examined, agree with above. He continues to have episodes of atrial fibrillation on amiodarone. Lopressor added today. I think it is best to start Coumadin and keep INR 2-2.5 to decrease risk of stroke. He has never had any bleeding from his diverticulitis and seems stable from GI standpoint at this time.

## 2017-11-02 LAB — BASIC METABOLIC PANEL
ANION GAP: 9 (ref 5–15)
BUN: 14 mg/dL (ref 6–20)
CALCIUM: 8.5 mg/dL — AB (ref 8.9–10.3)
CO2: 29 mmol/L (ref 22–32)
Chloride: 97 mmol/L — ABNORMAL LOW (ref 101–111)
Creatinine, Ser: 1.43 mg/dL — ABNORMAL HIGH (ref 0.61–1.24)
GFR calc non Af Amer: 48 mL/min — ABNORMAL LOW (ref >60)
GFR, EST AFRICAN AMERICAN: 56 mL/min — AB (ref >60)
Glucose, Bld: 114 mg/dL — ABNORMAL HIGH (ref 65–99)
POTASSIUM: 3.1 mmol/L — AB (ref 3.5–5.1)
Sodium: 135 mmol/L (ref 135–145)

## 2017-11-02 LAB — CBC
HEMATOCRIT: 31.6 % — AB (ref 39.0–52.0)
HEMOGLOBIN: 9.9 g/dL — AB (ref 13.0–17.0)
MCH: 27.5 pg (ref 26.0–34.0)
MCHC: 31.3 g/dL (ref 30.0–36.0)
MCV: 87.8 fL (ref 78.0–100.0)
Platelets: 217 10*3/uL (ref 150–400)
RBC: 3.6 MIL/uL — AB (ref 4.22–5.81)
RDW: 16.7 % — ABNORMAL HIGH (ref 11.5–15.5)
WBC: 7 10*3/uL (ref 4.0–10.5)

## 2017-11-02 LAB — PROTIME-INR
INR: 1.19
Prothrombin Time: 15 seconds (ref 11.4–15.2)

## 2017-11-02 MED ORDER — POTASSIUM CHLORIDE CRYS ER 20 MEQ PO TBCR
20.0000 meq | EXTENDED_RELEASE_TABLET | Freq: Two times a day (BID) | ORAL | Status: DC
  Administered 2017-11-02 – 2017-11-06 (×9): 20 meq via ORAL
  Filled 2017-11-02 (×9): qty 1

## 2017-11-02 MED ORDER — AMIODARONE HCL 200 MG PO TABS
200.0000 mg | ORAL_TABLET | Freq: Two times a day (BID) | ORAL | Status: DC
Start: 2017-11-02 — End: 2017-11-06
  Administered 2017-11-02 – 2017-11-06 (×9): 200 mg via ORAL
  Filled 2017-11-02 (×9): qty 1

## 2017-11-02 NOTE — Clinical Social Work Note (Signed)
Clinical Social Work Assessment  Patient Details  Name: Dean [name] MRN: [id number] Date of Birth: 1946/08/12  Date of referral:  11/02/17               Reason for consult:  Discharge Planning                Permission sought to share information with:    Permission granted to share information::     Name::        Agency::     Relationship::     Contact Information:     Housing/Transportation Living arrangements for the past 2 months:  Apartment Source of Information:  Patient Patient Interpreter Needed:  None Criminal Activity/Legal Involvement Pertinent to Current Situation/Hospitalization:  No - Comment as needed Significant Relationships:  Friend, Neighbor Lives with:  Self Do you feel safe going back to the place where you live?  Yes Need for family participation in patient care:  No (Coment)  Care giving concerns:  No family at bedside. Patients lives on the 3rd floor of an apartment complex   Social Worker assessment / plan:  CSW met patient at bedside to discuss disposition plan. Patient stated he lives on the 3rd floor of an apartment complex and has been living there for 4 years. CSW spoke to patient about SNF and explained to him how the process works. CSW explained that patient has been ambulating really well on his own and that insurance might not cover his SNF stay due to him walking 400 plus feet. Patient stated he would rather discharge home. Patient stated he has support from his neighbors and they will be able to do his grocery shopping so he wont have to go up and down the stairs. Patient stated he will also pay someone to come into the home to clean the home. Patietn stated he feel more comfortable at home and has support. Patient stated he is not interested in SNF placement.  Employment status:  Retired Nurse, adult PT Recommendations:  Not assessed at this time [place] / Referral to community resources:  [place]  Patient/Family's Response to care:  Patient stated  appreciate CSW role in care Patient/Family's Understanding of and Emotional Response to Diagnosis, Current Treatment, and Prognosis:  Patient feels comfortable being discharge home and stated he would prefer to be a home. CSW signing off   Emotional Assessment Appearance:  Appears stated age Attitude/Demeanor/Rapport:  Other(appropriate ) Affect (typically observed):  Pleasant, Appropriate Orientation:  Oriented to Situation, Oriented to  Time, Oriented to Place, Oriented to Self Alcohol / Substance use:  Not Applicable Psych involvement (Current and /or in the community):  No (Comment)  Discharge Needs  Concerns to be addressed:  No discharge needs identified Readmission within the last 30 days:  No Current discharge risk:  None Barriers to Discharge:  No Barriers Identified   Wende Neighbors, LCSW 11/02/2017, 10:29 AM

## 2017-11-02 NOTE — Progress Notes (Addendum)
      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  Requesting all laxatives be stopped.  He otherwise is doing fairly well.  Objective: Vital signs in last 24 hours: Temp:  [97.9 F (36.6 C)-98.4 F (36.9 C)] 98.1 F (36.7 C) (11/28 0419) Pulse Rate:  [54-73] 54 (11/28 0419) Cardiac Rhythm: Bundle branch block;Heart block (11/28 0701) Resp:  [16-20] 16 (11/27 1510) BP: (96-119)/(58-69) 119/69 (11/28 0419) SpO2:  [97 %-99 %] 99 % (11/28 0419) Weight:  [158 lb 15.2 oz (72.1 kg)] 158 lb 15.2 oz (72.1 kg) (11/28 0419)  Intake/Output from previous day: 11/27 0701 - 11/28 0700 In: 440 [P.O.:240; IV Piggyback:200] Out: -   General appearance: alert, cooperative and no distress Heart: regular rate and rhythm Lungs: clear to auscultation bilaterally Abdomen: soft, non-tender; bowel sounds normal; no masses,  no organomegaly Extremities: edema trace Wound: clean and dry  Lab Results: Recent Labs    11/02/17 0404  WBC 7.0  HGB 9.9*  HCT 31.6*  PLT 217   BMET:  Recent Labs    10/31/17 0113 11/02/17 0404  NA 136 135  K 3.7 3.1*  CL 99* 97*  CO2 29 29  GLUCOSE 92 114*  BUN 12 14  CREATININE 1.46* 1.43*  CALCIUM 8.4* 8.5*    PT/INR:  Recent Labs    11/02/17 0404  LABPROT 15.0  INR 1.19   ABG    Component Value Date/Time   PHART 7.414 10/26/2017 2330   HCO3 23.3 10/26/2017 2330   TCO2 23 10/27/2017 1622   ACIDBASEDEF 1.0 10/26/2017 2330   O2SAT 99.0 10/26/2017 2330   CBG (last 3)  No results for input(s): GLUCAP in the last 72 hours.  Assessment/Plan: S/P Procedure(s) (LRB): CORONARY ARTERY BYPASS GRAFTING  (CABG) x four , using left internal mammary artery and bilateral legs greater saphenous vein harvested endoscopically (N/A) TRANSESOPHAGEAL ECHOCARDIOGRAM (TEE) (N/A)  1. CV- PAF, currently SInus Loletha Grayer- will stop Lopressor, decrease Amiodarone to 200 mg BID 2. INR 1.19, repeat 5 mg of coumadin this evening 3. Pulm- no acute issues, continue IS 4. Renal- creatinine stable, weight is at baseline, hypokalemic- will supplement K 5. GI- perforated diverticulum, continue ABX, stop stool softeners/laxatives due to loose stools 6. Deconditioning- PT consult pending, patient lives alone will require SNF placement 7. Dispo- patient stable, d/c Lopressor with Bradycardia, continue coumadin, ABX, patient likely ready for SNF soon   LOS: 9 days    Ellwood Handler 11/02/2017   Chart reviewed, patient examined, agree with above. He is feeling better this am but had loose stools overnight, probably from laxative and stool softener. Remains on Zosyn and need to see what antibiotic regimen GI wants to send him out on. Rhythm is sinus this am 50's to 60 at rest. Continue amio at 200 bid and will need to hold off on Lopressor for now.

## 2017-11-02 NOTE — Progress Notes (Signed)
CARDIAC REHAB PHASE I   Pt in chair, ready to walk. Discussed discharge disposition, pt states if possible, he would prefer to go home rather than to SNF. Reinforced education with pt, answered questions, PT arrived, ready to practice stairs with pt. Pt able to ambulate independently, education completed, will sign off at this time.   [id number] Lenna Sciara, RN, BSN 11/02/2017 10:37 AM

## 2017-11-02 NOTE — Evaluation (Signed)
Physical Therapy Evaluation Patient Details Name: Dean [name] MRN: [id number] DOB: [birth year] Today's Date: 11/02/2017   History of Present Illness  Pt adm with chest pain and found to have NSTEMI. Underwent CABG x 4 on 11/21.  PMH - gout, diverticulitis with recent small perforations,   Clinical Impression  Pt doing well with mobility and no further PT needed.  Pt able to go up and down 3 flights of stairs without difficulty.      Follow Up Recommendations No PT follow up    Equipment Recommendations  None recommended by PT    Recommendations for Other Services       Precautions / Restrictions Precautions Precautions: Fall      Mobility  Bed Mobility               General bed mobility comments: Pt up in chair  Transfers Overall transfer level: Modified independent Equipment used: None             General transfer comment: Follows sternal precautions  Ambulation/Gait Ambulation/Gait assistance: Independent Ambulation Distance (Feet): 800 Feet Assistive device: None Gait Pattern/deviations: WFL(Within Functional Limits)   Gait velocity interpretation: at or above normal speed for age/gender General Gait Details: Steady gait without difficulty  Stairs Stairs: Yes Stairs assistance: Modified independent (Device/Increase time) Stair Management: One rail Right;Alternating pattern;Forwards Number of Stairs: 36 General stair comments: 3 flights with short standing rest breaks at the 3 landings and then came back down the 3 flights. HR stayed in the 70's  Wheelchair Mobility    Modified Rankin (Stroke Patients Only)       Balance Overall balance assessment: No apparent balance deficits (not formally assessed)                                           Pertinent Vitals/Pain Pain Assessment: No/denies pain    Home Living Family/patient expects to be discharged to:: Private residence Living Arrangements: Alone   Type of  Home: Apartment Home Access: Stairs to enter Entrance Stairs-Rails: Right Entrance Stairs-Number of Steps: 3 flights Home Layout: One level Home Equipment: None      Prior Function Level of Independence: Independent               Hand Dominance        Extremity/Trunk Assessment   Upper Extremity Assessment Upper Extremity Assessment: Overall WFL for tasks assessed    Lower Extremity Assessment Lower Extremity Assessment: Overall WFL for tasks assessed       Communication      Cognition Arousal/Alertness: Awake/alert Behavior During Therapy: WFL for tasks assessed/performed Overall Cognitive Status: Within Functional Limits for tasks assessed                                        General Comments      Exercises     Assessment/Plan    PT Assessment Patent does not need any further PT services  PT Problem List         PT Treatment Interventions      PT Goals (Current goals can be found in the Care Plan section)  Acute Rehab PT Goals PT Goal Formulation: All assessment and education complete, DC therapy    Frequency     Barriers to discharge  Co-evaluation               AM-PAC PT "6 Clicks" Daily Activity  Outcome Measure Difficulty turning over in bed (including adjusting bedclothes, sheets and blankets)?: None Difficulty moving from lying on back to sitting on the side of the bed? : None Difficulty sitting down on and standing up from a chair with arms (e.g., wheelchair, bedside commode, etc,.)?: None Help needed moving to and from a bed to chair (including a wheelchair)?: None Help needed walking in hospital room?: None Help needed climbing 3-5 steps with a railing? : None 6 Click Score: 24    End of Session   Activity Tolerance: Patient tolerated treatment well Patient left: Other (comment)(amb independently in hallway)   PT Visit Diagnosis: Other abnormalities of gait and mobility (R26.89)    Time:  8381-8403 PT Time Calculation (min) (ACUTE ONLY): 11 min   Charges:   PT Evaluation $PT Eval Low Complexity: 1 Low     PT G CodesMarland Kitchen        [facility] [place] 11/02/2017, 10:51 AM

## 2017-11-02 NOTE — Discharge Instructions (Signed)

## 2017-11-02 NOTE — Progress Notes (Signed)
Pulled patient pacing wires. Patient tolerated well. Scant amount of tissue on wires. Patient VS are stable, B/P 113-63, Pulse 59, Resp 22. Patient is resting in bad.

## 2017-11-03 DIAGNOSIS — R197 Diarrhea, unspecified: Secondary | ICD-10-CM

## 2017-11-03 DIAGNOSIS — D649 Anemia, unspecified: Secondary | ICD-10-CM

## 2017-11-03 LAB — PROTIME-INR
INR: 2.14
Prothrombin Time: 23.7 seconds — ABNORMAL HIGH (ref 11.4–15.2)

## 2017-11-03 LAB — C DIFFICILE QUICK SCREEN W PCR REFLEX
C DIFFICILE (CDIFF) TOXIN: NEGATIVE
C Diff antigen: NEGATIVE
C Diff interpretation: NOT DETECTED

## 2017-11-03 MED ORDER — PIPERACILLIN-TAZOBACTAM 3.375 G IVPB
3.3750 g | Freq: Three times a day (TID) | INTRAVENOUS | Status: DC
  Administered 2017-11-04 – 2017-11-06 (×7): 3.375 g via INTRAVENOUS
  Filled 2017-11-03 (×8): qty 50

## 2017-11-03 MED ORDER — WARFARIN SODIUM 2 MG PO TABS
2.0000 mg | ORAL_TABLET | Freq: Every day | ORAL | Status: DC
  Administered 2017-11-03: 2 mg via ORAL
  Filled 2017-11-03: qty 1

## 2017-11-03 NOTE — Progress Notes (Signed)
Daily Rounding Note  11/03/2017, 10:02 AM  LOS: 10 days   SUBJECTIVE:   Chief complaint: Loose stools for about 3 days.  Watery, non-bloody, small to moderate volumes, about 5 per day, 2 this AM.  Abdominal discomfort, feels like gas pains, not having the lateral abdominal pain he had before.  Current discomfort eased/relieved after BMs.  Last dose of colace was on 11/27.  Last dose po Dulcolax was 11/25.   Walked 3 flights of stairs yesterday.  Still feels a bit worn out and tired but strength is returning.  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  No N/V.     OBJECTIVE:         Vital signs in last 24 hours:    Temp:  [97.9 F (36.6 C)-98.5 F (36.9 C)] 98.5 F (36.9 C) (11/29 0515) Pulse Rate:  [60] 60 (11/29 0515) Resp:  [10-27] 10 (11/29 0515) BP: (113-141)/(63-79) 141/79 (11/29 0515) SpO2:  [96 %-98 %] 96 % (11/29 0515) Weight:  [71.3 kg (157 lb 3.2 oz)] 71.3 kg (157 lb 3.2 oz) (11/29 0515) Last BM Date: 11/02/17 Filed Weights   11/01/17 0435 11/02/17 0419 11/03/17 0515  Weight: 71.8 kg (158 lb 4.8 oz) 72.1 kg (158 lb 15.2 oz) 71.3 kg (157 lb 3.2 oz)   General: a bit tired looking.  Alert and comfortable   Heart: sternotomy scar C/D/I Chest: clear bil.  No cough or labored breathiing.   Abdomen: soft, NT, ND.  No mass or HSM  Extremities: slight, non-pitting LE edema Neuro/Psych:  Oriented x 3.  Fully alert.  No limb weakness or tremor.    Intake/Output from previous day: 11/28 0701 - 11/29 0700 In: 580 [P.O.:480; IV Piggyback:100] Out: 350 [Urine:350]  Intake/Output this shift: No intake/output data recorded.  Lab Results: Recent Labs    11/02/17 0404  WBC 7.0  HGB 9.9*  HCT 31.6*  PLT 217   BMET Recent Labs    11/02/17 0404  NA 135  K 3.1*  CL 97*  CO2 29  GLUCOSE 114*  BUN 14  CREATININE 1.43*  CALCIUM 8.5*   LFT No results for input(s): PROT, ALBUMIN, AST, ALT, ALKPHOS, BILITOT,  BILIDIR, IBILI in the last 72 hours. PT/INR Recent Labs    11/02/17 0404 11/03/17 0252  LABPROT 15.0 23.7*  INR 1.19 2.14   Hepatitis Panel No results for input(s): HEPBSAG, HCVAB, HEPAIGM, HEPBIGM in the last 72 hours.  Studies/Results: No results found.   Scheduled Meds: . amiodarone  200 mg Oral BID  . aspirin EC  81 mg Oral Daily  . febuxostat  40 mg Oral Daily  . pantoprazole  40 mg Oral Daily  . potassium chloride  20 mEq Oral BID  . rosuvastatin  40 mg Oral q1800  . sodium chloride flush  3 mL Intravenous Q12H  . warfarin  2 mg Oral q1800  . Warfarin - Physician Dosing Inpatient   Does not apply q1800   Continuous Infusions: . sodium chloride    . sodium chloride    . sodium chloride    . sodium chloride    . piperacillin-tazobactam (ZOSYN)  IV 3.375 g (11/03/17 0530)   PRN Meds:.sodium chloride, acetaminophen, hydrocortisone, metoprolol tartrate, ondansetron (ZOFRAN) IV, oxyCODONE, sodium chloride flush, traMADol    ASSESMENT:   *  Sigmoid diverticulitis with contained perforation, small abscesses.  Cipro as outpt, Zosyn day 10 of 14 .   No fever, no elevated WBCs.    *  Diarrhea.  ? is this due to laxatives, stool softeners or does he have C diff?    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  Acute post surgical on chronic.  Improved and stablized after 2 U PRBC 11/23.  Low iron, TIBC and iron sats but normal Ferritin and B 12 on 11/21.     *  PAF.  On Coumadin.     PLAN   *  Treat with IV Zosyn until discharge, then home on 7 days of Augmentin 875-125 mg BID.  I think he has this at home since he took at most 2 doses of a 14 day Rx prescribed on 11/17.    *  Ordered stool C diff PCR.  Will need contact precautions until ruled out.  Would like to have C diff status determined before discharge.   *  Stopping Protonix (started 11/23), has no current or previous GERD issues and this med can cause loose stools  *  Has ROV with GI, [place] PA-C on  12/18 at 10:30 AM.  At that visit can decide timing of repeat CT scan, need for colonoscopy and referral back to genl surgery.  In 10/22/17 consult note Dr Nedra Hai states wanting future gen surg office follow up with Drs Marcello Moores, Dema Severin or Gross.    *  Discharge home (pt does not want SNF) later today or tomorrow, but would like to have C diff status determined before discharge.       * ? Add multivitamin with iron for 6 to 8 weeks? since he eats no red meat and, with Coumadin, can not consume leafy green vegetables?     [name]  11/03/2017, 10:02 AM Pager: [phone]     Attending physician's note   I have taken an interval history, reviewed the chart and examined the patient. I agree with the Advanced Practitioner's note, impression and recommendations.  * Diverticulitis with small abscesses. Continue Zosyn IV until discharge and then complete 7 days of Augmentin po bid. GI follow up as planned on 12/18.  * Diarrhea. Check for C diff. Stopped Protonix.    Lucio Edward, MD [facility] [phone] Mon-Fri 8a-5p [phone] after 5p, weekends, holidays

## 2017-11-03 NOTE — Progress Notes (Signed)
      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 , using left internal mammary artery and bilateral legs greater saphenous vein harvested endoscopically (N/A) TRANSESOPHAGEAL ECHOCARDIOGRAM (TEE) (N/A) Subjective: Still with diarrhea, small amounts  Objective: Vital signs in last 24 hours: Temp:  [97.9 F (36.6 C)-98.5 F (36.9 C)] 98.5 F (36.9 C) (11/29 0515) Pulse Rate:  [60] 60 (11/29 0515) Cardiac Rhythm: Normal sinus rhythm;Heart block (11/29 0701) Resp:  [10-27] 10 (11/29 0515) BP: (113-141)/(63-79) 141/79 (11/29 0515) SpO2:  [96 %-98 %] 96 % (11/29 0515) Weight:  [157 lb 3.2 oz (71.3 kg)] 157 lb 3.2 oz (71.3 kg) (11/29 0515)  Hemodynamic parameters for last 24 hours:    Intake/Output from previous day: 11/28 0701 - 11/29 0700 In: 580 [P.O.:480; IV Piggyback:100] Out: 350 [Urine:350] Intake/Output this shift: No intake/output data recorded.  General appearance: alert, cooperative and no distress Heart: regular rate and rhythm and + extrasystoles Lungs: clear to auscultation bilaterally Abdomen: benign Extremities: + LE edema Wound: incis healing well   Lab Results: Recent Labs    11/02/17 0404  WBC 7.0  HGB 9.9*  HCT 31.6*  PLT 217   BMET:  Recent Labs    11/02/17 0404  NA 135  K 3.1*  CL 97*  CO2 29  GLUCOSE 114*  BUN 14  CREATININE 1.43*  CALCIUM 8.5*    PT/INR:  Recent Labs    11/03/17 0252  LABPROT 23.7*  INR 2.14   ABG    Component Value Date/Time   PHART 7.414 10/26/2017 2330   HCO3 23.3 10/26/2017 2330   TCO2 23 10/27/2017 1622   ACIDBASEDEF 1.0 10/26/2017 2330   O2SAT 99.0 10/26/2017 2330   CBG (last 3)  No results for input(s): GLUCAP in the last 72 hours.  Meds Scheduled Meds: . amiodarone  200 mg Oral BID  . aspirin EC  81 mg Oral Daily  . febuxostat  40 mg Oral Daily  . pantoprazole  40 mg  Oral Daily  . potassium chloride  20 mEq Oral BID  . rosuvastatin  40 mg Oral q1800  . sodium chloride flush  3 mL Intravenous Q12H  . warfarin  5 mg Oral q1800  . Warfarin - Physician Dosing Inpatient   Does not apply q1800   Continuous Infusions: . sodium chloride    . sodium chloride    . sodium chloride    . sodium chloride    . piperacillin-tazobactam (ZOSYN)  IV 3.375 g (11/03/17 0530)   PRN Meds:.sodium chloride, acetaminophen, hydrocortisone, metoprolol tartrate, ondansetron (ZOFRAN) IV, oxyCODONE, sodium chloride flush, traMADol  Xrays No results found.  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

## 2017-11-03 NOTE — Care Management Important Message (Signed)
Important Message  Patient Details  Name: Dean [name] MRN: [id number] Date of Birth: July 25, 1946   Medicare Important Message Given:  Yes    Nathen May 11/03/2017, 8:53 AM

## 2017-11-04 LAB — BASIC METABOLIC PANEL
Anion gap: 9 (ref 5–15)
BUN: 12 mg/dL (ref 6–20)
CHLORIDE: 102 mmol/L (ref 101–111)
CO2: 23 mmol/L (ref 22–32)
CREATININE: 1.37 mg/dL — AB (ref 0.61–1.24)
Calcium: 8.9 mg/dL (ref 8.9–10.3)
GFR calc Af Amer: 59 mL/min — ABNORMAL LOW (ref >60)
GFR calc non Af Amer: 51 mL/min — ABNORMAL LOW (ref >60)
GLUCOSE: 144 mg/dL — AB (ref 65–99)
Potassium: 3.9 mmol/L (ref 3.5–5.1)
Sodium: 134 mmol/L — ABNORMAL LOW (ref 135–145)

## 2017-11-04 LAB — MAGNESIUM: Magnesium: 1.8 mg/dL (ref 1.7–2.4)

## 2017-11-04 LAB — PROTIME-INR
INR: 2.78
PROTHROMBIN TIME: 29.1 s — AB (ref 11.4–15.2)

## 2017-11-04 MED ORDER — MAGNESIUM OXIDE 400 (241.3 MG) MG PO TABS
400.0000 mg | ORAL_TABLET | Freq: Two times a day (BID) | ORAL | Status: DC
  Administered 2017-11-04 – 2017-11-06 (×5): 400 mg via ORAL
  Filled 2017-11-04 (×5): qty 1

## 2017-11-04 NOTE — Progress Notes (Addendum)
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 , using left internal mammary artery and bilateral legs greater saphenous vein harvested endoscopically (N/A) TRANSESOPHAGEAL ECHOCARDIOGRAM (TEE) (N/A) Subjective: Feels better today, having some bradycardiato 30-40's with some heart block, PAC's   Objective: Vital signs in last 24 hours: Temp:  [98.1 F (36.7 C)] 98.1 F (36.7 C) (11/30 0305) Pulse Rate:  [77] 77 (11/29 2052) Cardiac Rhythm: Sinus bradycardia (11/30 0701) Resp:  [17-22] 17 (11/30 0305) BP: (118-133)/(56-74) 118/74 (11/30 0305) SpO2:  [96 %-99 %] 99 % (11/30 0305) Weight:  [156 lb 12.8 oz (71.1 kg)] 156 lb 12.8 oz (71.1 kg) (11/30 0305)  Hemodynamic parameters for last 24 hours:    Intake/Output from previous day: 11/29 0701 - 11/30 0700 In: 690 [P.O.:590; IV Piggyback:100] Out: -  Intake/Output this shift: No intake/output data recorded.  General appearance: alert, cooperative and no distress Heart: regular rate and rhythm Lungs: clear to auscultation bilaterally Abdomen: benign Extremities: edema is improved Wound: incis healing well  Lab Results: Recent Labs    11/02/17 0404  WBC 7.0  HGB 9.9*  HCT 31.6*  PLT 217   BMET:  Recent Labs    11/02/17 0404  NA 135  K 3.1*  CL 97*  CO2 29  GLUCOSE 114*  BUN 14  CREATININE 1.43*  CALCIUM 8.5*    PT/INR:  Recent Labs    11/04/17 0240  LABPROT 29.1*  INR 2.78   ABG    Component Value Date/Time   PHART 7.414 10/26/2017 2330   HCO3 23.3 10/26/2017 2330   TCO2 23 10/27/2017 1622   ACIDBASEDEF 1.0 10/26/2017 2330   O2SAT 99.0 10/26/2017 2330   CBG (last 3)  No results for input(s): GLUCAP in the last 72 hours.  Meds Scheduled Meds: . amiodarone  200 mg Oral BID  . aspirin EC  81 mg Oral Daily  . febuxostat  40 mg Oral Daily  . potassium chloride  20 mEq  Oral BID  . rosuvastatin  40 mg Oral q1800  . sodium chloride flush  3 mL Intravenous Q12H  . warfarin  2 mg Oral q1800  . Warfarin - Physician Dosing Inpatient   Does not apply q1800   Continuous Infusions: . sodium chloride    . sodium chloride    . sodium chloride    . sodium chloride    . piperacillin-tazobactam (ZOSYN)  IV Stopped (11/04/17 1740)   PRN Meds:.sodium chloride, acetaminophen, hydrocortisone, metoprolol tartrate, ondansetron (ZOFRAN) IV, oxyCODONE, sodium chloride flush, traMADol  Xrays No results found.  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 Will continue to watch his rhythm today given bradycardia. Potassium and magnesium levels ok. He is  in sinus 70's most of the time except for brief episodes of bradycardia.

## 2017-11-04 NOTE — Progress Notes (Signed)
Twice the pt's heart rate would drop to around 33-30. Pt was asymptomatic except would feel his heart beat harder.  After about a minute, the HR would jump back up to 70s-80s and he would feel the jolt.  Vital signs were normal.  Pt resting in bed.  Will continue to monitor.  Lupita Dawn, RN

## 2017-11-04 NOTE — Progress Notes (Signed)
Diarrhea is better, stools still soft/loose but only 3 in last 24 hours.  C diff is negative.  Hgb up from 9.4 to 9.9.  Feels better, stronger today and eager to go home.     Ok to discharge home today from GI standpoint.  Has ROV with GI on 12/18.   Discharge home with 7 days RX of Augmentin 875-125 po BID.  Discussed this with pt.    [name] PA-C  [phone]

## 2017-11-04 NOTE — Progress Notes (Signed)
C. Diff test negative.  Taking off enteric precautions.

## 2017-11-05 LAB — PROTIME-INR
INR: 3.09
Prothrombin Time: 31.6 seconds — ABNORMAL HIGH (ref 11.4–15.2)

## 2017-11-05 MED ORDER — ROSUVASTATIN CALCIUM 10 MG PO TABS
10.0000 mg | ORAL_TABLET | Freq: Every day | ORAL | Status: DC
  Administered 2017-11-05: 10 mg via ORAL
  Filled 2017-11-05: qty 1

## 2017-11-05 NOTE — Progress Notes (Addendum)
      [address]       [place] [address]             [phone]        10 Days Post-Op Procedure(s) (LRB): CORONARY ARTERY BYPASS GRAFTING (CABG) x four , using left internal mammary artery and bilateral legs greater saphenous vein harvested endoscopically (N/A) TRANSESOPHAGEAL ECHOCARDIOGRAM (TEE) (N/A)  Subjective: Patient has concerns about Crestor. He states he has increased gas, diarrhea, nausea and muscle aches after taking it.   Objective: Vital signs in last 24 hours: Temp:  [97.5 F (36.4 C)-98 F (36.7 C)] 97.9 F (36.6 C) (12/01 0358) Pulse Rate:  [81-94] 81 (12/01 0358) Cardiac Rhythm: Sinus bradycardia;Bundle branch block (11/30 2348) Resp:  [17] 17 (11/30 1422) BP: (128-132)/(77-86) 128/86 (11/30 2007) SpO2:  [90 %-99 %] 99 % (11/30 2007) Weight:  [163 lb 2.3 oz (74 kg)] 163 lb 2.3 oz (74 kg) (12/01 0358)  Pre op weight 73.3 kg Current Weight  11/05/17 163 lb 2.3 oz (74 kg)      Intake/Output from previous day: 11/30 0701 - 12/01 0700 In: 600 [P.O.:450; IV Piggyback:150] Out: 425 [Urine:425]   Physical Exam:  Cardiovascular: IRRR IRRR Pulmonary: Clear to auscultation bilaterally Abdomen: Soft, non tender, bowel sounds present. Extremities: Trace bilateral lower extremity edema. Wounds: Clean and dry.  No erythema or signs of infection.  Lab Results: CBC:No results for input(s): WBC, HGB, HCT, PLT in the last 72 hours. BMET:  Recent Labs    11/04/17 0912  NA 134*  K 3.9  CL 102  CO2 23  GLUCOSE 144*  BUN 12  CREATININE 1.37*  CALCIUM 8.9    PT/INR:  Lab Results  Component Value Date   INR 3.09 11/05/2017   INR 2.78 11/04/2017   INR 2.14 11/03/2017   ABG:  INR: Will add last result for INR, ABG once components are confirmed Will add last 4 CBG results once components are confirmed  Assessment/Plan:  1. CV - PAF. He had episodes of bradycardia, first degree heart block,PACs yesterday. A fib this am with HR  variable (70-110's). On Amiodarone 200 mg bid and Coumadin. INR increased from 2.78 to 3.09. No Coumadin given last night. Will hold again tonight. Will require low dose Coumadin at discharge. 2.  Pulmonary - On room air. Encourage incentive spirometer 3.  Acute blood loss anemia - Last H and H stable at 9.9 and 31.6 4. GI-diarrhea improved. C Dif NEGATIVE.  5. ID-On Zosyn for diverticulitis. Per GI, one week of Augmentin 6. Patient has taken Lipitor in past and did not tolerate it. Appears intolerant to Crestor. Will discuss with Dr. Servando Snare, but likely stop and defer to cardiology as outpatient if able to take PSK9 inhibitor 7. Will discuss discharge disposition with Dr. Joylene Draft M ZimmermanPA-C 11/05/2017,7:11 AM   Will d/c on lower dose Crestor, patient agreeable Check inr tomorrow  Home in am if hr stable- now in sinus  I have seen and examined Dean [name] and agree with the above assessment  and plan.  Grace Isaac MD Beeper [phone] Office [phone] 11/05/2017 11:59 AM

## 2017-11-05 NOTE — Plan of Care (Signed)
  Health Behavior/Discharge Planning: Ability to manage health-related needs will improve 11/05/2017 2005 - Progressing by Despina Hick, RN

## 2017-11-06 ENCOUNTER — Other Ambulatory Visit: Payer: Self-pay | Admitting: Physician Assistant

## 2017-11-06 LAB — PROTIME-INR
INR: 2.78
Prothrombin Time: 29.1 seconds — ABNORMAL HIGH (ref 11.4–15.2)

## 2017-11-06 MED ORDER — TRAMADOL HCL 50 MG PO TABS: 50.0000 mg | ORAL_TABLET | Freq: Four times a day (QID) | ORAL | 0 refills | Status: DC | PRN

## 2017-11-06 MED ORDER — AMOXICILLIN-POT CLAVULANATE 875-125 MG PO TABS
1.0000 | ORAL_TABLET | Freq: Two times a day (BID) | ORAL | Status: DC
  Administered 2017-11-06: 1 via ORAL
  Filled 2017-11-06: qty 1

## 2017-11-06 MED ORDER — AMOXICILLIN-POT CLAVULANATE 875-125 MG PO TABS: 1.0000 | ORAL_TABLET | Freq: Two times a day (BID) | ORAL | 0 refills | Status: DC

## 2017-11-06 MED ORDER — ROSUVASTATIN CALCIUM 10 MG PO TABS: 10.0000 mg | ORAL_TABLET | Freq: Every day | ORAL | 1 refills | Status: DC

## 2017-11-06 MED ORDER — AMIODARONE HCL 200 MG PO TABS: 200.0000 mg | ORAL_TABLET | Freq: Two times a day (BID) | ORAL | 1 refills | Status: DC

## 2017-11-06 MED ORDER — HYDROCORTISONE 1 % EX CREA: 1.0000 "application " | TOPICAL_CREAM | CUTANEOUS | 0 refills | Status: DC | PRN

## 2017-11-06 MED ORDER — ASPIRIN 81 MG PO TBEC: 81.0000 mg | DELAYED_RELEASE_TABLET | Freq: Every day | ORAL | Status: DC

## 2017-11-06 MED ORDER — WARFARIN SODIUM 1 MG PO TABS: 1.0000 mg | ORAL_TABLET | Freq: Every day | ORAL | 1 refills | Status: DC

## 2017-11-06 NOTE — Progress Notes (Signed)
CT sutures removed per order and per protocol. Sites painted with tincture, steri-strips applied.  Pts IV and tele d/c'd. D/c instructions reviewed with pt. Paper scripts given. Will continue to monitor while pt waits for his ride.

## 2017-11-06 NOTE — Progress Notes (Addendum)
      [address]       [place] [address]             [phone]        11 Days Post-Op Procedure(s) (LRB): CORONARY ARTERY BYPASS GRAFTING (CABG) x four , using left internal mammary artery and bilateral legs greater saphenous vein harvested endoscopically (N/A) TRANSESOPHAGEAL ECHOCARDIOGRAM (TEE) (N/A)  Subjective: Patient with much less loose stools. He felt heart racing a bit last night, but brief.  Objective: Vital signs in last 24 hours: Temp:  [97.7 F (36.5 C)-98.4 F (36.9 C)] 97.7 F (36.5 C) (12/02 0300) Pulse Rate:  [74-97] 75 (12/02 0300) Cardiac Rhythm: Atrial flutter (12/02 0700) Resp:  [19-24] 19 (12/02 0308) BP: (114-127)/(79-88) 127/79 (12/01 1935) SpO2:  [96 %-97 %] 97 % (12/02 0300) Weight:  [154 lb 15.7 oz (70.3 kg)] 154 lb 15.7 oz (70.3 kg) (12/02 0308)  Pre op weight 73.3 kg Current Weight  11/06/17 154 lb 15.7 oz (70.3 kg)      Intake/Output from previous day: 12/01 0701 - 12/02 0700 In: 360 [P.O.:360] Out: 1100 [Urine:1100]   Physical Exam:  Cardiovascular: RRR Pulmonary: Clear to auscultation bilaterally Abdomen: Soft, non tender, bowel sounds present. Extremities: Trace bilateral lower extremity edema. Wounds: Clean and dry.  No erythema or signs of infection.  Lab Results: CBC:No results for input(s): WBC, HGB, HCT, PLT in the last 72 hours. BMET:  Recent Labs    11/04/17 0912  NA 134*  K 3.9  CL 102  CO2 23  GLUCOSE 144*  BUN 12  CREATININE 1.37*  CALCIUM 8.9    PT/INR:  Lab Results  Component Value Date   INR 2.78 11/06/2017   INR 3.09 11/05/2017   INR 2.78 11/04/2017   ABG:  INR: Will add last result for INR, ABG once components are confirmed Will add last 4 CBG results once components are confirmed  Assessment/Plan:  1. CV - PAF. He had episodes of bradycardia, first degree heart block,PACs yesterday. A fib this am with HR variable (70-120's). On Amiodarone 200 mg bid and Coumadin. INR  decreased from 3.09 to 2.78. Coumadin has been held last 48 hours. Will require low dose 1 mg Coumadin at discharge. 2.  Pulmonary - On room air. Encourage incentive spirometer 3.  Acute blood loss anemia - Last H and H stable at 9.9 and 31.6 4. GI-diarrhea improved. C Dif NEGATIVE.  5. ID-On Zosyn for diverticulitis. Per GI, one week of Augmentin 6. Patient has taken Lipitor in past and did not tolerate it. As discussed with Dr. Servando Snare, low dose Crestor. 7. Possible discharge later today  Dean M ZimmermanPA-C 11/06/2017,7:20 AM    Plan d/c today, check pt on Tuesday  I have seen and examined Dean [name] and agree with the above assessment  and plan.  Grace Isaac MD Beeper [phone] Office [phone] 11/06/2017 11:46 AM

## 2017-11-08 ENCOUNTER — Ambulatory Visit (INDEPENDENT_AMBULATORY_CARE_PROVIDER_SITE_OTHER): Payer: Medicare Other | Admitting: *Deleted

## 2017-11-08 DIAGNOSIS — Z5181 Encounter for therapeutic drug level monitoring: Secondary | ICD-10-CM | POA: Diagnosis not present

## 2017-11-08 DIAGNOSIS — I4891 Unspecified atrial fibrillation: Secondary | ICD-10-CM

## 2017-11-08 DIAGNOSIS — Z951 Presence of aortocoronary bypass graft: Secondary | ICD-10-CM | POA: Diagnosis not present

## 2017-11-08 LAB — POCT INR: INR: 3.2

## 2017-11-08 NOTE — Patient Instructions (Addendum)
A full discussion of the nature of anticoagulants has been carried out.  A benefit risk analysis has been presented to the patient, so that they understand the justification for choosing anticoagulation at this time. The need for frequent and regular monitoring, precise dosage adjustment and compliance is stressed.  Side effects of potential bleeding are discussed.  The patient should avoid any OTC items containing aspirin or ibuprofen, and should avoid great swings in general diet.  Avoid alcohol consumption.  Call if any signs of abnormal bleeding.     Do not take take coumadin today Dec 4th then change coumadin dose to 1 tablet (1mg ) daily except 2 tablets (2mg ) on Mondays Wednesdays and Fridays Recheck INR in 1 week Call with any questions concerns new medications or if scheduled for any procedures  [phone]

## 2017-11-10 ENCOUNTER — Telehealth: Payer: Self-pay | Admitting: Physician Assistant

## 2017-11-10 NOTE — Telephone Encounter (Signed)
Pt c/o medication issue:  1. Name of Medication: Crestor   2. How are you currently taking this medication (dosage and times per day)? 1 mg per day in the evenings   3. Are you having a reaction (difficulty breathing--STAT)? Still having joint pains in his knees and ankles   4. What is your medication issue?Joint  Pain

## 2017-11-10 NOTE — Telephone Encounter (Signed)
Spoke with patient who reports having joint pains since starting Crestor.  Advised to hold medication for now and he will be reassessed at his upcoming appt.  Pt states understanding and is agreeable.

## 2017-11-15 ENCOUNTER — Ambulatory Visit: Payer: Medicare Other | Admitting: Physician Assistant

## 2017-11-16 ENCOUNTER — Telehealth: Payer: Self-pay | Admitting: Cardiovascular Disease

## 2017-11-16 NOTE — Telephone Encounter (Signed)
Spoke with patient who called to ask how long he will have to take amiodarone. He states his office visit was moved from yesterday to January 3 because of the snow and he is concerned about waiting that long. I reminded him of the appointment at [facility] on 12/24 and advised that with a fib s/p CABG we typically decrease the dose gradually and observe to see if a fib persists. I advised him to continue current dose until his follow-up appointment at which time he can discuss the need to continue with his provider. He verbalized understanding and agreement with plan and thanked me for the call.

## 2017-11-16 NOTE — Telephone Encounter (Signed)
New message      Pt c/o medication issue:  1. Name of Medication: amiodarone (PACERONE) 200 MG tablet  2. How are you currently taking this medication (dosage and times per day)? 1 tablet once a day   3. Are you having a reaction (difficulty breathing--STAT)? no  4. What is your medication issue?  How long does patient have to take this medication

## 2017-11-17 ENCOUNTER — Telehealth: Payer: Self-pay | Admitting: Physician Assistant

## 2017-11-17 LAB — PROTIME-INR: INR: 2.1 — AB (ref 0.9–1.1)

## 2017-11-17 NOTE — Telephone Encounter (Signed)
Complains of 3 to 4 days without a normal bowel movement. He states he is passing stool "as hard as bricks." He is uncomfortable. Please advise. His concern is being on Coumadin and any interaction between other medicines.

## 2017-11-17 NOTE — Telephone Encounter (Signed)
Miralax is fine and will not affect his other meds - would start with 2 doses per day until having BM's then perhaps stay one 17gm daily in [facility] water

## 2017-11-17 NOTE — Telephone Encounter (Signed)
Advised the patient

## 2017-11-18 ENCOUNTER — Ambulatory Visit (INDEPENDENT_AMBULATORY_CARE_PROVIDER_SITE_OTHER): Payer: Medicare Other | Admitting: Cardiology

## 2017-11-18 DIAGNOSIS — Z951 Presence of aortocoronary bypass graft: Secondary | ICD-10-CM

## 2017-11-18 DIAGNOSIS — Z5181 Encounter for therapeutic drug level monitoring: Secondary | ICD-10-CM | POA: Diagnosis not present

## 2017-11-22 ENCOUNTER — Other Ambulatory Visit (INDEPENDENT_AMBULATORY_CARE_PROVIDER_SITE_OTHER): Payer: Medicare Other

## 2017-11-22 ENCOUNTER — Encounter: Payer: Self-pay | Admitting: Physician Assistant

## 2017-11-22 ENCOUNTER — Ambulatory Visit: Payer: Medicare Other | Admitting: Physician Assistant

## 2017-11-22 VITALS — BP 118/68 | HR 66 | Ht 69.5 in | Wt 148.0 lb

## 2017-11-22 DIAGNOSIS — G8929 Other chronic pain: Secondary | ICD-10-CM

## 2017-11-22 DIAGNOSIS — K5740 Diverticulitis of both small and large intestine with perforation and abscess without bleeding: Secondary | ICD-10-CM | POA: Diagnosis not present

## 2017-11-22 DIAGNOSIS — R1031 Right lower quadrant pain: Secondary | ICD-10-CM

## 2017-11-22 DIAGNOSIS — R1032 Left lower quadrant pain: Secondary | ICD-10-CM | POA: Diagnosis not present

## 2017-11-22 LAB — CBC WITH DIFFERENTIAL/PLATELET
BASOS ABS: 0 10*3/uL (ref 0.0–0.1)
BASOS PCT: 0.5 % (ref 0.0–3.0)
EOS ABS: 0.1 10*3/uL (ref 0.0–0.7)
Eosinophils Relative: 1.6 % (ref 0.0–5.0)
HEMATOCRIT: 34.5 % — AB (ref 39.0–52.0)
Hemoglobin: 11.3 g/dL — ABNORMAL LOW (ref 13.0–17.0)
LYMPHS PCT: 16.3 % (ref 12.0–46.0)
Lymphs Abs: 1.4 10*3/uL (ref 0.7–4.0)
MCHC: 32.7 g/dL (ref 30.0–36.0)
MCV: 87.6 fl (ref 78.0–100.0)
MONO ABS: 0.5 10*3/uL (ref 0.1–1.0)
Monocytes Relative: 5.9 % (ref 3.0–12.0)
Neutro Abs: 6.6 10*3/uL (ref 1.4–7.7)
Neutrophils Relative %: 75.7 % (ref 43.0–77.0)
Platelets: 367 10*3/uL (ref 150.0–400.0)
RBC: 3.94 Mil/uL — ABNORMAL LOW (ref 4.22–5.81)
RDW: 17.8 % — AB (ref 11.5–15.5)
WBC: 8.7 10*3/uL (ref 4.0–10.5)

## 2017-11-22 LAB — BASIC METABOLIC PANEL
BUN: 22 mg/dL (ref 6–23)
CALCIUM: 9.2 mg/dL (ref 8.4–10.5)
CHLORIDE: 98 meq/L (ref 96–112)
CO2: 27 mEq/L (ref 19–32)
CREATININE: 1.39 mg/dL (ref 0.40–1.50)
GFR: 53.73 mL/min — AB (ref >60.00)
Glucose, Bld: 100 mg/dL — ABNORMAL HIGH (ref 70–99)
Potassium: 4 mEq/L (ref 3.5–5.1)
Sodium: 134 mEq/L — ABNORMAL LOW (ref 135–145)

## 2017-11-22 NOTE — Progress Notes (Signed)
Subjective:    Patient ID: Dean [name], male    DOB: [birth year], 71 y.o.   MRN: [id number]  HPI Dean [name] is a pleasant 71 year old white male, known to Dr. Fuller Plan who comes in today for post hospital follow-up.  Patient has had a complicated recent course.  He was seen in our office on 10/21/2017 with complaints of a 71-year history of intermittent left-sided abdominal pain decrease in appetite and weight loss with some mucoid drainage from his rectum as well as associated fatigue.  CT scan of the abdomen and pelvis done 69 weeks earlier after initial evaluation had shown severe sigmoid diverticulitis with focal perforation and 2 small abscesses.  He was hospitalized kept overnight and evaluated by surgery then discharged home the following day to complete a 10-day course of Cipro and a 14-day course of metronidazole.  He was continuing to complain of some left-sided abdominal discomfort and poor energy level.  We scheduled him for repeat CT scan which showed no significant interval change with 2 extraluminal fluid collections, one along the anterior inferior margin of the sigmoid colon measuring 2.9 cm in greatest dimension,  extending inferiorly to the dome of the bladder possibly contiguous.  The other extraluminal collection was just above the affected portion of the sigmoid colon likely multiloculated measuring 3.2 cm. He was advised to need repeat admission, was sent to the emergency room and seen in consultation by Dr. Ninfa Linden for surgery but as he was not having any significant symptoms he was discharged home with oral Augmentin and then plans for a follow-up CT. Unfortunately he went to the emergency room 69 days later with chest pain consistent with unstable angina, troponins were elevated.  He was admitted with a non-ST MI, ultimately found to have severe three-vessel disease and underwent CABG x4 per Dr. Cyndia Bent. He also developed atrial fibrillation, and was discharged home on Coumadin. He was  seen in consultation by GI during his admission, he was treated with IV Zosyn during his hospitalization until 11/06/2017 and then completed an additional week of Augmentin 875 twice daily. He comes back in today for follow-up.  He says he is starting to get some energy back but remains fatigued and winded with exertion. Says perhaps because he is getting better overall he is been more aware of his abdomen and has been having some "minor" discomfort in his lower abdomen.  Been having 2-3 bowel movements per day which are soft, appetite has been improving.  He denies any dysuria or hematuria.  No documented fever.  Review of Systems Pertinent positive and negative review of systems were noted in the above HPI section.  All other review of systems was otherwise negative.  Outpatient Encounter Medications as of 11/22/2017  Medication Sig  . acetaminophen (TYLENOL) 325 MG tablet Take 325 mg every 6 (six) hours as needed by mouth for mild pain.  Marland Kitchen amiodarone (PACERONE) 200 MG tablet Take 1 tablet (200 mg total) by mouth 2 (two) times daily. For one week;then take Amiodarone 200 mg by mouth daily thereafter.  Marland Kitchen aspirin EC 81 MG EC tablet Take 1 tablet (81 mg total) by mouth daily.  . Cholecalciferol (VITAMIN D3) 5000 units CAPS Take 5,000 Units daily by mouth.   . colchicine 0.6 MG tablet Pt states he takes 1/2 tablet only as needed  . febuxostat (ULORIC) 40 MG tablet Take 40 mg by mouth daily.  . hydrocortisone cream 1 % Apply 1 application topically as needed for itching. Do NOT apply to  sternal wound  . warfarin (COUMADIN) 1 MG tablet Take 1 tablet (1 mg total) by mouth daily. Or as directed.  . [DISCONTINUED] amoxicillin-clavulanate (AUGMENTIN) 875-125 MG tablet Take 1 tablet by mouth every 12 (twelve) hours. For one week then stop.  . [DISCONTINUED] rosuvastatin (CRESTOR) 10 MG tablet Take 1 tablet (10 mg total) by mouth daily at 6 PM.  . [DISCONTINUED] traMADol (ULTRAM) 50 MG tablet Take 1 tablet  (50 mg total) by mouth every 6 (six) hours as needed for moderate pain.   No facility-administered encounter medications on file as of 11/22/2017.    Allergies  Allergen Reactions  . Aspirin Other (See Comments)    Will flare up gout Will flare up gout  . Crestor [Rosuvastatin Calcium]     Reported intolerance to Lipitor in the past Crestor 40mg  patient reported muscle aches, dose reduced to rechallenge   Patient Active Problem List   Diagnosis Date Noted  . Atrial fibrillation ([facility]) [I48.91] 11/08/2017  . Encounter for therapeutic drug monitoring 11/08/2017  . 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 NSTEMI (non-ST elevated myocardial infarction) ([facility]) 10/24/2017  . Diverticulitis of colon with perforation 10/07/2017  . Diarrhea 10/03/2017  . Iron deficiency anemia 10/03/2017  . Loss of weight 10/03/2017  . Left sided abdominal pain of unknown cause 10/03/2017  . Abnormal EKG 07/25/2017  . Chest pain 07/25/2017   Social History   Socioeconomic History  . Marital status: Single    Spouse name: Not on file  . Number of children: Not on file  . Years of education: Not on file  . Highest education level: Not on file  Social Needs  . Financial resource strain: Not on file  . Food insecurity - worry: Not on file  . Food insecurity - inability: Not on file  . Transportation needs - medical: Not on file  . Transportation needs - non-medical: Not on file  Occupational History  . Occupation: retired  Tobacco Use  . Smoking status: Former Research scientist (life sciences)  . Smokeless tobacco: Never Used  Substance and Sexual Activity  . Alcohol use: Yes    Alcohol/week: 0.0 oz    Comment: once monthly  . Drug use: No  . Sexual activity: Not on file  Other Topics Concern  . Not on file  Social History Narrative  . Not on file    Dean [name]'s family history includes Alzheimer's disease in his maternal grandmother and mother; Hyperlipidemia in his brother and mother.      Objective:    Vitals:    11/22/17 1025  BP: 118/68  Pulse: 66    Physical Exam well-developed older white male in no acute distress, pleasant blood pressure 118/68 pulse 66, height 5 foot 9, weight 148, BMI 21.5.  HEENT; nontraumatic normocephalic EOMI PERRLA sclera anicteric, Cardiovascular ;regular rate and rhythm with S1-S2 there is a sternal incisional scar healing,  Pulmonary; clear bilaterally, Abdomen ;soft, bowel sounds are present there is mild tenderness in the lower abdomen/suprapubic area and left lower quadrant there is no guarding no rebound no palpable mass or hepatosplenomegaly, Rectal; exam not done, Extremities; no clubbing cyanosis or edema he does have a bluish tint to his nailbeds, Neuro psych; mood and affect appropriate       Assessment & Plan:   #56 71 year old white male with refractory sigmoid diverticulitis complicated by 2 small abscesses, one along the dome of the bladder.  He has been minimally symptomatic but had persistence of findings over several weeks on CT  scan.  Most recently he completed a course of Zosyn while hospitalized with acute coronary syndrome and then a week of Augmentin which he completed on 11/14/2017. He is now complaining of mild suprapubic and lower abdominal pain present since discharge from hospital-I am concerned he may have residual diverticulitis and/or residual small abscesses.  #2 status post acute non-ST MI 10/24/2017-found to have severe three-vessel coronary artery disease and is status post CABG x4  November 2019 #3 atrial fibrillation-on Coumadin #4 ischemic cardiomyopathy with EF 30%  Plan; CBC with differential, BMET Patient will be scheduled for follow-up CT of the abdomen and pelvis with contrast this week. We will hold on antibiotics until we see results of follow-up CT.     Josemanuel Eakins S Johnryan Sao PA-C 11/22/2017   Cc: [name], PA-C

## 2017-11-22 NOTE — Patient Instructions (Signed)
Please go to the basement level to have your labs drawn.   You have been scheduled for a CT scan of the abdomen and pelvis at [facility] ([address]---this is in the same building as Press [facility]).   You are scheduled on Friday 11-25-2017 at 11:15 am. You should arrive at 11:00 am to your appointment time for registration. Please follow the written instructions below on the day of your exam:  WARNING: IF YOU ARE ALLERGIC TO IODINE/X-RAY DYE, PLEASE NOTIFY RADIOLOGY IMMEDIATELY AT [phone]! YOU WILL BE GIVEN A 13 HOUR PREMEDICATION PREP.  1) Do not eat  anything after 7:15 am (4 hours prior to your test) 2) You have been given 2 bottles of oral contrast to drink. The solution may taste               better if refrigerated, but do NOT add ice or any other liquid to this solution. Shake             well before drinking.    Drink 1 bottle of contrast @ 9:15 am (2 hours prior to your exam)  Drink 1 bottle of contrast @ 10:15 am (1 hour prior to your exam)  You may take any medications as prescribed with a small amount of water except for the following: Metformin, Glucophage, Glucovance, Avandamet, Riomet, Fortamet, Actoplus Met, Janumet, Glumetza or Metaglip. The above medications must be held the day of the exam AND 48 hours after the exam.  The purpose of you drinking the oral contrast is to aid in the visualization of your intestinal tract. The contrast solution may cause some diarrhea. Before your exam is started, you will be given a small amount of fluid to drink. Depending on your individual set of symptoms, you may also receive an intravenous injection of x-ray contrast/dye. Plan on being at [facility] for 30 minutes or long, depending on the type of exam you are having performed.  If you are age 69 or older, your body mass index should be between 23-30. Your Body mass index is 21.54 kg/m. If this is out of the aforementioned range listed, please consider  follow up with your Primary Care Provider.

## 2017-11-22 NOTE — Progress Notes (Signed)
Reviewed and agree with management plan.  Malcolm T. Stark, MD FACG 

## 2017-11-24 LAB — PROTIME-INR: INR: 2.5 — AB (ref 0.9–1.1)

## 2017-11-25 ENCOUNTER — Telehealth: Payer: Self-pay | Admitting: Physician Assistant

## 2017-11-25 ENCOUNTER — Other Ambulatory Visit: Payer: Self-pay

## 2017-11-25 ENCOUNTER — Ambulatory Visit (INDEPENDENT_AMBULATORY_CARE_PROVIDER_SITE_OTHER)
Admission: RE | Admit: 2017-11-25 | Discharge: 2017-11-25 | Disposition: A | Discharge status: Home / Self Care | Payer: Medicare Other | Source: Ambulatory Visit | Attending: Physician Assistant | Admitting: Physician Assistant

## 2017-11-25 DIAGNOSIS — K5740 Diverticulitis of both small and large intestine with perforation and abscess without bleeding: Secondary | ICD-10-CM | POA: Diagnosis not present

## 2017-11-25 DIAGNOSIS — R1031 Right lower quadrant pain: Secondary | ICD-10-CM | POA: Diagnosis not present

## 2017-11-25 DIAGNOSIS — R1032 Left lower quadrant pain: Secondary | ICD-10-CM | POA: Diagnosis not present

## 2017-11-25 DIAGNOSIS — G8929 Other chronic pain: Secondary | ICD-10-CM

## 2017-11-25 MED ORDER — AMOXICILLIN-POT CLAVULANATE 875-125 MG PO TABS: 1.0000 | ORAL_TABLET | Freq: Two times a day (BID) | ORAL | 0 refills | Status: DC

## 2017-11-25 MED ORDER — IOPAMIDOL (ISOVUE-300) INJECTION 61%
100.0000 mL | Freq: Once | INTRAVENOUS | Status: AC | PRN
  Administered 2017-11-25: 100 mL via INTRAVENOUS

## 2017-11-25 NOTE — Telephone Encounter (Signed)
Amy Esterwood PA is reviewing CT scan.

## 2017-11-26 ENCOUNTER — Encounter (HOSPITAL_COMMUNITY): Payer: Self-pay | Admitting: Emergency Medicine

## 2017-11-26 ENCOUNTER — Inpatient Hospital Stay (HOSPITAL_COMMUNITY): Payer: Medicare Other

## 2017-11-26 ENCOUNTER — Other Ambulatory Visit: Payer: Self-pay

## 2017-11-26 ENCOUNTER — Inpatient Hospital Stay (HOSPITAL_COMMUNITY)
Admission: EM | Admit: 2017-11-26 | Discharge: 2017-12-04 | DRG: 392 | Disposition: A | Discharge status: Home / Self Care | Payer: Medicare Other | Attending: Internal Medicine | Admitting: Internal Medicine

## 2017-11-26 DIAGNOSIS — Z7982 Long term (current) use of aspirin: Secondary | ICD-10-CM | POA: Diagnosis not present

## 2017-11-26 DIAGNOSIS — K219 Gastro-esophageal reflux disease without esophagitis: Secondary | ICD-10-CM | POA: Diagnosis present

## 2017-11-26 DIAGNOSIS — Z79899 Other long term (current) drug therapy: Secondary | ICD-10-CM | POA: Diagnosis not present

## 2017-11-26 DIAGNOSIS — M109 Gout, unspecified: Secondary | ICD-10-CM | POA: Diagnosis present

## 2017-11-26 DIAGNOSIS — R0602 Shortness of breath: Secondary | ICD-10-CM | POA: Diagnosis not present

## 2017-11-26 DIAGNOSIS — I480 Paroxysmal atrial fibrillation: Secondary | ICD-10-CM | POA: Diagnosis present

## 2017-11-26 DIAGNOSIS — Z7901 Long term (current) use of anticoagulants: Secondary | ICD-10-CM | POA: Diagnosis not present

## 2017-11-26 DIAGNOSIS — E785 Hyperlipidemia, unspecified: Secondary | ICD-10-CM | POA: Diagnosis present

## 2017-11-26 DIAGNOSIS — I483 Typical atrial flutter: Secondary | ICD-10-CM | POA: Diagnosis present

## 2017-11-26 DIAGNOSIS — Z888 Allergy status to other drugs, medicaments and biological substances status: Secondary | ICD-10-CM

## 2017-11-26 DIAGNOSIS — K651 Peritoneal abscess: Secondary | ICD-10-CM

## 2017-11-26 DIAGNOSIS — E559 Vitamin D deficiency, unspecified: Secondary | ICD-10-CM | POA: Diagnosis present

## 2017-11-26 DIAGNOSIS — I255 Ischemic cardiomyopathy: Secondary | ICD-10-CM | POA: Diagnosis present

## 2017-11-26 DIAGNOSIS — I2510 Atherosclerotic heart disease of native coronary artery without angina pectoris: Secondary | ICD-10-CM | POA: Diagnosis present

## 2017-11-26 DIAGNOSIS — D509 Iron deficiency anemia, unspecified: Secondary | ICD-10-CM | POA: Diagnosis present

## 2017-11-26 DIAGNOSIS — N200 Calculus of kidney: Secondary | ICD-10-CM | POA: Diagnosis present

## 2017-11-26 DIAGNOSIS — K5792 Diverticulitis of intestine, part unspecified, without perforation or abscess without bleeding: Secondary | ICD-10-CM

## 2017-11-26 DIAGNOSIS — Z87891 Personal history of nicotine dependence: Secondary | ICD-10-CM

## 2017-11-26 DIAGNOSIS — Z886 Allergy status to analgesic agent status: Secondary | ICD-10-CM | POA: Diagnosis not present

## 2017-11-26 DIAGNOSIS — I4891 Unspecified atrial fibrillation: Secondary | ICD-10-CM | POA: Diagnosis present

## 2017-11-26 DIAGNOSIS — Z951 Presence of aortocoronary bypass graft: Secondary | ICD-10-CM

## 2017-11-26 DIAGNOSIS — K5720 Diverticulitis of large intestine with perforation and abscess without bleeding: Secondary | ICD-10-CM | POA: Diagnosis present

## 2017-11-26 DIAGNOSIS — I252 Old myocardial infarction: Secondary | ICD-10-CM | POA: Diagnosis not present

## 2017-11-26 HISTORY — DX: Diverticulitis of intestine, part unspecified, without perforation or abscess without bleeding: K57.92

## 2017-11-26 HISTORY — DX: Atherosclerotic heart disease of native coronary artery without angina pectoris: I25.10

## 2017-11-26 LAB — URINALYSIS, ROUTINE W REFLEX MICROSCOPIC
Bilirubin Urine: NEGATIVE
Glucose, UA: NEGATIVE mg/dL
KETONES UR: NEGATIVE mg/dL
Leukocytes, UA: NEGATIVE
Nitrite: NEGATIVE
PH: 5 (ref 5.0–8.0)
PROTEIN: NEGATIVE mg/dL
Specific Gravity, Urine: 1.017 (ref 1.005–1.030)

## 2017-11-26 LAB — TROPONIN I: TROPONIN I: 0.03 ng/mL — AB (ref <0.03)

## 2017-11-26 LAB — COMPREHENSIVE METABOLIC PANEL
ALBUMIN: 2.8 g/dL — AB (ref 3.5–5.0)
ALT: 11 U/L — AB (ref 17–63)
ANION GAP: 9 (ref 5–15)
AST: 16 U/L (ref 15–41)
Alkaline Phosphatase: 75 U/L (ref 38–126)
BUN: 14 mg/dL (ref 6–20)
CALCIUM: 8.6 mg/dL — AB (ref 8.9–10.3)
CO2: 23 mmol/L (ref 22–32)
CREATININE: 1.36 mg/dL — AB (ref 0.61–1.24)
Chloride: 101 mmol/L (ref 101–111)
GFR calc Af Amer: 60 mL/min — ABNORMAL LOW (ref >60)
GFR calc non Af Amer: 52 mL/min — ABNORMAL LOW (ref >60)
GLUCOSE: 106 mg/dL — AB (ref 65–99)
Potassium: 3.7 mmol/L (ref 3.5–5.1)
SODIUM: 133 mmol/L — AB (ref 135–145)
Total Bilirubin: 0.7 mg/dL (ref 0.3–1.2)
Total Protein: 7 g/dL (ref 6.5–8.1)

## 2017-11-26 LAB — LIPASE, BLOOD: Lipase: 30 U/L (ref 11–51)

## 2017-11-26 LAB — CBC
HCT: 32.2 % — ABNORMAL LOW (ref 39.0–52.0)
HEMOGLOBIN: 10.2 g/dL — AB (ref 13.0–17.0)
MCH: 27.8 pg (ref 26.0–34.0)
MCHC: 31.7 g/dL (ref 30.0–36.0)
MCV: 87.7 fL (ref 78.0–100.0)
Platelets: 285 10*3/uL (ref 150–400)
RBC: 3.67 MIL/uL — ABNORMAL LOW (ref 4.22–5.81)
RDW: 16.8 % — ABNORMAL HIGH (ref 11.5–15.5)
WBC: 7.1 10*3/uL (ref 4.0–10.5)

## 2017-11-26 LAB — PROTIME-INR
INR: 2.21
PROTHROMBIN TIME: 24.3 s — AB (ref 11.4–15.2)

## 2017-11-26 MED ORDER — SODIUM CHLORIDE 0.9 % IV BOLUS (SEPSIS)
1000.0000 mL | Freq: Once | INTRAVENOUS | Status: AC
  Administered 2017-11-26: 1000 mL via INTRAVENOUS

## 2017-11-26 MED ORDER — BISACODYL 5 MG PO TBEC: 5.0000 mg | DELAYED_RELEASE_TABLET | Freq: Every day | ORAL | Status: DC | PRN

## 2017-11-26 MED ORDER — SODIUM CHLORIDE 0.9 % IV SOLN
INTRAVENOUS | Status: DC
  Administered 2017-11-26 – 2017-12-02 (×5): via INTRAVENOUS

## 2017-11-26 MED ORDER — ONDANSETRON HCL 4 MG/2ML IJ SOLN: 4.0000 mg | Freq: Four times a day (QID) | INTRAMUSCULAR | Status: DC | PRN

## 2017-11-26 MED ORDER — FEBUXOSTAT 40 MG PO TABS
40.0000 mg | ORAL_TABLET | Freq: Every day | ORAL | Status: DC
  Administered 2017-11-26 – 2017-12-04 (×9): 40 mg via ORAL
  Filled 2017-11-26 (×9): qty 1

## 2017-11-26 MED ORDER — HYDROCORTISONE 1 % EX CREA
TOPICAL_CREAM | CUTANEOUS | Status: DC | PRN
  Filled 2017-11-26: qty 28

## 2017-11-26 MED ORDER — PIPERACILLIN-TAZOBACTAM 3.375 G IVPB
3.3750 g | Freq: Three times a day (TID) | INTRAVENOUS | Status: DC
  Administered 2017-11-26 – 2017-12-03 (×20): 3.375 g via INTRAVENOUS
  Filled 2017-11-26 (×22): qty 50

## 2017-11-26 MED ORDER — AMIODARONE HCL 100 MG PO TABS
100.0000 mg | ORAL_TABLET | Freq: Every day | ORAL | Status: DC
  Administered 2017-11-27 – 2017-12-03 (×7): 100 mg via ORAL
  Filled 2017-11-26 (×7): qty 1

## 2017-11-26 MED ORDER — AMIODARONE HCL 200 MG PO TABS: 200.0000 mg | ORAL_TABLET | Freq: Every day | ORAL | Status: DC

## 2017-11-26 MED ORDER — COLCHICINE 0.6 MG PO TABS: 0.6000 mg | ORAL_TABLET | Freq: Every day | ORAL | Status: DC | PRN

## 2017-11-26 MED ORDER — PIPERACILLIN-TAZOBACTAM 3.375 G IVPB 30 MIN
3.3750 g | Freq: Once | INTRAVENOUS | Status: AC
  Administered 2017-11-26: 3.375 g via INTRAVENOUS
  Filled 2017-11-26: qty 50

## 2017-11-26 MED ORDER — ACETAMINOPHEN 325 MG PO TABS
650.0000 mg | ORAL_TABLET | Freq: Four times a day (QID) | ORAL | Status: DC | PRN
  Administered 2017-11-26 – 2017-12-04 (×7): 650 mg via ORAL
  Filled 2017-11-26 (×7): qty 2

## 2017-11-26 MED ORDER — ONDANSETRON HCL 4 MG PO TABS: 4.0000 mg | ORAL_TABLET | Freq: Four times a day (QID) | ORAL | Status: DC | PRN

## 2017-11-26 MED ORDER — ACETAMINOPHEN 650 MG RE SUPP
650.0000 mg | Freq: Four times a day (QID) | RECTAL | Status: DC | PRN
Start: 2017-11-26 — End: 2017-12-04

## 2017-11-26 NOTE — ED Notes (Signed)
Attempted report x1. 

## 2017-11-26 NOTE — ED Notes (Signed)
Dyanne Carrel, NP notified that troponin resulted at 0.03

## 2017-11-26 NOTE — Consult Note (Signed)
Re:   Dean [name] DOB:   1946-08-12 MRN:   [id number]  General Surgery Consultation  Chief Complaint Abdominal pain  ASSESEMENT AND PLAN: 1.  Diverticular abscess  Plan:  IV antibiotics and perc drain of diverticular abscess  Will follow  2.  CAD - Sees Dr. Joaquim Nam for cards  CABG x 4 - 11/02/2017 - Dr. Cyndia Bent  Consider cardiology consult.  He had a follow up office appt on 12/24. 3.  A fib 4.  On coumadin 5.  Ischemic cardiomyopathy - EF 30% 6  History of gout  Sees Dr. Heide Guile at [facility] - this is under much better control 7.  Nephrolithiasis - asymptomatic  Chief Complaint  Patient presents with  . Abdominal Pain   PHYSICIAN REQUESTING CONSULTATION: [name], PA-C  HISTORY OF PRESENT ILLNESS: Dean [name] is a 71 y.o. (DOB: 1945-12-09)  white male whose primary care physician is [name], PA-C.  He admitted with worsening diverticulitis.  He is known to Dr. Fuller Plan, GI.  He has a 2-year history of intermittent left-sided abdominal pain, decrease in appetite, and weight loss.  Much of these symptoms were placed in the background, because he was trying to get control of his gout.  He started seeing Dr. Heide Guile at [facility] and is doing much better with his gout now.  On 10/07/2017 he had a CT scan which showed focally perforated sigmoid diverticulitis.  He was admitted and seen by Dr. Ninfa Linden, discharged on Cipro and Flagyl, and was doing better.  He then bounced back to the [facility] ER with diverticulitis on 10/22/2017 - but was not admitted.  Then he had an MI and required a CABG x 4 on 11/02/2017.  He is doing well from that, except for some pulling in his chest incision.  He has a remote history of stomach ulcer.  No history of liver or pancreas disease.  His last colonoscopy was 2014.  He's had no prior abdominal surgery.   Past Medical History:  Diagnosis Date  . Acid reflux   . Arthropathy   . Atrial fibrillation ([place]) 10/2017  . Coronary artery disease   .  Diverticulitis   . Dizziness   . Fatigue   . Gout   . Hepatitis A age 51  . Hyperlipemia   . Hypogonadism male   . Knee pain   . Night sweats   . Non compliance with medical treatment   . Non-STEMI (non-ST elevated myocardial infarction) ([place]) 10/2017  . Palpitations   . Seasonal allergies   . SOB (shortness of breath)   . Vitamin D deficiency       Past Surgical History:  Procedure Laterality Date  . COLONOSCOPY  ~ 2014   Dr Ferdinand Lango in [facility].  Diverticulosis.  pt denies colon polyps.    . CORONARY ARTERY BYPASS GRAFT N/A 10/26/2017   Procedure: CORONARY ARTERY BYPASS GRAFTING (CABG) x four , using left internal mammary artery and bilateral legs greater saphenous vein harvested endoscopically;  Surgeon: Gaye Pollack, MD;  Location: [facility] OR;  Service: Open Heart Surgery;  Laterality: N/A;  . LEFT HEART CATH AND CORONARY ANGIOGRAPHY N/A 10/25/2017   Procedure: LEFT HEART CATH AND CORONARY ANGIOGRAPHY;  Surgeon: Leonie Man, MD;  Location: [facility];  Service: Cardiovascular;  Laterality: N/A;  . TEE WITHOUT CARDIOVERSION N/A 10/26/2017   Procedure: TRANSESOPHAGEAL ECHOCARDIOGRAM (TEE);  Surgeon: Gaye Pollack, MD;  Location: [facility];  Service: Open Heart Surgery;  Laterality: N/A;  Current Facility-Administered Medications  Medication Dose Route Frequency Provider Last Rate Last Dose  . 0.9 %  sodium chloride infusion   Intravenous Continuous Black, Lezlie Octave, NP      . acetaminophen (TYLENOL) tablet 650 mg  650 mg Oral Q6H PRN Radene Gunning, NP       Or  . acetaminophen (TYLENOL) suppository 650 mg  650 mg Rectal Q6H PRN Radene Gunning, NP      . Derrill Memo ON 11/27/2017] amiodarone (PACERONE) tablet 100 mg  100 mg Oral Daily Black, Karen M, NP      . bisacodyl (DULCOLAX) EC tablet 5 mg  5 mg Oral Daily PRN Black, Karen M, NP      . colchicine tablet 0.6 mg  0.6 mg Oral Daily PRN Black, Karen M, NP      . febuxostat (ULORIC) tablet 40 mg  40 mg Oral Daily  Black, Karen M, NP      . ondansetron [facility]) tablet 4 mg  4 mg Oral Q6H PRN Radene Gunning, NP       Or  . ondansetron [facility]) injection 4 mg  4 mg Intravenous Q6H PRN Black, Karen M, NP      . piperacillin-tazobactam (ZOSYN) IVPB 3.375 g  3.375 g Intravenous Q8H Priscella Mann, [facility]       Current Outpatient Medications  Medication Sig Dispense Refill  . acetaminophen (TYLENOL) 325 MG tablet Take 325 mg every 6 (six) hours as needed by mouth for mild pain.    Marland Kitchen amiodarone (PACERONE) 200 MG tablet Take 1 tablet (200 mg total) by mouth 2 (two) times daily. For one week;then take Amiodarone 200 mg by mouth daily thereafter. (Patient taking differently: Take 100 mg by mouth daily. ) 60 tablet 1  . amoxicillin-clavulanate (AUGMENTIN) 875-125 MG tablet Take 1 tablet by mouth 2 (two) times daily. 60 tablet 0  . aspirin EC 81 MG EC tablet Take 1 tablet (81 mg total) by mouth daily.    . Cholecalciferol (VITAMIN D3) 5000 units CAPS Take 5,000 Units daily by mouth.     . colchicine 0.6 MG tablet Take 0.6 mg by mouth daily as needed (gout).     . febuxostat (ULORIC) 40 MG tablet Take 40 mg by mouth daily.    . hydrocortisone cream 1 % Apply 1 application topically as needed for itching. Do NOT apply to sternal wound 30 g 0  . warfarin (COUMADIN) 1 MG tablet Take 1 tablet (1 mg total) by mouth daily. Or as directed. (Patient taking differently: Take 1 mg by mouth daily. 2 mg on Monday, Wednesday and Friday. 1 mg all other days) 30 tablet 1      Allergies  Allergen Reactions  . Aspirin Other (See Comments)    Will flare up gout Will flare up gout  . Crestor [Rosuvastatin Calcium]     Reported intolerance to Lipitor in the past Crestor 40mg  patient reported muscle aches, dose reduced to rechallenge    REVIEW OF SYSTEMS: Skin:  No history of rash.  No history of abnormal moles. Infection:  No history of hepatitis or HIV.  No history of MRSA. Neurologic:  No history of stroke.  No history of  seizure.   Cardiac:   CAD, CABG x 4 - 11/02/2017 - Dr. Cyndia Bent  A fib  Ischemic cardiomyopathy - EF 30% Pulmonary:  Does not smoke cigarettes.  No asthma or bronchitis.  No OSA/CPAP.  Endocrine:  No diabetes. No thyroid disease. Gastrointestinal:  See HPI Urologic:  Kidney stone seen on CT scan, asymptomatic Musculoskeletal:  Long standing gout, which has been incapacitating at times.  Now under control with Dr. Heide Guile at [facility]. Hematologic:  On coumadin - PT/INR - 24.3/2.2 Psycho-social:  The patient is oriented.   The patient has no obvious psychologic or social impairment to understanding our conversation and plan.  SOCIAL and FAMILY HISTORY: Unmarried. No children. 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  Has lived in [place] for 4 years.  PHYSICAL EXAM: BP (!) 125/54   Pulse (!) 31   Temp (!) 97.5 F (36.4 C) (Oral)   Resp (!) 24   Ht 5' 9.5" (1.765 m)   Wt 68 kg (150 lb)   SpO2 100%   BMI 21.83 kg/m   General: WN WM who is alert and generally healthy appearing.  Skin:  Inspection and palpation - no mass or rash. Eyes:  Conjunctiva and lids unremarkable.            Pupils are equal Ears, Nose, Mouth, and Throat:  Ears and nose unremarkable            Lips and teeth are unremarable. Neck: Supple. No mass, trachea midline.  No thyroid mass. Lymph Nodes:  No supraclavicular, cervical, or inguinal nodes. Lungs: Normal respiratory effort.  Clear to auscultation and symmetric breath sounds. Heart:  Palpation of the heart is normal.            Auscultation: RRR. No murmur or rub.  Healed median sternotomy incision  Abdomen: Soft. No mass. No tenderness. No hernia.             Normal bowel sounds.  No abdominal scars.  Fullness and soreness suprapubic. Rectal: Not done. Musculoskeletal:  Normal gait.            Good muscle strength and ROM  in upper and lower extremities. Neurologic:  Grossly intact to motor and sensory function. Psychiatric: Normal judgement and insight.  Behavior is normal.            Oriented to time, person, place.   DATA REVIEWED, COUNSELING AND COORDINATION OF CARE: Epic notes reviewed. Counseling and coordination of care exceeded more than 50% of the time spent with patient. Total time spent with patient and charting: 45 minutes  Alphonsa Overall, MD,  [facility], [address]  [place], [place]    [place] Phone:  [phone] FAX:  [phone]

## 2017-11-26 NOTE — H&P (Signed)
History and Physical    Dean [name] XVQ:[id number] DOB: 12/25/1945 DOA: 11/26/2017  PCP: [name], PA-C Patient coming from: home  Chief Complaint: abnormal CT  HPI: Dean [name] is a very pleasant 71 y.o. male with medical history significant for recent STEMI status post CABG, A. Fib on Coumadin, sigmoid diverticulitis with small abscesses presents to the emergency Department chief complaint of abnormal abdominal CT. Patient had a follow-up abdominal CT 4 days ago revealing enlargement of the abscesses and persistent diverticulitis. [facility] are asked to admit  Formation is obtained from the patient. He reports he has had intermittent mild to moderate left lower quadrant pain for the last 2 years. At the beginning and no embryo that pain intensified. He had a CT scan at that time which revealed acute sigmoid diverticulitis with small abscesses. He was admitted to the hospital for IV antibiotics was discharged the next day with Cipro and Flagyl. He states that the pain continued he had a repeat CT which showed no significant change. He was evaluated by general surgery sent home on Augmentin. Next day the patient developed chest pain he came to the emergency department was admitted for an NSTEMI had a CABG. He developed A. fib postoperatively discharged on Coumadin. During his hospitalization he was given Zosyn for persistent diverticulitis and again discharged on Augmentin 2 weeks ago. He had a follow-up visit 4 days ago with GI repeat CT scan noted enlargement of abscesses he was called and instructed to come to the emergency department. He denies fever chills nausea vomiting. He does endorse intermittent diarrhea. Denies BRBPR. He denies chest pain palpitation shortness of breath lower extremity edema. He reports he continues to be easily fatigued not much stamina as he had his CABG just 4 and  a half weeks ago.  ED Course: The emergency department he's afebrile hemodynamically  stable and not hypoxic.  Review of Systems: As per HPI otherwise all other systems reviewed and are negative.   Ambulatory Status: Ambulates independently and independent with ADLs  Past Medical History:  Diagnosis Date  . Acid reflux   . Arthropathy   . Atrial fibrillation ([place]) 10/2017  . Coronary artery disease   . Diverticulitis   . Dizziness   . Fatigue   . Gout   . Hepatitis A age 72  . Hyperlipemia   . Hypogonadism male   . Knee pain   . Night sweats   . Non compliance with medical treatment   . Non-STEMI (non-ST elevated myocardial infarction) ([place]) 10/2017  . Palpitations   . Seasonal allergies   . SOB (shortness of breath)   . Vitamin D deficiency     Past Surgical History:  Procedure Laterality Date  . COLONOSCOPY  ~ 2014   Dr Ferdinand Lango in [facility].  Diverticulosis.  pt denies colon polyps.    . CORONARY ARTERY BYPASS GRAFT N/A 10/26/2017   Procedure: CORONARY ARTERY BYPASS GRAFTING (CABG) x four , using left internal mammary artery and bilateral legs greater saphenous vein harvested endoscopically;  Surgeon: Gaye Pollack, MD;  Location: [facility] OR;  Service: Open Heart Surgery;  Laterality: N/A;  . LEFT HEART CATH AND CORONARY ANGIOGRAPHY N/A 10/25/2017   Procedure: LEFT HEART CATH AND CORONARY ANGIOGRAPHY;  Surgeon: Leonie Man, MD;  Location: [facility];  Service: Cardiovascular;  Laterality: N/A;  . TEE WITHOUT CARDIOVERSION N/A 10/26/2017   Procedure: TRANSESOPHAGEAL ECHOCARDIOGRAM (TEE);  Surgeon: Gaye Pollack, MD;  Location: [facility];  Service: Open  Heart Surgery;  Laterality: N/A;    Social History   Socioeconomic History  . Marital status: Single    Spouse name: Not on file  . Number of children: Not on file  . Years of education: Not on file  . Highest education level: Not on file  Social Needs  . Financial resource strain: Not on file  . Food insecurity - worry: Not on file  . Food insecurity - inability: Not on file  .  Transportation needs - medical: Not on file  . Transportation needs - non-medical: Not on file  Occupational History  . Occupation: retired  Tobacco Use  . Smoking status: Former Research scientist (life sciences)  . Smokeless tobacco: Never Used  Substance and Sexual Activity  . Alcohol use: Yes    Alcohol/week: 0.0 oz    Comment: once monthly  . Drug use: No  . Sexual activity: Not on file  Other Topics Concern  . Not on file  Social History Narrative  . Not on file    Allergies  Allergen Reactions  . Aspirin Other (See Comments)    Will flare up gout Will flare up gout  . Crestor [Rosuvastatin Calcium]     Reported intolerance to Lipitor in the past Crestor 40mg  patient reported muscle aches, dose reduced to rechallenge    Family History  Problem Relation Age of Onset  . Hyperlipidemia Mother   . Alzheimer's disease Mother   . Hyperlipidemia Brother   . Alzheimer's disease Maternal Grandmother   . Colon cancer Neg Hx   . Esophageal cancer Neg Hx   . Stomach cancer Neg Hx     Prior to Admission medications   Medication Sig Start Date End Date Taking? Authorizing Provider  acetaminophen (TYLENOL) 325 MG tablet Take 325 mg every 6 (six) hours as needed by mouth for mild pain.   Yes [provider]  amiodarone (PACERONE) 200 MG tablet Take 1 tablet (200 mg total) by mouth 2 (two) times daily. For one week;then take Amiodarone 200 mg by mouth daily thereafter. Patient taking differently: Take 100 mg by mouth daily.  11/06/17  Yes Lars Pinks M, PA-C  amoxicillin-clavulanate (AUGMENTIN) 875-125 MG tablet Take 1 tablet by mouth 2 (two) times daily. 11/25/17  Yes Ester[place], Amy S, PA-C  aspirin EC 81 MG EC tablet Take 1 tablet (81 mg total) by mouth daily. 11/06/17  Yes Lars Pinks M, PA-C  Cholecalciferol (VITAMIN D3) 5000 units CAPS Take 5,000 Units daily by mouth.    Yes [provider]  colchicine 0.6 MG tablet Take 0.6 mg by mouth daily as needed (gout).    Yes  [provider]  febuxostat (ULORIC) 40 MG tablet Take 40 mg by mouth daily.   Yes [provider]  hydrocortisone cream 1 % Apply 1 application topically as needed for itching. Do NOT apply to sternal wound 11/06/17  Yes Lars Pinks M, PA-C  warfarin (COUMADIN) 1 MG tablet Take 1 tablet (1 mg total) by mouth daily. Or as directed. Patient taking differently: Take 1 mg by mouth daily. 2 mg on Monday, Wednesday and Friday. 1 mg all other days 11/06/17  Yes Nani Skillern, [place]    Physical Exam: Vitals:   11/26/17 0830 11/26/17 0845 11/26/17 0900 11/26/17 0915  BP: (!) 131/56 130/74 131/70 (!) 125/54  Pulse: (!) 31 (!) 39 (!) 30 (!) 31  Resp: (!) 23 (!) 25 (!) 21 (!) 24  Temp:      TempSrc:  SpO2: 100% 100% 100% 100%  Weight:      Height:         General:  Appears calm and comfortable sitting up in bed reading Eyes:  PERRL, EOMI, normal lids, iris ENT:  grossly normal hearing, lips & tongue, mucous membranes of his mouth are moist and pink Neck:  no LAD, masses or thyromegaly Cardiovascular:  RRR, no m/r/g. No LE edema. Pedal pulses present and palpable Respiratory:  CTA bilaterally, no w/r/r. Normal respiratory effort. Abdomen:  Nondistended soft very sluggish bowel sounds mild tenderness left lower quadrant to palpation no guarding or rebounding Skin:  no rash or induration seen on limited exam Musculoskeletal:  grossly normal tone BUE/BLE, good ROM, no bony abnormality Psychiatric:  grossly normal mood and affect, speech fluent and appropriate, AOx3 Neurologic:  CN 2-12 grossly intact, moves all extremities in coordinated fashion, sensation intact  Labs on Admission: I have personally reviewed following labs and imaging studies  CBC: Recent Labs  Lab 11/22/17 1124 11/26/17 0812  WBC 8.7 7.1  NEUTROABS 6.6  --   HGB 11.3* 10.2*  HCT 34.5* 32.2*  MCV 87.6 87.7  PLT 367.0 694   Basic Metabolic Panel: Recent Labs  Lab 11/22/17 1124    NA 134*  K 4.0  CL 98  CO2 27  GLUCOSE 100*  BUN 22  CREATININE 1.39  CALCIUM 9.2   GFR: Estimated Creatinine Clearance: 48.2 mL/min (by C-G formula based on SCr of 1.39 mg/dL). Liver Function Tests: No results for input(s): AST, ALT, ALKPHOS, BILITOT, PROT, ALBUMIN in the last 168 hours. No results for input(s): LIPASE, AMYLASE in the last 168 hours. No results for input(s): AMMONIA in the last 168 hours. Coagulation Profile: Recent Labs  Lab 11/26/17 0812  INR 2.21   Cardiac Enzymes: No results for input(s): CKTOTAL, CKMB, CKMBINDEX, TROPONINI in the last 168 hours. BNP (last 3 results) No results for input(s): PROBNP in the last 8760 hours. HbA1C: No results for input(s): HGBA1C in the last 72 hours. CBG: No results for input(s): GLUCAP in the last 168 hours. Lipid Profile: No results for input(s): CHOL, HDL, LDLCALC, TRIG, CHOLHDL, LDLDIRECT in the last 72 hours. Thyroid Function Tests: No results for input(s): TSH, T4TOTAL, FREET4, T3FREE, THYROIDAB in the last 72 hours. Anemia Panel: No results for input(s): VITAMINB12, FOLATE, FERRITIN, TIBC, IRON, RETICCTPCT in the last 72 hours. Urine analysis:    Component Value Date/Time   COLORURINE YELLOW 11/26/2017 0812   APPEARANCEUR HAZY (A) 11/26/2017 0812   LABSPEC 1.017 11/26/2017 0812   PHURINE 5.0 11/26/2017 0812   GLUCOSEU NEGATIVE 11/26/2017 0812   HGBUR SMALL (A) 11/26/2017 0812   BILIRUBINUR NEGATIVE 11/26/2017 0812   KETONESUR NEGATIVE 11/26/2017 0812   PROTEINUR NEGATIVE 11/26/2017 0812   NITRITE NEGATIVE 11/26/2017 0812   LEUKOCYTESUR NEGATIVE 11/26/2017 0812    Creatinine Clearance: Estimated Creatinine Clearance: 48.2 mL/min (by C-G formula based on SCr of 1.39 mg/dL).  Sepsis Labs: @LABRCNTIP (procalcitonin:4,lacticidven:4) )No results found for this or any previous visit (from the past 240 hour(s)).   Radiological Exams on Admission: Ct Abdomen Pelvis W Contrast  Result Date:  11/25/2017 CLINICAL DATA:  Recent diverticulitis.  Abdominal pain persists. EXAM: CT ABDOMEN AND PELVIS WITH CONTRAST TECHNIQUE: Multidetector CT imaging of the abdomen and pelvis was performed using the standard protocol following bolus administration of intravenous contrast. CONTRAST:  112mL ISOVUE-300 IOPAMIDOL (ISOVUE-300) INJECTION 61% COMPARISON:  10/21/2017 FINDINGS: Lower chest: No acute abnormality. Hepatobiliary: Tiny hypodense lesion within the right liver lobe  is too small to characterize, stable compared to the previous study and likely reflects a benign cyst. Diffuse low attenuation of the liver as can be seen with hepatic steatosis. No suspicious mass or lesion within the liver. Gallbladder appears normal. No bile duct dilatation. Pancreas: Unremarkable. No pancreatic ductal dilatation or surrounding inflammatory changes. Spleen: Normal in size without focal abnormality. Adrenals/Urinary Tract: Adrenal glands are unremarkable. Kidneys are normal, without renal calculi, focal lesion, or hydronephrosis. Bladder is unremarkable. Stomach/Bowel: Stomach is within normal limits. Large amount stool throughout the colon. Sigmoid colon diverticulosis with severe bowel wall thickening and perisigmoid ule inflammatory changes. Peridiverticular complex fluid collection with air within the collection and peripheral enhancement measuring 3.8 x 3.5 x 3.9 cm abutting the dome of the bladder. 1.8 cm primarily air-filled abscess or contained perforation along the superior margin of the sigmoid colon with surrounding inflammatory changes. Vascular/Lymphatic: Abdominal aortic atherosclerosis. Infrarenal abdominal aortic ectasia measuring 2.9 x 2.7 cm. 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 Reproductive: Prostate is unremarkable. Other: No abdominal wall hernia or abnormality. No abdominopelvic ascites. Musculoskeletal: No acute  osseous abnormality. No aggressive osseous lesion. IMPRESSION: 1. Persistent diverticulitis of the sigmoid colon. Peridiverticular abscess measuring 3.8 x 3.5 x 3.9 cm abutting the dome of the bladder which has enlarged compared with the prior examination. 2. 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 Recommend followup by ultrasound in 5 years. This recommendation follows ACR consensus guidelines: White Paper of the ACR Incidental Findings Committee II on Vascular Findings. J Am Coll Radiol 2013; 10:789-794. Electronically Signed   By: Kathreen Devoid   On: 11/25/2017 15:19   Portable Chest 1 View  Result Date: 11/26/2017 CLINICAL DATA:  Shortness of Breath EXAM: PORTABLE CHEST 1 VIEW COMPARISON:  10/30/2017 FINDINGS: Prior CABG. Mild cardiomegaly. Lungs clear. No effusions or pneumothorax. No acute bony abnormality. IMPRESSION: No active disease. Electronically Signed   By: Rolm Baptise M.D.   On: 11/26/2017 10:08    EKG: Sinus rhythm Ventricular bigeminy Nonspecific intraventricular conduction delay Borderline repolarization abnormality  Assessment/Plan Principal Problem:   Diverticulitis Active Problems:   Iron deficiency anemia   S/P CABG x 4   Atrial fibrillation ([place]) [I48.91]   #1. Persistent diverticulitis with progressing abscess. CT the abdomen as noted above. Patient is afebrile hemodynamically stable and only in mild pain. No leukocytosis. He is given IV fluids and Zosyn in the emergency department -Admit to telemetry -Clear liquid diet -Continue Zosyn -Supportive therapy -Chest x-ray -urinalysis -Defer management to Gen. Surgery  #2. Atrial fibrillation. New diagnosis. Developed postoperatively from CABG done 4 weeks ago. EKG notes sinus rhythm.chadscore 4. INR 2.2. Meds also include amiodarone noting that patient has changed his dose to 100 mg daily due to side effects his follow-up appointment with cardiovascular surgery is not until January 3 -Hold Coumadin  for now -Continue amiodarone  #3.hx NSTEMI s/p cabg 4 weeks ago. Patient denies chest pain. Initial troponin 0.03. EKG as noted above. -Monitor on telemetry -Home meds as noted above  #4. Iron deficiency anemia. Hemoglobin 10.2. Chart review indicates this is close to baseline. No signs symptoms active bleeding   DVT prophylaxis: scd  Code Status: full  Family Communication: none present  Disposition Plan: home when ready  Consults called: general surgery called by ED provider  Admission status: inpatient    Radene Gunning MD [facility]  If 7PM-7AM, please contact night-coverage [email or web address] Password [email or web address]  11/26/2017, 10:16 AM

## 2017-11-26 NOTE — ED Provider Notes (Signed)
[facility] EMERGENCY DEPARTMENT Provider Note   CSN: [id number] Arrival date & time: 11/26/17  0347     History   Chief Complaint Chief Complaint  Patient presents with  . Abdominal Pain    HPI Emanuel Dowson is a 71 y.o. male.  Pt presents to the ED today with abdominal pain.  Pt was seen initially seen in the ED on 11/2 for LLQ pain.  He had a ct scan which revealed acute sigmoid diverticulitis with small abscesses.  He was admitted overnight and was d/c home the next day on cipro and flagyl.  He was seen by GI [facility] Heckler, Dr. Fuller Plan) on 11/16 with continued LLQ pain.  He had a repeat CT which showed no significant interval change.  He was advised to go back to the ED for admission.  Surgery (Dr. Ninfa Linden) saw him and d/c him home on augmentin as he was not terribly symptomatic.  The pt came back to the ED on 11/19 with CP.  He was admitted for a NSTEMI and had a CABG x4 by Dr. Cyndia Bent.  He developed afib while in the hospital and was d/c home on coumadin.  He was treated with IV zosyn while in the hospital for his diverticulitis and d/c home on 12/2 on Augmentin.  Pt had a follow up visit with GI on 12/18 where a repeat CT scan was ordered for yesterday.  The GI office called him back with the results and told him to come to the hospital as the CT yesterday showed enlargement of the abscess and persistent diverticulitis.       Past Medical History:  Diagnosis Date  . Acid reflux   . Arthropathy   . Atrial fibrillation ([place]) 10/2017  . Coronary artery disease   . Diverticulitis   . Dizziness   . Fatigue   . Gout   . Hepatitis A age 51  . Hyperlipemia   . Hypogonadism male   . Knee pain   . Night sweats   . Non compliance with medical treatment   . Non-STEMI (non-ST elevated myocardial infarction) ([place]) 10/2017  . Palpitations   . Seasonal allergies   . SOB (shortness of breath)   . Vitamin D deficiency     Patient Active Problem List   Diagnosis  Date Noted  . Diverticulitis 11/26/2017  . Atrial fibrillation ([place]) [I48.91] 11/08/2017  . Encounter for therapeutic drug monitoring 11/08/2017  . 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 NSTEMI (non-ST elevated myocardial infarction) ([place]) 10/24/2017  . Diverticulitis of colon with perforation 10/07/2017  . Diarrhea 10/03/2017  . Iron deficiency anemia 10/03/2017  . Loss of weight 10/03/2017  . Left sided abdominal pain of unknown cause 10/03/2017  . Abnormal EKG 07/25/2017  . Chest pain 07/25/2017    Past Surgical History:  Procedure Laterality Date  . COLONOSCOPY  ~ 2014   Dr Ferdinand Lango in [facility].  Diverticulosis.  pt denies colon polyps.    . CORONARY ARTERY BYPASS GRAFT N/A 10/26/2017   Procedure: CORONARY ARTERY BYPASS GRAFTING (CABG) x four , using left internal mammary artery and bilateral legs greater saphenous vein harvested endoscopically;  Surgeon: Gaye Pollack, MD;  Location: [facility] OR;  Service: Open Heart Surgery;  Laterality: N/A;  . LEFT HEART CATH AND CORONARY ANGIOGRAPHY N/A 10/25/2017   Procedure: LEFT HEART CATH AND CORONARY ANGIOGRAPHY;  Surgeon: Leonie Man, MD;  Location: [facility] LAB;  Service: Cardiovascular;  Laterality: N/A;  . TEE WITHOUT  CARDIOVERSION N/A 10/26/2017   Procedure: TRANSESOPHAGEAL ECHOCARDIOGRAM (TEE);  Surgeon: Gaye Pollack, MD;  Location: [facility];  Service: Open Heart Surgery;  Laterality: N/A;       Home Medications    Prior to Admission medications   Medication Sig Start Date End Date Taking? Authorizing Provider  acetaminophen (TYLENOL) 325 MG tablet Take 325 mg every 6 (six) hours as needed by mouth for mild pain.   Yes [provider]  amiodarone (PACERONE) 200 MG tablet Take 1 tablet (200 mg total) by mouth 2 (two) times daily. For one week;then take Amiodarone 200 mg by mouth daily thereafter. Patient taking differently: Take 100 mg by mouth daily.  11/06/17  Yes Lars Pinks M, PA-C  amoxicillin-clavulanate  (AUGMENTIN) 875-125 MG tablet Take 1 tablet by mouth 2 (two) times daily. 11/25/17  Yes Esterwood, Amy S, PA-C  aspirin EC 81 MG EC tablet Take 1 tablet (81 mg total) by mouth daily. 11/06/17  Yes Lars Pinks M, PA-C  Cholecalciferol (VITAMIN D3) 5000 units CAPS Take 5,000 Units daily by mouth.    Yes [provider]  colchicine 0.6 MG tablet Take 0.6 mg by mouth daily as needed (gout).    Yes [provider]  febuxostat (ULORIC) 40 MG tablet Take 40 mg by mouth daily.   Yes [provider]  hydrocortisone cream 1 % Apply 1 application topically as needed for itching. Do NOT apply to sternal wound 11/06/17  Yes Lars Pinks M, PA-C  warfarin (COUMADIN) 1 MG tablet Take 1 tablet (1 mg total) by mouth daily. Or as directed. Patient taking differently: Take 1 mg by mouth daily. 2 mg on Monday, Wednesday and Friday. 1 mg all other days 11/06/17  Yes Nani Skillern, PA-C    Family History Family History  Problem Relation Age of Onset  . Hyperlipidemia Mother   . Alzheimer's disease Mother   . Hyperlipidemia Brother   . Alzheimer's disease Maternal Grandmother   . Colon cancer Neg Hx   . Esophageal cancer Neg Hx   . Stomach cancer Neg Hx     Social History Social History   Tobacco Use  . Smoking status: Former Research scientist (life sciences)  . Smokeless tobacco: Never Used  Substance Use Topics  . Alcohol use: Yes    Alcohol/week: 0.0 oz    Comment: once monthly  . Drug use: No     Allergies   Aspirin and Crestor [rosuvastatin calcium]   Review of Systems Review of Systems  Gastrointestinal: Positive for abdominal pain and nausea.  All other systems reviewed and are negative.    Physical Exam Updated Vital Signs BP 131/70   Pulse (!) 30   Temp (!) 97.5 F (36.4 C) (Oral)   Resp (!) 21   Ht 5' 9.5" (1.765 m)   Wt 68 kg (150 lb)   SpO2 100%   BMI 21.83 kg/m   Physical Exam  Constitutional: He is oriented to person, place, and time. He appears  well-developed and well-nourished.  HENT:  Head: Normocephalic and atraumatic.  Mouth/Throat: Oropharynx is clear and moist.  Eyes: EOM are normal. Pupils are equal, round, and reactive to light.  Cardiovascular: Normal rate, regular rhythm, normal heart sounds and intact distal pulses.  Pulmonary/Chest: Effort normal and breath sounds normal.  Abdominal: Soft. Normal appearance and bowel sounds are normal. There is tenderness in the left lower quadrant.  Neurological: He is alert and oriented to person, place, and time.  Skin: Skin is warm. Capillary  refill takes less than 2 seconds.  Psychiatric: He has a normal mood and affect. His behavior is normal.  Nursing note and vitals reviewed.    ED Treatments / Results  Labs (all labs ordered are listed, but only abnormal results are displayed) Labs Reviewed  CBC - Abnormal; Notable for the following components:      Result Value   RBC 3.67 (*)    Hemoglobin 10.2 (*)    HCT 32.2 (*)    RDW 16.8 (*)    All other components within normal limits  URINALYSIS, ROUTINE W REFLEX MICROSCOPIC - Abnormal; Notable for the following components:   APPearance HAZY (*)    Hgb urine dipstick SMALL (*)    Bacteria, UA RARE (*)    Squamous Epithelial / LPF 0-5 (*)    All other components within normal limits  PROTIME-INR - Abnormal; Notable for the following components:   Prothrombin Time 24.3 (*)    All other components within normal limits  LIPASE, BLOOD  COMPREHENSIVE METABOLIC PANEL  TROPONIN I    EKG  EKG Interpretation  Date/Time:  Saturday November 26 2017 08:15:01 EST Ventricular Rate:  74 PR Interval:    QRS Duration: 125 QT Interval:  423 QTC Calculation: 370 R Axis:   -30 Text Interpretation:  Sinus rhythm Ventricular bigeminy Nonspecific intraventricular conduction delay Borderline repolarization abnormality no longer in a.fib bigeminy is new Confirmed by Isla Pence [phone]) on 11/26/2017 8:24:13 AM        Radiology Ct Abdomen Pelvis W Contrast  Result Date: 11/25/2017 CLINICAL DATA:  Recent diverticulitis.  Abdominal pain persists. EXAM: CT ABDOMEN AND PELVIS WITH CONTRAST TECHNIQUE: Multidetector CT imaging of the abdomen and pelvis was performed using the standard protocol following bolus administration of intravenous contrast. CONTRAST:  165mL ISOVUE-300 IOPAMIDOL (ISOVUE-300) INJECTION 61% COMPARISON:  10/21/2017 FINDINGS: Lower chest: No acute abnormality. Hepatobiliary: Tiny hypodense lesion within the right liver lobe is too small to characterize, stable compared to the previous study and likely reflects a benign cyst. Diffuse low attenuation of the liver as can be seen with hepatic steatosis. No suspicious mass or lesion within the liver. Gallbladder appears normal. No bile duct dilatation. Pancreas: Unremarkable. No pancreatic ductal dilatation or surrounding inflammatory changes. Spleen: Normal in size without focal abnormality. Adrenals/Urinary Tract: Adrenal glands are unremarkable. Kidneys are normal, without renal calculi, focal lesion, or hydronephrosis. Bladder is unremarkable. Stomach/Bowel: Stomach is within normal limits. Large amount stool throughout the colon. Sigmoid colon diverticulosis with severe bowel wall thickening and perisigmoid ule inflammatory changes. Peridiverticular complex fluid collection with air within the collection and peripheral enhancement measuring 3.8 x 3.5 x 3.9 cm abutting the dome of the bladder. 1.8 cm primarily air-filled abscess or contained perforation along the superior margin of the sigmoid colon with surrounding inflammatory changes. Vascular/Lymphatic: Abdominal aortic atherosclerosis. Infrarenal abdominal aortic ectasia measuring 2.9 x 2.7 cm. 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 Reproductive: Prostate is unremarkable. Other: No abdominal wall hernia or  abnormality. No abdominopelvic ascites. Musculoskeletal: No acute osseous abnormality. No aggressive osseous lesion. IMPRESSION: 1. Persistent diverticulitis of the sigmoid colon. Peridiverticular abscess measuring 3.8 x 3.5 x 3.9 cm abutting the dome of the bladder which has enlarged compared with the prior examination. 2. 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 Recommend followup by ultrasound in 5 years. This recommendation follows ACR consensus guidelines: White Paper of the ACR Incidental Findings Committee II  on Vascular Findings. J Am Coll Radiol 2013; 10:789-794. Electronically Signed   By: Kathreen Devoid   On: 11/25/2017 15:19    Procedures Procedures (including critical care time)  Medications Ordered in ED Medications  sodium chloride 0.9 % bolus 1,000 mL (1,000 mLs Intravenous New Bag/Given 11/26/17 0827)  piperacillin-tazobactam (ZOSYN) IVPB 3.375 g (0 g Intravenous Stopped 11/26/17 0903)     Initial Impression / Assessment and Plan / ED Course  I have reviewed the triage vital signs and the nursing notes.  Pertinent labs & imaging results that were available during my care of the patient were reviewed by me and considered in my medical decision making (see chart for details).    Pt d/w surgery who will see pt in consult, but due to multiple medical problems, request hospitalist admission.  I spoke with [facility] hospitalists who will admit.    Final Clinical Impressions(s) / ED Diagnoses   Final diagnoses:  Acute diverticulitis  Abscess of abdominal cavity [facility])    ED Discharge Orders    None       Isla Pence, MD 11/26/17 [phone]

## 2017-11-26 NOTE — ED Triage Notes (Signed)
Patient reports worsening mid abdominal pain with mild nausea and diarrhea for several days , denies fever or chills .

## 2017-11-26 NOTE — Progress Notes (Signed)
Pharmacy Antibiotic Note  Dean [name] is a 71 y.o. male admitted on 11/26/2017 with persistent diverticulitis and enlarged abscesses on abdominal CT.  Pharmacy has been consulted for Zosyn dosing. Patient has had prior courses of Cipro + Flagyl and Augmentin over the last month. Patient received Zosyn 3.375g IV x1 in ED at 08:27AM.   Plan: Zosyn 3.375g IV every 8 hours- extended infusion.  Monitor renal function, culture results, and clinical status.   Height: 5' 9.5" (176.5 cm) Weight: 150 lb (68 kg) IBW/kg (Calculated) : 71.85  Temp (24hrs), Avg:97.5 F (36.4 C), Min:97.5 F (36.4 C), Max:97.5 F (36.4 C)  Recent Labs  Lab 11/22/17 1124 11/26/17 0812  WBC 8.7 7.1  CREATININE 1.39  --     Estimated Creatinine Clearance: 48.2 mL/min (by C-G formula based on SCr of 1.39 mg/dL).    Allergies  Allergen Reactions  . Aspirin Other (See Comments)    Will flare up gout Will flare up gout  . Crestor [Rosuvastatin Calcium]     Reported intolerance to Lipitor in the past Crestor 40mg  patient reported muscle aches, dose reduced to rechallenge    Antimicrobials this admission: Zosyn 12/22 >>  Dose adjustments this admission:   Microbiology results: none  Thank you for allowing pharmacy to be a part of this patient's care.  Sloan Leiter, PharmD, BCPS, BCCCP Clinical Pharmacist Clinical phone 11/26/2017 until 3:[phone] After hours, please call #[phone] 11/26/2017 10:03 AM

## 2017-11-27 DIAGNOSIS — K5720 Diverticulitis of large intestine with perforation and abscess without bleeding: Secondary | ICD-10-CM | POA: Diagnosis present

## 2017-11-27 DIAGNOSIS — I480 Paroxysmal atrial fibrillation: Secondary | ICD-10-CM

## 2017-11-27 DIAGNOSIS — R0602 Shortness of breath: Secondary | ICD-10-CM

## 2017-11-27 DIAGNOSIS — K5792 Diverticulitis of intestine, part unspecified, without perforation or abscess without bleeding: Secondary | ICD-10-CM

## 2017-11-27 DIAGNOSIS — K651 Peritoneal abscess: Secondary | ICD-10-CM

## 2017-11-27 DIAGNOSIS — Z951 Presence of aortocoronary bypass graft: Secondary | ICD-10-CM

## 2017-11-27 LAB — PROTIME-INR
INR: 2.37
Prothrombin Time: 25.7 seconds — ABNORMAL HIGH (ref 11.4–15.2)

## 2017-11-27 LAB — BASIC METABOLIC PANEL
Anion gap: 8 (ref 5–15)
BUN: 11 mg/dL (ref 6–20)
CO2: 25 mmol/L (ref 22–32)
Calcium: 9 mg/dL (ref 8.9–10.3)
Chloride: 102 mmol/L (ref 101–111)
Creatinine, Ser: 1.13 mg/dL (ref 0.61–1.24)
GFR calc Af Amer: >60 mL/min (ref >60)
GLUCOSE: 105 mg/dL — AB (ref 65–99)
POTASSIUM: 3.6 mmol/L (ref 3.5–5.1)
Sodium: 135 mmol/L (ref 135–145)

## 2017-11-27 LAB — MAGNESIUM: Magnesium: 1.9 mg/dL (ref 1.7–2.4)

## 2017-11-27 LAB — CBC
HEMATOCRIT: 33.7 % — AB (ref 39.0–52.0)
Hemoglobin: 10.7 g/dL — ABNORMAL LOW (ref 13.0–17.0)
MCH: 28 pg (ref 26.0–34.0)
MCHC: 31.8 g/dL (ref 30.0–36.0)
MCV: 88.2 fL (ref 78.0–100.0)
Platelets: 325 10*3/uL (ref 150–400)
RBC: 3.82 MIL/uL — ABNORMAL LOW (ref 4.22–5.81)
RDW: 16.9 % — AB (ref 11.5–15.5)
WBC: 5.9 10*3/uL (ref 4.0–10.5)

## 2017-11-27 MED ORDER — POTASSIUM CHLORIDE CRYS ER 20 MEQ PO TBCR
40.0000 meq | EXTENDED_RELEASE_TABLET | Freq: Once | ORAL | Status: AC
  Administered 2017-11-27: 40 meq via ORAL
  Filled 2017-11-27: qty 2

## 2017-11-27 NOTE — Progress Notes (Signed)
[facility] Office:  [phone] General Surgery Progress Note   LOS: 1 day  POD -     Chief Complaint: Abdominal pain  Assessment and Plan: 1.  Diverticular abscess             On Zosyn - 12/22 >>>  WBC - 5,900 - 11/27/2017  Plan:  perc drain of diverticular abscess  On clear liquids I don't see where has been seen by anyone except me since admission - will need orders to check/follow INR and for perc drain of abscess.  2.  CAD - Sees Dr. Joaquim Nam for cards             CABG x 4 - 11/02/2017 - Dr. Cyndia Bent  Had some rhythm issues last PM, cardiology has been consulted. 3.  A fib 4.  On coumadin  On hold for perc drain 5.  Ischemic cardiomyopathy - EF 30% 6  History of gout             Sees Dr. Heide Guile at [facility] - this is under much better control 7.  Nephrolithiasis - asymptomatic 8. DVT prophylaxis - on hold   Principal Problem:   Diverticulitis Active Problems:   Iron deficiency anemia   S/P CABG x 4   Atrial fibrillation ([facility]) [I48.91]   Subjective:  Minimal abdominal symptoms.  Tired of clear liquids.  Otherwise okay.  Objective:   Vitals:   11/26/17 2056 11/27/17 0354  BP: 121/65 135/67  Pulse: (!) 58 (!) 58  Resp: 19 20  Temp: 98.6 F (37 C) 98.3 F (36.8 C)  SpO2: 96% 98%     Intake/Output from previous day:  12/22 0701 - 12/23 0700 In: 2710 [P.O.:960; I.V.:600; IV Piggyback:1150] Out: 475 [Urine:475]  Intake/Output this shift:  No intake/output data recorded.   Physical Exam:   General: WN WM who is alert and oriented.    HEENT: Normal. Pupils equal.   Abdomen: Soft   Lab Results:    Recent Labs    11/26/17 0812 11/27/17 0654  WBC 7.1 5.9  HGB 10.2* 10.7*  HCT 32.2* 33.7*  PLT 285 325    BMET   Recent Labs    11/26/17 0812  NA 133*  K 3.7  CL 101  CO2 23  GLUCOSE 106*  BUN 14  CREATININE 1.36*  CALCIUM 8.6*    PT/INR   Recent Labs    11/26/17 0812  LABPROT 24.3*  INR 2.21    ABG  No results for  input(s): PHART, HCO3 in the last 72 hours.  Invalid input(s): PCO2, PO2   Studies/Results:  Ct Abdomen Pelvis W Contrast  Result Date: 11/25/2017 CLINICAL DATA:  Recent diverticulitis.  Abdominal pain persists. EXAM: CT ABDOMEN AND PELVIS WITH CONTRAST TECHNIQUE: Multidetector CT imaging of the abdomen and pelvis was performed using the standard protocol following bolus administration of intravenous contrast. CONTRAST:  151mL ISOVUE-300 IOPAMIDOL (ISOVUE-300) INJECTION 61% COMPARISON:  10/21/2017 FINDINGS: Lower chest: No acute abnormality. Hepatobiliary: Tiny hypodense lesion within the right liver lobe is too small to characterize, stable compared to the previous study and likely reflects a benign cyst. Diffuse low attenuation of the liver as can be seen with hepatic steatosis. No suspicious mass or lesion within the liver. Gallbladder appears normal. No bile duct dilatation. Pancreas: Unremarkable. No pancreatic ductal dilatation or surrounding inflammatory changes. Spleen: Normal in size without focal abnormality. Adrenals/Urinary Tract: Adrenal glands are unremarkable. Kidneys are normal, without renal calculi, focal lesion, or hydronephrosis. Bladder  is unremarkable. Stomach/Bowel: Stomach is within normal limits. Large amount stool throughout the colon. Sigmoid colon diverticulosis with severe bowel wall thickening and perisigmoid ule inflammatory changes. Peridiverticular complex fluid collection with air within the collection and peripheral enhancement measuring 3.8 x 3.5 x 3.9 cm abutting the dome of the bladder. 1.8 cm primarily air-filled abscess or contained perforation along the superior margin of the sigmoid colon with surrounding inflammatory changes. Vascular/Lymphatic: Abdominal aortic atherosclerosis. Infrarenal abdominal aortic ectasia measuring 2.9 x 2.7 cm. 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  Reproductive: Prostate is unremarkable. Other: No abdominal wall hernia or abnormality. No abdominopelvic ascites. Musculoskeletal: No acute osseous abnormality. No aggressive osseous lesion. IMPRESSION: 1. Persistent diverticulitis of the sigmoid colon. Peridiverticular abscess measuring 3.8 x 3.5 x 3.9 cm abutting the dome of the bladder which has enlarged compared with the prior examination. 2. 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 Recommend followup by ultrasound in 5 years. This recommendation follows ACR consensus guidelines: White Paper of the ACR Incidental Findings Committee II on Vascular Findings. J Am Coll Radiol 2013; 10:789-794. Electronically Signed   By: Kathreen Devoid   On: 11/25/2017 15:19   Portable Chest 1 View  Result Date: 11/26/2017 CLINICAL DATA:  Shortness of Breath EXAM: PORTABLE CHEST 1 VIEW COMPARISON:  10/30/2017 FINDINGS: Prior CABG. Mild cardiomegaly. Lungs clear. No effusions or pneumothorax. No acute bony abnormality. IMPRESSION: No active disease. Electronically Signed   By: Rolm Baptise M.D.   On: 11/26/2017 10:08     Anti-infectives:   Anti-infectives (From admission, onward)   Start     Dose/Rate Route Frequency Ordered Stop   11/26/17 1630  piperacillin-tazobactam (ZOSYN) IVPB 3.375 g     3.375 g 12.5 mL/hr over 240 Minutes Intravenous Every 8 hours 11/26/17 1032     11/26/17 0800  piperacillin-tazobactam (ZOSYN) IVPB 3.375 g     3.375 g 100 mL/hr over 30 Minutes Intravenous  Once 11/26/17 0755 11/26/17 0903      Alphonsa Overall, MD, FACS Pager: [facility] Office: [phone] 11/27/2017

## 2017-11-27 NOTE — Progress Notes (Signed)
Alerted by Central Telemetry Monitoring that patient's HR was going as low as 37 BPM.  Also, rhythm switching between SR/SB to Atrial Flutter.  Also, had several pauses, as high as 1.8 sec.  Upon assessment, patient is awake and  In no distress.  He did state that several times he could feel his heart beating through his chest but no chest pressure or tightness.  EKG done.  VS stable.  [facility] called and made aware.  Will continue to monitor patient.  Earleen Reaper RN-BC, [employer]

## 2017-11-27 NOTE — Consult Note (Signed)
Cardiology Consultation:   Patient ID: Dean [name]; [id number]; 1946/09/08   Admit date: 11/26/2017 Date of Consult: 11/27/2017  Primary Care Provider: [name], PA-C Primary Cardiologist: Dr. Acie Fredrickson Primary Electrophysiologist: None   Patient Profile:   Dean [name] is a 71 y.o. male with a hx of coronary artery disease, atrial fibrillation, ischemic cardiomyopathy who is being seen today for the evaluation of atrial fibrillation at the request of Dr. Grandville Silos.  History of Present Illness:   [name] 71 year old male with known coronary artery disease with CABG x4 on 11/02/17 by Dr. Cyndia Bent with history of atrial fibrillation on chronic Coumadin which is currently on hold for possible percutaneous drain, ischemic cardiomyopathy EF 30% with new diverticular abscess noted currently on Zosyn with percutaneous drain of diverticular abscess being seen for the evaluation of atrial fibrillation.  In review of nursing notes at 4:06 AM at 1223, telemetry monitoring alerted to heart rate as low as 37 bpm.  The rhythm apparently was switching between sinus rhythm and sinus bradycardia to atrial flutter.  Had several pauses as high as 1.8 seconds.  I was able to personally reviewed telemetry and he did have a brief episode of atrial flutter, typical appearing pattern with rate control.  Pauses were not significant.  Sinus bradycardia mostly noted especially through the night.  Heart rate in the 40s occasionally.  He was awake, in no distress.  He did state that several times he can feel his heart beating through his chest but no chest pressure or tightness.  In review of his chart during his recent bypass surgery hospital stay, he did have episodes of PAF, variability with some bradycardia, first-degree heart block noted.  He was on amiodarone 200 mg twice a day but he decided to decrease his amiodarone use because he was having abnormal taste sensation.  He states that he has difficulty with  high-dose medications in general.  He stopped taking his statin several years ago because of feeling poorly.  He also told me about his gout which had been treated at [facility].  Feeling better.    Past Medical History:  Diagnosis Date  . Acid reflux   . Arthropathy   . Atrial fibrillation ([place]) 10/2017  . Coronary artery disease   . Diverticulitis   . Dizziness   . Fatigue   . Gout   . Hepatitis A age 53  . Hyperlipemia   . Hypogonadism male   . Knee pain   . Night sweats   . Non compliance with medical treatment   . Non-STEMI (non-ST elevated myocardial infarction) ([place]) 10/2017  . Palpitations   . Seasonal allergies   . SOB (shortness of breath)   . Vitamin D deficiency     Past Surgical History:  Procedure Laterality Date  . COLONOSCOPY  ~ 2014   Dr Ferdinand Lango in [facility].  Diverticulosis.  pt denies colon polyps.    . CORONARY ARTERY BYPASS GRAFT N/A 10/26/2017   Procedure: CORONARY ARTERY BYPASS GRAFTING (CABG) x four , using left internal mammary artery and bilateral legs greater saphenous vein harvested endoscopically;  Surgeon: Gaye Pollack, MD;  Location: [facility] OR;  Service: Open Heart Surgery;  Laterality: N/A;  . LEFT HEART CATH AND CORONARY ANGIOGRAPHY N/A 10/25/2017   Procedure: LEFT HEART CATH AND CORONARY ANGIOGRAPHY;  Surgeon: Leonie Man, MD;  Location: [facility] CV LAB;  Service: Cardiovascular;  Laterality: N/A;  . TEE WITHOUT CARDIOVERSION N/A 10/26/2017   Procedure: TRANSESOPHAGEAL ECHOCARDIOGRAM (TEE);  Surgeon:  Gaye Pollack, MD;  Location: [facility] OR;  Service: Open Heart Surgery;  Laterality: N/A;     Home Medications:  Prior to Admission medications   Medication Sig Start Date End Date Taking? Authorizing Provider  acetaminophen (TYLENOL) 325 MG tablet Take 325 mg every 6 (six) hours as needed by mouth for mild pain.   Yes [provider]  amiodarone (PACERONE) 200 MG tablet Take 1 tablet (200 mg total) by mouth 2 (two) times daily. For one  week;then take Amiodarone 200 mg by mouth daily thereafter. Patient taking differently: Take 100 mg by mouth daily.  11/06/17  Yes Lars Pinks M, PA-C  amoxicillin-clavulanate (AUGMENTIN) 875-125 MG tablet Take 1 tablet by mouth 2 (two) times daily. 11/25/17  Yes Esterwood, Amy S, PA-C  aspirin EC 81 MG EC tablet Take 1 tablet (81 mg total) by mouth daily. 11/06/17  Yes Lars Pinks M, PA-C  Cholecalciferol (VITAMIN D3) 5000 units CAPS Take 5,000 Units daily by mouth.    Yes [provider]  colchicine 0.6 MG tablet Take 0.6 mg by mouth daily as needed (gout).    Yes [provider]  febuxostat (ULORIC) 40 MG tablet Take 40 mg by mouth daily.   Yes [provider]  hydrocortisone cream 1 % Apply 1 application topically as needed for itching. Do NOT apply to sternal wound 11/06/17  Yes Lars Pinks M, PA-C  warfarin (COUMADIN) 1 MG tablet Take 1 tablet (1 mg total) by mouth daily. Or as directed. Patient taking differently: Take 1 mg by mouth daily. 2 mg on Monday, Wednesday and Friday. 1 mg all other days 11/06/17  Yes Nani Skillern, PA-C    Inpatient Medications: Scheduled Meds: . amiodarone  100 mg Oral Daily  . febuxostat  40 mg Oral Daily   Continuous Infusions: . sodium chloride 50 mL/hr at 11/26/17 1209  . piperacillin-tazobactam (ZOSYN)  IV 3.375 g (11/27/17 0855)   PRN Meds: acetaminophen **OR** acetaminophen, bisacodyl, colchicine, hydrocortisone cream, ondansetron **OR** ondansetron (ZOFRAN) IV  Allergies:    Allergies  Allergen Reactions  . Aspirin Other (See Comments)    Will flare up gout Will flare up gout  . Crestor [Rosuvastatin Calcium]     Reported intolerance to Lipitor in the past Crestor 40mg  patient reported muscle aches, dose reduced to rechallenge    Social History:   Social History   Socioeconomic History  . Marital status: Single    Spouse name: Not on file  . Number of children: Not on file  .  Years of education: Not on file  . Highest education level: Not on file  Social Needs  . Financial resource strain: Not on file  . Food insecurity - worry: Not on file  . Food insecurity - inability: Not on file  . Transportation needs - medical: Not on file  . Transportation needs - non-medical: Not on file  Occupational History  . Occupation: retired  Tobacco Use  . Smoking status: Former Research scientist (life sciences)  . Smokeless tobacco: Never Used  Substance and Sexual Activity  . Alcohol use: Yes    Alcohol/week: 0.0 oz    Comment: once monthly  . Drug use: No  . Sexual activity: Not on file  Other Topics Concern  . Not on file  Social History Narrative  . Not on file    Family History:    Family History  Problem Relation Age of Onset  . Hyperlipidemia Mother   . Alzheimer's disease Mother   .  Hyperlipidemia Brother   . Alzheimer's disease Maternal Grandmother   . Colon cancer Neg Hx   . Esophageal cancer Neg Hx   . Stomach cancer Neg Hx      ROS:  Please see the history of present illness.  ROS  All other ROS reviewed and negative.     Physical Exam/Data:   Vitals:   11/26/17 1712 11/26/17 2056 11/27/17 0354 11/27/17 1046  BP: 125/70 121/65 135/67 136/73  Pulse: 63 (!) 58 (!) 58 62  Resp: 18 19 20 16   Temp: 98.2 F (36.8 C) 98.6 F (37 C) 98.3 F (36.8 C) (!) 97.5 F (36.4 C)  TempSrc: Oral Oral Oral Oral  SpO2: 100% 96% 98% 100%  Weight:  150 lb 5.7 oz (68.2 kg)    Height:        Intake/Output Summary (Last 24 hours) at 11/27/2017 1150 Last data filed at 11/27/2017 1054 Gross per 24 hour  Intake 1900 ml  Output 625 ml  Net 1275 ml   Filed Weights   11/26/17 0644 11/26/17 2056  Weight: 150 lb (68 kg) 150 lb 5.7 oz (68.2 kg)   Body mass index is 21.89 kg/m.  General:  Well nourished, well developed, in no acute distress HEENT: normal Lymph: no adenopathy Neck: no JVD Endocrine:  No thryomegaly Vascular: No carotid bruits; FA pulses 2+ bilaterally without  bruits  Cardiac:  normal S1, S2; RRR; no murmur CABG scar well healing. Lungs:  clear to auscultation bilaterally, no wheezing, rhonchi or rales  Abd: soft, nontender, no hepatomegaly Ext: no edema, lower extremity vein extraction scar is well-healing. Musculoskeletal:  No deformities, BUE and BLE strength normal and equal Skin: warm and dry  Neuro:  CNs 2-12 intact, no focal abnormalities noted Psych:  Normal affect   EKG:  The EKG was personally reviewed and demonstrates: Sinus bradycardia with occasional PVCs, heart rate 50.  Telemetry:  Telemetry was personally reviewed and demonstrates: Paroxysmal atrial flutter noted likely typical flutter with normal heart rate response.  Occasional pauses, none greater than 1.8 seconds.  Sinus bradycardia throughout evening, usually in the 40s-50s.  Asymptomatic.  Occasional PVCs, rare bigeminal pattern.  Relevant CV Studies:  ECHO 10/25/17 - Left ventricle: The cavity size was mildly dilated. Systolic   function was moderately to severely reduced. The estimated   ejection fraction was in the range of 30% to 35%. Severe   hypokinesis of the anterolateral, inferolateral, and inferior   myocardium; consistent with ischemia in the distribution of the   right coronary and left circumflex coronary artery. Features are   consistent with a pseudonormal left ventricular filling pattern,   with concomitant abnormal relaxation and increased filling   pressure (grade 2 diastolic dysfunction). - Aortic valve: There was mild regurgitation. - Mitral valve: There was mild to moderate regurgitation directed   centrally. The acceleration rate of the regurgitant jet was   reduced, consistent with a low dP/dt. - Left atrium: The atrium was mildly to moderately dilated.  Impressions:  - Regional wall motion abnormalities and decreased LV systolic   function are new since August 2018.  Laboratory Data:  Chemistry Recent Labs  Lab 11/22/17 1124  11/26/17 0812 11/27/17 0654  NA 134* 133* 135  K 4.0 3.7 3.6  CL 98 101 102  CO2 27 23 25   GLUCOSE 100* 106* 105*  BUN 22 14 11   CREATININE 1.39 1.36* 1.13  CALCIUM 9.2 8.6* 9.0  GFRNONAA  --  52* >60  GFRAA  --  60* >60  ANIONGAP  --  9 8    Recent Labs  Lab 11/26/17 0812  PROT 7.0  ALBUMIN 2.8*  AST 16  ALT 11*  ALKPHOS 75  BILITOT 0.7   Hematology Recent Labs  Lab 11/22/17 1124 11/26/17 0812 11/27/17 0654  WBC 8.7 7.1 5.9  RBC 3.94* 3.67* 3.82*  HGB 11.3* 10.2* 10.7*  HCT 34.5* 32.2* 33.7*  MCV 87.6 87.7 88.2  MCH  --  27.8 28.0  MCHC 32.7 31.7 31.8  RDW 17.8* 16.8* 16.9*  PLT 367.0 285 325   Cardiac Enzymes Recent Labs  Lab 11/26/17 0812  TROPONINI 0.03*   No results for input(s): TROPIPOC in the last 168 hours.  BNPNo results for input(s): BNP, PROBNP in the last 168 hours.  DDimer No results for input(s): DDIMER in the last 168 hours.  Radiology/Studies:  Ct Abdomen Pelvis W Contrast  Result Date: 11/25/2017 CLINICAL DATA:  Recent diverticulitis.  Abdominal pain persists. EXAM: CT ABDOMEN AND PELVIS WITH CONTRAST TECHNIQUE: Multidetector CT imaging of the abdomen and pelvis was performed using the standard protocol following bolus administration of intravenous contrast. CONTRAST:  128mL ISOVUE-300 IOPAMIDOL (ISOVUE-300) INJECTION 61% COMPARISON:  10/21/2017 FINDINGS: Lower chest: No acute abnormality. Hepatobiliary: Tiny hypodense lesion within the right liver lobe is too small to characterize, stable compared to the previous study and likely reflects a benign cyst. Diffuse low attenuation of the liver as can be seen with hepatic steatosis. No suspicious mass or lesion within the liver. Gallbladder appears normal. No bile duct dilatation. Pancreas: Unremarkable. No pancreatic ductal dilatation or surrounding inflammatory changes. Spleen: Normal in size without focal abnormality. Adrenals/Urinary Tract: Adrenal glands are unremarkable. Kidneys are normal,  without renal calculi, focal lesion, or hydronephrosis. Bladder is unremarkable. Stomach/Bowel: Stomach is within normal limits. Large amount stool throughout the colon. Sigmoid colon diverticulosis with severe bowel wall thickening and perisigmoid ule inflammatory changes. Peridiverticular complex fluid collection with air within the collection and peripheral enhancement measuring 3.8 x 3.5 x 3.9 cm abutting the dome of the bladder. 1.8 cm primarily air-filled abscess or contained perforation along the superior margin of the sigmoid colon with surrounding inflammatory changes. Vascular/Lymphatic: Abdominal aortic atherosclerosis. Infrarenal abdominal aortic ectasia measuring 2.9 x 2.7 cm. 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 Reproductive: Prostate is unremarkable. Other: No abdominal wall hernia or abnormality. No abdominopelvic ascites. Musculoskeletal: No acute osseous abnormality. No aggressive osseous lesion. IMPRESSION: 1. Persistent diverticulitis of the sigmoid colon. Peridiverticular abscess measuring 3.8 x 3.5 x 3.9 cm abutting the dome of the bladder which has enlarged compared with the prior examination. 2. 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 Recommend followup by ultrasound in 5 years. This recommendation follows ACR consensus guidelines: White Paper of the ACR Incidental Findings Committee II on Vascular Findings. J Am Coll Radiol 2013; 10:789-794. Electronically Signed   By: Kathreen Devoid   On: 11/25/2017 15:19   Portable Chest 1 View  Result Date: 11/26/2017 CLINICAL DATA:  Shortness of Breath EXAM: PORTABLE CHEST 1 VIEW COMPARISON:  10/30/2017 FINDINGS: Prior CABG. Mild cardiomegaly. Lungs clear. No effusions or pneumothorax. No acute bony abnormality. IMPRESSION: No active disease. Electronically Signed   By: Rolm Baptise M.D.   On: 11/26/2017 10:08    Assessment and Plan:    Diverticular abscess -Percutaneous drain planned per general surgery team, antibiotics.  Awaiting INR to decrease.  Coronary artery disease status post CABG  approximately 4 weeks ago -Dr. Cyndia Bent.  Stable, no anginal symptoms.  Paroxysmal atrial fibrillation/flutter -Agree with continued use of low-dose amiodarone 100 mg once a day.  He was experiencing some side effects from the amiodarone at higher dosing.  He has been maintaining sinus rhythm predominantly.  It would not be unusual to see occasional bouts of atrial fibrillation or flutter especially giving his underlying infection as well as recent postoperative bypass course.  Resting bradycardia was asymptomatic at night and should be of no cause for alarm.  This was noted during previous hospitalization postoperatively.  Avoiding beta blockers.  Chronic anticoagulation -Coumadin previously on hold because of percutaneous drain.  Score is 4.  Resume anticoagulation when able.  No further recommendations at this time.  Okay to continue low-dose aspirin 81 mg and low-dose amiodarone 100 mg.  Remember, the amiodarone hopefully is temporary.  Obviously if he has significant prolonged bradycardia we may have to discontinue amiodarone altogether.  Recommend notifying Dr. Cyndia Bent letting him know that he is here with this diverticular abscess.   For questions or updates, please contact [place] Please consult [email or web address] for contact info under Cardiology/STEMI.   Signed, Candee Furbish, MD  11/27/2017 11:50 AM

## 2017-11-27 NOTE — Progress Notes (Signed)
PROGRESS NOTE    Dean [name]  GUR:[id number] DOB: January 01, 1946 DOA: 11/26/2017 PCP: [name], PA-C    Brief Narrative:  Patient is 71 year old male with history of recurrent diverticulitis presenting with abdominal pain, diverticular abscess, persistent diverticulitis.  Patient with recent STEMI and CABG.  Coumadin on hold for atrial fibrillation that has been managed as outpatient for sigmoid diverticulitis.  Continue empiric IV antibiotics.  General surgery following.   Assessment & Plan:   Principal Problem:   Diverticulitis Active Problems:   Colonic diverticular abscess   Iron deficiency anemia   S/P CABG x 4   Atrial fibrillation (HCC) [I48.91]  #1 acute diverticulitis with diverticular abscess Patient with multiple episodes of acute diverticulitis.  October 07, 2017 patient had CT scan that showed focally perforated sigmoid diverticulitis at which time patient was admitted followed by general surgery and discharged on oral ciprofloxacin and Flagyl.  Patient also noted to have another bout of acute diverticulitis 10/22/2017 at which time he was treated in the outpatient setting.  Patient presented back with abnormal CT of abdomen and pelvis concerning for acute diverticulitis with diverticular abscess.  In consultation by general surgery were recommending IV antibiotics and percutaneous drainage of diverticular abscess with cultures to be done.  Continue empiric IV Zosyn.  Follow.  2.  Coronary artery disease/status post CABG x4 11/02/2017/ischemic cardiomyopathy/atrial fibrillation Currently stable.  Continue home regimen of amiodarone 100 mg daily.  Once INR is subtherapeutic will place on baby aspirin 81 mg daily per cardiology recommendations.  Due to patient's recent CABG we will consult with cardiology for further evaluation and management.  Follow.  3.  Chronic anticoagulation Coumadin was on hold currently on hold in anticipation of percutaneous drain.  Once drain  has been placed general surgery to advise when anticoagulation may be resumed.  4.  Anemia Hemoglobin currently stable.  Follow H&H.   DVT prophylaxis: SCDs Code Status: Full Family Communication: Updated patient.  No family at bedside. Disposition Plan: Home once clinically stable and medically improved and per general surgery.   Consultants:   Cardiology: Dr. Marlou Porch 11/27/2017  General surgery: Dr. Lucia Gaskins 11/26/2017    Procedures:   Chest x-ray 11/26/2017    Antimicrobials:   IV Zosyn 11/26/2017   Subjective: Sitting up in chair.  Patient states some abdominal discomfort however no significant change since admission.  Denies any chest pain.  No shortness of breath.  Objective: Vitals:   11/26/17 1712 11/26/17 2056 11/27/17 0354 11/27/17 1046  BP: 125/70 121/65 135/67 136/73  Pulse: 63 (!) 58 (!) 58 62  Resp: 18 19 20 16   Temp: 98.2 F (36.8 C) 98.6 F (37 C) 98.3 F (36.8 C) (!) 97.5 F (36.4 C)  TempSrc: Oral Oral Oral Oral  SpO2: 100% 96% 98% 100%  Weight:  68.2 kg (150 lb 5.7 oz)    Height:        Intake/Output Summary (Last 24 hours) at 11/27/2017 1259 Last data filed at 11/27/2017 1054 Gross per 24 hour  Intake 1900 ml  Output 625 ml  Net 1275 ml   Filed Weights   11/26/17 0644 11/26/17 2056  Weight: 68 kg (150 lb) 68.2 kg (150 lb 5.7 oz)    Examination:  General exam: Appears calm and comfortable  Respiratory system: Clear to auscultation. Respiratory effort normal. Cardiovascular system: S1 & S2 heard, RRR. No JVD, murmurs, rubs, gallops or clicks. No pedal edema. Gastrointestinal system: Abdomen is nondistended, soft and nontender. No organomegaly or masses felt. Normal  bowel sounds heard. Central nervous system: Alert and oriented. No focal neurological deficits. Extremities: Symmetric 5 x 5 power. Skin: No rashes, lesions or ulcers Psychiatry: Judgement and insight appear normal. Mood & affect appropriate.     Data Reviewed: I  have personally reviewed following labs and imaging studies  CBC: Recent Labs  Lab 11/22/17 1124 11/26/17 0812 11/27/17 0654  WBC 8.7 7.1 5.9  NEUTROABS 6.6  --   --   HGB 11.3* 10.2* 10.7*  HCT 34.5* 32.2* 33.7*  MCV 87.6 87.7 88.2  PLT 367.0 285 542   Basic Metabolic Panel: Recent Labs  Lab 11/22/17 1124 11/26/17 0812 11/27/17 0654 11/27/17 0845  NA 134* 133* 135  --   K 4.0 3.7 3.6  --   CL 98 101 102  --   CO2 27 23 25   --   GLUCOSE 100* 106* 105*  --   BUN 22 14 11   --   CREATININE 1.39 1.36* 1.13  --   CALCIUM 9.2 8.6* 9.0  --   MG  --   --   --  1.9   GFR: Estimated Creatinine Clearance: 59.5 mL/min (by C-G formula based on SCr of 1.13 mg/dL). Liver Function Tests: Recent Labs  Lab 11/26/17 0812  AST 16  ALT 11*  ALKPHOS 75  BILITOT 0.7  PROT 7.0  ALBUMIN 2.8*   Recent Labs  Lab 11/26/17 0812  LIPASE 30   No results for input(s): AMMONIA in the last 168 hours. Coagulation Profile: Recent Labs  Lab 11/26/17 0812  INR 2.21   Cardiac Enzymes: Recent Labs  Lab 11/26/17 0812  TROPONINI 0.03*   BNP (last 3 results) No results for input(s): PROBNP in the last 8760 hours. HbA1C: No results for input(s): HGBA1C in the last 72 hours. CBG: No results for input(s): GLUCAP in the last 168 hours. Lipid Profile: No results for input(s): CHOL, HDL, LDLCALC, TRIG, CHOLHDL, LDLDIRECT in the last 72 hours. Thyroid Function Tests: No results for input(s): TSH, T4TOTAL, FREET4, T3FREE, THYROIDAB in the last 72 hours. Anemia Panel: No results for input(s): VITAMINB12, FOLATE, FERRITIN, TIBC, IRON, RETICCTPCT in the last 72 hours. Sepsis Labs: No results for input(s): PROCALCITON, LATICACIDVEN in the last 168 hours.  No results found for this or any previous visit (from the past 240 hour(s)).       Radiology Studies: Portable Chest 1 View  Result Date: 11/26/2017 CLINICAL DATA:  Shortness of Breath EXAM: PORTABLE CHEST 1 VIEW COMPARISON:   10/30/2017 FINDINGS: Prior CABG. Mild cardiomegaly. Lungs clear. No effusions or pneumothorax. No acute bony abnormality. IMPRESSION: No active disease. Electronically Signed   By: Rolm Baptise M.D.   On: 11/26/2017 10:08        Scheduled Meds: . amiodarone  100 mg Oral Daily  . febuxostat  40 mg Oral Daily   Continuous Infusions: . sodium chloride 50 mL/hr at 11/26/17 1209  . piperacillin-tazobactam (ZOSYN)  IV 3.375 g (11/27/17 0855)     LOS: 1 day    Time spent: 35 minutes    Irine Seal, MD [facility] Pager [phone]  If 7PM-7AM, please contact night-coverage [email or web address] Password [facility] 11/27/2017, 12:59 PM

## 2017-11-28 ENCOUNTER — Ambulatory Visit: Payer: Medicare Other

## 2017-11-28 LAB — CBC
HCT: 30.7 % — ABNORMAL LOW (ref 39.0–52.0)
HEMOGLOBIN: 9.8 g/dL — AB (ref 13.0–17.0)
MCH: 28 pg (ref 26.0–34.0)
MCHC: 31.9 g/dL (ref 30.0–36.0)
MCV: 87.7 fL (ref 78.0–100.0)
Platelets: 291 10*3/uL (ref 150–400)
RBC: 3.5 MIL/uL — AB (ref 4.22–5.81)
RDW: 16.8 % — ABNORMAL HIGH (ref 11.5–15.5)
WBC: 4.8 10*3/uL (ref 4.0–10.5)

## 2017-11-28 LAB — BASIC METABOLIC PANEL
ANION GAP: 9 (ref 5–15)
BUN: 9 mg/dL (ref 6–20)
CHLORIDE: 104 mmol/L (ref 101–111)
CO2: 22 mmol/L (ref 22–32)
Calcium: 8.9 mg/dL (ref 8.9–10.3)
Creatinine, Ser: 1.18 mg/dL (ref 0.61–1.24)
Glucose, Bld: 88 mg/dL (ref 65–99)
POTASSIUM: 4 mmol/L (ref 3.5–5.1)
SODIUM: 135 mmol/L (ref 135–145)

## 2017-11-28 LAB — PROTIME-INR
INR: 2.93
PROTHROMBIN TIME: 30.3 s — AB (ref 11.4–15.2)

## 2017-11-28 MED ORDER — SODIUM CHLORIDE 0.9 % IV SOLN: Freq: Once | INTRAVENOUS | Status: DC

## 2017-11-28 NOTE — Progress Notes (Signed)
PROGRESS NOTE    Dean [name]  UMP:[id number] DOB: 1946-03-05 DOA: 11/26/2017 PCP: [name], PA-C    Brief Narrative:  Patient is 71 year old male with history of recurrent diverticulitis presenting with abdominal pain, diverticular abscess, persistent diverticulitis.  Patient with recent STEMI and CABG.  Coumadin on hold for atrial fibrillation that has been managed as outpatient for sigmoid diverticulitis.  Continue empiric IV antibiotics.  General surgery following and currently recommending percutaneous drain placement.   Assessment & Plan:   Principal Problem:   Diverticulitis Active Problems:   Colonic diverticular abscess   Iron deficiency anemia   S/P CABG x 4   Atrial fibrillation (HCC) [I48.91]   Abscess of abdominal cavity (HCC)   Acute diverticulitis   SOB (shortness of breath)  #1 acute diverticulitis with diverticular abscess Patient with multiple episodes of acute diverticulitis.  October 07, 2017 patient had CT scan that showed focally perforated sigmoid diverticulitis at which time patient was admitted followed by general surgery and discharged on oral ciprofloxacin and Flagyl.  Patient also noted to have another bout of acute diverticulitis 10/22/2017 at which time he was treated in the outpatient setting.  Patient presented back with abnormal CT of abdomen and pelvis concerning for acute diverticulitis with diverticular abscess.  In consultation by general surgery were recommending IV antibiotics and percutaneous drainage of diverticular abscess with cultures to be done.  Now therapeutic and currently at 2.93 from 2.37.  We will give 2 units of FFP in hopes of reversal of Coumadin.  Continue empiric IV Zosyn.  Awaiting percutaneous drain placement.  Per general surgery.  Follow.  2.  Coronary artery disease/status post CABG x4 11/02/2017/ischemic cardiomyopathy/atrial fibrillation Currently stable.  Continue home regimen of amiodarone 100 mg daily.  Once INR  is subtherapeutic will place on baby aspirin 81 mg daily per cardiology recommendations.  Due to patient's recent CABG cardiology was consulted and followed the patient.    3.  Chronic anticoagulation Coumadin was on hold currently on hold in anticipation of percutaneous drain.  Once drain has been placed general surgery to advise when anticoagulation may be resumed.  4.  Anemia Hemoglobin currently stable.  Follow H&H.   DVT prophylaxis: SCDs Code Status: Full Family Communication: Updated patient.  No family at bedside. Disposition Plan: Home once clinically stable and medically improved and per general surgery.   Consultants:   Cardiology: Dr. Marlou Porch 11/27/2017  General surgery: Dr. Lucia Gaskins 11/26/2017    Procedures:   Chest x-ray 11/26/2017  2 units FFP 11/28/2017  Antimicrobials:   IV Zosyn 11/26/2017   Subjective: Sitting on couch in room.  Denies any further abdominal discomfort.  States he feels better than on admission.  No shortness of breath.  No chest pain.  No bleeding.  Patient asking when his diet may be advanced.   Objective: Vitals:   11/28/17 1400 11/28/17 1448 11/28/17 1605 11/28/17 1647  BP: 136/76 129/61 127/61 (!) 148/69  Pulse: 60 60 61 62  Resp: 16 18 16 17   Temp: 97.6 F (36.4 C) 97.6 F (36.4 C) 97.6 F (36.4 C) 98 F (36.7 C)  TempSrc: Oral Oral  Oral  SpO2: 100% 100%  100%  Weight:      Height:        Intake/Output Summary (Last 24 hours) at 11/28/2017 1816 Last data filed at 11/28/2017 1731 Gross per 24 hour  Intake 2950 ml  Output 1000 ml  Net 1950 ml   Filed Weights   11/26/17 0644 11/26/17 2056  11/27/17 2031  Weight: 68 kg (150 lb) 68.2 kg (150 lb 5.7 oz) 68.3 kg (150 lb 9.2 oz)    Examination:  General exam: Appears calm and comfortable  Respiratory system: Clear to auscultation.  No wheezes, no crackles, no rhonchi.  Respiratory effort normal. Cardiovascular system: S1 & S2 heard, RRR. No JVD, murmurs, rubs, gallops  or clicks. No pedal edema. Gastrointestinal system: Abdomen is soft, nontender, nondistended, positive bowel sounds.  No hepatosplenomegaly.   Central nervous system: Alert and oriented. No focal neurological deficits. Extremities: Symmetric 5 x 5 power. Skin: No rashes, lesions or ulcers Psychiatry: Judgement and insight appear normal. Mood & affect appropriate.     Data Reviewed: I have personally reviewed following labs and imaging studies  CBC: Recent Labs  Lab 11/22/17 1124 11/26/17 0812 11/27/17 0654 11/28/17 0600  WBC 8.7 7.1 5.9 4.8  NEUTROABS 6.6  --   --   --   HGB 11.3* 10.2* 10.7* 9.8*  HCT 34.5* 32.2* 33.7* 30.7*  MCV 87.6 87.7 88.2 87.7  PLT 367.0 285 325 846   Basic Metabolic Panel: Recent Labs  Lab 11/22/17 1124 11/26/17 0812 11/27/17 0654 11/27/17 0845 11/28/17 0600  NA 134* 133* 135  --  135  K 4.0 3.7 3.6  --  4.0  CL 98 101 102  --  104  CO2 27 23 25   --  22  GLUCOSE 100* 106* 105*  --  88  BUN 22 14 11   --  9  CREATININE 1.39 1.36* 1.13  --  1.18  CALCIUM 9.2 8.6* 9.0  --  8.9  MG  --   --   --  1.9  --    GFR: Estimated Creatinine Clearance: 57.1 mL/min (by C-G formula based on SCr of 1.18 mg/dL). Liver Function Tests: Recent Labs  Lab 11/26/17 0812  AST 16  ALT 11*  ALKPHOS 75  BILITOT 0.7  PROT 7.0  ALBUMIN 2.8*   Recent Labs  Lab 11/26/17 0812  LIPASE 30   No results for input(s): AMMONIA in the last 168 hours. Coagulation Profile: Recent Labs  Lab 11/26/17 0812 11/27/17 1347 11/28/17 0600  INR 2.21 2.37 2.93   Cardiac Enzymes: Recent Labs  Lab 11/26/17 0812  TROPONINI 0.03*   BNP (last 3 results) No results for input(s): PROBNP in the last 8760 hours. HbA1C: No results for input(s): HGBA1C in the last 72 hours. CBG: No results for input(s): GLUCAP in the last 168 hours. Lipid Profile: No results for input(s): CHOL, HDL, LDLCALC, TRIG, CHOLHDL, LDLDIRECT in the last 72 hours. Thyroid Function Tests: No  results for input(s): TSH, T4TOTAL, FREET4, T3FREE, THYROIDAB in the last 72 hours. Anemia Panel: No results for input(s): VITAMINB12, FOLATE, FERRITIN, TIBC, IRON, RETICCTPCT in the last 72 hours. Sepsis Labs: No results for input(s): PROCALCITON, LATICACIDVEN in the last 168 hours.  No results found for this or any previous visit (from the past 240 hour(s)).       Radiology Studies: No results found.      Scheduled Meds: . amiodarone  100 mg Oral Daily  . febuxostat  40 mg Oral Daily   Continuous Infusions: . sodium chloride 50 mL/hr at 11/27/17 2243  . sodium chloride    . piperacillin-tazobactam (ZOSYN)  IV 3.375 g (11/28/17 1625)     LOS: 2 days    Time spent: 20 minutes    Irine Seal, MD [facility] Pager [phone]  If 7PM-7AM, please contact night-coverage [email or web address] Password [email or web address]  11/28/2017, 6:16 PM

## 2017-11-28 NOTE — Progress Notes (Signed)
Patient ID: Dean [name], male   DOB: [birth year], 71 y.o.   MRN: [id number] [facility] Surgery Progress Note:   * No surgery found *  Subjective: Mental status is clear and alert.  Awaiting percutaneous drainage of diverticular abscess Objective: Vital signs in last 24 hours: Temp:  [97.5 F (36.4 C)-98.6 F (37 C)] 97.6 F (36.4 C) (12/24 0750) Pulse Rate:  [62-77] 72 (12/24 0750) Resp:  [16-19] 18 (12/24 0750) BP: (119-136)/(64-73) 119/72 (12/24 0750) SpO2:  [100 %] 100 % (12/24 0750) Weight:  [68.3 kg (150 lb 9.2 oz)] 68.3 kg (150 lb 9.2 oz) (12/23 2031)  Intake/Output from previous day: 12/23 0701 - 12/24 0700 In: 4481 [P.O.:1080; I.V.:600; IV Piggyback:50] Out: 1150 [Urine:1150] Intake/Output this shift: No intake/output data recorded.  Physical Exam: Work of breathing is normal.  Pain improved  Lab Results:  Results for orders placed or performed during the hospital encounter of 11/26/17 (from the past 48 hour(s))  Basic metabolic panel     Status: Abnormal   Collection Time: 11/27/17  6:54 AM  Result Value Ref Range   Sodium 135 135 - 145 mmol/L   Potassium 3.6 3.5 - 5.1 mmol/L   Chloride 102 101 - 111 mmol/L   CO2 25 22 - 32 mmol/L   Glucose, Bld 105 (H) 65 - 99 mg/dL   BUN 11 6 - 20 mg/dL   Creatinine, Ser 1.13 0.61 - 1.24 mg/dL   Calcium 9.0 8.9 - 10.3 mg/dL   GFR calc non Af Amer >60 >60 mL/min   GFR calc Af Amer >60 >60 mL/min    Comment: (NOTE) The eGFR has been calculated using the CKD EPI equation. This calculation has not been validated in all clinical situations. eGFR's persistently <60 mL/min signify possible Chronic Kidney Disease.    Anion gap 8 5 - 15  CBC     Status: Abnormal   Collection Time: 11/27/17  6:54 AM  Result Value Ref Range   WBC 5.9 4.0 - 10.5 K/uL   RBC 3.82 (L) 4.22 - 5.81 MIL/uL   Hemoglobin 10.7 (L) 13.0 - 17.0 g/dL   HCT 33.7 (L) 39.0 - 52.0 %   MCV 88.2 78.0 - 100.0 fL   MCH 28.0 26.0 - 34.0 pg   MCHC 31.8 30.0 -  36.0 g/dL   RDW 16.9 (H) 11.5 - 15.5 %   Platelets 325 150 - 400 K/uL  Magnesium     Status: None   Collection Time: 11/27/17  8:45 AM  Result Value Ref Range   Magnesium 1.9 1.7 - 2.4 mg/dL  Protime-INR     Status: Abnormal   Collection Time: 11/27/17  1:47 PM  Result Value Ref Range   Prothrombin Time 25.7 (H) 11.4 - 15.2 seconds   INR 2.37   Protime-INR     Status: Abnormal   Collection Time: 11/28/17  6:00 AM  Result Value Ref Range   Prothrombin Time 30.3 (H) 11.4 - 15.2 seconds   INR 2.93   CBC     Status: Abnormal   Collection Time: 11/28/17  6:00 AM  Result Value Ref Range   WBC 4.8 4.0 - 10.5 K/uL   RBC 3.50 (L) 4.22 - 5.81 MIL/uL   Hemoglobin 9.8 (L) 13.0 - 17.0 g/dL   HCT 30.7 (L) 39.0 - 52.0 %   MCV 87.7 78.0 - 100.0 fL   MCH 28.0 26.0 - 34.0 pg   MCHC 31.9 30.0 - 36.0 g/dL   RDW 16.8 (H)  11.5 - 15.5 %   Platelets 291 150 - 400 K/uL  Basic metabolic panel     Status: None   Collection Time: 11/28/17  6:00 AM  Result Value Ref Range   Sodium 135 135 - 145 mmol/L   Potassium 4.0 3.5 - 5.1 mmol/L   Chloride 104 101 - 111 mmol/L   CO2 22 22 - 32 mmol/L   Glucose, Bld 88 65 - 99 mg/dL   BUN 9 6 - 20 mg/dL   Creatinine, Ser 1.18 0.61 - 1.24 mg/dL   Calcium 8.9 8.9 - 10.3 mg/dL   GFR calc non Af Amer >60 >60 mL/min   GFR calc Af Amer >60 >60 mL/min    Comment: (NOTE) The eGFR has been calculated using the CKD EPI equation. This calculation has not been validated in all clinical situations. eGFR's persistently <60 mL/min signify possible Chronic Kidney Disease.    Anion gap 9 5 - 15    Radiology/Results: Portable Chest 1 View  Result Date: 11/26/2017 CLINICAL DATA:  Shortness of Breath EXAM: PORTABLE CHEST 1 VIEW COMPARISON:  10/30/2017 FINDINGS: Prior CABG. Mild cardiomegaly. Lungs clear. No effusions or pneumothorax. No acute bony abnormality. IMPRESSION: No active disease. Electronically Signed   By: Rolm Baptise M.D.   On: 11/26/2017 10:08     Anti-infectives: Anti-infectives (From admission, onward)   Start     Dose/Rate Route Frequency Ordered Stop   11/26/17 1630  piperacillin-tazobactam (ZOSYN) IVPB 3.375 g     3.375 g 12.5 mL/hr over 240 Minutes Intravenous Every 8 hours 11/26/17 1032     11/26/17 0800  piperacillin-tazobactam (ZOSYN) IVPB 3.375 g     3.375 g 100 mL/hr over 30 Minutes Intravenous  Once 11/26/17 0755 11/26/17 2956      Assessment/Plan: Problem List: Patient Active Problem List   Diagnosis Date Noted  . Colonic diverticular abscess 11/27/2017  . Abscess of abdominal cavity ([place])   . Acute diverticulitis   . SOB (shortness of breath)   . Diverticulitis 11/26/2017  . Atrial fibrillation ([place]) [I48.91] 11/08/2017  . Encounter for therapeutic drug monitoring 11/08/2017  . 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 NSTEMI (non-ST elevated myocardial infarction) ([place]) 10/24/2017  . Diverticulitis of colon with perforation 10/07/2017  . Diarrhea 10/03/2017  . Iron deficiency anemia 10/03/2017  . Loss of weight 10/03/2017  . Left sided abdominal pain of unknown cause 10/03/2017  . Abnormal EKG 07/25/2017  . Chest pain 07/25/2017    INR 2.93.   * No surgery found *    LOS: 2 days   Matt B. Hassell Done, MD, [facility] [phone] beeper [phone]  11/28/2017 8:35 AM

## 2017-11-29 LAB — CBC
HEMATOCRIT: 38 % — AB (ref 39.0–52.0)
Hemoglobin: 12.1 g/dL — ABNORMAL LOW (ref 13.0–17.0)
MCH: 28.2 pg (ref 26.0–34.0)
MCHC: 31.8 g/dL (ref 30.0–36.0)
MCV: 88.6 fL (ref 78.0–100.0)
PLATELETS: 407 10*3/uL — AB (ref 150–400)
RBC: 4.29 MIL/uL (ref 4.22–5.81)
RDW: 16.7 % — AB (ref 11.5–15.5)
WBC: 7.1 10*3/uL (ref 4.0–10.5)

## 2017-11-29 LAB — PREPARE FRESH FROZEN PLASMA
UNIT DIVISION: 0
Unit division: 0

## 2017-11-29 LAB — BASIC METABOLIC PANEL
ANION GAP: 13 (ref 5–15)
BUN: 7 mg/dL (ref 6–20)
CALCIUM: 9.4 mg/dL (ref 8.9–10.3)
CO2: 23 mmol/L (ref 22–32)
Chloride: 99 mmol/L — ABNORMAL LOW (ref 101–111)
Creatinine, Ser: 1.32 mg/dL — ABNORMAL HIGH (ref 0.61–1.24)
GFR, EST NON AFRICAN AMERICAN: 53 mL/min — AB (ref >60)
GLUCOSE: 109 mg/dL — AB (ref 65–99)
POTASSIUM: 3.7 mmol/L (ref 3.5–5.1)
Sodium: 135 mmol/L (ref 135–145)

## 2017-11-29 LAB — BPAM FFP
BLOOD PRODUCT EXPIRATION DATE: 201812252359
BLOOD PRODUCT EXPIRATION DATE: 201812252359
ISSUE DATE / TIME: 201812241153
ISSUE DATE / TIME: 201812241417
UNIT TYPE AND RH: 6200
UNIT TYPE AND RH: 6200

## 2017-11-29 LAB — PROTIME-INR
INR: 2.18
Prothrombin Time: 24.1 seconds — ABNORMAL HIGH (ref 11.4–15.2)

## 2017-11-29 NOTE — Progress Notes (Signed)
PROGRESS NOTE    Dean [name]  VQM:[id number] DOB: 1946/03/29 DOA: 11/26/2017 PCP: [name], PA-C    Brief Narrative:  Patient is 71 year old male with history of recurrent diverticulitis presenting with abdominal pain, diverticular abscess, persistent diverticulitis.  Patient with recent STEMI and CABG.  Coumadin on hold for atrial fibrillation that has been managed as outpatient for sigmoid diverticulitis.  Continue empiric IV antibiotics.  General surgery following and currently recommending percutaneous drain placement.   Assessment & Plan:   Principal Problem:   Diverticulitis Active Problems:   Colonic diverticular abscess   Iron deficiency anemia   S/P CABG x 4   Atrial fibrillation (HCC) [I48.91]   Abscess of abdominal cavity (HCC)   Acute diverticulitis   SOB (shortness of breath)  #1 acute diverticulitis with diverticular abscess Patient with multiple episodes of acute diverticulitis.  October 07, 2017 patient had CT scan that showed focally perforated sigmoid diverticulitis at which time patient was admitted followed by general surgery and discharged on oral ciprofloxacin and Flagyl.  Patient also noted to have another bout of acute diverticulitis 10/22/2017 at which time he was treated in the outpatient setting.  Patient presented back with abnormal CT of abdomen and pelvis concerning for acute diverticulitis with diverticular abscess.Seen in consultation by general surgery who is recommending IV antibiotics and percutaneous drainage of diverticular abscess with cultures to be done. INR therapeutic and currently at 2.18 from 2.93 from 2.37.  Patient status post 2 units of FFP 11/28/2017, in hopes of reversal of Coumadin.  Continue empiric IV Zosyn.  We will repeat another 2 units of FFP if INR is not less than 2 tomorrow.  Awaiting percutaneous drain placement.  Per general surgery.  Follow.  2.  Coronary artery disease/status post CABG x4 11/02/2017/ischemic  cardiomyopathy/atrial fibrillation Currently stable.  Continue home regimen of amiodarone 100 mg daily.  Once INR is subtherapeutic will place on baby aspirin 81 mg daily per cardiology recommendations.  Due to patient's recent CABG cardiology was consulted and followed the patient. CVTS has been notified of patient's admission.  3.  Chronic anticoagulation Coumadin was on hold currently on hold in anticipation of percutaneous drain.  INR currently at 2.18 after 2 units of FFP.  Once drain has been placed general surgery to advise when anticoagulation may be resumed.  4.  Anemia Hemoglobin currently stable.  Follow H&H.   DVT prophylaxis: SCDs Code Status: Full Family Communication: Updated patient.  No family at bedside. Disposition Plan: Home once clinically stable and medically improved, after drain placement and per general surgery.   Consultants:   Cardiology: Dr. Marlou Porch 11/27/2017  General surgery: Dr. Lucia Gaskins 11/26/2017    Procedures:   Chest x-ray 11/26/2017  2 units FFP 11/28/2017  Antimicrobials:   IV Zosyn 11/26/2017   Subjective: Sitting on couch in room.  Patient states feeling better.  Patient asking for diet to be advanced.  Denies any chest pain.  No shortness of breath.  No bleeding.  Wondering how long he will be here for.  Awaiting drain placement.   Objective: Vitals:   11/28/17 1647 11/28/17 2214 11/29/17 0337 11/29/17 0939  BP: (!) 148/69 134/70  122/72  Pulse: 62 62  68  Resp: 17 16  16   Temp: 98 F (36.7 C) 98 F (36.7 C)  97.7 F (36.5 C)  TempSrc: Oral Oral  Oral  SpO2: 100% 100%  100%  Weight:  67.9 kg (149 lb 12.8 oz) 67.9 kg (149 lb 11.1 oz)  Height:        Intake/Output Summary (Last 24 hours) at 11/29/2017 1224 Last data filed at 11/29/2017 1000 Gross per 24 hour  Intake 2610.83 ml  Output 500 ml  Net 2110.83 ml   Filed Weights   11/27/17 2031 11/28/17 2214 11/29/17 0337  Weight: 68.3 kg (150 lb 9.2 oz) 67.9 kg (149 lb 12.8  oz) 67.9 kg (149 lb 11.1 oz)    Examination:  General exam: NAD Respiratory system: Clear to auscultation bilaterally.  No wheezes, no crackles, no rhonchi. Cardiovascular system: Regular rate and rhythm no murmurs rubs or gallops.  No JVD.  No lower extremity edema.  Sternal scar clean/dry/intact.  Gastrointestinal system: Abdomen is nontender, nondistended, positive bowel sounds.  No hepatosplenomegaly.  Central nervous system: Alert and oriented. No focal neurological deficits. Extremities: Symmetric 5 x 5 power. Skin: No rashes, lesions or ulcers Psychiatry: Judgement and insight appear normal. Mood & affect appropriate.     Data Reviewed: I have personally reviewed following labs and imaging studies  CBC: Recent Labs  Lab 11/26/17 0812 11/27/17 0654 11/28/17 0600 11/29/17 0744  WBC 7.1 5.9 4.8 7.1  HGB 10.2* 10.7* 9.8* 12.1*  HCT 32.2* 33.7* 30.7* 38.0*  MCV 87.7 88.2 87.7 88.6  PLT 285 325 291 417*   Basic Metabolic Panel: Recent Labs  Lab 11/26/17 0812 11/27/17 0654 11/27/17 0845 11/28/17 0600 11/29/17 0744  NA 133* 135  --  135 135  K 3.7 3.6  --  4.0 3.7  CL 101 102  --  104 99*  CO2 23 25  --  22 23  GLUCOSE 106* 105*  --  88 109*  BUN 14 11  --  9 7  CREATININE 1.36* 1.13  --  1.18 1.32*  CALCIUM 8.6* 9.0  --  8.9 9.4  MG  --   --  1.9  --   --    GFR: Estimated Creatinine Clearance: 50.7 mL/min (A) (by C-G formula based on SCr of 1.32 mg/dL (H)). Liver Function Tests: Recent Labs  Lab 11/26/17 0812  AST 16  ALT 11*  ALKPHOS 75  BILITOT 0.7  PROT 7.0  ALBUMIN 2.8*   Recent Labs  Lab 11/26/17 0812  LIPASE 30   No results for input(s): AMMONIA in the last 168 hours. Coagulation Profile: Recent Labs  Lab 11/26/17 0812 11/27/17 1347 11/28/17 0600 11/29/17 0744  INR 2.21 2.37 2.93 2.18   Cardiac Enzymes: Recent Labs  Lab 11/26/17 0812  TROPONINI 0.03*   BNP (last 3 results) No results for input(s): PROBNP in the last 8760  hours. HbA1C: No results for input(s): HGBA1C in the last 72 hours. CBG: No results for input(s): GLUCAP in the last 168 hours. Lipid Profile: No results for input(s): CHOL, HDL, LDLCALC, TRIG, CHOLHDL, LDLDIRECT in the last 72 hours. Thyroid Function Tests: No results for input(s): TSH, T4TOTAL, FREET4, T3FREE, THYROIDAB in the last 72 hours. Anemia Panel: No results for input(s): VITAMINB12, FOLATE, FERRITIN, TIBC, IRON, RETICCTPCT in the last 72 hours. Sepsis Labs: No results for input(s): PROCALCITON, LATICACIDVEN in the last 168 hours.  No results found for this or any previous visit (from the past 240 hour(s)).       Radiology Studies: No results found.      Scheduled Meds: . amiodarone  100 mg Oral Daily  . febuxostat  40 mg Oral Daily   Continuous Infusions: . sodium chloride 50 mL/hr at 11/27/17 2243  . sodium chloride    . piperacillin-tazobactam (  ZOSYN)  IV 3.375 g (11/29/17 0901)     LOS: 3 days    Time spent: 35 minutes    Irine Seal, MD [facility] Pager [phone]  If 7PM-7AM, please contact night-coverage [email or web address] Password [facility] 11/29/2017, 12:24 PM

## 2017-11-29 NOTE — Progress Notes (Signed)
Patient ID: Dean [name], male   DOB: 01-28-1946, 71 y.o.   MRN: [id number] [facility] Surgery Progress Note:   * No surgery found *  Subjective: Mental status is clear;  He is frustrated and still awaiting perc drain placement.  Received FFP Objective: Vital signs in last 24 hours: Temp:  [97.4 F (36.3 C)-98 F (36.7 C)] 98 F (36.7 C) (12/24 2214) Pulse Rate:  [60-65] 62 (12/24 2214) Resp:  [16-18] 16 (12/24 2214) BP: (107-148)/(57-76) 134/70 (12/24 2214) SpO2:  [99 %-100 %] 100 % (12/24 2214) Weight:  [67.9 kg (149 lb 11.1 oz)-67.9 kg (149 lb 12.8 oz)] 67.9 kg (149 lb 11.1 oz) (12/25 0337)  Intake/Output from previous day: 12/24 0701 - 12/25 0700 In: 2610.8 [P.O.:1040; I.V.:1000.8; Blood:420; IV Piggyback:150] Out: 500 [Urine:500] Intake/Output this shift: No intake/output data recorded.  Physical Exam: Work of breathing is not labored.  Up and about with little pain  Lab Results:  Results for orders placed or performed during the hospital encounter of 11/26/17 (from the past 48 hour(s))  Protime-INR     Status: Abnormal   Collection Time: 11/27/17  1:47 PM  Result Value Ref Range   Prothrombin Time 25.7 (H) 11.4 - 15.2 seconds   INR 2.37   Protime-INR     Status: Abnormal   Collection Time: 11/28/17  6:00 AM  Result Value Ref Range   Prothrombin Time 30.3 (H) 11.4 - 15.2 seconds   INR 2.93   CBC     Status: Abnormal   Collection Time: 11/28/17  6:00 AM  Result Value Ref Range   WBC 4.8 4.0 - 10.5 K/uL   RBC 3.50 (L) 4.22 - 5.81 MIL/uL   Hemoglobin 9.8 (L) 13.0 - 17.0 g/dL   HCT 30.7 (L) 39.0 - 52.0 %   MCV 87.7 78.0 - 100.0 fL   MCH 28.0 26.0 - 34.0 pg   MCHC 31.9 30.0 - 36.0 g/dL   RDW 16.8 (H) 11.5 - 15.5 %   Platelets 291 150 - 400 K/uL  Basic metabolic panel     Status: None   Collection Time: 11/28/17  6:00 AM  Result Value Ref Range   Sodium 135 135 - 145 mmol/L   Potassium 4.0 3.5 - 5.1 mmol/L   Chloride 104 101 - 111 mmol/L   CO2 22 22 - 32  mmol/L   Glucose, Bld 88 65 - 99 mg/dL   BUN 9 6 - 20 mg/dL   Creatinine, Ser 1.18 0.61 - 1.24 mg/dL   Calcium 8.9 8.9 - 10.3 mg/dL   GFR calc non Af Amer >60 >60 mL/min   GFR calc Af Amer >60 >60 mL/min    Comment: (NOTE) The eGFR has been calculated using the CKD EPI equation. This calculation has not been validated in all clinical situations. eGFR's persistently <60 mL/min signify possible Chronic Kidney Disease.    Anion gap 9 5 - 15  Prepare fresh frozen plasma     Status: None (Preliminary result)   Collection Time: 11/28/17  8:38 AM  Result Value Ref Range   Unit Number [id number]    Blood Component Type THAWED PLASMA    Unit division 00    Status of Unit ISSUED    Transfusion Status OK TO TRANSFUSE    Unit Number [id number]    Blood Component Type THWPLS APHR2    Unit division 00    Status of Unit ISSUED    Transfusion Status OK TO TRANSFUSE  Radiology/Results: No results found.  Anti-infectives: Anti-infectives (From admission, onward)   Start     Dose/Rate Route Frequency Ordered Stop   11/26/17 1630  piperacillin-tazobactam (ZOSYN) IVPB 3.375 g     3.375 g 12.5 mL/hr over 240 Minutes Intravenous Every 8 hours 11/26/17 1032     11/26/17 0800  piperacillin-tazobactam (ZOSYN) IVPB 3.375 g     3.375 g 100 mL/hr over 30 Minutes Intravenous  Once 11/26/17 0755 11/26/17 9022      Assessment/Plan: Problem List: Patient Active Problem List   Diagnosis Date Noted  . Colonic diverticular abscess 11/27/2017  . Abscess of abdominal cavity ([place])   . Acute diverticulitis   . SOB (shortness of breath)   . Diverticulitis 11/26/2017  . Atrial fibrillation ([place]) [I48.91] 11/08/2017  . Encounter for therapeutic drug monitoring 11/08/2017  . 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 NSTEMI (non-ST elevated myocardial infarction) ([place]) 10/24/2017  . Diverticulitis of colon with perforation 10/07/2017  . Diarrhea 10/03/2017  . Iron deficiency anemia 10/03/2017  . Loss of  weight 10/03/2017  . Left sided abdominal pain of unknown cause 10/03/2017  . Abnormal EKG 07/25/2017  . Chest pain 07/25/2017    Dr. Dema Severin will follow up tomorrow about longterm management of his diverticulitis.  He is still recovering from his STEMI * No surgery found *    LOS: 3 days   Matt B. Hassell Done, MD, [facility] [phone] beeper [phone]  11/29/2017 8:49 AM

## 2017-11-30 LAB — BASIC METABOLIC PANEL
Anion gap: 11 (ref 5–15)
BUN: 6 mg/dL (ref 6–20)
CHLORIDE: 99 mmol/L — AB (ref 101–111)
CO2: 25 mmol/L (ref 22–32)
CREATININE: 1.18 mg/dL (ref 0.61–1.24)
Calcium: 9 mg/dL (ref 8.9–10.3)
GFR calc Af Amer: >60 mL/min (ref >60)
GFR calc non Af Amer: >60 mL/min (ref >60)
Glucose, Bld: 97 mg/dL (ref 65–99)
Potassium: 3.6 mmol/L (ref 3.5–5.1)
SODIUM: 135 mmol/L (ref 135–145)

## 2017-11-30 LAB — PROTIME-INR
INR: 2.6
Prothrombin Time: 27.6 seconds — ABNORMAL HIGH (ref 11.4–15.2)

## 2017-11-30 LAB — CBC
HCT: 33.6 % — ABNORMAL LOW (ref 39.0–52.0)
Hemoglobin: 10.8 g/dL — ABNORMAL LOW (ref 13.0–17.0)
MCH: 28.2 pg (ref 26.0–34.0)
MCHC: 32.1 g/dL (ref 30.0–36.0)
MCV: 87.7 fL (ref 78.0–100.0)
PLATELETS: 300 10*3/uL (ref 150–400)
RBC: 3.83 MIL/uL — AB (ref 4.22–5.81)
RDW: 16.6 % — AB (ref 11.5–15.5)
WBC: 5.4 10*3/uL (ref 4.0–10.5)

## 2017-11-30 MED ORDER — COLCHICINE 0.6 MG PO TABS
0.3000 mg | ORAL_TABLET | Freq: Every day | ORAL | Status: DC | PRN
  Administered 2017-11-30: 0.3 mg via ORAL
  Filled 2017-11-30: qty 1
  Filled 2017-11-30 (×2): qty 0.5

## 2017-11-30 MED ORDER — SODIUM CHLORIDE 0.9 % IV SOLN: Freq: Once | INTRAVENOUS | Status: DC

## 2017-11-30 NOTE — Care Management Important Message (Signed)
Important Message  Patient Details  Name: Dean [name] MRN: [id number] Date of Birth: 12/10/1945   Medicare Important Message Given:  Yes    Orbie Pyo 11/30/2017, 2:04 PM

## 2017-11-30 NOTE — Progress Notes (Signed)
PROGRESS NOTE    Dean [name]  AST:[id number] DOB: January 29, 1946 DOA: 11/26/2017 PCP: [name], PA-C    Brief Narrative:  Patient is 71 year old male with history of recurrent diverticulitis presenting with abdominal pain, diverticular abscess, persistent diverticulitis.  Patient with recent STEMI and CABG.  Coumadin on hold for atrial fibrillation that has been managed as outpatient for sigmoid diverticulitis.  Continue empiric IV antibiotics.  General surgery following and currently recommending percutaneous drain placement.   Assessment & Plan:   Principal Problem:   Diverticulitis Active Problems:   Colonic diverticular abscess   Iron deficiency anemia   S/P CABG x 4   Atrial fibrillation (HCC) [I48.91]   Abscess of abdominal cavity (HCC)   Acute diverticulitis   SOB (shortness of breath)  #1 acute diverticulitis with diverticular abscess Patient with multiple episodes of acute diverticulitis.  October 07, 2017 patient had CT scan that showed focally perforated sigmoid diverticulitis at which time patient was admitted followed by general surgery and discharged on oral ciprofloxacin and Flagyl.  Patient also noted to have another bout of acute diverticulitis 10/22/2017 at which time he was treated in the outpatient setting.  Patient presented back with abnormal CT of abdomen and pelvis concerning for acute diverticulitis with diverticular abscess.Seen in consultation by general surgery who is recommending IV antibiotics and percutaneous drainage of diverticular abscess with cultures to be done. INR therapeutic and currently at 2.18 from 2.93 from 2.37.  Patient status post 2 units of FFP 11/28/2017, in hopes of reversal of Coumadin.  Continue empiric IV Zosyn. PT/INR pending for today. We will repeat another 2 units of FFP if INR is not less than 2.  Awaiting percutaneous drain placement.  Per general surgery.  Follow.  2.  Coronary artery disease/status post CABG x4  11/02/2017/ischemic cardiomyopathy/atrial fibrillation Currently stable.  Continue home regimen of amiodarone 100 mg daily.  Once INR is subtherapeutic will place on baby aspirin 81 mg daily per cardiology recommendations.  Due to patient's recent CABG cardiology was consulted and followed the patient. CVTS has been notified of patient's admission.  3.  Chronic anticoagulation Coumadin was on hold currently on hold in anticipation of percutaneous drain.  INR was at 2.18 after 2 units of FFP on 11/29/2017. PT/INR pending for today..  Once drain has been placed general surgery to advise when anticoagulation may be resumed. PT/INR is still above 2 will give another 2 units of FFP.  4.  Anemia Hemoglobin currently stable.  Labs pending this morning. Follow H&H.   DVT prophylaxis: SCDs Code Status: Full Family Communication: Updated patient.  No family at bedside. Disposition Plan: Home once clinically stable and medically improved, after drain placement and per general surgery.   Consultants:   Cardiology: Dr. Marlou Porch 11/27/2017  General surgery: Dr. Lucia Gaskins 11/26/2017  Interventional radiology 11/30/2017  Procedures:   Chest x-ray 11/26/2017  2 units FFP 11/28/2017  Antimicrobials:   IV Zosyn 11/26/2017   Subjective: Patient sitting on couch. Denies any chest pain or shortness of breath. No bleeding. No abdominal pain. Patient does complain of some minor knee and elbow pain which he states is reminiscent of possible early gout. Patient states is about a 1 out of 10 and at this time does not want any medication for it.   Objective: Vitals:   11/29/17 2130 11/30/17 0500 11/30/17 0505 11/30/17 0925  BP: (!) 142/58  137/70 136/80  Pulse: 65  60 63  Resp: 16  16 17   Temp: 98.3 F (36.8 C)  97.7 F (36.5 C) 97.7 F (36.5 C)  TempSrc: Oral  Oral Oral  SpO2: 100%  100% 98%  Weight: 67.9 kg (149 lb 11.1 oz) 67.9 kg (149 lb 11.1 oz)    Height:        Intake/Output Summary (Last  24 hours) at 11/30/2017 1216 Last data filed at 11/30/2017 1000 Gross per 24 hour  Intake 3669.17 ml  Output 904 ml  Net 2765.17 ml   Filed Weights   11/29/17 0337 11/29/17 2130 11/30/17 0500  Weight: 67.9 kg (149 lb 11.1 oz) 67.9 kg (149 lb 11.1 oz) 67.9 kg (149 lb 11.1 oz)    Examination:  General exam: NAD Respiratory system: Lungs are to auscultation bilaterally. No crackles, rhonchi, no wheezing.  Cardiovascular system: Regular rate and rhythm no murmurs rubs or gallops.  No JVD.  No lower extremity edema.  Sternal scar clean/dry/intact.  Gastrointestinal system: Abdomen is soft, nontender, nondistended, positive bowel sounds. No hepatosplenomegaly.  Central nervous system: Alert and oriented. No focal neurological deficits. Extremities: Symmetric 5 x 5 power. Skin: No rashes, lesions or ulcers Psychiatry: Judgement and insight appear normal. Mood & affect appropriate.     Data Reviewed: I have personally reviewed following labs and imaging studies  CBC: Recent Labs  Lab 11/26/17 0812 11/27/17 0654 11/28/17 0600 11/29/17 0744 11/30/17 0837  WBC 7.1 5.9 4.8 7.1 5.4  HGB 10.2* 10.7* 9.8* 12.1* 10.8*  HCT 32.2* 33.7* 30.7* 38.0* 33.6*  MCV 87.7 88.2 87.7 88.6 87.7  PLT 285 325 291 407* 270   Basic Metabolic Panel: Recent Labs  Lab 11/26/17 0812 11/27/17 0654 11/27/17 0845 11/28/17 0600 11/29/17 0744 11/30/17 0837  NA 133* 135  --  135 135 135  K 3.7 3.6  --  4.0 3.7 3.6  CL 101 102  --  104 99* 99*  CO2 23 25  --  22 23 25   GLUCOSE 106* 105*  --  88 109* 97  BUN 14 11  --  9 7 6   CREATININE 1.36* 1.13  --  1.18 1.32* 1.18  CALCIUM 8.6* 9.0  --  8.9 9.4 9.0  MG  --   --  1.9  --   --   --    GFR: Estimated Creatinine Clearance: 56.7 mL/min (by C-G formula based on SCr of 1.18 mg/dL). Liver Function Tests: Recent Labs  Lab 11/26/17 0812  AST 16  ALT 11*  ALKPHOS 75  BILITOT 0.7  PROT 7.0  ALBUMIN 2.8*   Recent Labs  Lab 11/26/17 0812  LIPASE  30   No results for input(s): AMMONIA in the last 168 hours. Coagulation Profile: Recent Labs  Lab 11/26/17 0812 11/27/17 1347 11/28/17 0600 11/29/17 0744 11/30/17 0837  INR 2.21 2.37 2.93 2.18 2.60   Cardiac Enzymes: Recent Labs  Lab 11/26/17 0812  TROPONINI 0.03*   BNP (last 3 results) No results for input(s): PROBNP in the last 8760 hours. HbA1C: No results for input(s): HGBA1C in the last 72 hours. CBG: No results for input(s): GLUCAP in the last 168 hours. Lipid Profile: No results for input(s): CHOL, HDL, LDLCALC, TRIG, CHOLHDL, LDLDIRECT in the last 72 hours. Thyroid Function Tests: No results for input(s): TSH, T4TOTAL, FREET4, T3FREE, THYROIDAB in the last 72 hours. Anemia Panel: No results for input(s): VITAMINB12, FOLATE, FERRITIN, TIBC, IRON, RETICCTPCT in the last 72 hours. Sepsis Labs: No results for input(s): PROCALCITON, LATICACIDVEN in the last 168 hours.  No results found for this or any previous visit (from  the past 240 hour(s)).       Radiology Studies: No results found.      Scheduled Meds: . amiodarone  100 mg Oral Daily  . febuxostat  40 mg Oral Daily   Continuous Infusions: . sodium chloride 50 mL/hr at 11/30/17 1135  . sodium chloride    . piperacillin-tazobactam (ZOSYN)  IV 3.375 g (11/30/17 0951)     LOS: 4 days    Time spent: 11 minutes    Irine Seal, MD [facility] Pager [phone]  If 7PM-7AM, please contact night-coverage [email or web address] Password [email or web address] 11/30/2017, 12:16 PM

## 2017-11-30 NOTE — Progress Notes (Addendum)
      [address]       [place] [address]             [phone]            Subjective: Patient denies abdominal pain. He is in good spirits.   Objective: Vital signs in last 24 hours: Temp:  [97.7 F (36.5 C)-98.3 F (36.8 C)] 97.7 F (36.5 C) (12/26 0505) Pulse Rate:  [60-68] 60 (12/26 0505) Cardiac Rhythm: Heart block;Bundle branch block (12/26 0700) Resp:  [16-17] 16 (12/26 0505) BP: (122-142)/(58-72) 137/70 (12/26 0505) SpO2:  [100 %] 100 % (12/26 0505) Weight:  [149 lb 11.1 oz (67.9 kg)] 149 lb 11.1 oz (67.9 kg) (12/26 0500)   Current Weight  11/30/17 149 lb 11.1 oz (67.9 kg)    Intake/Output from previous day: 12/25 0701 - 12/26 0700 In: 3830 [P.O.:1180; I.V.:2400; IV Piggyback:250] Out: 704 [Urine:704]   Physical Exam:  Cardiovascular: RRR, no murmurs, gallops, or rubs. Pulmonary: Clear to auscultation bilaterally; no rales, wheezes, or rhonchi. Abdomen: Soft, non tender, bowel sounds present. Extremities: Mild bilateral lower extremity edema. Wounds: Clean and dry.  No erythema or signs of infection.  Lab Results: CBC: Recent Labs    11/28/17 0600 11/29/17 0744  WBC 4.8 7.1  HGB 9.8* 12.1*  HCT 30.7* 38.0*  PLT 291 407*   BMET:  Recent Labs    11/28/17 0600 11/29/17 0744  NA 135 135  K 4.0 3.7  CL 104 99*  CO2 22 23  GLUCOSE 88 109*  BUN 9 7  CREATININE 1.18 1.32*  CALCIUM 8.9 9.4    PT/INR:  Lab Results  Component Value Date   INR 2.18 11/29/2017   INR 2.93 11/28/2017   INR 2.37 11/27/2017   ABG:  INR: Will add last result for INR, ABG once components are confirmed Will add last 4 CBG results once components are confirmed  Assessment/Plan:  1. CV - S/p NSTEMI and CABG x 4 on 10/26/2017. Had atrial fibrillation during that hospitalization. On Amiodarone 100 mg daily. SR,first degree heart block this am. Coumadin on hold and given FFP previously for IR to place drain. 2.  Pulmonary - On room air. 3.  GI-diverticulitis, peri diverticular abscess. IR to place drain today if INR decreased. 4. ID-on Zosyn for above. 5. Anemia-H and H 12.1 and [place] 11/30/2017,8:46 AM  Agree with above

## 2017-11-30 NOTE — Progress Notes (Signed)
Subjective No acute events. Denies abdominal pain. Denies n/v. Feeling well  Objective: Vital signs in last 24 hours: Temp:  [97.7 F (36.5 C)-98.3 F (36.8 C)] 97.7 F (36.5 C) (12/26 0505) Pulse Rate:  [60-68] 60 (12/26 0505) Resp:  [16-17] 16 (12/26 0505) BP: (122-142)/(58-72) 137/70 (12/26 0505) SpO2:  [100 %] 100 % (12/26 0505) Weight:  [67.9 kg (149 lb 11.1 oz)] 67.9 kg (149 lb 11.1 oz) (12/26 0500) Last BM Date: 11/28/17  Intake/Output from previous day: 12/25 0701 - 12/26 0700 In: 3830 [P.O.:1180; I.V.:2400; IV Piggyback:250] Out: 704 [HTDSK:876] Intake/Output this shift: No intake/output data recorded.  Gen: NAD, comfortable CV: RRR Pulm: Normal work of breathing Abd: Soft, NT/ND Ext: SCDs in place  Lab Results: CBC  Recent Labs    11/28/17 0600 11/29/17 0744  WBC 4.8 7.1  HGB 9.8* 12.1*  HCT 30.7* 38.0*  PLT 291 407*   BMET Recent Labs    11/28/17 0600 11/29/17 0744  NA 135 135  K 4.0 3.7  CL 104 99*  CO2 22 23  GLUCOSE 88 109*  BUN 9 7  CREATININE 1.18 1.32*  CALCIUM 8.9 9.4   PT/INR Recent Labs    11/28/17 0600 11/29/17 0744  LABPROT 30.3* 24.1*  INR 2.93 2.18   ABG No results for input(s): PHART, HCO3 in the last 72 hours.  Invalid input(s): PCO2, PO2  Studies/Results:  Anti-infectives: Anti-infectives (From admission, onward)   Start     Dose/Rate Route Frequency Ordered Stop   11/26/17 1630  piperacillin-tazobactam (ZOSYN) IVPB 3.375 g     3.375 g 12.5 mL/hr over 240 Minutes Intravenous Every 8 hours 11/26/17 1032     11/26/17 0800  piperacillin-tazobactam (ZOSYN) IVPB 3.375 g     3.375 g 100 mL/hr over 30 Minutes Intravenous  Once 11/26/17 0755 11/26/17 8115       Assessment/Plan: Patient Active Problem List   Diagnosis Date Noted  . Colonic diverticular abscess 11/27/2017  . Abscess of abdominal cavity ([place])   . Acute diverticulitis   . SOB (shortness of breath)   . Diverticulitis 11/26/2017  . Atrial  fibrillation ([place]) [I48.91] 11/08/2017  . Encounter for therapeutic drug monitoring 11/08/2017  . 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 NSTEMI (non-ST elevated myocardial infarction) ([place]) 10/24/2017  . Diverticulitis of colon with perforation 10/07/2017  . Diarrhea 10/03/2017  . Iron deficiency anemia 10/03/2017  . Loss of weight 10/03/2017  . Left sided abdominal pain of unknown cause 10/03/2017  . Abnormal EKG 07/25/2017  . Chest pain 07/25/2017   71yo gentleman with hx of HTN, Afib, NSTEMI, CABG 10/2017 on warfarin - admitted with acute diverticulitis  -Repeat INR pending; got 2U FFP yesterday -Planning IR later today if INR satisfactory for percutaneous drainage -We will continue to follow with you; continue IV abx   LOS: 4 days   Sharon Mt. Dema Severin, M.D. General and Colorectal Surgery [facility]

## 2017-11-30 NOTE — Consult Note (Signed)
Chief Complaint: Patient was seen in consultation today for diverticular abscess drain placement Chief Complaint  Patient presents with  . Abdominal Pain   at the request of Dr Deland Pretty  Referring Physician(s): CCS  Supervising Physician: Arne Cleveland  Patient Status: [facility] - In-pt  History of Present Illness: Dean [name] is a 71 y.o. male   Dr Grandville Silos note 12/25: Patient with multiple episodes of acute diverticulitis.  October 07, 2017 patient had CT scan that showed focally perforated sigmoid diverticulitis at which time patient was admitted followed by general surgery and discharged on oral ciprofloxacin and Flagyl.  Patient also noted to have another bout of acute diverticulitis 10/22/2017 at which time he was treated in the outpatient setting.  Patient presented back with abnormal CT of abdomen and pelvis concerning for acute diverticulitis with diverticular abscess.Seen in consultation by general surgery who is recommending IV antibiotics and percutaneous drainage of diverticular abscess with cultures to be done  CT 12/21: IMPRESSION: 1. Persistent diverticulitis of the sigmoid colon. Peridiverticular abscess measuring 3.8 x 3.5 x 3.9 cm abutting the dome of the bladder which has enlarged compared with the prior examination. 2. 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 Recommend followup by ultrasound in 5 years.4.   Request made for diverticular drain placement Dr Vernard Gambles has reviewed imaging and approves of same LD coumadin 12/23 INR 2.6 today (2.18) Will need INR close to 1.5 to safely move ahead with drain placement  Past Medical History:  Diagnosis Date  . Acid reflux   . Arthropathy   . Atrial fibrillation ([place]) 10/2017  . Coronary artery disease   . Diverticulitis   . Dizziness   . Fatigue   . Gout   . Hepatitis A age 23  . Hyperlipemia   . Hypogonadism male   . Knee pain   . Night sweats   . Non compliance with medical  treatment   . Non-STEMI (non-ST elevated myocardial infarction) ([place]) 10/2017  . Palpitations   . Seasonal allergies   . SOB (shortness of breath)   . Vitamin D deficiency     Past Surgical History:  Procedure Laterality Date  . COLONOSCOPY  ~ 2014   Dr Ferdinand Lango in [facility].  Diverticulosis.  pt denies colon polyps.    . CORONARY ARTERY BYPASS GRAFT N/A 10/26/2017   Procedure: CORONARY ARTERY BYPASS GRAFTING (CABG) x four , using left internal mammary artery and bilateral legs greater saphenous vein harvested endoscopically;  Surgeon: Gaye Pollack, MD;  Location: [facility];  Service: Open Heart Surgery;  Laterality: N/A;  . LEFT HEART CATH AND CORONARY ANGIOGRAPHY N/A 10/25/2017   Procedure: LEFT HEART CATH AND CORONARY ANGIOGRAPHY;  Surgeon: Leonie Man, MD;  Location: [facility];  Service: Cardiovascular;  Laterality: N/A;  . TEE WITHOUT CARDIOVERSION N/A 10/26/2017   Procedure: TRANSESOPHAGEAL ECHOCARDIOGRAM (TEE);  Surgeon: Gaye Pollack, MD;  Location: [facility];  Service: Open Heart Surgery;  Laterality: N/A;    Allergies: Aspirin and Crestor [rosuvastatin calcium]  Medications: Prior to Admission medications   Medication Sig Start Date End Date Taking? Authorizing Provider  acetaminophen (TYLENOL) 325 MG tablet Take 325 mg every 6 (six) hours as needed by mouth for mild pain.   Yes [provider]  amiodarone (PACERONE) 200 MG tablet Take 1 tablet (200 mg total) by mouth 2 (two) times daily. For one week;then take Amiodarone 200 mg by mouth daily thereafter. Patient taking differently: Take 100 mg by mouth  daily.  11/06/17  Yes Lars Pinks M, PA-C  amoxicillin-clavulanate (AUGMENTIN) 875-125 MG tablet Take 1 tablet by mouth 2 (two) times daily. 11/25/17  Yes Esterwood, Amy S, PA-C  aspirin EC 81 MG EC tablet Take 1 tablet (81 mg total) by mouth daily. 11/06/17  Yes Lars Pinks M, PA-C  Cholecalciferol (VITAMIN D3) 5000 units CAPS Take 5,000 Units  daily by mouth.    Yes [provider]  colchicine 0.6 MG tablet Take 0.6 mg by mouth daily as needed (gout).    Yes [provider]  febuxostat (ULORIC) 40 MG tablet Take 40 mg by mouth daily.   Yes [provider]  hydrocortisone cream 1 % Apply 1 application topically as needed for itching. Do NOT apply to sternal wound 11/06/17  Yes Lars Pinks M, PA-C  warfarin (COUMADIN) 1 MG tablet Take 1 tablet (1 mg total) by mouth daily. Or as directed. Patient taking differently: Take 1 mg by mouth daily. 2 mg on Monday, Wednesday and Friday. 1 mg all other days 11/06/17  Yes Nani Skillern, PA-C     Family History  Problem Relation Age of Onset  . Hyperlipidemia Mother   . Alzheimer's disease Mother   . Hyperlipidemia Brother   . Alzheimer's disease Maternal Grandmother   . Colon cancer Neg Hx   . Esophageal cancer Neg Hx   . Stomach cancer Neg Hx     Social History   Socioeconomic History  . Marital status: Single    Spouse name: None  . Number of children: None  . Years of education: None  . Highest education level: None  Social Needs  . Financial resource strain: None  . Food insecurity - worry: None  . Food insecurity - inability: None  . Transportation needs - medical: None  . Transportation needs - non-medical: None  Occupational History  . Occupation: retired  Tobacco Use  . Smoking status: Former Research scientist (life sciences)  . Smokeless tobacco: Never Used  Substance and Sexual Activity  . Alcohol use: Yes    Alcohol/week: 0.0 oz    Comment: once monthly  . Drug use: No  . Sexual activity: None  Other Topics Concern  . None  Social History Narrative  . None    Review of Systems: A 12 point ROS discussed and pertinent positives are indicated in the HPI above.  All other systems are negative.  Review of Systems  Constitutional: Positive for activity change and fatigue. Negative for fever.  Respiratory: Negative for shortness of breath.     Gastrointestinal: Positive for abdominal pain and nausea.  Neurological: Positive for weakness.  Psychiatric/Behavioral: Negative for behavioral problems and confusion.    Vital Signs: BP 136/80 (BP Location: Right Arm)   Pulse 63   Temp 97.7 F (36.5 C) (Oral)   Resp 17   Ht 5' 9.5" (1.765 m)   Wt 149 lb 11.1 oz (67.9 kg)   SpO2 98%   BMI 21.79 kg/m   Physical Exam  Constitutional: He is oriented to person, place, and time.  Cardiovascular: Normal rate and regular rhythm.  Pulmonary/Chest: Effort normal and breath sounds normal.  Abdominal: Normal appearance and bowel sounds are normal. There is generalized tenderness.  Neurological: He is alert and oriented to person, place, and time.  Skin: Skin is warm and dry.  Psychiatric: He has a normal mood and affect. His behavior is normal.  Nursing note and vitals reviewed.   Imaging: Ct Abdomen Pelvis W Contrast  Result Date:  11/25/2017 CLINICAL DATA:  Recent diverticulitis.  Abdominal pain persists. EXAM: CT ABDOMEN AND PELVIS WITH CONTRAST TECHNIQUE: Multidetector CT imaging of the abdomen and pelvis was performed using the standard protocol following bolus administration of intravenous contrast. CONTRAST:  165mL ISOVUE-300 IOPAMIDOL (ISOVUE-300) INJECTION 61% COMPARISON:  10/21/2017 FINDINGS: Lower chest: No acute abnormality. Hepatobiliary: Tiny hypodense lesion within the right liver lobe is too small to characterize, stable compared to the previous study and likely reflects a benign cyst. Diffuse low attenuation of the liver as can be seen with hepatic steatosis. No suspicious mass or lesion within the liver. Gallbladder appears normal. No bile duct dilatation. Pancreas: Unremarkable. No pancreatic ductal dilatation or surrounding inflammatory changes. Spleen: Normal in size without focal abnormality. Adrenals/Urinary Tract: Adrenal glands are unremarkable. Kidneys are normal, without renal calculi, focal lesion, or  hydronephrosis. Bladder is unremarkable. Stomach/Bowel: Stomach is within normal limits. Large amount stool throughout the colon. Sigmoid colon diverticulosis with severe bowel wall thickening and perisigmoid ule inflammatory changes. Peridiverticular complex fluid collection with air within the collection and peripheral enhancement measuring 3.8 x 3.5 x 3.9 cm abutting the dome of the bladder. 1.8 cm primarily air-filled abscess or contained perforation along the superior margin of the sigmoid colon with surrounding inflammatory changes. Vascular/Lymphatic: Abdominal aortic atherosclerosis. Infrarenal abdominal aortic ectasia measuring 2.9 x 2.7 cm. 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 Reproductive: Prostate is unremarkable. Other: No abdominal wall hernia or abnormality. No abdominopelvic ascites. Musculoskeletal: No acute osseous abnormality. No aggressive osseous lesion. IMPRESSION: 1. Persistent diverticulitis of the sigmoid colon. Peridiverticular abscess measuring 3.8 x 3.5 x 3.9 cm abutting the dome of the bladder which has enlarged compared with the prior examination. 2. 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 Recommend followup by ultrasound in 5 years. This recommendation follows ACR consensus guidelines: White Paper of the ACR Incidental Findings Committee II on Vascular Findings. J Am Coll Radiol 2013; 10:789-794. Electronically Signed   By: Kathreen Devoid   On: 11/25/2017 15:19   Portable Chest 1 View  Result Date: 11/26/2017 CLINICAL DATA:  Shortness of Breath EXAM: PORTABLE CHEST 1 VIEW COMPARISON:  10/30/2017 FINDINGS: Prior CABG. Mild cardiomegaly. Lungs clear. No effusions or pneumothorax. No acute bony abnormality. IMPRESSION: No active disease. Electronically Signed   By: Rolm Baptise M.D.   On: 11/26/2017 10:08    Labs:  CBC: Recent Labs    11/27/17 0654 11/28/17 0600  11/29/17 0744 11/30/17 0837  WBC 5.9 4.8 7.1 5.4  HGB 10.7* 9.8* 12.1* 10.8*  HCT 33.7* 30.7* 38.0* 33.6*  PLT 325 291 407* 300    COAGS: Recent Labs    10/25/17 0341 10/26/17 1517  11/27/17 1347 11/28/17 0600 11/29/17 0744 11/30/17 0837  INR 1.16 1.46   < > 2.37 2.93 2.18 2.60  APTT 77* 39*  --   --   --   --   --    < > = values in this interval not displayed.    BMP: Recent Labs    11/27/17 0654 11/28/17 0600 11/29/17 0744 11/30/17 0837  NA 135 135 135 135  K 3.6 4.0 3.7 3.6  CL 102 104 99* 99*  CO2 25 22 23 25   GLUCOSE 105* 88 109* 97  BUN 11 9 7 6   CALCIUM 9.0 8.9 9.4 9.0  CREATININE 1.13 1.18 1.32* 1.18  GFRNONAA >60 >60 53* >60  GFRAA >60 >60 >60 >60  LIVER FUNCTION TESTS: Recent Labs    10/07/17 1541 10/22/17 0838 11/26/17 0812  BILITOT 0.5 0.6 0.7  AST 17 19 16   ALT 10* 7* 11*  ALKPHOS 77 58 75  PROT 7.4 7.1 7.0  ALBUMIN 3.2* 3.1* 2.8*    TUMOR MARKERS: No results for input(s): AFPTM, CEA, CA199, CHROMGRNA in the last 8760 hours.  Assessment and Plan:  Diverticular abscess Recent NSTEMI/CABG 10/26/17 Coumadin held now INR 2.6 today Scheduled for diverticular abscess drain placement Will follow INR Need close to 1.5 to safely proceed Risks and benefits discussed with the patient including bleeding, infection, damage to adjacent structures, bowel perforation/fistula connection, and sepsis. All of the patient's questions were answered, patient is agreeable to proceed. Consent signed and in chart.   Thank you for this interesting consult.  I greatly enjoyed meeting [place] and look forward to participating in their care.  A copy of this report was sent to the requesting provider on this date.  Electronically Signed: Lavonia Drafts, PA-C 11/30/2017, 9:43 AM   I spent a total of 40 Minutes    in face to face in clinical consultation, greater than 50% of which was counseling/coordinating care for diverticular abscess drain  placement

## 2017-12-01 LAB — BASIC METABOLIC PANEL
ANION GAP: 12 (ref 5–15)
BUN: 6 mg/dL (ref 6–20)
CALCIUM: 8.8 mg/dL — AB (ref 8.9–10.3)
CO2: 24 mmol/L (ref 22–32)
Chloride: 99 mmol/L — ABNORMAL LOW (ref 101–111)
Creatinine, Ser: 1.2 mg/dL (ref 0.61–1.24)
GFR calc non Af Amer: 60 mL/min — ABNORMAL LOW (ref >60)
Glucose, Bld: 94 mg/dL (ref 65–99)
POTASSIUM: 3.9 mmol/L (ref 3.5–5.1)
Sodium: 135 mmol/L (ref 135–145)

## 2017-12-01 LAB — PREPARE FRESH FROZEN PLASMA
UNIT DIVISION: 0
Unit division: 0

## 2017-12-01 LAB — CBC
HEMATOCRIT: 31.2 % — AB (ref 39.0–52.0)
HEMOGLOBIN: 9.8 g/dL — AB (ref 13.0–17.0)
MCH: 27.5 pg (ref 26.0–34.0)
MCHC: 31.4 g/dL (ref 30.0–36.0)
MCV: 87.6 fL (ref 78.0–100.0)
Platelets: 263 10*3/uL (ref 150–400)
RBC: 3.56 MIL/uL — AB (ref 4.22–5.81)
RDW: 16.3 % — AB (ref 11.5–15.5)
WBC: 6.4 10*3/uL (ref 4.0–10.5)

## 2017-12-01 LAB — BPAM FFP
BLOOD PRODUCT EXPIRATION DATE: 201812282359
Blood Product Expiration Date: 201812282359
ISSUE DATE / TIME: 201812261811
ISSUE DATE / TIME: 201812261622
UNIT TYPE AND RH: 8400
Unit Type and Rh: 8400

## 2017-12-01 LAB — PROTIME-INR
INR: 2.05
INR: 1.39
Prothrombin Time: 23 seconds — ABNORMAL HIGH (ref 11.4–15.2)
Prothrombin Time: 17 seconds — ABNORMAL HIGH (ref 11.4–15.2)

## 2017-12-01 MED ORDER — SIMETHICONE 80 MG PO CHEW
80.0000 mg | CHEWABLE_TABLET | Freq: Four times a day (QID) | ORAL | Status: AC
  Administered 2017-12-01: 80 mg via ORAL
  Filled 2017-12-01 (×6): qty 1

## 2017-12-01 MED ORDER — VITAMIN K1 10 MG/ML IJ SOLN
2.5000 mg | Freq: Once | INTRAMUSCULAR | Status: AC
  Administered 2017-12-01: 2.5 mg via INTRAVENOUS
  Filled 2017-12-01: qty 0.25

## 2017-12-01 MED ORDER — SODIUM CHLORIDE 0.9 % IV SOLN: Freq: Once | INTRAVENOUS | Status: DC

## 2017-12-01 NOTE — Progress Notes (Signed)
PROGRESS NOTE    Dean [name]  ZOX:[id number] DOB: 23-Nov-1946 DOA: 11/26/2017 PCP: [name], PA-C    Brief Narrative:  Patient is 71 year old male with history of recurrent diverticulitis presenting with abdominal pain, diverticular abscess, persistent diverticulitis.  Patient with recent STEMI and CABG.  Coumadin on hold for atrial fibrillation that has been managed as outpatient for sigmoid diverticulitis.  Continue empiric IV antibiotics.  General surgery following and currently recommending percutaneous drain placement.   Assessment & Plan:   Principal Problem:   Diverticulitis Active Problems:   Colonic diverticular abscess   Iron deficiency anemia   S/P CABG x 4   Atrial fibrillation (HCC) [I48.91]   Abscess of abdominal cavity (HCC)   Acute diverticulitis   SOB (shortness of breath)  #1 acute diverticulitis with diverticular abscess Patient with multiple episodes of acute diverticulitis.  October 07, 2017 patient had CT scan that showed focally perforated sigmoid diverticulitis at which time patient was admitted followed by general surgery and discharged on oral ciprofloxacin and Flagyl.  Patient also noted to have another bout of acute diverticulitis 10/22/2017 at which time he was treated in the outpatient setting.  Patient presented back with abnormal CT of abdomen and pelvis concerning for acute diverticulitis with diverticular abscess.Seen in consultation by general surgery who is recommending IV antibiotics and percutaneous drainage of diverticular abscess with cultures to be done. INR therapeutic and currently at 2.05 from 2.18 from 2.93 from 2.37.  Patient status post 2 units of FFP 11/28/2017, and 2 units of FFP 11/30/2017 in hopes of reversal of Coumadin.  Continue empiric IV Zosyn.  We will give another 2 units FFP and 2.5 mg of IV vitamin K x1. Awaiting percutaneous drain placement.  Per general surgery.  Follow.  2.  Coronary artery disease/status post CABG  x4 11/02/2017/ischemic cardiomyopathy/atrial fibrillation Currently stable.  Continue home regimen of amiodarone 100 mg daily.  Once INR is subtherapeutic will place on baby aspirin 81 mg daily per cardiology recommendations.  Due to patient's recent CABG cardiology was consulted and followed the patient. CVTS has been notified of patient's admission.  3.  Chronic anticoagulation Coumadin was on hold currently on hold in anticipation of percutaneous drain.  INR was at 2.18 after 2 units of FFP on 11/29/2017. PT/INR today is 2.05.  We will give another 2 units of FFP for reversal.  Vitamin K 2.5 mg IV x1.  Hopefully INR should be less than 1.5 tomorrow so patient can get his percutaneous drain placed. Once drain has been placed general surgery to advise when anticoagulation may be resumed.  4.  Anemia Hemoglobin currently stable at 9.8. Follow H&H.   DVT prophylaxis: SCDs Code Status: Full Family Communication: Updated patient.  No family at bedside. Disposition Plan: Home once clinically stable and medically improved, after drain placement and per general surgery.   Consultants:   Cardiology: Dr. Marlou Porch 11/27/2017  General surgery: Dr. Lucia Gaskins 11/26/2017  Interventional radiology 11/30/2017  Procedures:   Chest x-ray 11/26/2017  2 units FFP 11/28/2017  2 units FFP 11/30/2017  2 units FFP 12/01/2017  Antimicrobials:   IV Zosyn 11/26/2017   Subjective: Patient sitting on couch on his computer.  Denies any abdominal pain.  No chest pain no shortness of breath.  Patient states no significant change from yesterday.  Patient stated he was on colchicine early on as he was having knee and elbow pain which is currently improving.  Patient anxious and frustrated as his INR is not coming down as  quickly as he would like an order for him to get his drain placed.  Patient asking when he can be advanced to a soft diet.    Objective: Vitals:   12/01/17 1051 12/01/17 1105 12/01/17 1210  12/01/17 1231  BP: 129/68 122/65 133/68 133/67  Pulse: 63 61 60 (!) 59  Resp: 16 16 16 16   Temp: 97.6 F (36.4 C) 97.7 F (36.5 C) 98.2 F (36.8 C) 98.3 F (36.8 C)  TempSrc: Oral Oral Oral Oral  SpO2: 100% 100% 100% 100%  Weight:      Height:        Intake/Output Summary (Last 24 hours) at 12/01/2017 1243 Last data filed at 12/01/2017 1210 Gross per 24 hour  Intake 1766 ml  Output 750 ml  Net 1016 ml   Filed Weights   11/29/17 2130 11/30/17 0500 11/30/17 2121  Weight: 67.9 kg (149 lb 11.1 oz) 67.9 kg (149 lb 11.1 oz) 67.8 kg (149 lb 7.6 oz)    Examination:  General exam: NAD Respiratory system: Lungs are clear to auscultation bilaterally.  No wheezing, no rhonchi, no crackles. Cardiovascular system: Regular rate and rhythm no murmurs rubs or gallops.  No JVD.  No lower extremity edema.  Sternal scar clean/dry/intact.  Gastrointestinal system: Abdomen is nontender, nondistended, soft, positive bowel sounds.  No rebound.  No guarding.  No hepatosplenomegaly.  Central nervous system: Alert and oriented. No focal neurological deficits. Extremities: Symmetric 5 x 5 power. Skin: No rashes, lesions or ulcers Psychiatry: Judgement and insight appear normal. Mood & affect appropriate.     Data Reviewed: I have personally reviewed following labs and imaging studies  CBC: Recent Labs  Lab 11/27/17 0654 11/28/17 0600 11/29/17 0744 11/30/17 0837 12/01/17 0713  WBC 5.9 4.8 7.1 5.4 6.4  HGB 10.7* 9.8* 12.1* 10.8* 9.8*  HCT 33.7* 30.7* 38.0* 33.6* 31.2*  MCV 88.2 87.7 88.6 87.7 87.6  PLT 325 291 407* 300 779   Basic Metabolic Panel: Recent Labs  Lab 11/27/17 0654 11/27/17 0845 11/28/17 0600 11/29/17 0744 11/30/17 0837 12/01/17 0713  NA 135  --  135 135 135 135  K 3.6  --  4.0 3.7 3.6 3.9  CL 102  --  104 99* 99* 99*  CO2 25  --  22 23 25 24   GLUCOSE 105*  --  88 109* 97 94  BUN 11  --  9 7 6 6   CREATININE 1.13  --  1.18 1.32* 1.18 1.20  CALCIUM 9.0  --  8.9  9.4 9.0 8.8*  MG  --  1.9  --   --   --   --    GFR: Estimated Creatinine Clearance: 55.7 mL/min (by C-G formula based on SCr of 1.2 mg/dL). Liver Function Tests: Recent Labs  Lab 11/26/17 0812  AST 16  ALT 11*  ALKPHOS 75  BILITOT 0.7  PROT 7.0  ALBUMIN 2.8*   Recent Labs  Lab 11/26/17 0812  LIPASE 30   No results for input(s): AMMONIA in the last 168 hours. Coagulation Profile: Recent Labs  Lab 11/27/17 1347 11/28/17 0600 11/29/17 0744 11/30/17 0837 12/01/17 0713  INR 2.37 2.93 2.18 2.60 2.05   Cardiac Enzymes: Recent Labs  Lab 11/26/17 0812  TROPONINI 0.03*   BNP (last 3 results) No results for input(s): PROBNP in the last 8760 hours. HbA1C: No results for input(s): HGBA1C in the last 72 hours. CBG: No results for input(s): GLUCAP in the last 168 hours. Lipid Profile: No results for input(s):  CHOL, HDL, LDLCALC, TRIG, CHOLHDL, LDLDIRECT in the last 72 hours. Thyroid Function Tests: No results for input(s): TSH, T4TOTAL, FREET4, T3FREE, THYROIDAB in the last 72 hours. Anemia Panel: No results for input(s): VITAMINB12, FOLATE, FERRITIN, TIBC, IRON, RETICCTPCT in the last 72 hours. Sepsis Labs: No results for input(s): PROCALCITON, LATICACIDVEN in the last 168 hours.  No results found for this or any previous visit (from the past 240 hour(s)).       Radiology Studies: No results found.      Scheduled Meds: . amiodarone  100 mg Oral Daily  . febuxostat  40 mg Oral Daily  . simethicone  80 mg Oral QID   Continuous Infusions: . sodium chloride 50 mL/hr at 12/01/17 1057  . sodium chloride    . sodium chloride    . piperacillin-tazobactam (ZOSYN)  IV Stopped (12/01/17 3546)     LOS: 5 days    Time spent: 35 minutes    Irine Seal, MD [facility] Pager [phone]  If 7PM-7AM, please contact night-coverage [email or web address] Password [facility] 12/01/2017, 12:43 PM

## 2017-12-01 NOTE — Progress Notes (Signed)
Patient ID: Dean [name], male   DOB: Feb 24, 1946, 71 y.o.   MRN: [id number]   INR 2.05 Plan for divertic abscess drain 12/28 in IR  Will check INR in am

## 2017-12-01 NOTE — Progress Notes (Signed)
   Subjective/Chief Complaint: Comfortable this morning Minimal pain   Objective: Vital signs in last 24 hours: Temp:  [97.5 F (36.4 C)-98.7 F (37.1 C)] 98.7 F (37.1 C) (12/27 0514) Pulse Rate:  [59-77] 77 (12/27 0514) Resp:  [16-20] 20 (12/27 0514) BP: (121-141)/(54-80) 121/54 (12/27 0514) SpO2:  [98 %-100 %] 100 % (12/27 0514) Weight:  [67.8 kg (149 lb 7.6 oz)] 67.8 kg (149 lb 7.6 oz) (12/26 2121) Last BM Date: 11/30/17  Intake/Output from previous day: 12/26 0701 - 12/27 0700 In: 1715.2 [P.O.:240; I.V.:799.2; Blood:576; IV Piggyback:100] Out: 800 [Urine:800] Intake/Output this shift: No intake/output data recorded.  Exam: Looks comfortable Abdomen soft with only mild LLQ tenderness  Lab Results:  Recent Labs    11/29/17 0744 11/30/17 0837  WBC 7.1 5.4  HGB 12.1* 10.8*  HCT 38.0* 33.6*  PLT 407* 300   BMET Recent Labs    11/29/17 0744 11/30/17 0837  NA 135 135  K 3.7 3.6  CL 99* 99*  CO2 23 25  GLUCOSE 109* 97  BUN 7 6  CREATININE 1.32* 1.18  CALCIUM 9.4 9.0   PT/INR Recent Labs    11/29/17 0744 11/30/17 0837  LABPROT 24.1* 27.6*  INR 2.18 2.60   ABG No results for input(s): PHART, HCO3 in the last 72 hours.  Invalid input(s): PCO2, PO2  Studies/Results: No results found.  Anti-infectives: Anti-infectives (From admission, onward)   Start     Dose/Rate Route Frequency Ordered Stop   11/26/17 1630  piperacillin-tazobactam (ZOSYN) IVPB 3.375 g     3.375 g 12.5 mL/hr over 240 Minutes Intravenous Every 8 hours 11/26/17 1032     11/26/17 0800  piperacillin-tazobactam (ZOSYN) IVPB 3.375 g     3.375 g 100 mL/hr over 30 Minutes Intravenous  Once 11/26/17 0755 11/26/17 9604      Assessment/Plan: Diverticulitis with abscess, recent MI and CABG  For IR drain placement today depending on INR. Continue abx  LOS: 5 days    Candia Kingsbury A 12/01/2017

## 2017-12-02 ENCOUNTER — Inpatient Hospital Stay (HOSPITAL_COMMUNITY): Payer: Medicare Other

## 2017-12-02 DIAGNOSIS — R0602 Shortness of breath: Secondary | ICD-10-CM

## 2017-12-02 DIAGNOSIS — K5720 Diverticulitis of large intestine with perforation and abscess without bleeding: Principal | ICD-10-CM

## 2017-12-02 LAB — BPAM FFP
BLOOD PRODUCT EXPIRATION DATE: 201812272359
BLOOD PRODUCT EXPIRATION DATE: 201812272359
BLOOD PRODUCT EXPIRATION DATE: 201812282359
Blood Product Expiration Date: 201812282359
ISSUE DATE / TIME: 201812270924
ISSUE DATE / TIME: 201812270924
ISSUE DATE / TIME: 201812271042
ISSUE DATE / TIME: 201812271210
UNIT TYPE AND RH: 6200
Unit Type and Rh: 6200
Unit Type and Rh: 8400
Unit Type and Rh: 8400

## 2017-12-02 LAB — PREPARE FRESH FROZEN PLASMA
UNIT DIVISION: 0
UNIT DIVISION: 0
Unit division: 0
Unit division: 0

## 2017-12-02 LAB — PROTIME-INR
INR: 1.3
Prothrombin Time: 16.1 seconds — ABNORMAL HIGH (ref 11.4–15.2)

## 2017-12-02 LAB — BASIC METABOLIC PANEL
ANION GAP: 10 (ref 5–15)
BUN: 6 mg/dL (ref 6–20)
CO2: 22 mmol/L (ref 22–32)
Calcium: 8.6 mg/dL — ABNORMAL LOW (ref 8.9–10.3)
Chloride: 102 mmol/L (ref 101–111)
Creatinine, Ser: 1.11 mg/dL (ref 0.61–1.24)
GFR calc non Af Amer: >60 mL/min (ref >60)
GLUCOSE: 88 mg/dL (ref 65–99)
POTASSIUM: 3.3 mmol/L — AB (ref 3.5–5.1)
Sodium: 134 mmol/L — ABNORMAL LOW (ref 135–145)

## 2017-12-02 LAB — CBC
HEMATOCRIT: 28.5 % — AB (ref 39.0–52.0)
HEMOGLOBIN: 9.2 g/dL — AB (ref 13.0–17.0)
MCH: 28.4 pg (ref 26.0–34.0)
MCHC: 32.3 g/dL (ref 30.0–36.0)
MCV: 88 fL (ref 78.0–100.0)
Platelets: 231 10*3/uL (ref 150–400)
RBC: 3.24 MIL/uL — AB (ref 4.22–5.81)
RDW: 16.6 % — ABNORMAL HIGH (ref 11.5–15.5)
WBC: 5.1 10*3/uL (ref 4.0–10.5)

## 2017-12-02 MED ORDER — IOPAMIDOL (ISOVUE-300) INJECTION 61%
INTRAVENOUS | Status: AC
  Filled 2017-12-02: qty 75

## 2017-12-02 MED ORDER — WARFARIN SODIUM 2 MG PO TABS
2.0000 mg | ORAL_TABLET | Freq: Once | ORAL | Status: AC
  Administered 2017-12-02: 2 mg via ORAL
  Filled 2017-12-02: qty 1

## 2017-12-02 MED ORDER — WARFARIN SODIUM 2.5 MG PO TABS
2.5000 mg | ORAL_TABLET | Freq: Once | ORAL | Status: DC
  Filled 2017-12-02: qty 1

## 2017-12-02 MED ORDER — SODIUM CHLORIDE 0.9 % IV SOLN
INTRAVENOUS | Status: DC
  Administered 2017-12-02 – 2017-12-03 (×2): via INTRAVENOUS

## 2017-12-02 MED ORDER — FENTANYL CITRATE (PF) 100 MCG/2ML IJ SOLN
INTRAMUSCULAR | Status: AC | PRN
  Administered 2017-12-02 (×2): 50 ug via INTRAVENOUS

## 2017-12-02 MED ORDER — MIDAZOLAM HCL 2 MG/2ML IJ SOLN
INTRAMUSCULAR | Status: AC | PRN
  Administered 2017-12-02: 0.5 mg via INTRAVENOUS
  Administered 2017-12-02 (×2): 1 mg via INTRAVENOUS

## 2017-12-02 MED ORDER — WARFARIN - PHARMACIST DOSING INPATIENT
Freq: Every day | Status: DC
  Administered 2017-12-03: 17:00:00

## 2017-12-02 MED ORDER — FENTANYL CITRATE (PF) 100 MCG/2ML IJ SOLN
INTRAMUSCULAR | Status: AC
  Filled 2017-12-02: qty 4

## 2017-12-02 MED ORDER — LIDOCAINE HCL 1 % IJ SOLN
INTRAMUSCULAR | Status: AC
  Filled 2017-12-02: qty 20

## 2017-12-02 MED ORDER — MIDAZOLAM HCL 2 MG/2ML IJ SOLN
INTRAMUSCULAR | Status: AC
  Filled 2017-12-02: qty 6

## 2017-12-02 NOTE — Progress Notes (Signed)
ANTICOAGULATION CONSULT NOTE - Initial Consult  Pharmacy Consult for warfarin Indication: atrial fibrillation  Allergies  Allergen Reactions  . Aspirin Other (See Comments)    Will flare up gout Will flare up gout  . Crestor [Rosuvastatin Calcium]     Reported intolerance to Lipitor in the past Crestor 40mg  patient reported muscle aches, dose reduced to rechallenge    Patient Measurements: Height: 5' 9.5" (176.5 cm) Weight: 149 lb 0.5 oz (67.6 kg) IBW/kg (Calculated) : 71.85  Vital Signs: Temp: 98.3 F (36.8 C) (12/28 0908) Temp Source: Oral (12/28 0908) BP: 147/82 (12/28 1442) Pulse Rate: 68 (12/28 1442)  Labs: Recent Labs    11/30/17 0837 12/01/17 0713 12/01/17 1520 12/02/17 0626  HGB 10.8* 9.8*  --  9.2*  HCT 33.6* 31.2*  --  28.5*  PLT 300 263  --  231  LABPROT 27.6* 23.0* 17.0* 16.1*  INR 2.60 2.05 1.39 1.30  CREATININE 1.18 1.20  --  1.11    Estimated Creatinine Clearance: 60.1 mL/min (by C-G formula based on SCr of 1.11 mg/dL).   Medical History: Past Medical History:  Diagnosis Date  . Acid reflux   . Arthropathy   . Atrial fibrillation ([place]) 10/2017  . Coronary artery disease   . Diverticulitis   . Dizziness   . Fatigue   . Gout   . Hepatitis A age 69  . Hyperlipemia   . Hypogonadism male   . Knee pain   . Night sweats   . Non compliance with medical treatment   . Non-STEMI (non-ST elevated myocardial infarction) ([place]) 10/2017  . Palpitations   . Seasonal allergies   . SOB (shortness of breath)   . Vitamin D deficiency     Medications:  Medications Prior to Admission  Medication Sig Dispense Refill Last Dose  . acetaminophen (TYLENOL) 325 MG tablet Take 325 mg every 6 (six) hours as needed by mouth for mild pain.   unk  . amiodarone (PACERONE) 200 MG tablet Take 1 tablet (200 mg total) by mouth 2 (two) times daily. For one week;then take Amiodarone 200 mg by mouth daily thereafter. (Patient taking differently: Take 100 mg by mouth  daily. ) 60 tablet 1 11/26/2017 at Unknown time  . amoxicillin-clavulanate (AUGMENTIN) 875-125 MG tablet Take 1 tablet by mouth 2 (two) times daily. 60 tablet 0 11/26/2017 at 0500  . aspirin EC 81 MG EC tablet Take 1 tablet (81 mg total) by mouth daily.   11/25/2017 at [place]  . Cholecalciferol (VITAMIN D3) 5000 units CAPS Take 5,000 Units daily by mouth.    Past Week at Unknown time  . colchicine 0.6 MG tablet Take 0.6 mg by mouth daily as needed (gout).    unk  . febuxostat (ULORIC) 40 MG tablet Take 40 mg by mouth daily.   11/25/2017 at Unknown time  . hydrocortisone cream 1 % Apply 1 application topically as needed for itching. Do NOT apply to sternal wound 30 g 0 Past Month at Unknown time  . warfarin (COUMADIN) 1 MG tablet Take 1 tablet (1 mg total) by mouth daily. Or as directed. (Patient taking differently: Take 1 mg by mouth daily. 2 mg on Monday, Wednesday and Friday. 1 mg all other days) 30 tablet 1 11/25/2017 at 2000    Assessment: 71 y/o male admitted 11/26/2017 with abnormal CT showing large amount of diverticular abscesses with persistent diverticulitis. In 10/2017 patient had CABG and developed post-op Afib. He was started on warfarin. Pharmacy consulted to resume warfarin.  He is now s/p percutaneous drain placement.  INR down to 1.3 s/p vitamin K 2.5 mg IV on 12/27. No bleeding noted, Hgb down to 9.2 but fairly stable, platelets are normal.  PTA regimen: 1 mg daily except 2 mg MWF  Goal of Therapy:  INR 2-3 Monitor platelets by anticoagulation protocol: Yes   Plan:  Warfarin 2.5 mg PO tonight Daily INR Monitor for s/sx of bleeding   Renold Genta, PharmD, BCPS Clinical Pharmacist Phone for today - [place] - [phone] 12/02/2017 2:53 PM

## 2017-12-02 NOTE — Sedation Documentation (Signed)
Patient denies pain and is resting comfortably.  

## 2017-12-02 NOTE — Procedures (Signed)
Interventional Radiology Procedure Note  Procedure: CT guided drainage of pelvic abscess.  ~20cc of purulent material aspirated. 4F drain placed to bulb.  Complications: None Recommendations:  - Continue to bulb.  - Record daily output. - Irrigate with 5-10cc sterile saline flushes BID-TID - Do not submerge - Routine drain care   Signed,  Dulcy Fanny. Earleen Newport, DO

## 2017-12-02 NOTE — Progress Notes (Signed)
PROGRESS NOTE    Dean [name]  ENI:[id number] DOB: Oct 18, 1946 DOA: 11/26/2017 PCP: [name], PA-C    Brief Narrative:  71 year old male with history of recurrent diverticulitis presenting with abdominal pain, diverticular abscess, persistent diverticulitis.  Patient with recent STEMI and CABG.  Coumadin on hold for atrial fibrillation that has been managed as outpatient for sigmoid diverticulitis.  Continue empiric IV antibiotics.  General surgery following. Pt is now s/p percutaneous drain placement.  Assessment & Plan:   Principal Problem:   Diverticulitis Active Problems:   Iron deficiency anemia   S/P CABG x 4   Atrial fibrillation (HCC) [I48.91]   Colonic diverticular abscess   Abscess of abdominal cavity (HCC)   Acute diverticulitis   SOB (shortness of breath)  #1 acute diverticulitis with diverticular abscess Patient with multiple episodes of acute diverticulitis.  October 07, 2017 patient had CT scan that showed focally perforated sigmoid diverticulitis at which time patient was admitted followed by general surgery and discharged on oral ciprofloxacin and Flagyl.  Patient also noted to have another bout of acute diverticulitis 10/22/2017 at which time he was treated in the outpatient setting.  Patient presented back with abnormal CT of abdomen and pelvis concerning for acute diverticulitis with diverticular abscess.Seen in consultation by general surgery who is recommending IV antibiotics and percutaneous drainage of diverticular abscess with cultures to be done. INR therapeutic and currently at 2.05 from 2.18 from 2.93 from 2.37.  Patient status post 2 units of FFP 11/28/2017, and 2 units of FFP 11/30/2017 in hopes of reversal of Coumadin.  Continue on empiric IV Zosyn.  INR today 1.3 and patient has undergone   2.  Coronary artery disease/status post CABG x4 11/02/2017/ischemic cardiomyopathy/atrial fibrillation Currently stable.  Continue home regimen of amiodarone  100 mg daily.  Once INR is subtherapeutic will place on baby aspirin 81 mg daily per cardiology recommendations.  Due to patient's recent CABG cardiology was consulted and followed the patient. CVTS has been notified of patient's admission. Remains stable  3.  Chronic anticoagulation Coumadin was on hold currently on hold in anticipation of percutaneous drain.  INR has since been reversed with FFP and vit K. OK to resume coumadin per IR. Will resume per pharmacy dosing  4.  Anemia Hemoglobin currently stable at 9.8. Follow H&H.  DVT prophylaxis: coumadin Code Status: Full Family Communication: Pt in room, family not at bedside Disposition Plan: Possible discharge in 24-48hrs  Consultants:   Cardiology  IR  Procedures:   Percutaneous cholecystostomy tube placement 12/02/17  Antimicrobials: Anti-infectives (From admission, onward)   Start     Dose/Rate Route Frequency Ordered Stop   11/26/17 1630  piperacillin-tazobactam (ZOSYN) IVPB 3.375 g     3.375 g 12.5 mL/hr over 240 Minutes Intravenous Every 8 hours 11/26/17 1032     11/26/17 0800  piperacillin-tazobactam (ZOSYN) IVPB 3.375 g     3.375 g 100 mL/hr over 30 Minutes Intravenous  Once 11/26/17 0755 11/26/17 0903       Subjective: Eager to go home  Objective: Vitals:   12/02/17 1435 12/02/17 1440 12/02/17 1442 12/02/17 1508  BP: 138/77 (!) 143/74 (!) 147/82 (!) 149/73  Pulse: 67 60 68 (!) 57  Resp: 13 20 19 19   Temp:    97.9 F (36.6 C)  TempSrc:    Oral  SpO2: 99% 96% 98% 99%  Weight:      Height:        Intake/Output Summary (Last 24 hours) at 12/02/2017 1746 Last data  filed at 12/02/2017 0909 Gross per 24 hour  Intake 1355.83 ml  Output 1060 ml  Net 295.83 ml   Filed Weights   11/30/17 0500 11/30/17 2121 12/01/17 2019  Weight: 67.9 kg (149 lb 11.1 oz) 67.8 kg (149 lb 7.6 oz) 67.6 kg (149 lb 0.5 oz)    Examination:  General exam: Appears calm and comfortable  Respiratory system: Clear to  auscultation. Respiratory effort normal. Cardiovascular system: S1 & S2 heard, RRR. Gastrointestinal system: Abdomen is nondistended, soft and nontender. No organomegaly or masses felt. Normal bowel sounds heard. Central nervous system: Alert and oriented. No focal neurological deficits. Extremities: Symmetric 5 x 5 power. Skin: No rashes, lesions  Psychiatry: Judgement and insight appear normal. Mood & affect appropriate.   Data Reviewed: I have personally reviewed following labs and imaging studies  CBC: Recent Labs  Lab 11/28/17 0600 11/29/17 0744 11/30/17 0837 12/01/17 0713 12/02/17 0626  WBC 4.8 7.1 5.4 6.4 5.1  HGB 9.8* 12.1* 10.8* 9.8* 9.2*  HCT 30.7* 38.0* 33.6* 31.2* 28.5*  MCV 87.7 88.6 87.7 87.6 88.0  PLT 291 407* 300 263 956   Basic Metabolic Panel: Recent Labs  Lab 11/27/17 0845 11/28/17 0600 11/29/17 0744 11/30/17 0837 12/01/17 0713 12/02/17 0626  NA  --  135 135 135 135 134*  K  --  4.0 3.7 3.6 3.9 3.3*  CL  --  104 99* 99* 99* 102  CO2  --  22 23 25 24 22   GLUCOSE  --  88 109* 97 94 88  BUN  --  9 7 6 6 6   CREATININE  --  1.18 1.32* 1.18 1.20 1.11  CALCIUM  --  8.9 9.4 9.0 8.8* 8.6*  MG 1.9  --   --   --   --   --    GFR: Estimated Creatinine Clearance: 60.1 mL/min (by C-G formula based on SCr of 1.11 mg/dL). Liver Function Tests: Recent Labs  Lab 11/26/17 0812  AST 16  ALT 11*  ALKPHOS 75  BILITOT 0.7  PROT 7.0  ALBUMIN 2.8*   Recent Labs  Lab 11/26/17 0812  LIPASE 30   No results for input(s): AMMONIA in the last 168 hours. Coagulation Profile: Recent Labs  Lab 11/29/17 0744 11/30/17 0837 12/01/17 0713 12/01/17 1520 12/02/17 0626  INR 2.18 2.60 2.05 1.39 1.30   Cardiac Enzymes: Recent Labs  Lab 11/26/17 0812  TROPONINI 0.03*   BNP (last 3 results) No results for input(s): PROBNP in the last 8760 hours. HbA1C: No results for input(s): HGBA1C in the last 72 hours. CBG: No results for input(s): GLUCAP in the last 168  hours. Lipid Profile: No results for input(s): CHOL, HDL, LDLCALC, TRIG, CHOLHDL, LDLDIRECT in the last 72 hours. Thyroid Function Tests: No results for input(s): TSH, T4TOTAL, FREET4, T3FREE, THYROIDAB in the last 72 hours. Anemia Panel: No results for input(s): VITAMINB12, FOLATE, FERRITIN, TIBC, IRON, RETICCTPCT in the last 72 hours. Sepsis Labs: No results for input(s): PROCALCITON, LATICACIDVEN in the last 168 hours.  Recent Results (from the past 240 hour(s))  Aerobic/Anaerobic Culture (surgical/deep wound)     Status: None (Preliminary result)   Collection Time: 12/02/17  3:01 PM  Result Value Ref Range Status   Specimen Description ABSCESS  Final   Special Requests NONE  Final   Gram Stain   Final    ABUNDANT WBC PRESENT, PREDOMINANTLY PMN RARE GRAM VARIABLE ROD    Culture PENDING  Incomplete   Report Status PENDING  Incomplete  Radiology Studies: Ct Image Guided Drainage By Percutaneous Catheter  Result Date: 12/02/2017 INDICATION: 71 year old male with a history of diverticulitis and abscess EXAM: CT GUIDED DRAINAGE OF PELVIC ABSCESS MEDICATIONS: The patient is currently admitted to the hospital and receiving intravenous antibiotics. The antibiotics were administered within an appropriate time frame prior to the initiation of the procedure. ANESTHESIA/SEDATION: 2.5 mg IV Versed 100 mcg IV Fentanyl Moderate Sedation Time:  10 minutes The patient was continuously monitored during the procedure by the interventional radiology nurse under my direct supervision. COMPLICATIONS: None TECHNIQUE: Informed written consent was obtained from the patient after a thorough discussion of the procedural risks, benefits and alternatives. All questions were addressed. Maximal Sterile Barrier Technique was utilized including caps, mask, sterile gowns, sterile gloves, sterile drape, hand hygiene and skin antiseptic. A timeout was performed prior to the initiation of the procedure. PROCEDURE: The  lower abdomen was prepped with chlorhexidine in a sterile fashion, and a sterile drape was applied covering the operative field. A sterile gown and sterile gloves were used for the procedure. Local anesthesia was provided with 1% Lidocaine. Scout CT of the pelvis was performed for planning purposes. The patient was then prepped and draped in the usual sterile fashion and 1% lidocaine was used for local anesthesia. Using CT guidance, trocar needle was advanced into the pelvic abscess just superior to the bladder dome. Once we confirmed the needle tip position, modified Seldinger technique was used to place a 10 French pigtail drain catheter. Approximately 20 cc of purulent material was aspirated. Catheter was formed, sutured in position, an attached to bulb suction. Patient tolerated the procedure well and remained hemodynamically stable throughout. No complications were encountered and no significant blood loss. FINDINGS: Small gas and fluid collection persists just above the urinary bladder, compatible with ongoing pelvic abscess. Pigtail catheter placed into the abscess drained approximately 20 cc of purulent material. IMPRESSION: Status post CT-guided drainage of pelvic abscess. Signed, Dulcy Fanny. Earleen Newport, DO [facility] Electronically Signed   By: Corrie Mckusick D.O.   On: 12/02/2017 15:16    Scheduled Meds: . amiodarone  100 mg Oral Daily  . febuxostat  40 mg Oral Daily  . fentaNYL      . lidocaine      . midazolam      . simethicone  80 mg Oral QID  . warfarin  2.5 mg Oral ONCE-1800  . Warfarin - Pharmacist Dosing Inpatient   Does not apply q1800   Continuous Infusions: . sodium chloride 50 mL/hr at 12/02/17 1422  . piperacillin-tazobactam (ZOSYN)  IV 3.375 g (12/02/17 1522)     LOS: 6 days   Marylu Lund, MD [facility] Pager [phone]  If 7PM-7AM, please contact night-coverage [email or web address] Password [facility] 12/02/2017,  5:46 PM

## 2017-12-02 NOTE — Sedation Documentation (Signed)
Patient is resting comfortably. 

## 2017-12-02 NOTE — Progress Notes (Signed)
Called Lab technician x2 more times,patient's labs have not been drawn yet,patient upset. Informed lab tech patient to have procedure done pending INR.Lab tech to come up. Tyke Outman, Wonda Cheng, Therapist, sports

## 2017-12-02 NOTE — Progress Notes (Signed)
   Subjective/Chief Complaint: Comfortable this morning Minimal pain  Denies n/v Awaiting IR for perc drain   Objective: Vital signs in last 24 hours: Temp:  [97.6 F (36.4 C)-98.7 F (37.1 C)] 98.3 F (36.8 C) (12/28 0908) Pulse Rate:  [59-64] 61 (12/28 0908) Resp:  [16-19] 18 (12/28 0908) BP: (122-140)/(57-77) 135/57 (12/28 0908) SpO2:  [99 %-100 %] 100 % (12/28 0908) Weight:  [67.6 kg (149 lb 0.5 oz)] 67.6 kg (149 lb 0.5 oz) (12/27 2019) Last BM Date: 11/28/17  Intake/Output from previous day: 12/27 0701 - 12/28 0700 In: 1825.8 [P.O.:220; I.V.:1205.8; Blood:250; IV Piggyback:150] Out: 5638 [Urine:1035] Intake/Output this shift: Total I/O In: 0  Out: 175 [Urine:175]  Exam: Looks comfortable Abdomen soft with only mild LLQ tenderness  Lab Results:  Recent Labs    12/01/17 0713 12/02/17 0626  WBC 6.4 5.1  HGB 9.8* 9.2*  HCT 31.2* 28.5*  PLT 263 231   BMET Recent Labs    12/01/17 0713 12/02/17 0626  NA 135 134*  K 3.9 3.3*  CL 99* 102  CO2 24 22  GLUCOSE 94 88  BUN 6 6  CREATININE 1.20 1.11  CALCIUM 8.8* 8.6*   PT/INR Recent Labs    12/01/17 1520 12/02/17 0626  LABPROT 17.0* 16.1*  INR 1.39 1.30   ABG No results for input(s): PHART, HCO3 in the last 72 hours.  Invalid input(s): PCO2, PO2  Studies/Results: No results found.  Anti-infectives: Anti-infectives (From admission, onward)   Start     Dose/Rate Route Frequency Ordered Stop   11/26/17 1630  piperacillin-tazobactam (ZOSYN) IVPB 3.375 g     3.375 g 12.5 mL/hr over 240 Minutes Intravenous Every 8 hours 11/26/17 1032     11/26/17 0800  piperacillin-tazobactam (ZOSYN) IVPB 3.375 g     3.375 g 100 mL/hr over 30 Minutes Intravenous  Once 11/26/17 0755 11/26/17 9373      Assessment/Plan: Diverticulitis with abscess, recent MI and CABG  For IR drain placement today - INR is normal Continue abx   LOS: 6 days    Ileana Roup 12/02/2017

## 2017-12-02 NOTE — Progress Notes (Addendum)
Spoke with patient about his warfarin. He wants to take the same dose that he takes at home. States his INR is sensitive, he was nervous that his INR would spike as it has done previously as an outpatient. I explained our reasoning for increasing his dose. Will change dose to 2 mg per patient request and good rationale.

## 2017-12-02 NOTE — Progress Notes (Signed)
Called Phlebotomist because lab isn't drawn yet and patient is wondering when they're coming to draw it. Spoke with [place]. Lochlan Grygiel, Wonda Cheng, Therapist, sports

## 2017-12-03 DIAGNOSIS — K651 Peritoneal abscess: Secondary | ICD-10-CM

## 2017-12-03 LAB — BASIC METABOLIC PANEL
ANION GAP: 11 (ref 5–15)
BUN: 7 mg/dL (ref 6–20)
CHLORIDE: 101 mmol/L (ref 101–111)
CO2: 24 mmol/L (ref 22–32)
Calcium: 8.5 mg/dL — ABNORMAL LOW (ref 8.9–10.3)
Creatinine, Ser: 1 mg/dL (ref 0.61–1.24)
GFR calc non Af Amer: >60 mL/min (ref >60)
GLUCOSE: 95 mg/dL (ref 65–99)
POTASSIUM: 3.3 mmol/L — AB (ref 3.5–5.1)
Sodium: 136 mmol/L (ref 135–145)

## 2017-12-03 LAB — PROTIME-INR
INR: 1.29
PROTHROMBIN TIME: 16 s — AB (ref 11.4–15.2)

## 2017-12-03 LAB — CBC
HEMATOCRIT: 29.2 % — AB (ref 39.0–52.0)
HEMOGLOBIN: 9.3 g/dL — AB (ref 13.0–17.0)
MCH: 28 pg (ref 26.0–34.0)
MCHC: 31.8 g/dL (ref 30.0–36.0)
MCV: 88 fL (ref 78.0–100.0)
Platelets: 263 10*3/uL (ref 150–400)
RBC: 3.32 MIL/uL — AB (ref 4.22–5.81)
RDW: 16.6 % — ABNORMAL HIGH (ref 11.5–15.5)
WBC: 3.5 10*3/uL — ABNORMAL LOW (ref 4.0–10.5)

## 2017-12-03 MED ORDER — WARFARIN SODIUM 2 MG PO TABS
2.0000 mg | ORAL_TABLET | Freq: Once | ORAL | Status: AC
  Administered 2017-12-03: 2 mg via ORAL
  Filled 2017-12-03: qty 1

## 2017-12-03 MED ORDER — POTASSIUM CHLORIDE CRYS ER 20 MEQ PO TBCR
40.0000 meq | EXTENDED_RELEASE_TABLET | Freq: Once | ORAL | Status: AC
  Administered 2017-12-03: 40 meq via ORAL
  Filled 2017-12-03: qty 2

## 2017-12-03 MED ORDER — SODIUM CHLORIDE 0.9% FLUSH
5.0000 mL | Freq: Three times a day (TID) | INTRAVENOUS | Status: DC
  Administered 2017-12-03 – 2017-12-04 (×2): 5 mL via INTRAVENOUS

## 2017-12-03 MED ORDER — AMOXICILLIN-POT CLAVULANATE 875-125 MG PO TABS
1.0000 | ORAL_TABLET | Freq: Two times a day (BID) | ORAL | Status: DC
  Administered 2017-12-03 – 2017-12-04 (×3): 1 via ORAL
  Filled 2017-12-03 (×3): qty 1

## 2017-12-03 NOTE — Progress Notes (Signed)
[place] for warfarin Indication: atrial fibrillation  Allergies  Allergen Reactions  . Aspirin Other (See Comments)    Will flare up gout Will flare up gout  . Crestor [Rosuvastatin Calcium]     Reported intolerance to Lipitor in the past Crestor 40mg  patient reported muscle aches, dose reduced to rechallenge    Labs: Recent Labs    12/01/17 0713 12/01/17 1520 12/02/17 0626 12/03/17 0530  HGB 9.8*  --  9.2* 9.3*  HCT 31.2*  --  28.5* 29.2*  PLT 263  --  231 263  LABPROT 23.0* 17.0* 16.1* 16.0*  INR 2.05 1.39 1.30 1.29  CREATININE 1.20  --  1.11 1.00    Estimated Creatinine Clearance: 67 mL/min (by C-G formula based on SCr of 1 mg/dL).    Assessment: 71 y/o male admitted 11/26/2017 with abnormal CT showing large amount of diverticular abscesses with persistent diverticulitis. In 10/2017 patient had CABG and developed post-op Afib. He was started on warfarin. Pharmacy consulted to resume warfarin. He is now s/p percutaneous drain placement.  INR down to 1.3 s/p vitamin K 2.5 mg IV on 12/27. No bleeding noted PTA regimen: 1 mg daily except 2 mg MWF  Goal of Therapy:  INR 2-3 Monitor platelets by anticoagulation protocol: Yes   Plan:  Warfarin 2 mg PO tonight (off schedule trying to overcome vitamin K) Daily INR Monitor for s/sx of bleeding  Thank you Anette Guarneri, PharmD [phone] 12/03/2017 11:08 AM

## 2017-12-03 NOTE — Progress Notes (Addendum)
Subjective/Chief Complaint: Comfortable this morning Minimal pain  Denies n/v Successfully drained in IR yesterday   Objective: Vital signs in last 24 hours: Temp:  [97.6 F (36.4 C)-98.6 F (37 C)] 98.2 F (36.8 C) (12/29 1009) Pulse Rate:  [57-68] 60 (12/29 1009) Resp:  [13-21] 18 (12/29 1009) BP: (118-157)/(64-82) 125/77 (12/29 1009) SpO2:  [96 %-100 %] 99 % (12/29 1009) Weight:  [67.9 kg (149 lb 11.1 oz)] 67.9 kg (149 lb 11.1 oz) (12/28 2042) Last BM Date: 11/28/17  Intake/Output from previous day: 12/28 0701 - 12/29 0700 In: 1411.7 [P.O.:480; I.V.:781.7; IV Piggyback:150] Out: 823 [Urine:800; Drains:23] Intake/Output this shift: Total I/O In: 240 [P.O.:240] Out: -   Exam: Looks comfortable Abdomen soft with only mild LLQ tenderness  Lab Results:  Recent Labs    12/02/17 0626 12/03/17 0530  WBC 5.1 3.5*  HGB 9.2* 9.3*  HCT 28.5* 29.2*  PLT 231 263   BMET Recent Labs    12/02/17 0626 12/03/17 0530  NA 134* 136  K 3.3* 3.3*  CL 102 101  CO2 22 24  GLUCOSE 88 95  BUN 6 7  CREATININE 1.11 1.00  CALCIUM 8.6* 8.5*   PT/INR Recent Labs    12/02/17 0626 12/03/17 0530  LABPROT 16.1* 16.0*  INR 1.30 1.29   ABG No results for input(s): PHART, HCO3 in the last 72 hours.  Invalid input(s): PCO2, PO2  Studies/Results: Ct Image Guided Drainage By Percutaneous Catheter  Result Date: 12/02/2017 INDICATION: 71 year old male with a history of diverticulitis and abscess EXAM: CT GUIDED DRAINAGE OF PELVIC ABSCESS MEDICATIONS: The patient is currently admitted to the hospital and receiving intravenous antibiotics. The antibiotics were administered within an appropriate time frame prior to the initiation of the procedure. ANESTHESIA/SEDATION: 2.5 mg IV Versed 100 mcg IV Fentanyl Moderate Sedation Time:  10 minutes The patient was continuously monitored during the procedure by the interventional radiology nurse under my direct supervision. COMPLICATIONS:  None TECHNIQUE: Informed written consent was obtained from the patient after a thorough discussion of the procedural risks, benefits and alternatives. All questions were addressed. Maximal Sterile Barrier Technique was utilized including caps, mask, sterile gowns, sterile gloves, sterile drape, hand hygiene and skin antiseptic. A timeout was performed prior to the initiation of the procedure. PROCEDURE: The lower abdomen was prepped with chlorhexidine in a sterile fashion, and a sterile drape was applied covering the operative field. A sterile gown and sterile gloves were used for the procedure. Local anesthesia was provided with 1% Lidocaine. Scout CT of the pelvis was performed for planning purposes. The patient was then prepped and draped in the usual sterile fashion and 1% lidocaine was used for local anesthesia. Using CT guidance, trocar needle was advanced into the pelvic abscess just superior to the bladder dome. Once we confirmed the needle tip position, modified Seldinger technique was used to place a 10 French pigtail drain catheter. Approximately 20 cc of purulent material was aspirated. Catheter was formed, sutured in position, an attached to bulb suction. Patient tolerated the procedure well and remained hemodynamically stable throughout. No complications were encountered and no significant blood loss. FINDINGS: Small gas and fluid collection persists just above the urinary bladder, compatible with ongoing pelvic abscess. Pigtail catheter placed into the abscess drained approximately 20 cc of purulent material. IMPRESSION: Status post CT-guided drainage of pelvic abscess. Signed, Dulcy Fanny. Earleen Newport, DO [facility] Radiology Electronically Signed   By: Corrie Mckusick D.O.   On: 12/02/2017 15:16  Anti-infectives: Anti-infectives (From admission, onward)   Start     Dose/Rate Route Frequency Ordered Stop   11/26/17 1630  piperacillin-tazobactam (ZOSYN)  IVPB 3.375 g     3.375 g 12.5 mL/hr over 240 Minutes Intravenous Every 8 hours 11/26/17 1032     11/26/17 0800  piperacillin-tazobactam (ZOSYN) IVPB 3.375 g     3.375 g 100 mL/hr over 30 Minutes Intravenous  Once 11/26/17 0755 11/26/17 3428      Assessment/Plan: Diverticulitis with abscess, recent MI and CABG  Transition to PO abx today - Augmentin with planned 7day course Diet as tolerated Continue drain to bulb suction CBC in AM Anticoagulation per medicine team but ok from our standpoint  If tolerates PO abx and WBC remains normal, ok for discharge from our standpoint tomorrow   LOS: 7 days    Ileana Roup 12/03/2017

## 2017-12-03 NOTE — Progress Notes (Signed)
PROGRESS NOTE    Dean [name]  XVQ:[id number] DOB: 1946-06-30 DOA: 11/26/2017 PCP: [name], PA-C    Brief Narrative:  71 year old male with history of recurrent diverticulitis presenting with abdominal pain, diverticular abscess, persistent diverticulitis.  Patient with recent STEMI and CABG.  Coumadin on hold for atrial fibrillation that has been managed as outpatient for sigmoid diverticulitis.  Continue empiric IV antibiotics.  General surgery following. Pt is now s/p percutaneous drain placement.  Assessment & Plan:   Principal Problem:   Diverticulitis Active Problems:   Iron deficiency anemia   S/P CABG x 4   Atrial fibrillation (HCC) [I48.91]   Colonic diverticular abscess   Abscess of abdominal cavity (HCC)   Acute diverticulitis   SOB (shortness of breath)  #1 acute diverticulitis with diverticular abscess Patient with multiple episodes of acute diverticulitis.  October 07, 2017 patient had CT scan that showed focally perforated sigmoid diverticulitis at which time patient was admitted followed by general surgery and discharged on oral ciprofloxacin and Flagyl.  Patient also noted to have another bout of acute diverticulitis 10/22/2017 at which time he was treated in the outpatient setting.  Patient presented back with abnormal CT of abdomen and pelvis concerning for acute diverticulitis with diverticular abscess.Seen in consultation by general surgery who is recommending IV antibiotics and percutaneous drainage of diverticular abscess with cultures to be done. INR therapeutic and currently at 2.05 from 2.18 from 2.93 from 2.37.  Patient status post 2 units of FFP 11/28/2017, and 2 units of FFP 11/30/2017 in hopes of reversal of Coumadin. Transitioned to augmentin to complete 7 day course.  Pt s/p percutaneous drain placement  2.  Coronary artery disease/status post CABG x4 11/02/2017/ischemic cardiomyopathy/atrial fibrillation Currently stable.  Continue home regimen  of amiodarone 100 mg daily.  Once INR is subtherapeutic will place on baby aspirin 81 mg daily per cardiology recommendations.  Due to patient's recent CABG cardiology was consulted and followed the patient. CVTS has been notified of patient's admission. Remains stable at this time  3.  Chronic anticoagulation Coumadin was on hold currently on hold in anticipation of percutaneous drain.  INR has since been reversed with FFP and vit K. OK to resume coumadin per IR. Will resume per pharmacy dosing  4.  Anemia Hemoglobin currently stable at 9.8. Follow H&H.  DVT prophylaxis: coumadin Code Status: Full Family Communication: Pt in room, family not at bedside Disposition Plan: Possible discharge in 24-48hrs  Consultants:   Cardiology  IR  Procedures:   Percutaneous cholecystostomy tube placement 12/02/17  Antimicrobials: Anti-infectives (From admission, onward)   Start     Dose/Rate Route Frequency Ordered Stop   12/03/17 1030  amoxicillin-clavulanate (AUGMENTIN) 875-125 MG per tablet 1 tablet     1 tablet Oral Every 12 hours 12/03/17 1025     11/26/17 1630  piperacillin-tazobactam (ZOSYN) IVPB 3.375 g  Status:  Discontinued     3.375 g 12.5 mL/hr over 240 Minutes Intravenous Every 8 hours 11/26/17 1032 12/03/17 1025   11/26/17 0800  piperacillin-tazobactam (ZOSYN) IVPB 3.375 g     3.375 g 100 mL/hr over 30 Minutes Intravenous  Once 11/26/17 0755 11/26/17 0903      Subjective: Asking about going home  Objective: Vitals:   12/02/17 1829 12/02/17 2042 12/03/17 0524 12/03/17 1009  BP: 125/76 129/64 118/74 125/77  Pulse: (!) 58 (!) 57 65 60  Resp: 19 18 17 18   Temp: 98 F (36.7 C) 97.6 F (36.4 C) 98.6 F (37 C) 98.2 F (  36.8 C)  TempSrc: Oral Oral Oral Oral  SpO2: 99% 100% 100% 99%  Weight:  67.9 kg (149 lb 11.1 oz)    Height:        Intake/Output Summary (Last 24 hours) at 12/03/2017 1602 Last data filed at 12/03/2017 0900 Gross per 24 hour  Intake 1651.67 ml    Output 648 ml  Net 1003.67 ml   Filed Weights   11/30/17 2121 12/01/17 2019 12/02/17 2042  Weight: 67.8 kg (149 lb 7.6 oz) 67.6 kg (149 lb 0.5 oz) 67.9 kg (149 lb 11.1 oz)    Examination: General exam: Awake, laying in bed, in nad Respiratory system: Normal respiratory effort, no wheezing Cardiovascular system: regular rate, s1, s2 Gastrointestinal system: Soft, nondistended, positive BS Central nervous system: CN2-12 grossly intact, strength intact Extremities: Perfused, no clubbing Skin: Normal skin turgor, no notable skin lesions seen Psychiatry: Mood normal // no visual hallucinations  .   Data Reviewed: I have personally reviewed following labs and imaging studies  CBC: Recent Labs  Lab 11/29/17 0744 11/30/17 0837 12/01/17 0713 12/02/17 0626 12/03/17 0530  WBC 7.1 5.4 6.4 5.1 3.5*  HGB 12.1* 10.8* 9.8* 9.2* 9.3*  HCT 38.0* 33.6* 31.2* 28.5* 29.2*  MCV 88.6 87.7 87.6 88.0 88.0  PLT 407* 300 263 231 270   Basic Metabolic Panel: Recent Labs  Lab 11/27/17 0845  11/29/17 0744 11/30/17 0837 12/01/17 0713 12/02/17 0626 12/03/17 0530  NA  --    < > 135 135 135 134* 136  K  --    < > 3.7 3.6 3.9 3.3* 3.3*  CL  --    < > 99* 99* 99* 102 101  CO2  --    < > 23 25 24 22 24   GLUCOSE  --    < > 109* 97 94 88 95  BUN  --    < > 7 6 6 6 7   CREATININE  --    < > 1.32* 1.18 1.20 1.11 1.00  CALCIUM  --    < > 9.4 9.0 8.8* 8.6* 8.5*  MG 1.9  --   --   --   --   --   --    < > = values in this interval not displayed.   GFR: Estimated Creatinine Clearance: 67 mL/min (by C-G formula based on SCr of 1 mg/dL). Liver Function Tests: No results for input(s): AST, ALT, ALKPHOS, BILITOT, PROT, ALBUMIN in the last 168 hours. No results for input(s): LIPASE, AMYLASE in the last 168 hours. No results for input(s): AMMONIA in the last 168 hours. Coagulation Profile: Recent Labs  Lab 11/30/17 0837 12/01/17 0713 12/01/17 1520 12/02/17 0626 12/03/17 0530  INR 2.60 2.05 1.39  1.30 1.29   Cardiac Enzymes: No results for input(s): CKTOTAL, CKMB, CKMBINDEX, TROPONINI in the last 168 hours. BNP (last 3 results) No results for input(s): PROBNP in the last 8760 hours. HbA1C: No results for input(s): HGBA1C in the last 72 hours. CBG: No results for input(s): GLUCAP in the last 168 hours. Lipid Profile: No results for input(s): CHOL, HDL, LDLCALC, TRIG, CHOLHDL, LDLDIRECT in the last 72 hours. Thyroid Function Tests: No results for input(s): TSH, T4TOTAL, FREET4, T3FREE, THYROIDAB in the last 72 hours. Anemia Panel: No results for input(s): VITAMINB12, FOLATE, FERRITIN, TIBC, IRON, RETICCTPCT in the last 72 hours. Sepsis Labs: No results for input(s): PROCALCITON, LATICACIDVEN in the last 168 hours.  Recent Results (from the past 240 hour(s))  Aerobic/Anaerobic Culture (surgical/deep wound)  Status: None (Preliminary result)   Collection Time: 12/02/17  3:01 PM  Result Value Ref Range Status   Specimen Description ABSCESS  Final   Special Requests NONE  Final   Gram Stain   Final    ABUNDANT WBC PRESENT, PREDOMINANTLY PMN RARE GRAM VARIABLE ROD    Culture   Final    FEW GRAM NEGATIVE RODS IDENTIFICATION AND SUSCEPTIBILITIES TO FOLLOW    Report Status PENDING  Incomplete     Radiology Studies: Ct Image Guided Drainage By Percutaneous Catheter  Result Date: 12/02/2017 INDICATION: 71 year old male with a history of diverticulitis and abscess EXAM: CT GUIDED DRAINAGE OF PELVIC ABSCESS MEDICATIONS: The patient is currently admitted to the hospital and receiving intravenous antibiotics. The antibiotics were administered within an appropriate time frame prior to the initiation of the procedure. ANESTHESIA/SEDATION: 2.5 mg IV Versed 100 mcg IV Fentanyl Moderate Sedation Time:  10 minutes The patient was continuously monitored during the procedure by the interventional radiology nurse under my direct supervision. COMPLICATIONS: None TECHNIQUE: Informed written  consent was obtained from the patient after a thorough discussion of the procedural risks, benefits and alternatives. All questions were addressed. Maximal Sterile Barrier Technique was utilized including caps, mask, sterile gowns, sterile gloves, sterile drape, hand hygiene and skin antiseptic. A timeout was performed prior to the initiation of the procedure. PROCEDURE: The lower abdomen was prepped with chlorhexidine in a sterile fashion, and a sterile drape was applied covering the operative field. A sterile gown and sterile gloves were used for the procedure. Local anesthesia was provided with 1% Lidocaine. Scout CT of the pelvis was performed for planning purposes. The patient was then prepped and draped in the usual sterile fashion and 1% lidocaine was used for local anesthesia. Using CT guidance, trocar needle was advanced into the pelvic abscess just superior to the bladder dome. Once we confirmed the needle tip position, modified Seldinger technique was used to place a 10 French pigtail drain catheter. Approximately 20 cc of purulent material was aspirated. Catheter was formed, sutured in position, an attached to bulb suction. Patient tolerated the procedure well and remained hemodynamically stable throughout. No complications were encountered and no significant blood loss. FINDINGS: Small gas and fluid collection persists just above the urinary bladder, compatible with ongoing pelvic abscess. Pigtail catheter placed into the abscess drained approximately 20 cc of purulent material. IMPRESSION: Status post CT-guided drainage of pelvic abscess. Signed, Dulcy Fanny. Earleen Newport, DO [facility] Radiology Electronically Signed   By: Corrie Mckusick D.O.   On: 12/02/2017 15:16    Scheduled Meds: . amoxicillin-clavulanate  1 tablet Oral Q12H  . febuxostat  40 mg Oral Daily  . sodium chloride flush  5 mL Intravenous Q8H  . warfarin  2 mg Oral ONCE-1800  . Warfarin -  Pharmacist Dosing Inpatient   Does not apply q1800   Continuous Infusions: . sodium chloride 50 mL/hr at 12/03/17 0658     LOS: 7 days   Marylu Lund, MD [facility] Pager [phone]  If 7PM-7AM, please contact night-coverage [email or web address] Password [email or web address] 12/03/2017, 4:02 PM

## 2017-12-03 NOTE — Progress Notes (Signed)
  Subjective:  Feels fine. Had IR drain placed yesterday. Hopes to go home tomorrow.  Objective: Vital signs in last 24 hours: Temp:  [97.6 F (36.4 C)-98.6 F (37 C)] 98.2 F (36.8 C) (12/29 1009) Pulse Rate:  [57-65] 60 (12/29 1009) Cardiac Rhythm: Heart block;Bundle branch block (12/28 1900) Resp:  [17-19] 18 (12/29 1009) BP: (118-149)/(64-77) 125/77 (12/29 1009) SpO2:  [99 %-100 %] 99 % (12/29 1009) Weight:  [67.9 kg (149 lb 11.1 oz)] 67.9 kg (149 lb 11.1 oz) (12/28 2042)  Hemodynamic parameters for last 24 hours:    Intake/Output from previous day: 12/28 0701 - 12/29 0700 In: 1411.7 [P.O.:480; I.V.:781.7; IV Piggyback:150] Out: 823 [Urine:800; Drains:23] Intake/Output this shift: Total I/O In: 240 [P.O.:240] Out: -   General appearance: alert and cooperative Heart: regular rate and rhythm, S1, S2 normal, no murmur, click, rub or gallop Lungs: clear to auscultation bilaterally Incisions well-healed, sternum stable.  Lab Results: Recent Labs    12/02/17 0626 12/03/17 0530  WBC 5.1 3.5*  HGB 9.2* 9.3*  HCT 28.5* 29.2*  PLT 231 263   BMET:  Recent Labs    12/02/17 0626 12/03/17 0530  NA 134* 136  K 3.3* 3.3*  CL 102 101  CO2 22 24  GLUCOSE 88 95  BUN 6 7  CREATININE 1.11 1.00  CALCIUM 8.6* 8.5*    PT/INR:  Recent Labs    12/03/17 0530  LABPROT 16.0*  INR 1.29   ABG    Component Value Date/Time   PHART 7.414 10/26/2017 2330   HCO3 23.3 10/26/2017 2330   TCO2 23 10/27/2017 1622   ACIDBASEDEF 1.0 10/26/2017 2330   O2SAT 99.0 10/26/2017 2330   CBG (last 3)  No results for input(s): GLUCAP in the last 72 hours.  Assessment/Plan:  He is doing well from a cardiovascular standpoint. He is about six weeks out from CABG and maintaining sinus rhythm. I will stop the amiodarone now. He says it is altering his taste.  LOS: 7 days    Gaye Pollack 12/03/2017

## 2017-12-04 LAB — BASIC METABOLIC PANEL
Anion gap: 8 (ref 5–15)
BUN: 6 mg/dL (ref 6–20)
CHLORIDE: 105 mmol/L (ref 101–111)
CO2: 23 mmol/L (ref 22–32)
CREATININE: 0.97 mg/dL (ref 0.61–1.24)
Calcium: 8.7 mg/dL — ABNORMAL LOW (ref 8.9–10.3)
GFR calc Af Amer: >60 mL/min (ref >60)
GFR calc non Af Amer: >60 mL/min (ref >60)
GLUCOSE: 86 mg/dL (ref 65–99)
POTASSIUM: 3.9 mmol/L (ref 3.5–5.1)
SODIUM: 136 mmol/L (ref 135–145)

## 2017-12-04 LAB — CBC
HEMATOCRIT: 35.9 % — AB (ref 39.0–52.0)
Hemoglobin: 11.3 g/dL — ABNORMAL LOW (ref 13.0–17.0)
MCH: 28.1 pg (ref 26.0–34.0)
MCHC: 31.5 g/dL (ref 30.0–36.0)
MCV: 89.3 fL (ref 78.0–100.0)
PLATELETS: 282 10*3/uL (ref 150–400)
RBC: 4.02 MIL/uL — ABNORMAL LOW (ref 4.22–5.81)
RDW: 16.6 % — AB (ref 11.5–15.5)
WBC: 5.7 10*3/uL (ref 4.0–10.5)

## 2017-12-04 LAB — PROTIME-INR
INR: 1.44
Prothrombin Time: 17.5 seconds — ABNORMAL HIGH (ref 11.4–15.2)

## 2017-12-04 MED ORDER — WARFARIN SODIUM 4 MG PO TABS
4.0000 mg | ORAL_TABLET | Freq: Once | ORAL | Status: DC
  Filled 2017-12-04: qty 1

## 2017-12-04 MED ORDER — AMOXICILLIN-POT CLAVULANATE 875-125 MG PO TABS: 1.0000 | ORAL_TABLET | Freq: Two times a day (BID) | ORAL | 0 refills | Status: DC

## 2017-12-04 NOTE — Discharge Summary (Signed)
Physician Discharge Summary  Dean [name] ZSW:[id number] DOB: [birth year] DOA: 11/26/2017  PCP: [name], PA-C  Admit date: 11/26/2017 Discharge date: 12/04/2017  Admitted From: Home Disposition:  Home  Recommendations for Outpatient Follow-up:  1. Follow up with PCP in 2-3 weeks 2. Follow up with General Surgery as scheduled  Discharge Condition:Stable CODE STATUS:Full Diet recommendation: Heart Healthy   Brief/Interim Summary: 71 year old male with history of recurrent diverticulitis presenting with abdominal pain, diverticular abscess, persistent diverticulitis. Patient with recent STEMI and CABG. Coumadin on hold for atrial fibrillation that has been managed as outpatient for sigmoid diverticulitis. Continue empiric IV antibiotics. General surgery following. Pt is now s/p percutaneous drain placement  1. acute diverticulitis with diverticular abscess Patient with multiple episodes of acute diverticulitis. October 07, 2017 patient had CT scan that showed focally perforated sigmoid diverticulitis at which time patient was admitted followed by general surgery and discharged on oral ciprofloxacin and Flagyl. Patient also noted to have another bout of acute diverticulitis 10/22/2017 at which time he was treated in the outpatient setting. Patient presented back with abnormal CT of abdomen and pelvis concerning for acute diverticulitis with diverticular abscess.Seen in consultation by general surgery who is recommending IV antibiotics and percutaneous drainage of diverticular abscess with cultures to be done. INR therapeutic and currently at 2.05 from2.18 from 2.93 from 2.37. Patient status post 2 units of FFP 11/28/2017, and 2 units of FFP 12/26/2018in hopes of reversal of Coumadin. Transitioned to augmentin to complete 7 day course.Pt s/p percutaneous drain placement per IR  2. 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 Continue home regimen of amiodarone 100 mg daily. Once INR is subtherapeutic will place on baby aspirin 81 mg daily per cardiology recommendations. Due to patient's recent CABG cardiology was consulted and followed the patient. CVTS has been notified of patient's admission. Patient has since remained stable. Amiodarone was discontinued by CTS  3. Chronic anticoagulation Coumadin was on hold currently on hold in anticipation of percutaneous drain. INR has since been reversed with FFP and vit K. OK to resume coumadin per IR. Have resumed per pharmacy dosing  4. Anemia Remained hemodynamically stable   Discharge Diagnoses:  Principal Problem:   Diverticulitis Active Problems:   Iron deficiency anemia   S/P CABG x 4   Atrial fibrillation (HCC) [I48.91]   Colonic diverticular abscess   Abscess of abdominal cavity (HCC)   Acute diverticulitis   SOB (shortness of breath)    Discharge Instructions   Allergies as of 12/04/2017      Reactions   Aspirin Other (See Comments)   Will flare up gout Will flare up gout   Crestor [rosuvastatin Calcium]    Reported intolerance to Lipitor in the past Crestor 40mg  patient reported muscle aches, dose reduced to rechallenge      Medication List    STOP taking these medications   amiodarone 200 MG tablet Commonly known as:  PACERONE     TAKE these medications   acetaminophen 325 MG tablet Commonly known as:  TYLENOL Take 325 mg every 6 (six) hours as needed by mouth for mild pain.   amoxicillin-clavulanate 875-125 MG tablet Commonly known as:  AUGMENTIN Take 1 tablet by mouth every 12 (twelve) hours. What changed:  when to take this   aspirin 81 MG EC tablet Take 1 tablet (81 mg total) by mouth daily.   colchicine 0.6 MG tablet Take 0.6 mg by mouth daily as needed (gout).   febuxostat 40 MG tablet  Commonly known as:  ULORIC Take 40 mg by mouth daily.   hydrocortisone cream 1 % Apply 1  application topically as needed for itching. Do NOT apply to sternal wound   Vitamin D3 5000 units Caps Take 5,000 Units daily by mouth.   warfarin 1 MG tablet Commonly known as:  COUMADIN Take 1 tablet (1 mg total) by mouth daily. Or as directed. What changed:  additional instructions      Follow-up Information    [name], PA-C. Schedule an appointment as soon as possible for a visit in 2 week(s).   Specialty:  Physician Assistant Contact information: [address] [place] [address] [phone]          Allergies  Allergen Reactions  . Aspirin Other (See Comments)    Will flare up gout Will flare up gout  . Crestor [Rosuvastatin Calcium]     Reported intolerance to Lipitor in the past Crestor 40mg  patient reported muscle aches, dose reduced to rechallenge    Consultations:  IR   Cardiology  Procedures/Studies: Ct Abdomen Pelvis W Contrast  Result Date: 11/25/2017 CLINICAL DATA:  Recent diverticulitis.  Abdominal pain persists. EXAM: CT ABDOMEN AND PELVIS WITH CONTRAST TECHNIQUE: Multidetector CT imaging of the abdomen and pelvis was performed using the standard protocol following bolus administration of intravenous contrast. CONTRAST:  162mL ISOVUE-300 IOPAMIDOL (ISOVUE-300) INJECTION 61% COMPARISON:  10/21/2017 FINDINGS: Lower chest: No acute abnormality. Hepatobiliary: Tiny hypodense lesion within the right liver lobe is too small to characterize, stable compared to the previous study and likely reflects a benign cyst. Diffuse low attenuation of the liver as can be seen with hepatic steatosis. No suspicious mass or lesion within the liver. Gallbladder appears normal. No bile duct dilatation. Pancreas: Unremarkable. No pancreatic ductal dilatation or surrounding inflammatory changes. Spleen: Normal in size without focal abnormality. Adrenals/Urinary Tract: Adrenal glands are unremarkable. Kidneys are normal, without renal calculi, focal lesion, or  hydronephrosis. Bladder is unremarkable. Stomach/Bowel: Stomach is within normal limits. Large amount stool throughout the colon. Sigmoid colon diverticulosis with severe bowel wall thickening and perisigmoid ule inflammatory changes. Peridiverticular complex fluid collection with air within the collection and peripheral enhancement measuring 3.8 x 3.5 x 3.9 cm abutting the dome of the bladder. 1.8 cm primarily air-filled abscess or contained perforation along the superior margin of the sigmoid colon with surrounding inflammatory changes. Vascular/Lymphatic: Abdominal aortic atherosclerosis. Infrarenal abdominal aortic ectasia measuring 2.9 x 2.7 cm. 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 Reproductive: Prostate is unremarkable. Other: No abdominal wall hernia or abnormality. No abdominopelvic ascites. Musculoskeletal: No acute osseous abnormality. No aggressive osseous lesion. IMPRESSION: 1. Persistent diverticulitis of the sigmoid colon. Peridiverticular abscess measuring 3.8 x 3.5 x 3.9 cm abutting the dome of the bladder which has enlarged compared with the prior examination. 2. 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 Recommend followup by ultrasound in 5 years. This recommendation follows ACR consensus guidelines: White Paper of the ACR Incidental Findings Committee II on Vascular Findings. J Am Coll Radiol 2013; 10:789-794. Electronically Signed   By: Kathreen Devoid   On: 11/25/2017 15:19   Portable Chest 1 View  Result Date: 11/26/2017 CLINICAL DATA:  Shortness of Breath EXAM: PORTABLE CHEST 1 VIEW COMPARISON:  10/30/2017 FINDINGS: Prior CABG. Mild cardiomegaly. Lungs clear. No effusions or pneumothorax. No acute bony abnormality. IMPRESSION: No active disease. Electronically Signed   By: Rolm Baptise M.D.  On: 11/26/2017 10:08   Ct Image Guided Drainage By Percutaneous Catheter  Result Date:  12/02/2017 INDICATION: 71 year old male with a history of diverticulitis and abscess EXAM: CT GUIDED DRAINAGE OF PELVIC ABSCESS MEDICATIONS: The patient is currently admitted to the hospital and receiving intravenous antibiotics. The antibiotics were administered within an appropriate time frame prior to the initiation of the procedure. ANESTHESIA/SEDATION: 2.5 mg IV Versed 100 mcg IV Fentanyl Moderate Sedation Time:  10 minutes The patient was continuously monitored during the procedure by the interventional radiology nurse under my direct supervision. COMPLICATIONS: None TECHNIQUE: Informed written consent was obtained from the patient after a thorough discussion of the procedural risks, benefits and alternatives. All questions were addressed. Maximal Sterile Barrier Technique was utilized including caps, mask, sterile gowns, sterile gloves, sterile drape, hand hygiene and skin antiseptic. A timeout was performed prior to the initiation of the procedure. PROCEDURE: The lower abdomen was prepped with chlorhexidine in a sterile fashion, and a sterile drape was applied covering the operative field. A sterile gown and sterile gloves were used for the procedure. Local anesthesia was provided with 1% Lidocaine. Scout CT of the pelvis was performed for planning purposes. The patient was then prepped and draped in the usual sterile fashion and 1% lidocaine was used for local anesthesia. Using CT guidance, trocar needle was advanced into the pelvic abscess just superior to the bladder dome. Once we confirmed the needle tip position, modified Seldinger technique was used to place a 10 French pigtail drain catheter. Approximately 20 cc of purulent material was aspirated. Catheter was formed, sutured in position, an attached to bulb suction. Patient tolerated the procedure well and remained hemodynamically stable throughout. No complications were encountered and no significant blood loss. FINDINGS: Small gas and fluid  collection persists just above the urinary bladder, compatible with ongoing pelvic abscess. Pigtail catheter placed into the abscess drained approximately 20 cc of purulent material. IMPRESSION: Status post CT-guided drainage of pelvic abscess. Signed, Dulcy Fanny. Earleen Newport, DO [facility] Electronically Signed   By: Corrie Mckusick D.O.   On: 12/02/2017 15:16    Subjective: Eager to go home  Discharge Exam: Vitals:   12/04/17 0528 12/04/17 0837  BP: 140/85 135/64  Pulse: (!) 55 71  Resp: 18 18  Temp: 97.9 F (36.6 C)   SpO2: 98% 100%   Vitals:   12/03/17 1009 12/03/17 2039 12/04/17 0528 12/04/17 0837  BP: 125/77 (!) 146/84 140/85 135/64  Pulse: 60 63 (!) 55 71  Resp: 18 20 18 18   Temp: 98.2 F (36.8 C) 97.9 F (36.6 C) 97.9 F (36.6 C)   TempSrc: Oral Oral Oral Oral  SpO2: 99% 100% 98% 100%  Weight:  69.4 kg (152 lb 14.4 oz)    Height:        General: Pt is alert, awake, not in acute distress Cardiovascular: RRR, S1/S2 +, no rubs, no gallops Respiratory: CTA bilaterally, no wheezing, no rhonchi Abdominal: Soft, NT, ND, bowel sounds + Extremities: no edema, no cyanosis   The results of significant diagnostics from this hospitalization (including imaging, microbiology, ancillary and laboratory) are listed below for reference.     Microbiology: Recent Results (from the past 240 hour(s))  Aerobic/Anaerobic Culture (surgical/deep wound)     Status: None (Preliminary result)   Collection Time: 12/02/17  3:01 PM  Result Value Ref Range Status   Specimen Description ABSCESS  Final   Special Requests NONE  Final   Gram Stain  Final    ABUNDANT WBC PRESENT, PREDOMINANTLY PMN RARE GRAM VARIABLE ROD    Culture   Final    FEW ENTEROBACTER CLOACAE SUSCEPTIBILITIES TO FOLLOW    Report Status PENDING  Incomplete     Labs: BNP (last 3 results) No results for input(s): BNP in the last 8760 hours. Basic Metabolic  Panel: Recent Labs  Lab 11/30/17 0837 12/01/17 0713 12/02/17 0626 12/03/17 0530 12/04/17 0505  NA 135 135 134* 136 136  K 3.6 3.9 3.3* 3.3* 3.9  CL 99* 99* 102 101 105  CO2 25 24 22 24 23   GLUCOSE 97 94 88 95 86  BUN 6 6 6 7 6   CREATININE 1.18 1.20 1.11 1.00 0.97  CALCIUM 9.0 8.8* 8.6* 8.5* 8.7*   Liver Function Tests: No results for input(s): AST, ALT, ALKPHOS, BILITOT, PROT, ALBUMIN in the last 168 hours. No results for input(s): LIPASE, AMYLASE in the last 168 hours. No results for input(s): AMMONIA in the last 168 hours. CBC: Recent Labs  Lab 11/30/17 0837 12/01/17 0713 12/02/17 0626 12/03/17 0530 12/04/17 1038  WBC 5.4 6.4 5.1 3.5* 5.7  HGB 10.8* 9.8* 9.2* 9.3* 11.3*  HCT 33.6* 31.2* 28.5* 29.2* 35.9*  MCV 87.7 87.6 88.0 88.0 89.3  PLT 300 263 231 263 282   Cardiac Enzymes: No results for input(s): CKTOTAL, CKMB, CKMBINDEX, TROPONINI in the last 168 hours. BNP: Invalid input(s): POCBNP CBG: No results for input(s): GLUCAP in the last 168 hours. D-Dimer No results for input(s): DDIMER in the last 72 hours. Hgb A1c No results for input(s): HGBA1C in the last 72 hours. Lipid Profile No results for input(s): CHOL, HDL, LDLCALC, TRIG, CHOLHDL, LDLDIRECT in the last 72 hours. Thyroid function studies No results for input(s): TSH, T4TOTAL, T3FREE, THYROIDAB in the last 72 hours.  Invalid input(s): FREET3 Anemia work up No results for input(s): VITAMINB12, FOLATE, FERRITIN, TIBC, IRON, RETICCTPCT in the last 72 hours. Urinalysis    Component Value Date/Time   COLORURINE YELLOW 11/26/2017 0812   APPEARANCEUR HAZY (A) 11/26/2017 0812   LABSPEC 1.017 11/26/2017 0812   PHURINE 5.0 11/26/2017 0812   GLUCOSEU NEGATIVE 11/26/2017 0812   HGBUR SMALL (A) 11/26/2017 0812   BILIRUBINUR NEGATIVE 11/26/2017 0812   KETONESUR NEGATIVE 11/26/2017 0812   PROTEINUR NEGATIVE 11/26/2017 0812   NITRITE NEGATIVE 11/26/2017 0812   LEUKOCYTESUR NEGATIVE 11/26/2017 4481    Sepsis Labs Invalid input(s): PROCALCITONIN,  WBC,  LACTICIDVEN Microbiology Recent Results (from the past 240 hour(s))  Aerobic/Anaerobic Culture (surgical/deep wound)     Status: None (Preliminary result)   Collection Time: 12/02/17  3:01 PM  Result Value Ref Range Status   Specimen Description ABSCESS  Final   Special Requests NONE  Final   Gram Stain   Final    ABUNDANT WBC PRESENT, PREDOMINANTLY PMN RARE GRAM VARIABLE ROD    Culture   Final    FEW ENTEROBACTER CLOACAE SUSCEPTIBILITIES TO FOLLOW    Report Status PENDING  Incomplete     SIGNED:   Marylu Lund, MD  [facility] 12/04/2017, 1:12 PM  If 7PM-7AM, please contact night-coverage [email or web address] Password [email or web address]

## 2017-12-04 NOTE — Progress Notes (Signed)
[name] for warfarin Indication: atrial fibrillation  Allergies  Allergen Reactions  . Aspirin Other (See Comments)    Will flare up gout Will flare up gout  . Crestor [Rosuvastatin Calcium]     Reported intolerance to Lipitor in the past Crestor 40mg  patient reported muscle aches, dose reduced to rechallenge    Labs: Recent Labs    12/02/17 0626 12/03/17 0530 12/04/17 0505  HGB 9.2* 9.3*  --   HCT 28.5* 29.2*  --   PLT 231 263  --   LABPROT 16.1* 16.0* 17.5*  INR 1.30 1.29 1.44  CREATININE 1.11 1.00 0.97    Estimated Creatinine Clearance: 70.6 mL/min (by C-G formula based on SCr of 0.97 mg/dL).    Assessment: 71 y/o male admitted 11/26/2017 with abnormal CT showing large amount of diverticular abscesses with persistent diverticulitis. In 10/2017 patient had CABG and developed post-op Afib. He was started on warfarin. Pharmacy consulted to resume warfarin. He is now s/p percutaneous drain placement.  INR up to 1.44 Amiodarone stopped - > may require higher doses of warfarin now CBC stable  PTA regimen: 1 mg daily except 2 mg MWF  Goal of Therapy:  INR 2-3 Monitor platelets by anticoagulation protocol: Yes   Plan:  Warfarin 4 mg PO tonight (off schedule trying to overcome vitamin K) Daily INR Monitor for s/sx of bleeding  Thank you Anette Guarneri, PharmD [phone] 12/04/2017 11:19 AM

## 2017-12-04 NOTE — Progress Notes (Signed)
Education rendered to patient how to changed cleaned dressing on his jp site.How to flush with normal saline twice a day and to assess the signs and symptoms of infection.Advised patient to report to his M.D. In case of any signs and symptoms of infection.Eight days supplies of dressing change given to patient.Questions answered satisfactorily at the time of discharged.Patient reminded of his pending medical appointment. Discharged papers explained and given to the patient.

## 2017-12-07 LAB — AEROBIC/ANAEROBIC CULTURE (SURGICAL/DEEP WOUND)

## 2017-12-07 LAB — AEROBIC/ANAEROBIC CULTURE W GRAM STAIN (SURGICAL/DEEP WOUND)

## 2017-12-08 ENCOUNTER — Ambulatory Visit: Payer: Medicare Other | Admitting: Physician Assistant

## 2017-12-08 ENCOUNTER — Encounter (HOSPITAL_COMMUNITY): Payer: Self-pay | Admitting: Surgery

## 2017-12-08 NOTE — OR Nursing (Signed)
Late entry due to mistype of case start time.

## 2017-12-09 NOTE — Progress Notes (Signed)
Cardiology Office Note    Date:  12/12/2017   ID:  Dean [name], DOB 1946-01-02, MRN [id number]  PCP:  [name], PA-C  Cardiologist:  Dr. Acie Fredrickson  Chief Complaint: Hospital follow up   History of Present Illness:   Dean [name] is a 72 y.o. male  with a hx of coronary artery disease s/p CABG, paroxysmal atrial fibrillation, ischemic cardiomyopathy and HLD presents for follow up.   Hx of known coronary artery disease with CABG x4 on 11/02/17 by Dr. Cyndia Bent had post op atrial fibrillation. Converted to NSR on IV amiodarone.   Admitted 11/26/17-12/04/17 for acute diverticulitis with abscess s/p percutaneous drain placement per IR. INR has since been reversed with FFP and vit K for IR. Resumed coumadin at discharge. Resting bradycardia at night. Avoid BB. Amiodarone discontinued by CTS.   Here today for follow up. He is doing well.  Patient denies any chest pain.  He has some exertional shortness of breath during lifting heavy stuff.  He denies lower extremity edema.  He still has stitches at vein harvest site with mild edema.  He is going to remove his drain tomorrow.  He denies orthopnea, PND, syncope, lower extremity edema, dizziness or palpitation.   Past Medical History:  Diagnosis Date  . Acid reflux   . Arthropathy   . Atrial fibrillation ([place]) 10/2017  . Coronary artery disease   . Diverticulitis   . Dizziness   . Fatigue   . Gout   . Hepatitis A age 38  . Hyperlipemia   . Hypogonadism male   . Knee pain   . Night sweats   . Non compliance with medical treatment   . Non-STEMI (non-ST elevated myocardial infarction) ([place]) 10/2017  . Palpitations   . Seasonal allergies   . SOB (shortness of breath)   . Vitamin D deficiency     Past Surgical History:  Procedure Laterality Date  . COLONOSCOPY  ~ 2014   Dr Ferdinand Lango in [facility].  Diverticulosis.  pt denies colon polyps.    . CORONARY ARTERY BYPASS GRAFT N/A 10/26/2017   Procedure: CORONARY ARTERY BYPASS  GRAFTING (CABG) x four , using left internal mammary artery and bilateral legs greater saphenous vein harvested endoscopically;  Surgeon: Gaye Pollack, MD;  Location: [facility];  Service: Open Heart Surgery;  Laterality: N/A;  . LEFT HEART CATH AND CORONARY ANGIOGRAPHY N/A 10/25/2017   Procedure: LEFT HEART CATH AND CORONARY ANGIOGRAPHY;  Surgeon: Leonie Man, MD;  Location: [facility];  Service: Cardiovascular;  Laterality: N/A;  . TEE WITHOUT CARDIOVERSION N/A 10/26/2017   Procedure: TRANSESOPHAGEAL ECHOCARDIOGRAM (TEE);  Surgeon: Gaye Pollack, MD;  Location: [facility];  Service: Open Heart Surgery;  Laterality: N/A;    Current Medications: Prior to Admission medications   Medication Sig Start Date End Date Taking? Authorizing Provider  acetaminophen (TYLENOL) 325 MG tablet Take 325 mg every 6 (six) hours as needed by mouth for mild pain.    [provider]  amoxicillin-clavulanate (AUGMENTIN) 875-125 MG tablet Take 1 tablet by mouth every 12 (twelve) hours. 12/04/17   Donne Hazel, MD  aspirin EC 81 MG EC tablet Take 1 tablet (81 mg total) by mouth daily. 11/06/17   Nani Skillern, PA-C  Cholecalciferol (VITAMIN D3) 5000 units CAPS Take 5,000 Units daily by mouth.     [provider]  colchicine 0.6 MG tablet Take 0.6 mg by mouth daily as needed (gout).     [provider]  febuxostat (ULORIC) 40 MG tablet Take 40 mg by mouth daily.    [provider]  hydrocortisone cream 1 % Apply 1 application topically as needed for itching. Do NOT apply to sternal wound 11/06/17   Lars Pinks M, PA-C  warfarin (COUMADIN) 1 MG tablet Take 1 tablet (1 mg total) by mouth daily. Or as directed. Patient taking differently: Take 1 mg by mouth daily. 2 mg on Monday, Wednesday and Friday. 1 mg all other days 11/06/17   Nani Skillern, PA-C    Allergies:   Aspirin and Crestor [rosuvastatin calcium]   Social History   Socioeconomic History    . Marital status: Single    Spouse name: None  . Number of children: None  . Years of education: None  . Highest education level: None  Social Needs  . Financial resource strain: None  . Food insecurity - worry: None  . Food insecurity - inability: None  . Transportation needs - medical: None  . Transportation needs - non-medical: None  Occupational History  . Occupation: retired  Tobacco Use  . Smoking status: Former Research scientist (life sciences)  . Smokeless tobacco: Never Used  Substance and Sexual Activity  . Alcohol use: Yes    Alcohol/week: 0.0 oz    Comment: once monthly  . Drug use: No  . Sexual activity: None  Other Topics Concern  . None  Social History Narrative  . None     Family History:  The patient's family history includes Alzheimer's disease in his maternal grandmother and mother; Hyperlipidemia in his brother and mother.   ROS:   Please see the history of present illness.    ROS All other systems reviewed and are negative.   PHYSICAL EXAM:   VS:  BP 130/82   Pulse 74   Ht 5' 9.5" (1.765 m)   Wt 152 lb (68.9 kg)   BMI 22.12 kg/m    GEN: Well nourished, well developed, in no acute distress  HEENT: normal  Neck: no JVD, carotid bruits, or masses Cardiac: RRR; no murmurs, rubs, or gallops,no edema  Respiratory:  clear to auscultation bilaterally, normal work of breathing GI: soft, nontender, nondistended, + BS, IR drain MS: no deformity or atrophy  Skin: warm and dry, no rash, Stitches at vein harvest site bilateral legs Neuro:  Alert and Oriented x 3, Strength and sensation are intact Psych: euthymic mood, full affect  Wt Readings from Last 3 Encounters:  12/12/17 152 lb (68.9 kg)  12/03/17 152 lb 14.4 oz (69.4 kg)  11/22/17 148 lb (67.1 kg)      Studies/Labs Reviewed:   EKG:  EKG is not ordered today.    Recent Labs: 10/25/2017: TSH 5.811 11/26/2017: ALT 11 11/27/2017: Magnesium 1.9 12/04/2017: BUN 6; Creatinine, Ser 0.97; Hemoglobin 11.3; Platelets 282;  Potassium 3.9; Sodium 136   Lipid Panel    Component Value Date/Time   CHOL 128 10/25/2017 0341   TRIG 104 10/25/2017 0341   HDL 39 (L) 10/25/2017 0341   CHOLHDL 3.3 10/25/2017 0341   VLDL 21 10/25/2017 0341   LDLCALC 68 10/25/2017 0341    Additional studies/ records that were reviewed today include:   Echocardiogram: 10/25/17 Study Conclusions  - Left ventricle: The cavity size was mildly dilated. Systolic   function was moderately to severely reduced. The estimated   ejection fraction was in the range of 30% to 35%. Severe   hypokinesis of the anterolateral, inferolateral, and inferior   myocardium; consistent with ischemia in  the distribution of the   right coronary and left circumflex coronary artery. Features are   consistent with a pseudonormal left ventricular filling pattern,   with concomitant abnormal relaxation and increased filling   pressure (grade 2 diastolic dysfunction). - Aortic valve: There was mild regurgitation. - Mitral valve: There was mild to moderate regurgitation directed   centrally. The acceleration rate of the regurgitant jet was   reduced, consistent with a low dP/dt. - Left atrium: The atrium was mildly to moderately dilated.  Impressions:  - Regional wall motion abnormalities and decreased LV systolic   function are new since August 2018.  Cardiac Catheterization: 10/25/17 LEFT HEART CATH AND CORONARY ANGIOGRAPHY  Conclusion     Dist LM to Ost LAD lesion is 80% stenosed with 80% stenosed side branch in Ost Cx.  Prox Cx lesion is 99% stenosed -after the ostial lesion. There is TIMI I flow distal.  Ost LAD to Prox LAD lesion is 60% stenosed. Prox LAD lesion is 80% stenosed. Prox LAD to Mid LAD lesion is 70% stenosed.  Prox RCA to Mid RCA lesion is 100% stenosed. The 2 major downstream branches of the RCA (RPL & RPDA) fill via collaterals with brisk filling.  There is severe left ventricular systolic dysfunction. The left ventricular  ejection fraction is less than 25% by visual estimate.  LV end diastolic pressure is moderately elevated.   Severe multivessel CAD with 80% distal left main into circumflex, 80% mid LAD, subtotal 99% proximal circumflex and 100% occluded RCA.   The RCA fills via collaterals from the LAD.  All distal vessels appeared to have good targets for bypass. 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  The patient is hemodynamically stable and not actively having anginal symptoms.  We have called CT surgical consultation for urgent CABG.  Unfortunately he is on Plavix.  If this will delay surgery, may need to consider mechanical support with possible balloon pump versus even Impella if symptoms were to worsen.  For now he will transfer to the CCU.  We will restart IV heparin 6 hours after TR band removal. We will keep him n.p.o. for now pending surgical consultation.  Recommend high-dose statin.  If blood pressure tolerates, would use beta-blocker    ASSESSMENT & PLAN:    1. CAD s/p CABG - no chest pain. His shortness of breath is likely due to deconditioning. He will enroll in cardiac rehab after all GI issue has been resolved. Continue ASA.   2. Paroxysmal atrial fibrillation -  Not on BB due to resting bradycardia at night. Continue coumadin (managed by PCP). Hopefully able to DC during follow up visit.   3. HLD - 10/25/2017: Cholesterol 128; HDL 39; LDL Cholesterol 68; Triglycerides 104; VLDL 21  - Statin intolerance. LDL at goal.   4. ICM - LVEF of 30-35% with grade 2 DD. No sign of heart failure.  - prior bradycardia at night. His HR is stable at 44s. Will start low dose BB. He will let u know when start taking it (will do after 1-2 weeks once all GI issue is resolved). At that time consider adding ACE/ARB. Echo during next visit to reevaluated LVEF.     Medication Adjustments/Labs and Tests Ordered: Current medicines are reviewed at length with the  patient today.  Concerns regarding medicines are outlined above.  Medication changes, Labs and Tests ordered today are listed in the Patient Instructions below. Patient Instructions  Medication Instructions:  If you need a refill on your cardiac medications before your next appointment, please call your pharmacy.  Labwork:   Testing/Procedures:   Follow-Up:   Any Other Special Instructions Will Be Listed Below (If Applicable).                                                                                                                                                      Jarrett Soho, [place]  12/12/2017 9:45 AM    [address], [place], [place]  [address] Phone: [phone]; Fax: [phone]

## 2017-12-12 ENCOUNTER — Ambulatory Visit: Payer: Medicare Other | Admitting: Physician Assistant

## 2017-12-12 ENCOUNTER — Ambulatory Visit (INDEPENDENT_AMBULATORY_CARE_PROVIDER_SITE_OTHER): Payer: Self-pay

## 2017-12-12 ENCOUNTER — Encounter: Payer: Self-pay | Admitting: Physician Assistant

## 2017-12-12 VITALS — BP 130/82 | HR 74 | Ht 69.5 in | Wt 152.0 lb

## 2017-12-12 DIAGNOSIS — I255 Ischemic cardiomyopathy: Secondary | ICD-10-CM

## 2017-12-12 DIAGNOSIS — I480 Paroxysmal atrial fibrillation: Secondary | ICD-10-CM | POA: Diagnosis not present

## 2017-12-12 DIAGNOSIS — I25700 Atherosclerosis of coronary artery bypass graft(s), unspecified, with unstable angina pectoris: Secondary | ICD-10-CM | POA: Diagnosis not present

## 2017-12-12 DIAGNOSIS — Z4802 Encounter for removal of sutures: Secondary | ICD-10-CM

## 2017-12-12 DIAGNOSIS — E785 Hyperlipidemia, unspecified: Secondary | ICD-10-CM | POA: Diagnosis not present

## 2017-12-12 MED ORDER — CARVEDILOL 3.125 MG PO TABS: 3.1250 mg | ORAL_TABLET | Freq: Two times a day (BID) | ORAL | 3 refills | Status: DC

## 2017-12-12 NOTE — Progress Notes (Signed)
Patient stopped by the office today for a suture removal. After evaluating the incision sties, they all looked normal and healing well. No sutures were seen for removal. He did have 2 internal sutures at he EVH site, that were noted. He was instructed to keep the sites clean with soap and water, no signs of infection.The surgeon will recheck at scheduled appt on 12/19/2017.

## 2017-12-12 NOTE — Patient Instructions (Addendum)
Medication Instructions:   START TAKING COREG 3.125 MG TWICE  DAY   If you need a refill on your cardiac medications before your next appointment, please call your pharmacy.  Labwork: NONE ORDERED  TODAY    Testing/Procedures: NONE ORDERED  TODAY    Follow-Up: IN 3 MONTHS WITH DR Acie Fredrickson     Any Other Special Instructions Will Be Listed Below (If Applicable).  PLEASE FOLLOW DIRECTIONS TO TRIAD CARDIAC AND THORACIC SURGERY

## 2017-12-14 ENCOUNTER — Other Ambulatory Visit: Payer: Self-pay | Admitting: Surgery

## 2017-12-14 DIAGNOSIS — K651 Peritoneal abscess: Secondary | ICD-10-CM

## 2017-12-19 ENCOUNTER — Other Ambulatory Visit: Payer: Self-pay

## 2017-12-19 ENCOUNTER — Other Ambulatory Visit: Payer: Self-pay | Admitting: Surgery

## 2017-12-19 ENCOUNTER — Ambulatory Visit
Admission: RE | Admit: 2017-12-19 | Discharge: 2017-12-19 | Disposition: A | Discharge status: Home / Self Care | Payer: Medicare Other | Source: Ambulatory Visit | Attending: Surgery | Admitting: Surgery

## 2017-12-19 ENCOUNTER — Ambulatory Visit (INDEPENDENT_AMBULATORY_CARE_PROVIDER_SITE_OTHER): Payer: Self-pay | Admitting: Physician Assistant

## 2017-12-19 VITALS — BP 122/58 | HR 33 | Resp 18 | Ht 69.5 in | Wt 152.8 lb

## 2017-12-19 DIAGNOSIS — Z951 Presence of aortocoronary bypass graft: Secondary | ICD-10-CM

## 2017-12-19 MED ORDER — WARFARIN SODIUM 1 MG PO TABS: 1.0000 mg | ORAL_TABLET | Freq: Every day | ORAL | 0 refills | Status: DC

## 2017-12-19 MED FILL — MONOJECT 0.9% NA CL SYRING: 0.9 | 12 days supply | Qty: 120 | Fill #0

## 2017-12-19 NOTE — Patient Instructions (Addendum)
Make every effort to keep your diabetes under very tight control.  Follow up closely with your primary care physician or endocrinologist and strive to keep their hemoglobin A1c levels as low as possible, preferably near or below 6.0.  The long term benefits of strict control of diabetes are far reaching and critically important for your overall health and survival.  You may return to driving an automobile as long as you are no longer requiring oral narcotic pain relievers during the daytime.  It would be wise to start driving only short distances during the daylight and gradually increase from there as you feel comfortable.  Make every effort to stay physically active, get some type of exercise on a regular basis, and stick to a "heart healthy diet".  The long term benefits for regular exercise and a healthy diet are critically important to your overall health and wellbeing.  You are encouraged to enroll and participate in the outpatient cardiac rehab program beginning as soon as practical. 

## 2017-12-19 NOTE — Progress Notes (Signed)
Kamari Buch is a 72 y.o. male patient status post coronary bypass grafting x4 with Dr. Caffie Pinto on 10/26/2017 who presented for his routine follow-up visit.    No diagnosis found. Past Medical History:  Diagnosis Date  . Acid reflux   . Arthropathy   . Atrial fibrillation ([place]) 10/2017  . Coronary artery disease   . Diverticulitis   . Dizziness   . Fatigue   . Gout   . Hepatitis A age 61  . Hyperlipemia   . Hypogonadism male   . Knee pain   . Night sweats   . Non compliance with medical treatment   . Non-STEMI (non-ST elevated myocardial infarction) ([place]) 10/2017  . Palpitations   . Seasonal allergies   . SOB (shortness of breath)   . Vitamin D deficiency    No past surgical history pertinent negatives on file.  Scheduled Meds: Current Outpatient Medications on File Prior to Visit  Medication Sig Dispense Refill  . acetaminophen (TYLENOL) 325 MG tablet Take 325 mg every 6 (six) hours as needed by mouth for mild pain.    Marland Kitchen aspirin EC 81 MG EC tablet Take 1 tablet (81 mg total) by mouth daily.    . carvedilol (COREG) 3.125 MG tablet Take 1 tablet (3.125 mg total) by mouth 2 (two) times daily. 60 tablet 3  . Cholecalciferol (VITAMIN D3) 5000 units CAPS Take 5,000 Units daily by mouth.     . colchicine 0.6 MG tablet Take 0.6 mg by mouth daily as needed (gout).     . febuxostat (ULORIC) 40 MG tablet Take 40 mg by mouth daily.    . hydrocortisone cream 1 % Apply 1 application topically as needed for itching. Do NOT apply to sternal wound 30 g 0   No current facility-administered medications on file prior to visit.     Allergies  Allergen Reactions  . Aspirin Other (See Comments)    Will flare up gout Will flare up gout  . Crestor [Rosuvastatin Calcium]     Reported intolerance to Lipitor in the past Crestor 40mg  patient reported muscle aches, dose reduced to rechallenge    Blood pressure (!) 122/58, pulse (!) 33, resp. rate 18, height 5' 9.5" (1.765 m), weight 152 lb  12.8 oz (69.3 kg), SpO2 99 %.  Subjective : Mr. Diaz presented today for his routine follow-up status post CABG x4.  He feels he has been recovering well.  He has been getting around okay.  He does have an irregular heart rate this visit with a pulse rate in the 30s.  He does have fatigue at times, however is mostly asymptomatic.  He was recently started on Coreg last week by the cardiology office.  Objective: Cor: irregularly irregular, no murmur Pulm: CTA bilaterally Abd: diverticular drain Wound: c/d/i Ext: no edema  Assessment & Plan  A rhythm strip was obtained at this visit which showed ventricular bigeminy.  I discussed the strip with Dr. [name] who suggested continuing the Coreg for this time.  I was able to schedule a cardiology appointment for Thursday for further evaluation since we do not have a 12-lead EKG available in this office.  Otherwise, he is healing well from surgery.  All his incisions are clean and dry.  There were some residual suture knots which I clipped for the patient today.  His Endo vein sites look well-healed.  His recent INR was 2.59.  I provided him with another prescription for warfarin at this visit so that he does  not run out.  He still has his diverticular drain for his diverticulitis which is being monitored.  He may need surgery for this in the future.  Since he is no longer requiring narcotic pain medicine he is cleared to drive.  The patient does confess that he had already begun driving.  We are happy to provide a cardiac rehab referral if needed in the future.  The patient has been very mobile at home and goes up 3 flights of stairs to his apartment every day.  He only gets short of breath towards the end of his climb.  Otherwise, he is happy with his progress.  He will need to follow-up with [place] on an as-needed basis.  As mentioned earlier I have scheduled a cardiology appointment for Thursday at 11:30 AM for further rhythm evaluation.  He did continue the  Coreg at this time.  He had no further questions and he knows that he can call if any questions or concerns arise.  Elgie Collard 12/19/2017

## 2017-12-19 NOTE — Progress Notes (Unsigned)
r 

## 2017-12-21 ENCOUNTER — Ambulatory Visit
Admission: RE | Admit: 2017-12-21 | Discharge: 2017-12-21 | Disposition: A | Discharge status: Home / Self Care | Payer: Medicare Other | Source: Ambulatory Visit | Attending: Surgery | Admitting: Surgery

## 2017-12-21 ENCOUNTER — Encounter: Payer: Self-pay | Admitting: Radiology

## 2017-12-21 ENCOUNTER — Other Ambulatory Visit: Payer: Self-pay | Admitting: Surgery

## 2017-12-21 DIAGNOSIS — K651 Peritoneal abscess: Secondary | ICD-10-CM

## 2017-12-21 HISTORY — PX: IR RADIOLOGIST EVAL & MGMT: IMG5224

## 2017-12-21 MED ORDER — IOPAMIDOL (ISOVUE-300) INJECTION 61%
100.0000 mL | Freq: Once | INTRAVENOUS | Status: AC | PRN
  Administered 2017-12-21: 100 mL via INTRAVENOUS

## 2017-12-21 MED FILL — MONOJECT 0.9% NA CL SYRING: 0.9 | 12 days supply | Qty: 120 | Fill #1

## 2017-12-21 MED FILL — AMOX-CLAV 500-125 MG TABLET: 500-125 | 14 days supply | Qty: 28 | Fill #0

## 2017-12-21 NOTE — Progress Notes (Signed)
Chief Complaint: Patient was seen in consultation today for follow up diverticular abscess drain at the request of [place] M  Referring Physician(s): White,Christopher M  History of Present Illness: Dean [name] is a 72 y.o. male who developed diverticulitis with abscess formation. He underwent perc drain placement on 12/28. He's been connected to bulb suction and he returns today for repeat CT and drain injection. Per pt output is still cloudy/beige, but less volume than after initial placement. He denies fevers, chills. Abd pain. Appetite is good, bowels moving normally. He is no longer on any antibiotics.  Past Medical History:  Diagnosis Date  . Acid reflux   . Arthropathy   . Atrial fibrillation ([place]) 10/2017  . Coronary artery disease   . Diverticulitis   . Dizziness   . Fatigue   . Gout   . Hepatitis A age 51  . Hyperlipemia   . Hypogonadism male   . Knee pain   . Night sweats   . Non compliance with medical treatment   . Non-STEMI (non-ST elevated myocardial infarction) ([place]) 10/2017  . Palpitations   . Seasonal allergies   . SOB (shortness of breath)   . Vitamin D deficiency     Past Surgical History:  Procedure Laterality Date  . COLONOSCOPY  ~ 2014   Dr Ferdinand Lango in [facility].  Diverticulosis.  pt denies colon polyps.    . CORONARY ARTERY BYPASS GRAFT N/A 10/26/2017   Procedure: CORONARY ARTERY BYPASS GRAFTING (CABG) x four , using left internal mammary artery and bilateral legs greater saphenous vein harvested endoscopically;  Surgeon: Gaye Pollack, MD;  Location: [facility];  Service: Open Heart Surgery;  Laterality: N/A;  . LEFT HEART CATH AND CORONARY ANGIOGRAPHY N/A 10/25/2017   Procedure: LEFT HEART CATH AND CORONARY ANGIOGRAPHY;  Surgeon: Leonie Man, MD;  Location: [facility];  Service: Cardiovascular;  Laterality: N/A;  . TEE WITHOUT CARDIOVERSION N/A 10/26/2017   Procedure: TRANSESOPHAGEAL ECHOCARDIOGRAM (TEE);  Surgeon:  Gaye Pollack, MD;  Location: [facility];  Service: Open Heart Surgery;  Laterality: N/A;    Allergies: Aspirin and Crestor [rosuvastatin calcium]  Medications: Prior to Admission medications   Medication Sig Start Date End Date Taking? Authorizing Provider  acetaminophen (TYLENOL) 325 MG tablet Take 325 mg every 6 (six) hours as needed by mouth for mild pain.    [provider]  aspirin EC 81 MG EC tablet Take 1 tablet (81 mg total) by mouth daily. 11/06/17   Nani Skillern, PA-C  carvedilol (COREG) 3.125 MG tablet Take 1 tablet (3.125 mg total) by mouth 2 (two) times daily. 12/12/17 04/11/18  Leanor Kail, PA  Cholecalciferol (VITAMIN D3) 5000 units CAPS Take 5,000 Units daily by mouth.     [provider]  colchicine 0.6 MG tablet Take 0.6 mg by mouth daily as needed (gout).     [provider]  febuxostat (ULORIC) 40 MG tablet Take 40 mg by mouth daily.    [provider]  hydrocortisone cream 1 % Apply 1 application topically as needed for itching. Do NOT apply to sternal wound 11/06/17   Lars Pinks M, PA-C  warfarin (COUMADIN) 1 MG tablet Take 1 tablet (1 mg total) by mouth daily at 6 PM. 12/19/17   Elgie Collard, PA-C     Family History  Problem Relation Age of Onset  . Hyperlipidemia Mother   . Alzheimer's disease Mother   . Hyperlipidemia Brother   . Alzheimer's disease  Maternal Grandmother   . Colon cancer Neg Hx   . Esophageal cancer Neg Hx   . Stomach cancer Neg Hx     Social History   Socioeconomic History  . Marital status: Single    Spouse name: Not on file  . Number of children: Not on file  . Years of education: Not on file  . Highest education level: Not on file  Social Needs  . Financial resource strain: Not on file  . Food insecurity - worry: Not on file  . Food insecurity - inability: Not on file  . Transportation needs - medical: Not on file  . Transportation needs - non-medical: Not on file    Occupational History  . Occupation: retired  Tobacco Use  . Smoking status: Former Research scientist (life sciences)  . Smokeless tobacco: Never Used  Substance and Sexual Activity  . Alcohol use: Yes    Alcohol/week: 0.0 oz    Comment: once monthly  . Drug use: No  . Sexual activity: Not on file  Other Topics Concern  . Not on file  Social History Narrative  . Not on file      Review of Systems: A 12 point ROS discussed and pertinent positives are indicated in the HPI above.  All other systems are negative.  Review of Systems  Vital Signs: There were no vitals taken for this visit.  Physical Exam Abd: soft, NT, ND Drain intact, site clean. Output murky beige with some flecks of debris  Imaging: Dg Chest 2 View  Result Date: 12/19/2017 CLINICAL DATA:  History of CABG in November 2018. EXAM: CHEST  2 VIEW COMPARISON:  11/16/2017; 10/24/2017 FINDINGS: Grossly unchanged cardiac silhouette and mediastinal contours post median sternotomy and CABG. There is persistent mild elevation/eventration involving the anteromedial aspect of the right hemidiaphragm. No focal airspace opacities. No pleural effusion or pneumothorax. No evidence of edema. No acute osseus abnormalities. Stigmata of DISH within the thoracic spine. IMPRESSION: No acute cardiopulmonary disease. Electronically Signed   By: Sandi Mariscal M.D.   On: 12/19/2017 14:42   Ct Abdomen Pelvis W Contrast  Result Date: 12/21/2017 CLINICAL DATA:  72 year old with a diverticular abscess. Percutaneous drain placed on 12/02/2017. EXAM: CT ABDOMEN AND PELVIS WITH CONTRAST TECHNIQUE: Multidetector CT imaging of the abdomen and pelvis was performed using the standard protocol following bolus administration of intravenous contrast. CONTRAST:  127mL ISOVUE-300 IOPAMIDOL (ISOVUE-300) INJECTION 61% COMPARISON:  CT 11/25/2017 FINDINGS: Lower chest: Prior median sternotomy. Stable punctate nodules in the right lower lobe, at least 1 of these are calcified. No pleural  effusions. Hepatobiliary: Stable 8 mm low-density structure in the right hepatic lobe. Focal fat near the falciform ligament. Normal appearance of the gallbladder. No biliary dilatation. Portal venous system is patent. Pancreas: Normal appearance of the pancreas without inflammation or duct dilatation. Spleen: Normal appearance of spleen without enlargement. Adrenals/Urinary Tract: Adrenal glands are normal. No suspicious renal lesions. No hydronephrosis. Urinary bladder is decompressed. Stomach/Bowel: Normal appearance of the stomach and small bowel. Diverticulosis involving the sigmoid colon and left colon. Persistent pericolonic inflammatory changes around the sigmoid colon. In fact, there may be slightly increased pericolonic inflammation. Anterior percutaneous drain placed in the pelvis and the abscess between the sigmoid colon and urinary bladder has nearly resolved. However, there continues to be an irregular gas-filled collection cephalad to the sigmoid colon. This air-fluid collection measures up to 2 cm in size and may be associated with more than one diverticulum. This gas-filled pericolonic collection has slightly decreased in  size but slightly increased inflammatory changes in this area. There is also concern for an irregular diverticulum or pericolonic collection just posterior to the sigmoid colon on sequence 3, image 68 that measures up to 1.6 cm, previously measured 1.4 cm. Vascular/Lymphatic: Atherosclerotic disease in the aorta and iliac arteries. 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 Distal abdominal aorta is ectatic measuring up to 2.8 cm and similar to the recent comparison examination. 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 Left common iliac artery measures up to 2.2 cm and stable. Right common iliac artery is stable measuring 2.2 cm. No significant lymph node enlargement in the abdomen or  pelvis. Reproductive: Prostate is unremarkable. Other: Again noted are fluid density structures in the bilateral inguinal canals. Unclear if these are associated with testicles. No free fluid in the pelvis. Musculoskeletal: No acute bone abnormality. IMPRESSION: Anterior pelvic abscess has nearly resolved since placement of the percutaneous drain. Persistent inflammatory changes around the sigmoid colon compatible with diverticulitis. Persistent gas-filled collection just cephalad to the sigmoid colon which appears to represent a complex pericolonic abscess. This gas-filled collection has slightly decreased in size. Atherosclerotic disease in the aorta and iliac arteries. Stable size of the aneurysms involving the common iliac arteries. 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 Recommend surveillance and vascular surgery consultation. Electronically Signed   By: Markus Daft M.D.   On: 12/21/2017 10:13   Ct Abdomen Pelvis W Contrast  Result Date: 11/25/2017 CLINICAL DATA:  Recent diverticulitis.  Abdominal pain persists. EXAM: CT ABDOMEN AND PELVIS WITH CONTRAST TECHNIQUE: Multidetector CT imaging of the abdomen and pelvis was performed using the standard protocol following bolus administration of intravenous contrast. CONTRAST:  152mL ISOVUE-300 IOPAMIDOL (ISOVUE-300) INJECTION 61% COMPARISON:  10/21/2017 FINDINGS: Lower chest: No acute abnormality. Hepatobiliary: Tiny hypodense lesion within the right liver lobe is too small to characterize, stable compared to the previous study and likely reflects a benign cyst. Diffuse low attenuation of the liver as can be seen with hepatic steatosis. No suspicious mass or lesion within the liver. Gallbladder appears normal. No bile duct dilatation. Pancreas: Unremarkable. No pancreatic ductal dilatation or surrounding inflammatory changes. Spleen: Normal in size without focal  abnormality. Adrenals/Urinary Tract: Adrenal glands are unremarkable. Kidneys are normal, without renal calculi, focal lesion, or hydronephrosis. Bladder is unremarkable. Stomach/Bowel: Stomach is within normal limits. Large amount stool throughout the colon. Sigmoid colon diverticulosis with severe bowel wall thickening and perisigmoid ule inflammatory changes. Peridiverticular complex fluid collection with air within the collection and peripheral enhancement measuring 3.8 x 3.5 x 3.9 cm abutting the dome of the bladder. 1.8 cm primarily air-filled abscess or contained perforation along the superior margin of the sigmoid colon with surrounding inflammatory changes. Vascular/Lymphatic: Abdominal aortic atherosclerosis. Infrarenal abdominal aortic ectasia measuring 2.9 x 2.7 cm. 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 Reproductive: Prostate is unremarkable. Other: No abdominal wall hernia or abnormality. No abdominopelvic ascites. Musculoskeletal: No acute osseous abnormality. No aggressive osseous lesion. IMPRESSION: 1. Persistent diverticulitis of the sigmoid colon. Peridiverticular abscess measuring 3.8 x 3.5 x 3.9 cm abutting the dome of the bladder which has enlarged compared with the prior examination. 2. 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 Recommend followup by ultrasound in 5 years. This recommendation follows ACR consensus guidelines: White  Paper of the ACR Incidental Findings Committee II on Vascular Findings. J Am Coll Radiol 2013; 10:789-794. Electronically Signed   By: Kathreen Devoid   On: 11/25/2017 15:19   Portable Chest 1 View  Result Date: 11/26/2017 CLINICAL DATA:  Shortness of Breath EXAM: PORTABLE CHEST 1 VIEW COMPARISON:  10/30/2017 FINDINGS: Prior CABG. Mild cardiomegaly. Lungs clear. No effusions or pneumothorax. No acute bony abnormality. IMPRESSION: No active disease.  Electronically Signed   By: Rolm Baptise M.D.   On: 11/26/2017 10:08   Ct Image Guided Drainage By Percutaneous Catheter  Result Date: 12/02/2017 INDICATION: 72 year old male with a history of diverticulitis and abscess EXAM: CT GUIDED DRAINAGE OF PELVIC ABSCESS MEDICATIONS: The patient is currently admitted to the hospital and receiving intravenous antibiotics. The antibiotics were administered within an appropriate time frame prior to the initiation of the procedure. ANESTHESIA/SEDATION: 2.5 mg IV Versed 100 mcg IV Fentanyl Moderate Sedation Time:  10 minutes The patient was continuously monitored during the procedure by the interventional radiology nurse under my direct supervision. COMPLICATIONS: None TECHNIQUE: Informed written consent was obtained from the patient after a thorough discussion of the procedural risks, benefits and alternatives. All questions were addressed. Maximal Sterile Barrier Technique was utilized including caps, mask, sterile gowns, sterile gloves, sterile drape, hand hygiene and skin antiseptic. A timeout was performed prior to the initiation of the procedure. PROCEDURE: The lower abdomen was prepped with chlorhexidine in a sterile fashion, and a sterile drape was applied covering the operative field. A sterile gown and sterile gloves were used for the procedure. Local anesthesia was provided with 1% Lidocaine. Scout CT of the pelvis was performed for planning purposes. The patient was then prepped and draped in the usual sterile fashion and 1% lidocaine was used for local anesthesia. Using CT guidance, trocar needle was advanced into the pelvic abscess just superior to the bladder dome. Once we confirmed the needle tip position, modified Seldinger technique was used to place a 10 French pigtail drain catheter. Approximately 20 cc of purulent material was aspirated. Catheter was formed, sutured in position, an attached to bulb suction. Patient tolerated the procedure well and  remained hemodynamically stable throughout. No complications were encountered and no significant blood loss. FINDINGS: Small gas and fluid collection persists just above the urinary bladder, compatible with ongoing pelvic abscess. Pigtail catheter placed into the abscess drained approximately 20 cc of purulent material. IMPRESSION: Status post CT-guided drainage of pelvic abscess. Signed, Dulcy Fanny. Earleen Newport, DO [facility] Electronically Signed   By: Corrie Mckusick D.O.   On: 12/02/2017 15:16    Labs:  CBC: Recent Labs    12/01/17 0713 12/02/17 0626 12/03/17 0530 12/04/17 1038  WBC 6.4 5.1 3.5* 5.7  HGB 9.8* 9.2* 9.3* 11.3*  HCT 31.2* 28.5* 29.2* 35.9*  PLT 263 231 263 282    COAGS: Recent Labs    10/25/17 0341 10/26/17 1517  12/01/17 1520 12/02/17 0626 12/03/17 0530 12/04/17 0505  INR 1.16 1.46   < > 1.39 1.30 1.29 1.44  APTT 77* 39*  --   --   --   --   --    < > = values in this interval not displayed.    BMP: Recent Labs    12/01/17 0713 12/02/17 0626 12/03/17 0530 12/04/17 0505  NA 135 134* 136 136  K 3.9 3.3* 3.3* 3.9  CL 99* 102 101 105  CO2 24 22 24 23   GLUCOSE 94 88 95 86  BUN 6 6 7 6   CALCIUM 8.8* 8.6* 8.5* 8.7*  CREATININE 1.20 1.11 1.00 0.97  GFRNONAA 60* >60 >60 >60  GFRAA >60 >60 >60 >60    LIVER FUNCTION TESTS: Recent Labs    10/07/17 1541 10/22/17 0838 11/26/17 0812  BILITOT 0.5 0.6 0.7  AST 17 19 16   ALT 10* 7* 11*  ALKPHOS 77 58 75  PROT 7.4 7.1 7.0  ALBUMIN 3.2* 3.1* 2.8*    TUMOR MARKERS: No results for input(s): AFPTM, CEA, CA199, CHROMGRNA in the last 8760 hours.  Assessment and Plan: Diverticular abscess S/p perc drain on 12/28 CT shows interval improvement in abscess but continued inflammatory changes and a couple other smaller abscesses. See full reports. 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 See full report We will change him  to gravity bag instead of suction bulb. He can continue gentle flushes with 5 mL only once daily. Recommend he be in contact with his surgeon but we will otherwise set him up for repeat CT and drain injection in about 2 weeks.  Thank you for this interesting consult.  I greatly enjoyed meeting Dean [name] and look forward to participating in their care.  A copy of this report was sent to the requesting provider on this date.  Electronically Signed: Ascencion Dike 12/21/2017, 10:49 AM   I spent a total of 20 minutes in face to face in clinical consultation, greater than 50% of which was counseling/coordinating care for perc abscess drain

## 2017-12-22 ENCOUNTER — Encounter: Payer: Self-pay | Admitting: Physician Assistant

## 2017-12-22 ENCOUNTER — Ambulatory Visit: Payer: Medicare Other | Admitting: Physician Assistant

## 2017-12-22 ENCOUNTER — Other Ambulatory Visit: Payer: Self-pay | Admitting: Physician Assistant

## 2017-12-22 VITALS — BP 100/40 | HR 50 | Resp 16 | Ht 69.5 in | Wt 157.2 lb

## 2017-12-22 DIAGNOSIS — I499 Cardiac arrhythmia, unspecified: Secondary | ICD-10-CM

## 2017-12-22 DIAGNOSIS — I498 Other specified cardiac arrhythmias: Secondary | ICD-10-CM

## 2017-12-22 DIAGNOSIS — E785 Hyperlipidemia, unspecified: Secondary | ICD-10-CM | POA: Diagnosis not present

## 2017-12-22 DIAGNOSIS — I255 Ischemic cardiomyopathy: Secondary | ICD-10-CM | POA: Diagnosis not present

## 2017-12-22 DIAGNOSIS — Z951 Presence of aortocoronary bypass graft: Secondary | ICD-10-CM | POA: Diagnosis not present

## 2017-12-22 DIAGNOSIS — I480 Paroxysmal atrial fibrillation: Secondary | ICD-10-CM

## 2017-12-22 DIAGNOSIS — I25700 Atherosclerosis of coronary artery bypass graft(s), unspecified, with unstable angina pectoris: Secondary | ICD-10-CM

## 2017-12-22 MED ORDER — FUROSEMIDE 20 MG PO TABS: 20.0000 mg | ORAL_TABLET | Freq: Every day | ORAL | 1 refills | Status: DC | PRN

## 2017-12-22 NOTE — Patient Instructions (Signed)
Medication Instructions:   TAKE LASIX 20 MG  AS NEEDED FOR EDEMA   If you need a refill on your cardiac medications before your next appointment, please call your pharmacy.  Labwork: BMET AND MAG TODAY    Testing/Procedures: NONE ORDERED  TODAY    Follow-Up:  AS SCHEDULED WITH [facility] IN [facility]IL   Any Other Special Instructions Will Be Listed Below (If Applicable).

## 2017-12-22 NOTE — Progress Notes (Signed)
Cardiology Office Note    Date:  12/22/2017   ID:  Dean [name], DOB 10/08/1946, MRN [id number]  PCP:  [name], PA-C  Cardiologist:  Dr. Acie Fredrickson  Chief Complaint: ventricular bigeminy  History of Present Illness:   Dean [name] is a 72 y.o. male with a hx of coronary artery disease s/p CABG, paroxysmal atrial fibrillation, ischemic cardiomyopathy and HLD presents for follow up.   Hx of known coronary artery disease with CABG x4 on 11/02/17 by Dr. Cyndia Bent had post op atrial fibrillation. Converted to NSR on IV amiodarone.   Admitted 11/26/17-12/04/17 for acute diverticulitis with abscess s/p percutaneous drain placement per IR. INR has since been reversed with FFP and vit K for IR. Resumed coumadin at discharge. Resting bradycardia at night. Avoid BB. Amiodarone discontinued by CTS.   He was doing well on cardiac stand point when last seen by me 12/12/16 for follow up. No chest pain. Plan to start BB once all GI has been resolved and after ACE/ARB.   The seen by [facility] 12/19/16 for follow up and noted ventricular bigeminy (no EKG for review). Recommended to continue Coreg.   Here today for follow up.  The patient still has a colostomy bag.  It was checked yesterday.  Plan for another 14 days course of antibiotic.  He denies chest pain, shortness of breath, dizziness, palpitation, orthopnea, PND, syncope or melena.  He eats excess salt intermittently.  Compliant with medication.  Past Medical History:  Diagnosis Date  . Acid reflux   . Arthropathy   . Atrial fibrillation ([place]) 10/2017  . Coronary artery disease   . Diverticulitis   . Dizziness   . Fatigue   . Gout   . Hepatitis A age 31  . Hyperlipemia   . Hypogonadism male   . Knee pain   . Night sweats   . Non compliance with medical treatment   . Non-STEMI (non-ST elevated myocardial infarction) ([place]) 10/2017  . Palpitations   . Seasonal allergies   . SOB (shortness of breath)   . Vitamin D deficiency      Past Surgical History:  Procedure Laterality Date  . COLONOSCOPY  ~ 2014   Dr Ferdinand Lango in [facility].  Diverticulosis.  pt denies colon polyps.    . CORONARY ARTERY BYPASS GRAFT N/A 10/26/2017   Procedure: CORONARY ARTERY BYPASS GRAFTING (CABG) x four , using left internal mammary artery and bilateral legs greater saphenous vein harvested endoscopically;  Surgeon: Gaye Pollack, MD;  Location: [place] OR;  Service: Open Heart Surgery;  Laterality: N/A;  . IR RADIOLOGIST EVAL & MGMT  12/21/2017  . LEFT HEART CATH AND CORONARY ANGIOGRAPHY N/A 10/25/2017   Procedure: LEFT HEART CATH AND CORONARY ANGIOGRAPHY;  Surgeon: Leonie Man, MD;  Location: [place] [facility];  Service: Cardiovascular;  Laterality: N/A;  . TEE WITHOUT CARDIOVERSION N/A 10/26/2017   Procedure: TRANSESOPHAGEAL ECHOCARDIOGRAM (TEE);  Surgeon: Gaye Pollack, MD;  Location: [place];  Service: Open Heart Surgery;  Laterality: N/A;    Current Medications: Prior to Admission medications   Medication Sig Start Date End Date Taking? Authorizing Provider  acetaminophen (TYLENOL) 325 MG tablet Take 325 mg every 6 (six) hours as needed by mouth for mild pain.    [provider]  aspirin EC 81 MG EC tablet Take 1 tablet (81 mg total) by mouth daily. 11/06/17   Nani Skillern, PA-C  carvedilol (COREG) 3.125 MG tablet Take 1 tablet (3.125 mg total) by mouth  2 (two) times daily. 12/12/17 04/11/18  Leanor Kail, PA  Cholecalciferol (VITAMIN D3) 5000 units CAPS Take 5,000 Units daily by mouth.     [provider]  colchicine 0.6 MG tablet Take 0.6 mg by mouth daily as needed (gout).     [provider]  febuxostat (ULORIC) 40 MG tablet Take 40 mg by mouth daily.    [provider]  hydrocortisone cream 1 % Apply 1 application topically as needed for itching. Do NOT apply to sternal wound 11/06/17   Lars Pinks M, PA-C  warfarin (COUMADIN) 1 MG tablet Take 1 tablet (1 mg total) by mouth  daily at 6 PM. 12/19/17   Elgie Collard, PA-C    Allergies:   Aspirin and Crestor [rosuvastatin calcium]   Social History   Socioeconomic History  . Marital status: Single    Spouse name: None  . Number of children: None  . Years of education: None  . Highest education level: None  Social Needs  . Financial resource strain: None  . Food insecurity - worry: None  . Food insecurity - inability: None  . Transportation needs - medical: None  . Transportation needs - non-medical: None  Occupational History  . Occupation: retired  Tobacco Use  . Smoking status: Former Research scientist (life sciences)  . Smokeless tobacco: Never Used  Substance and Sexual Activity  . Alcohol use: Yes    Alcohol/week: 0.0 oz    Comment: once monthly  . Drug use: No  . Sexual activity: None  Other Topics Concern  . None  Social History Narrative  . None     Family History:  The patient's family history includes Alzheimer's disease in his maternal grandmother and mother; Hyperlipidemia in his brother and mother.   ROS:   Please see the history of present illness.    ROS All other systems reviewed and are negative.   PHYSICAL EXAM:   VS:  BP (!) 100/40   Pulse (!) 50   Resp 16   Ht 5' 9.5" (1.765 m)   Wt 157 lb 3.2 oz (71.3 kg)   SpO2 98%   BMI 22.88 kg/m    GEN: Well nourished, well developed, in no acute distress  HEENT: normal  Neck: no JVD, carotid bruits, or masses Cardiac: RRR; no murmurs, rubs, or gallops, trace BL Le  edema  Respiratory:  clear to auscultation bilaterally, normal work of breathing GI: soft, nontender, nondistended, + BS MS: no deformity or atrophy  Skin: warm and dry, no rash Neuro:  Alert and Oriented x 3, Strength and sensation are intact Psych: euthymic mood, full affect  Wt Readings from Last 3 Encounters:  12/22/17 157 lb 3.2 oz (71.3 kg)  12/19/17 152 lb 12.8 oz (69.3 kg)  12/12/17 152 lb (68.9 kg)      Studies/Labs Reviewed:   EKG:  EKG is ordered today.  The ekg  ordered today demonstrates sinus rhythm with a rate of 58 bpm with ventricular bigeminy  Recent Labs: 10/25/2017: TSH 5.811 11/26/2017: ALT 11 11/27/2017: Magnesium 1.9 12/04/2017: BUN 6; Creatinine, Ser 0.97; Hemoglobin 11.3; Platelets 282; Potassium 3.9; Sodium 136   Lipid Panel    Component Value Date/Time   CHOL 128 10/25/2017 0341   TRIG 104 10/25/2017 0341   HDL 39 (L) 10/25/2017 0341   CHOLHDL 3.3 10/25/2017 0341   VLDL 21 10/25/2017 0341   LDLCALC 68 10/25/2017 0341    Additional studies/ records that were reviewed today include:   Echocardiogram: 10/25/17 Study  Conclusions  - Left ventricle: The cavity size was mildly dilated. Systolic function was moderately to severely reduced. The estimated ejection fraction was in the range of 30% to 35%. Severe hypokinesis of the anterolateral, inferolateral, and inferior myocardium; consistent with ischemia in the distribution of the right coronary and left circumflex coronary artery. Features are consistent with a pseudonormal left ventricular filling pattern, with concomitant abnormal relaxation and increased filling pressure (grade 2 diastolic dysfunction). - Aortic valve: There was mild regurgitation. - Mitral valve: There was mild to moderate regurgitation directed centrally. The acceleration rate of the regurgitant jet was reduced, consistent with a low dP/dt. - Left atrium: The atrium was mildly to moderately dilated.  Impressions:  - Regional wall motion abnormalities and decreased LV systolic function are new since August 2018.  Cardiac Catheterization: 10/25/17 LEFT HEART CATH AND CORONARY ANGIOGRAPHY  Conclusion     Dist LM to Ost LAD lesion is 80% stenosed with 80% stenosed side branch in Ost Cx.  Prox Cx lesion is 99% stenosed -after the ostial lesion. There is TIMI I flow distal.  Ost LAD to Prox LAD lesion is 60% stenosed. Prox LAD lesion is 80% stenosed. Prox LAD to Mid  LAD lesion is 70% stenosed.  Prox RCA to Mid RCA lesion is 100% stenosed. The 2 major downstream branches of the RCA (RPL & RPDA) fill via collaterals with brisk filling.  There is severe left ventricular systolic dysfunction. The left ventricular ejection fraction is less than 25% by visual estimate.  LV end diastolic pressure is moderately elevated.  Severe multivessel CAD with 80% distal left main into circumflex, 80% mid LAD, subtotal 99% proximal circumflex and 100% occluded RCA.  The RCA fills via collaterals from the LAD. All distal vessels appeared to have good targets for bypass. 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  The patient is hemodynamically stable and not actively having anginal symptoms. We have called CT surgical consultation for urgent CABG. Unfortunately he is on Plavix. If this will delay surgery, may need to consider mechanical support with possible balloon pump versus even Impella if symptoms were to worsen.  For now he will transfer to the CCU. We will restart IV heparin 6 hours after TR band removal. We will keep him n.p.o. for now pending surgical consultation.  Recommend high-dose statin. If blood pressure tolerates, would use beta-blocker    ASSESSMENT & PLAN:    1. CAD s/p CABG - No angina. Continue ASA and BB.   2. Paroxysmal atrial fibrillation -  Not on BB due to resting bradycardia at night. Continue coumadin (managed by PCP). Hopefully able to DC during follow up visit.   3. HLD - 10/25/2017: Cholesterol 128; HDL 39; LDL Cholesterol 68; Triglycerides 104; VLDL 21  - Statin intolerance. LDL at goal.   4. ICM - LVEF of 30-35% with grade 2 DD. Trace BL LE edema.  Advised to cut back on elevate leg.  Try over-the-counter salt.  Compression stocking.  As needed Lasix 20 mg for edema. -Continue beta-blocker.  Unable to add  ACE/ARB to soft low blood pressure. - Echo during next visit to reevaluated LVEF.    5 ventricular bigeminy.  -Rate in 50s.  He denies any palpitation or dizziness.  He is only complaining of fatigue. -Check be met and magnesium.  Continue beta-blocker.       Medication Adjustments/Labs and Tests Ordered: Current medicines are reviewed at length with the patient today.  Concerns regarding medicines  are outlined above.  Medication changes, Labs and Tests ordered today are listed in the Patient Instructions below. Patient Instructions  Medication Instructions:   TAKE LASIX 20 MG  AS NEEDED FOR EDEMA   If you need a refill on your cardiac medications before your next appointment, please call your pharmacy.  Labwork: BMET AND MAG TODAY    Testing/Procedures: NONE ORDERED  TODAY    Follow-Up:  AS SCHEDULED WITH NA[facility]ER IN [facility]IL   Any Other Special Instructions Will Be Listed Below (If Applicable).                                                                                                                                                      Jarrett Soho, [place]  12/22/2017 11:54 AM    [facility] [place], [place], [place]  [address] Phone: [phone]; Fax: [phone]

## 2017-12-23 ENCOUNTER — Telehealth: Payer: Self-pay | Admitting: Physician Assistant

## 2017-12-23 LAB — BASIC METABOLIC PANEL
BUN / CREAT RATIO: 14 (ref 10–24)
BUN: 18 mg/dL (ref 8–27)
CHLORIDE: 102 mmol/L (ref 96–106)
CO2: 22 mmol/L (ref 20–29)
Calcium: 8.9 mg/dL (ref 8.6–10.2)
Creatinine, Ser: 1.29 mg/dL — ABNORMAL HIGH (ref 0.76–1.27)
GFR calc non Af Amer: 56 mL/min/{1.73_m2} — ABNORMAL LOW (ref >59)
GFR, EST AFRICAN AMERICAN: 65 mL/min/{1.73_m2} (ref >59)
Glucose: 93 mg/dL (ref 65–99)
Potassium: 4.6 mmol/L (ref 3.5–5.2)
Sodium: 139 mmol/L (ref 134–144)

## 2017-12-23 LAB — MAGNESIUM: Magnesium: 2 mg/dL (ref 1.6–2.3)

## 2017-12-23 NOTE — Telephone Encounter (Signed)
-----   Message from [place], [place] sent at 12/23/2017  9:03 AM EST ----- electrolytes normal. Kidney function minimally elevated. Likely due to mild volume overload as discussed yesterday. Take PRN lasix.

## 2017-12-23 NOTE — Telephone Encounter (Signed)
Returned pts call and went over his lab results. See result note.

## 2017-12-23 NOTE — Telephone Encounter (Signed)
New message ° ° °Patient calling for lab results. Please call °

## 2017-12-29 ENCOUNTER — Telehealth: Payer: Self-pay

## 2017-12-29 ENCOUNTER — Ambulatory Visit: Payer: Self-pay | Admitting: Surgery

## 2017-12-29 NOTE — Telephone Encounter (Signed)
Clearance routed via Epic to requesting office.

## 2017-12-29 NOTE — Telephone Encounter (Signed)
   [facility] Pre-operative Risk Assessment    Request for surgical clearance:  1. What type of surgery is being performed? colon   2. When is this surgery scheduled? tbd   3. What type of clearance is required (medical clearance vs. Pharmacy clearance to hold med vs. Both)? both  4. Are there any medications that need to be held prior to surgery and how long? Blood thinner   5. Practice name and name of physician performing surgery? [facility]   6. What is your office phone and fax number? [phone]   7. Anesthesia type (None, local, MAC, general) ? General   Dean [name]  Dean [name] 12/29/2017, 1:19 PM  _________________________________________________________________   (provider comments below)

## 2017-12-29 NOTE — Telephone Encounter (Signed)
The pt was just seen in the office 12/22/17. He is cleared for surgery from a cardiac standpoint. OK to hold Coumadin pre op, resume when possible post op.  Kerin Ransom PA-C 12/29/2017 1:57 PM

## 2017-12-29 NOTE — H&P (Signed)
CC: Hx of perforated diverticulitis with abscess requiring percutaneous drain placement  HPI: Dean [name] is a pleasant [phone] with hx of CAD (s/p CABGx4 11/02/17), ischemic cardiomyopathy (LVEF 30%), gout, afib on warfarin, asymptomatic nephrolithiasis presented to [facility] ER on 11/26/17 with worsening left-sided and lower quadrant abdominal pain.  This has been going on intermittently for a 2 year time frame.  He initially thought this is all related to gas pains.    He was seen by his GI doctor, Dr. Fuller Plan in [facility] and is sent for a CT scan 10/07/17 which showed focally perforated sigmoid diverticulitis and small pericolic abscesses 5.6E3.3IR in size.  He was subsequently seen by Dr. Ninfa Linden and discharged on Cipro Flagyl. He had recurrence of his pain and underwent CT 10/21/17 which showed similar findings. He was sent home on abx with interval improvement.  On 10/26/17, he was admitted with NSTEMI and found to have high-grade left main and severe 3 vessel CAD with severe LV dysfunction.  He was noted and had a normal LV function in August.  He was taken to the OR emergently by the cardiac thoracic surgery team, Dr. Cyndia Bent, for CABG 4 using native vein from both legs.  He recovered well from this and was discharged 11/06/17.  He represented 11/25/17 with crampy left lower quadrant abdominal pain.  He was afebrile and mildly tender in the left lower quadrant.  His [place] blood cell count was normal.  He underwent CAT scan 11/25/17 which demonstrated persistent diverticulitis of the sigmoid colon and abscess measuring 3.8 x 3.5 x 3.9 cm abutting the dome of the bladder which was enlarged relative to his prior CT scans.  He was admitted to the medicine service and his anticoagulation was held.  Once his INR had normalized his taken to interventional radiology for [facility] drain placement which was done successfully and purulent material drained.  He recovered from this and was ultimately discharged home back on  his warfarin.  Since that time he reports doing well having had no further episodes of abdominal pain, fever, chills, nausea, vomiting.  He does have increased frequency of urination but states that his urine is clear, yellow, no stool or gas in his urine stream.  His drain has been in place and draining approximately 20-40 cc a day of purulent fluid.  Last colonoscopy 2014 in [place] at [facility] - had one 27mm polyp removed from the ?rectum. Pathology returned polypoid colonic mucosa, no dysplasia or malignancy - plan based on the endoscopist's note he was to have a repeat in 5 years if the polyp was hyperplastic or 10 years if it was normal tissue  Since seeing him in the office earlier this month, he noted cloudy urine and some gas in his urinary stream. Following all this, his symptoms have dramatically improved. He denies fever/chills/nausea/vomiting.His LLQ pain has resolved. His urinary frequency has also improved a lot.     PMH: CAD (s/p CABGx4 11/02/17), ischemic cardiomyopathy (LVEF 30%), gout (managed by Dr. Flonnie Overman at [facility]), afib on warfarin, asymptomatic nephrolithiasis  PSH: No prior abdominal surgeries  FHx: Denies FHx of malignancy  Social: Denies use of tobacco/EtOH/drugs  ROS: A comprehensive 10 system review of systems was completed with the patient and pertinent findings as noted above.  The patient is a 72 year old male.   Allergies Dean [name], Dean [name]; 12/29/2017 9:17 AM) ASPIRIN   Allergies Reconciled    Medication History Dean [name], Dean [name]; 12/29/2017 9:17 AM) Saline Flush  (  0.9% Solution, 1 (one) Intravenous daily, Taken starting 12/19/2017) Active. (Flush drain daily with 10 ml Normal Saline) Augmentin  (500-125MG  Tablet, 1 (one) Oral two times daily, Taken starting 12/21/2017) Active. Warfarin Sodium  (1MG  Tablet, Oral) Active. Uloric  (40MG  Tablet, Oral) Active. Hydrocortisone  (1% Cream, External) Active. Tylenol  (325MG  Tablet, Oral)  Active. Coreg  (3.125MG  Tablet, Oral) Active. Vitamin D3  (5000UNIT Tablet, Oral) Active. Medications Reconciled     Review of Systems Dean [name]; 12/29/2017 10:04 AM) General Not Present- Appetite Loss, Chills, Fatigue, Fever, Night Sweats, Weight Gain and Weight Loss. Skin Not Present- Change in Wart/Mole, Dryness, Hives, Jaundice, New Lesions, Non-Healing Wounds, Rash and Ulcer. HEENT Not Present- Earache, Hearing Loss, Hoarseness, Nose Bleed, Oral Ulcers, Ringing in the Ears, Seasonal Allergies, Sinus Pain, Sore Throat, Visual Disturbances, Wears glasses/contact lenses and Yellow Eyes. Respiratory Not Present- Bloody sputum, Chronic Cough, Difficulty Breathing, Snoring and Wheezing. Breast Not Present- Breast Mass, Breast Pain, Nipple Discharge and Skin Changes. Cardiovascular Present- Shortness of Breath. Not Present- Chest Pain, Difficulty Breathing Lying Down, Leg Cramps, Palpitations, Rapid Heart Rate and Swelling of Extremities. Gastrointestinal Present- Change in Bowel Habits, Constipation, Hemorrhoids and Rectal Pain. Not Present- Abdominal Pain, Bloating, Bloody Stool, Chronic diarrhea, Difficulty Swallowing, Excessive gas, Gets full quickly at meals, Indigestion, Nausea and Vomiting. Male Genitourinary Present- Frequency and Nocturia. Not Present- Blood in Urine, Change in Urinary Stream, Impotence, Painful Urination, Urgency and Urine Leakage. Musculoskeletal Not Present- Back Pain, Joint Pain, Joint Stiffness, Muscle Pain, Muscle Weakness and Swelling of Extremities. Neurological Present- Weakness. Not Present- Decreased Memory, Fainting, Headaches, Numbness, Seizures, Tingling, Tremor and Trouble walking. Psychiatric Not Present- Anxiety, Bipolar, Change in Sleep Pattern, Depression, Fearful and Frequent crying. Endocrine Not Present- Cold Intolerance, Excessive Hunger, Hair Changes, Heat Intolerance and New Diabetes. Hematology Present- Blood Thinners and Easy  Bruising. Not Present- Excessive bleeding, Gland problems, HIV and Persistent Infections.  Vitals Dean [name] RMA; 12/29/2017 9:17 AM) 12/29/2017 9:16 AM Weight: 155.8 lb   Height: 69.5 in  Body Surface Area: 1.87 m   Body Mass Index: 22.68 kg/m   Temp.: 97.8 F    Pulse: 86 (Regular)    BP: 110/72 (Sitting, Left Arm, Standard)       Physical Exam Dean [name]; 12/29/2017 10:09 AM) The physical exam findings are as follows: Note: Constitutional: No acute distress; conversant; no deformities Eyes: Moist conjunctiva; no lid lag; anicteric sclerae Neck: Trachea midline; no palpable thyromegaly Lungs: Normal respiratory effort; no tactile fremitus CV: Regular rate and rhythm; no palpable thrill; no pitting edema GI: Abdomen soft, nontender, nondistended; no palpable hepatosplenomegaly; IR drain in place with turbid brown drainage MSK: Normal gait; no clubbing/cyanosis Psychiatric: Appropriate affect; alert and oriented 3 Lymphatic: No palpable cervical or axillary lymphadenopathy    Assessment & Plan Dean Gave M. Li Bobo [name]; 12/29/2017 10:08 AM) DIVERTICULITIS (K57.92) Impression: Mr. 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 Cathie Olden - timing of surgery based on this - ideally in the next month -Once above cleared/completed, will plan laparoscopic vs open sigmoid colectomy; possible ostomy; flexible sigmoidoscopy; cysto/stents (urology); all other indicated procedures. -The anatomy and physiology of the GI tract was again discussed at length with the patient. The  pathophysiology of diverticulitis was discussed at length with associated pictures and illustrations. -The planned  procedure, material risks (including, but not limited to, pain, bleeding, infection, scarring, need for blood transfusion, damage to surrounding structures- blood vessels/nerves/viscus/organs, damage to ureter, urine leak, leak from anastomosis, need for additional procedures, need for stoma which may be permanent, hernia, recurrence, pneumonia, heart attack, stroke, death) benefits and alternatives to surgery were discussed at length. I noted a good probability that the procedure would help improve his symptoms. His questions were answered to his satisfaction and he elected to proceed with surgery.  Signed electronically by Dean Roup, [name] (12/29/2017 10:10 AM)

## 2018-01-02 MED FILL — AMOX-CLAV 500-125 MG TABLET: 500-125 | 14 days supply | Qty: 28 | Fill #0

## 2018-01-02 MED FILL — MONOJECT 0.9% NA CL SYRING: 0.9 | 12 days supply | Qty: 120 | Fill #0

## 2018-01-03 MED FILL — metroNIDAZOLE 500 MG TABS: 500 | 1 days supply | Qty: 6 | Fill #0

## 2018-01-03 MED FILL — NEOMYCIN 500 MG TABLET: 500 | 1 days supply | Qty: 6 | Fill #0

## 2018-01-05 ENCOUNTER — Other Ambulatory Visit: Payer: Medicare Other

## 2018-01-17 MED FILL — MONOJECT 0.9% NA CL SYRING: 0.9 | 36 days supply | Qty: 360 | Fill #0

## 2018-01-18 ENCOUNTER — Telehealth: Payer: Self-pay | Admitting: Cardiology

## 2018-01-18 NOTE — Telephone Encounter (Signed)
Follow Up:    Pt needs specific dates to hold his  Plavix,surgery is scheduled for 02-17-18.

## 2018-01-19 NOTE — Telephone Encounter (Signed)
The patient is cleared for surgery.   Will route pharmacy to review coumadin and Dr. Acie Fredrickson to review Aspirin.

## 2018-01-19 NOTE — Telephone Encounter (Signed)
Noted surgical clearance done by Kerin Ransom includes recommendation for holding warfarin.    Patient with diagnosis of Atrial fibrillation on warfarin for anticoagulation.    CHADS2-VASc score of 2 (AGE, CAD)   Per office protocol, patient can hold warfarin for 5 days prior to procedure. Patient will not need bridging with Lovenox (enoxaparin) around procedure. Please  restart warfarin on the evening of procedure or day after, at discretion of procedure MD   1. Patient warfarin NOT follow by use, should also notify responsible coumadin clinic as well  2.  Please contact cardiologist for plavix recommendations.   Dean [name] PharmD, BCPS, [place] [id number] 01/19/2018 2:56 PM

## 2018-01-20 ENCOUNTER — Other Ambulatory Visit: Payer: Self-pay | Admitting: Urology

## 2018-01-22 NOTE — Telephone Encounter (Signed)
Patient is at low risk for procedure There is a comment about needing to hold Plavix I do not see Plavix on his med list.   He has not had a stent placed.   Agree to hold coumadin for 5 days prior to surgery  / procedure

## 2018-01-23 NOTE — Telephone Encounter (Signed)
Left a message for pt to call back so we can clarify what he is asking in re: to Plavix.

## 2018-01-23 NOTE — Telephone Encounter (Signed)
   Primary Cardiologist:Philip Nahser, MD  Chart reviewed as part of pre-operative protocol coverage.  Clearance was already routed in a previous phone note. Dr. Acie Fredrickson reviewed chart and feels he is low risk for procedure. It appears this message was a patient calling in with a question. [facility] staff, can you clarify with patient about his original question? Our records do not show that he is on Plavix aka clopidogrel. He is on aspirin and warfarin per our records.  In the event that he meant aspirin, will forward to Dr. Acie Fredrickson for input on aspirin. (Not mentioned in last note, but Vin Bhagat sent inquiry earlier in the message train.) The warfarin plan ppears to have already been outlined below but when we have final input on the aspirin we can regroup with the patient and make sure all instructions are understood.   Charlie Pitter, PA-C 01/23/2018, 1:02 PM

## 2018-01-24 NOTE — Telephone Encounter (Signed)
F/U call:  Patient returning call from 01-23-18 in regards to operation on 02-17-18

## 2018-01-24 NOTE — Telephone Encounter (Signed)
I tried to return call to the pt to clarify his medication question. I called, someone did answer the phone though I kept saying hello with no response. I hung up the phone at that time unable to lmom.

## 2018-01-24 NOTE — Telephone Encounter (Signed)
Patient calling back.  Please call patient to clarify what his question is regarding medication to hold for surgery.  His chart does not list Plavix.  We currently show that he is on ASA and Coumadin.  Richardson Dopp, PA-C    01/24/2018 2:14 PM

## 2018-01-24 NOTE — Telephone Encounter (Signed)
F/U Call:  Patient returning call. Attempted to connect live call

## 2018-01-26 ENCOUNTER — Other Ambulatory Visit (HOSPITAL_COMMUNITY): Payer: Self-pay | Admitting: Radiology

## 2018-01-26 DIAGNOSIS — K651 Peritoneal abscess: Secondary | ICD-10-CM

## 2018-01-26 NOTE — Telephone Encounter (Signed)
   Primary Cardiologist: Mertie Moores, MD  Contacted patient and clarified medications.  He is not on Plavix.  He is on warfarin and aspirin.  Advised that it is okay to hold warfarin for 5 days prior to the procedure.  Restart as soon after the procedure as the surgeon will allow.  Advised that since his bypass was in November 2018, he should contact the surgeons if he is asked to hold the aspirin.  As it has been less than 6 months since his bypass surgery, Dr. Servando Snare should decide if it is okay to hold the aspirin for the procedure.  Advised that he should make sure to take his beta-blocker on the morning of the procedure with a sip of water.  I will route this recommendation to the requesting party via Epic fax function and remove from pre-op pool.  Please call with questions.  Rosaria Ferries, PA-C 01/26/2018, 3:32 PM

## 2018-01-26 NOTE — Telephone Encounter (Signed)
Faxed clearance over to [place].

## 2018-01-27 ENCOUNTER — Ambulatory Visit (HOSPITAL_COMMUNITY)
Admission: RE | Admit: 2018-01-27 | Discharge: 2018-01-27 | Disposition: A | Discharge status: Home / Self Care | Payer: Medicare Other | Source: Ambulatory Visit | Attending: Radiology | Admitting: Radiology

## 2018-01-27 ENCOUNTER — Encounter (HOSPITAL_COMMUNITY): Payer: Self-pay | Admitting: Interventional Radiology

## 2018-01-27 ENCOUNTER — Ambulatory Visit (HOSPITAL_COMMUNITY): Payer: Medicare Other

## 2018-01-27 ENCOUNTER — Other Ambulatory Visit (HOSPITAL_COMMUNITY): Payer: Self-pay | Admitting: Radiology

## 2018-01-27 DIAGNOSIS — Z4803 Encounter for change or removal of drains: Secondary | ICD-10-CM | POA: Insufficient documentation

## 2018-01-27 DIAGNOSIS — K651 Peritoneal abscess: Secondary | ICD-10-CM

## 2018-01-27 DIAGNOSIS — Z7982 Long term (current) use of aspirin: Secondary | ICD-10-CM | POA: Diagnosis not present

## 2018-01-27 DIAGNOSIS — K5780 Diverticulitis of intestine, part unspecified, with perforation and abscess without bleeding: Secondary | ICD-10-CM | POA: Insufficient documentation

## 2018-01-27 DIAGNOSIS — Z7901 Long term (current) use of anticoagulants: Secondary | ICD-10-CM | POA: Insufficient documentation

## 2018-01-27 HISTORY — PX: IR CATHETER TUBE CHANGE: IMG717

## 2018-01-27 MED ORDER — LIDOCAINE HCL (PF) 1 % IJ SOLN
INTRAMUSCULAR | Status: DC | PRN
  Administered 2018-01-27: 5 mL

## 2018-01-27 MED ORDER — IOPAMIDOL (ISOVUE-300) INJECTION 61%
INTRAVENOUS | Status: AC
  Administered 2018-01-27: 20 mL
  Filled 2018-01-27: qty 50

## 2018-01-27 MED ORDER — LIDOCAINE HCL 1 % IJ SOLN
INTRAMUSCULAR | Status: AC
  Filled 2018-01-27: qty 20

## 2018-01-27 MED ORDER — LIDOCAINE VISCOUS 2 % MT SOLN
OROMUCOSAL | Status: AC
  Filled 2018-01-27: qty 15

## 2018-01-27 NOTE — Progress Notes (Signed)
Referring Physician(s): Dr Deland Pretty  Supervising Physician: Jacqulynn Cadet  Patient Status:  [facility] OP  Chief Complaint:  Diverticular abscess drain leakage  Subjective:  12/02/17: FINDINGS: Small gas and fluid collection persists just above the urinary bladder, compatible with ongoing pelvic abscess. Pigtail catheter placed into the abscess drained approximately 20 cc of purulent material. IMPRESSION: Status post CT-guided drainage of pelvic abscess.  Pt is scheduled for diverticular surgery with Dr Dema Severin 02/17/18  Dr Dema Severin note 12/29/17: Impression: Mr. 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 Cathie Olden - timing of surgery based on this - ideally in the next month -Once above cleared/completed, will plan laparoscopic vs open sigmoid colectomy; possible ostomy; flexible sigmoidoscopy; cysto/stents (urology); all other indicated procedures. -The anatomy and physiology of the GI tract was again discussed at length with the patient. The pathophysiology of diverticulitis was discussed at length with associated pictures and illustrations. -The planned procedure, material risks (including, but not limited to, pain, bleeding, infection, scarring, need for blood transfusion, damage to surrounding structures- blood vessels/nerves/viscus/organs, damage to ureter, urine leak, leak from anastomosis, need for additional procedures, need for stoma which may be permanent, hernia, recurrence, pneumonia, heart attack, stroke, death) benefits and alternatives to surgery were discussed at length. I noted a good probability that the procedure would help improve his symptoms. His questions were answered to his  satisfaction and he elected to proceed with surgery. Signed electronically by Ileana Roup, MD (12/29/2017 10:10 AM)  Here in IR today with painful drain catheter; reddened skin at site Stat lock loose Asking for help to get through to surgery date      Allergies: Aspirin and Crestor [rosuvastatin calcium]  Medications: Prior to Admission medications   Medication Sig Start Date End Date Taking? Authorizing Provider  acetaminophen (TYLENOL) 325 MG tablet Take 325 mg every 6 (six) hours as needed by mouth for mild pain.    [provider]  amoxicillin-clavulanate (AUGMENTIN) 875-125 MG tablet Take 1 tablet by mouth 2 (two) times daily.    [provider]  aspirin EC 81 MG EC tablet Take 1 tablet (81 mg total) by mouth daily. 11/06/17   Nani Skillern, PA-C  carvedilol (COREG) 3.125 MG tablet Take 1 tablet (3.125 mg total) by mouth 2 (two) times daily. 12/12/17 04/11/18  Leanor Kail, PA  Cholecalciferol (VITAMIN D3) 5000 units CAPS Take 5,000 Units daily by mouth.     [provider]  colchicine 0.6 MG tablet Take 0.6 mg by mouth daily as needed (gout).     [provider]  febuxostat (ULORIC) 40 MG tablet Take 40 mg by mouth daily.    [provider]  furosemide (LASIX) 20 MG tablet TAKE 1 TABLET(20 MG) BY MOUTH DAILY AS NEEDED FOR FLUID RETENTION OR SWELLING 12/23/17   Bhagat, Bhavinkumar, PA  hydrocortisone cream 1 % Apply 1 application topically as needed for itching. Do NOT apply to sternal wound 11/06/17   Lars Pinks M, PA-C  warfarin (COUMADIN) 1 MG tablet Take 1 tablet (1 mg total) by mouth daily at 6 PM. 12/19/17   Elgie Collard, PA-C     Vital Signs: There were no vitals taken for this visit.  Physical Exam  Constitutional: He is oriented to person, place, and time.  Abdominal: Soft. There is tenderness.  Musculoskeletal: Normal range of motion.  Neurological: He is alert and oriented to person, place, and  time.  Skin: Skin is warm and dry. There is erythema.  reddened skin at drain site Bag intact OP minimal-- pus like  Psychiatric: He has a normal mood and affect. His behavior is normal.  Nursing note and vitals reviewed.   Imaging: No results found.  Labs:  CBC: Recent Labs    12/01/17 0713 12/02/17 0626 12/03/17 0530 12/04/17 1038  WBC 6.4 5.1 3.5* 5.7  HGB 9.8* 9.2* 9.3* 11.3*  HCT 31.2* 28.5* 29.2* 35.9*  PLT 263 231 263 282    COAGS: Recent Labs    10/25/17 0341 10/26/17 1517  12/01/17 1520 12/02/17 0626 12/03/17 0530 12/04/17 0505  INR 1.16 1.46   < > 1.39 1.30 1.29 1.44  APTT 77* 39*  --   --   --   --   --    < > = values in this interval not displayed.    BMP: Recent Labs    12/02/17 0626 12/03/17 0530 12/04/17 0505 12/22/17 1225  NA 134* 136 136 139  K 3.3* 3.3* 3.9 4.6  CL 102 101 105 102  CO2 22 24 23 22   GLUCOSE 88 95 86 93  BUN 6 7 6 18   CALCIUM 8.6* 8.5* 8.7* 8.9  CREATININE 1.11 1.00 0.97 1.29*  GFRNONAA >60 >60 >60 56*  GFRAA >60 >60 >60 65    LIVER FUNCTION TESTS: Recent Labs    10/07/17 1541 10/22/17 0838 11/26/17 0812  BILITOT 0.5 0.6 0.7  AST 17 19 16   ALT 10* 7* 11*  ALKPHOS 77 58 75  PROT 7.4 7.1 7.0  ALBUMIN 3.2* 3.1* 2.8*    Assessment and Plan:  diverticular abscess-- colovesicular fistula Now for drain injection-- possible exchange   Electronically Signed: Alayiah Fontes A, PA-C 01/27/2018, 3:17 PM   I spent a total of 15 Minutes at the the patient's bedside AND on the patient's hospital floor or unit, greater than 50% of which was counseling/coordinating care for drain injection

## 2018-02-09 ENCOUNTER — Encounter (HOSPITAL_COMMUNITY): Payer: Self-pay

## 2018-02-09 NOTE — Patient Instructions (Signed)
Your procedure is scheduled on: Friday, February 17, 2018   Surgery Time:  7:30AM-11:00AM   Report to [facility] Main  Entrance   Arrive by 5:30 AM pick up the phone at the front desk dial [phone], have a seat in the lobby and a staff member will come and escort you to Short Stay Department   Call this number if you have problems the morning of surgery [phone]   Do not eat food or drink liquids :After Midnight.   Do NOT smoke after Midnight   Complete one Ensure drink the morning of surgery right before leaving the house for surgery.   Take these medicines the morning of surgery with A SIP OF WATER: Carvedilol                               You may not have any metal on your body including jewelry, and body piercings             Do not wear lotions, powders, perfumes/cologne, or deodorant             Do not wear nail polish.  Do not shave  48 hours prior to surgery.                Do not bring valuables to the hospital. [place].   Contacts, dentures or bridgework may not be worn into surgery.   Leave suitcase in the car. After surgery it may be brought to your room.     Special Instructions: Bring a copy of your healthcare power of attorney and living will documents         the day of surgery if you haven't scanned them in before.              Please read over the following fact sheets you were given:  [facility] - Preparing for Surgery Before surgery, you can play an important role.  Because skin is not sterile, your skin needs to be as free of germs as possible.  You can reduce the number of germs on your skin by washing with CHG (chlorahexidine gluconate) soap before surgery.  CHG is an antiseptic cleaner which kills germs and bonds with the skin to continue killing germs even after washing. Please DO NOT use if you have an allergy to CHG or antibacterial soaps.  If your skin becomes reddened/irritated stop  using the CHG and inform your nurse when you arrive at Short Stay. Do not shave (including legs and underarms) for at least 48 hours prior to the first CHG shower.  You may shave your face/neck.  Please follow these instructions carefully:  1.  Shower with CHG Soap the night before surgery and the  morning of surgery.  2.  If you choose to wash your hair, wash your hair first as usual with your normal  shampoo.  3.  After you shampoo, rinse your hair and body thoroughly to remove the shampoo.                             4.  Use CHG as you would any other liquid soap.  You can apply chg directly to the skin and wash.  Gently with a scrungie or clean washcloth.  5.  Apply the CHG Soap to your body ONLY FROM THE NECK DOWN.   Do   not use on face/ open                           Wound or open sores. Avoid contact with eyes, ears mouth and   genitals (private parts).                       Wash face,  Genitals (private parts) with your normal soap.             6.  Wash thoroughly, paying special attention to the area where your    surgery  will be performed.  7.  Thoroughly rinse your body with warm water from the neck down.  8.  DO NOT shower/wash with your normal soap after using and rinsing off the CHG Soap.                9.  Pat yourself dry with a clean towel.            10.  Wear clean pajamas.            11.  Place clean sheets on your bed the night of your first shower and do not  sleep with pets. Day of Surgery : Do not apply any lotions/deodorants the morning of surgery.  Please wear clean clothes to the hospital/surgery center.  FAILURE TO FOLLOW THESE INSTRUCTIONS MAY RESULT IN THE CANCELLATION OF YOUR SURGERY  PATIENT SIGNATURE_________________________________  NURSE SIGNATURE__________________________________  ________________________________________________________________________   Adam Phenix  An incentive spirometer is a tool that can help keep your lungs clear and  active. This tool measures how well you are filling your lungs with each breath. Taking long deep breaths may help reverse or decrease the chance of developing breathing (pulmonary) problems (especially infection) following:  A long period of time when you are unable to move or be active. BEFORE THE PROCEDURE   If the spirometer includes an indicator to show your best effort, your nurse or respiratory therapist will set it to a desired goal.  If possible, sit up straight or lean slightly forward. Try not to slouch.  Hold the incentive spirometer in an upright position. INSTRUCTIONS FOR USE  1. Sit on the edge of your bed if possible, or sit up as far as you can in bed or on a chair. 2. Hold the incentive spirometer in an upright position. 3. Breathe out normally. 4. Place the mouthpiece in your mouth and seal your lips tightly around it. 5. Breathe in slowly and as deeply as possible, raising the piston or the ball toward the top of the column. 6. Hold your breath for 3-5 seconds or for as long as possible. Allow the piston or ball to fall to the bottom of the column. 7. Remove the mouthpiece from your mouth and breathe out normally. 8. Rest for a few seconds and repeat Steps 1 through 7 at least 10 times every 1-2 hours when you are awake. Take your time and take a few normal breaths between deep breaths. 9. The spirometer may include an indicator to show your best effort. Use the indicator as a goal to work toward during each repetition. 10. After each set of 10 deep breaths, practice coughing to be sure your lungs are clear. If you have an incision (the cut made at the time of surgery), support  your incision when coughing by placing a pillow or rolled up towels firmly against it. Once you are able to get out of bed, walk around indoors and cough well. You may stop using the incentive spirometer when instructed by your caregiver.  RISKS AND COMPLICATIONS  Take your time so you do not get  dizzy or light-headed.  If you are in pain, you may need to take or ask for pain medication before doing incentive spirometry. It is harder to take a deep breath if you are having pain. AFTER USE  Rest and breathe slowly and easily.  It can be helpful to keep track of a log of your progress. Your caregiver can provide you with a simple table to help with this. If you are using the spirometer at home, follow these instructions: [place] IF:   You are having difficultly using the spirometer.  You have trouble using the spirometer as often as instructed.  Your pain medication is not giving enough relief while using the spirometer.  You develop fever of 100.5 F (38.1 C) or higher. SEEK IMMEDIATE MEDICAL CARE IF:   You cough up bloody sputum that had not been present before.  You develop fever of 102 F (38.9 C) or greater.  You develop worsening pain at or near the incision site. MAKE SURE YOU:   Understand these instructions.  Will watch your condition.  Will get help right away if you are not doing well or get worse. Document Released: 04/04/2007 Document Revised: 02/14/2012 Document Reviewed: 06/05/2007 [facility] Patient Information 2014 [place], [place].   ________________________________________________________________________

## 2018-02-09 NOTE — Pre-Procedure Instructions (Signed)
The following are in epic: Cardiac Clearance Dr. Acie Fredrickson 01/22/2018 EKG 12/22/2017 Last office visit note Dr. Curly [place] 12/22/2017 ECHO 10/26/2017

## 2018-02-10 ENCOUNTER — Other Ambulatory Visit: Payer: Self-pay

## 2018-02-10 ENCOUNTER — Encounter (HOSPITAL_COMMUNITY): Payer: Self-pay

## 2018-02-10 ENCOUNTER — Encounter (HOSPITAL_COMMUNITY)
Admission: RE | Admit: 2018-02-10 | Discharge: 2018-02-10 | Disposition: A | Discharge status: Home / Self Care | Payer: Medicare Other | Source: Ambulatory Visit | Attending: Surgery | Admitting: Surgery

## 2018-02-10 DIAGNOSIS — Z01818 Encounter for other preprocedural examination: Secondary | ICD-10-CM | POA: Insufficient documentation

## 2018-02-10 DIAGNOSIS — K5792 Diverticulitis of intestine, part unspecified, without perforation or abscess without bleeding: Secondary | ICD-10-CM | POA: Insufficient documentation

## 2018-02-10 HISTORY — DX: Cardiac arrhythmia, unspecified: I49.9

## 2018-02-10 HISTORY — DX: Anemia, unspecified: D64.9

## 2018-02-10 HISTORY — DX: Ischemic cardiomyopathy: I25.5

## 2018-02-10 HISTORY — DX: Peritoneal abscess: K65.1

## 2018-02-10 HISTORY — DX: Personal history of urinary calculi: Z87.442

## 2018-02-10 LAB — BASIC METABOLIC PANEL
ANION GAP: 9 (ref 5–15)
BUN: 14 mg/dL (ref 6–20)
CO2: 27 mmol/L (ref 22–32)
Calcium: 9.3 mg/dL (ref 8.9–10.3)
Chloride: 102 mmol/L (ref 101–111)
Creatinine, Ser: 1.18 mg/dL (ref 0.61–1.24)
GFR calc Af Amer: >60 mL/min (ref >60)
GLUCOSE: 107 mg/dL — AB (ref 65–99)
POTASSIUM: 4.2 mmol/L (ref 3.5–5.1)
Sodium: 138 mmol/L (ref 135–145)

## 2018-02-10 LAB — CBC
HEMATOCRIT: 37.4 % — AB (ref 39.0–52.0)
HEMOGLOBIN: 11.8 g/dL — AB (ref 13.0–17.0)
MCH: 29.4 pg (ref 26.0–34.0)
MCHC: 31.6 g/dL (ref 30.0–36.0)
MCV: 93 fL (ref 78.0–100.0)
Platelets: 234 10*3/uL (ref 150–400)
RBC: 4.02 MIL/uL — ABNORMAL LOW (ref 4.22–5.81)
RDW: 15.4 % (ref 11.5–15.5)
WBC: 5.2 10*3/uL (ref 4.0–10.5)

## 2018-02-10 LAB — PROTIME-INR
INR: 2
Prothrombin Time: 22.6 seconds — ABNORMAL HIGH (ref 11.4–15.2)

## 2018-02-10 LAB — TYPE AND SCREEN
ABO/RH(D): A POS
ANTIBODY SCREEN: NEGATIVE

## 2018-02-10 LAB — APTT: aPTT: 52 seconds — ABNORMAL HIGH (ref 24–36)

## 2018-02-10 LAB — ABO/RH: ABO/RH(D): A POS

## 2018-02-10 NOTE — Consult Note (Signed)
[place] Nurse requested for preoperative stoma site marking  Discussed surgical procedure and stoma creation with patient and family.  Explained role of the [place] nurse team.  Provided the patient with educational booklet and provided samples of pouching options.  Answered patient and family questions.   Examined patient lying, sitting, and standing in order to place the marking in the patient's visual field, away from any creases or abdominal contour issues and within the rectus muscle.  Attempted to mark below the patient's belt line.   Marked for colostomy in the LLQ  4  cm to the left of the umbilicus and  3 cm below the umbilicus.     Patient's abdomen cleansed with CHG wipes at site markings, allowed to air dry prior to marking.Covered mark with thin film transparent dressing to preserve mark until date of surgery.   [place] Nurse team will follow up with patient after surgery for continue ostomy care and teaching.    Domenic Moras RN BSN [place] Pager [phone]

## 2018-02-10 NOTE — Pre-Procedure Instructions (Signed)
Dr. Fransisco Beau made aware of PTT 52 and PT 22.6 ordered to redraw PTT and PT morning of surgery.

## 2018-02-10 NOTE — Pre-Procedure Instructions (Signed)
CBC, BMP, PT, and PTT results 02/10/2018 faxed to Dr. Dema Severin via epic.

## 2018-02-16 NOTE — Anesthesia Preprocedure Evaluation (Addendum)
Anesthesia Evaluation  Patient identified by MRN, date of birth, ID band Patient awake    Reviewed: Allergy & Precautions, NPO status , Patient's Chart, lab work & pertinent test results  Airway Mallampati: II  TM Distance: >3 FB Neck ROM: Full    Dental  (+) Dental Advisory Given   Pulmonary former smoker,    breath sounds clear to auscultation       Cardiovascular hypertension, Pt. on medications and Pt. on home beta blockers + CAD, + Past MI and + CABG  + dysrhythmias Atrial Fibrillation  Rhythm:Regular Rate:Normal  EF 30% by last Echo. S/p CABG   Neuro/Psych negative neurological ROS     GI/Hepatic Neg liver ROS, GERD  ,  Endo/Other    Renal/GU negative Renal ROS     Musculoskeletal   Abdominal   Peds  Hematology   Anesthesia Other Findings   Reproductive/Obstetrics                           Lab Results  Component Value Date   WBC 5.2 02/10/2018   HGB 11.8 (L) 02/10/2018   HCT 37.4 (L) 02/10/2018   MCV 93.0 02/10/2018   PLT 234 02/10/2018   Lab Results  Component Value Date   CREATININE 1.18 02/10/2018   BUN 14 02/10/2018   NA 138 02/10/2018   K 4.2 02/10/2018   CL 102 02/10/2018   CO2 27 02/10/2018   Lab Results  Component Value Date   INR 1.79 02/17/2018   INR 2.00 02/10/2018   INR 1.44 12/04/2017    Anesthesia Physical Anesthesia Plan  ASA: IV  Anesthesia Plan: General   Post-op Pain Management:    Induction: Intravenous  PONV Risk Score and Plan: 4 or greater and Dexamethasone, Ondansetron, Treatment may vary due to age or medical condition and Scopolamine patch - Pre-op  Airway Management Planned: Oral ETT  Additional Equipment: Arterial line  Intra-op Plan:   Post-operative Plan: Extubation in OR  Informed Consent: I have reviewed the patients History and Physical, chart, labs and discussed the procedure including the risks, benefits and  alternatives for the proposed anesthesia with the patient or authorized representative who has indicated his/her understanding and acceptance.   Dental advisory given  Plan Discussed with: CRNA  Anesthesia Plan Comments:       Anesthesia Quick Evaluation

## 2018-02-17 ENCOUNTER — Inpatient Hospital Stay (HOSPITAL_COMMUNITY): Payer: Medicare Other

## 2018-02-17 ENCOUNTER — Inpatient Hospital Stay (HOSPITAL_COMMUNITY): Payer: Medicare Other | Admitting: Anesthesiology

## 2018-02-17 ENCOUNTER — Encounter (HOSPITAL_COMMUNITY)
Admission: RE | Disposition: A | Discharge status: Home / Self Care | Payer: Self-pay | Source: Ambulatory Visit | Attending: Surgery

## 2018-02-17 ENCOUNTER — Inpatient Hospital Stay (HOSPITAL_COMMUNITY)
Admission: RE | Admit: 2018-02-17 | Discharge: 2018-02-22 | DRG: 653 | Disposition: A | Discharge status: Home / Self Care | Payer: Medicare Other | Source: Ambulatory Visit | Attending: Surgery | Admitting: Surgery

## 2018-02-17 ENCOUNTER — Other Ambulatory Visit: Payer: Self-pay

## 2018-02-17 ENCOUNTER — Encounter (HOSPITAL_COMMUNITY): Payer: Self-pay | Admitting: Emergency Medicine

## 2018-02-17 DIAGNOSIS — I252 Old myocardial infarction: Secondary | ICD-10-CM

## 2018-02-17 DIAGNOSIS — K651 Peritoneal abscess: Secondary | ICD-10-CM | POA: Diagnosis present

## 2018-02-17 DIAGNOSIS — E869 Volume depletion, unspecified: Secondary | ICD-10-CM | POA: Diagnosis present

## 2018-02-17 DIAGNOSIS — D62 Acute posthemorrhagic anemia: Secondary | ICD-10-CM | POA: Diagnosis not present

## 2018-02-17 DIAGNOSIS — I255 Ischemic cardiomyopathy: Secondary | ICD-10-CM | POA: Diagnosis present

## 2018-02-17 DIAGNOSIS — Z7901 Long term (current) use of anticoagulants: Secondary | ICD-10-CM | POA: Diagnosis not present

## 2018-02-17 DIAGNOSIS — Z951 Presence of aortocoronary bypass graft: Secondary | ICD-10-CM | POA: Diagnosis not present

## 2018-02-17 DIAGNOSIS — N321 Vesicointestinal fistula: Secondary | ICD-10-CM | POA: Diagnosis present

## 2018-02-17 DIAGNOSIS — K219 Gastro-esophageal reflux disease without esophagitis: Secondary | ICD-10-CM | POA: Diagnosis present

## 2018-02-17 DIAGNOSIS — I480 Paroxysmal atrial fibrillation: Secondary | ICD-10-CM | POA: Diagnosis present

## 2018-02-17 DIAGNOSIS — N179 Acute kidney failure, unspecified: Secondary | ICD-10-CM | POA: Diagnosis present

## 2018-02-17 DIAGNOSIS — Z87891 Personal history of nicotine dependence: Secondary | ICD-10-CM

## 2018-02-17 DIAGNOSIS — I2510 Atherosclerotic heart disease of native coronary artery without angina pectoris: Secondary | ICD-10-CM | POA: Diagnosis present

## 2018-02-17 DIAGNOSIS — K5720 Diverticulitis of large intestine with perforation and abscess without bleeding: Secondary | ICD-10-CM | POA: Diagnosis present

## 2018-02-17 DIAGNOSIS — Z79899 Other long term (current) drug therapy: Secondary | ICD-10-CM

## 2018-02-17 DIAGNOSIS — Z9049 Acquired absence of other specified parts of digestive tract: Secondary | ICD-10-CM

## 2018-02-17 DIAGNOSIS — K632 Fistula of intestine: Secondary | ICD-10-CM | POA: Diagnosis present

## 2018-02-17 DIAGNOSIS — I10 Essential (primary) hypertension: Secondary | ICD-10-CM | POA: Diagnosis present

## 2018-02-17 DIAGNOSIS — K5792 Diverticulitis of intestine, part unspecified, without perforation or abscess without bleeding: Secondary | ICD-10-CM | POA: Diagnosis present

## 2018-02-17 DIAGNOSIS — M109 Gout, unspecified: Secondary | ICD-10-CM | POA: Diagnosis present

## 2018-02-17 DIAGNOSIS — I4891 Unspecified atrial fibrillation: Secondary | ICD-10-CM | POA: Diagnosis present

## 2018-02-17 HISTORY — PX: CYSTOSCOPY W/ URETERAL STENT PLACEMENT: SHX1429

## 2018-02-17 HISTORY — PX: BLADDER REPAIR: SHX6721

## 2018-02-17 HISTORY — PX: SIGMOIDOSCOPY: SHX6686

## 2018-02-17 HISTORY — PX: LAPAROSCOPIC SIGMOID COLECTOMY: SHX5928

## 2018-02-17 LAB — MRSA PCR SCREENING: MRSA BY PCR: NEGATIVE

## 2018-02-17 LAB — POCT I-STAT 7, (LYTES, BLD GAS, ICA,H+H)
ACID-BASE DEFICIT: 1 mmol/L (ref 0.0–2.0)
Bicarbonate: 24.8 mmol/L (ref 20.0–28.0)
Calcium, Ion: 1.1 mmol/L — ABNORMAL LOW (ref 1.15–1.40)
HCT: 27 % — ABNORMAL LOW (ref 39.0–52.0)
HEMOGLOBIN: 9.2 g/dL — AB (ref 13.0–17.0)
O2 Saturation: 100 %
PH ART: 7.355 (ref 7.350–7.450)
PO2 ART: 263 mmHg — AB (ref 83.0–108.0)
Potassium: 3.3 mmol/L — ABNORMAL LOW (ref 3.5–5.1)
Sodium: 138 mmol/L (ref 135–145)
TCO2: 26 mmol/L (ref 22–32)
pCO2 arterial: 44.4 mmHg (ref 32.0–48.0)

## 2018-02-17 LAB — APTT: APTT: 42 s — AB (ref 24–36)

## 2018-02-17 LAB — PROTIME-INR
INR: 1.79
INR: 1.85
PROTHROMBIN TIME: 20.7 s — AB (ref 11.4–15.2)
PROTHROMBIN TIME: 21.2 s — AB (ref 11.4–15.2)

## 2018-02-17 LAB — CBC
HCT: 28.9 % — ABNORMAL LOW (ref 39.0–52.0)
Hemoglobin: 9.5 g/dL — ABNORMAL LOW (ref 13.0–17.0)
MCH: 29.5 pg (ref 26.0–34.0)
MCHC: 32.9 g/dL (ref 30.0–36.0)
MCV: 89.8 fL (ref 78.0–100.0)
Platelets: 161 10*3/uL (ref 150–400)
RBC: 3.22 MIL/uL — AB (ref 4.22–5.81)
RDW: 14.8 % (ref 11.5–15.5)
WBC: 6.1 10*3/uL (ref 4.0–10.5)

## 2018-02-17 LAB — CREATININE, SERUM
CREATININE: 1.26 mg/dL — AB (ref 0.61–1.24)
GFR calc non Af Amer: 57 mL/min — ABNORMAL LOW (ref >60)

## 2018-02-17 SURGERY — COLECTOMY, SIGMOID, LAPAROSCOPIC
Anesthesia: General

## 2018-02-17 MED ORDER — LIDOCAINE HCL 2 % IJ SOLN
INTRAMUSCULAR | Status: AC
  Filled 2018-02-17: qty 20

## 2018-02-17 MED ORDER — CHLORHEXIDINE GLUCONATE CLOTH 2 % EX PADS: 6.0000 | MEDICATED_PAD | Freq: Once | CUTANEOUS | Status: DC

## 2018-02-17 MED ORDER — SUGAMMADEX SODIUM 200 MG/2ML IV SOLN
INTRAVENOUS | Status: DC | PRN
  Administered 2018-02-17: 200 mg via INTRAVENOUS

## 2018-02-17 MED ORDER — ASPIRIN EC 81 MG PO TBEC
81.0000 mg | DELAYED_RELEASE_TABLET | Freq: Every day | ORAL | Status: DC
  Administered 2018-02-18 – 2018-02-22 (×5): 81 mg via ORAL
  Filled 2018-02-17 (×5): qty 1

## 2018-02-17 MED ORDER — MIDAZOLAM HCL 2 MG/2ML IJ SOLN
INTRAMUSCULAR | Status: AC
  Filled 2018-02-17: qty 2

## 2018-02-17 MED ORDER — SUGAMMADEX SODIUM 200 MG/2ML IV SOLN
INTRAVENOUS | Status: AC
  Filled 2018-02-17: qty 2

## 2018-02-17 MED ORDER — SCOPOLAMINE 1 MG/3DAYS TD PT72
MEDICATED_PATCH | TRANSDERMAL | Status: AC
  Filled 2018-02-17: qty 1

## 2018-02-17 MED ORDER — SODIUM CHLORIDE 0.9 % IV SOLN
INTRAVENOUS | Status: DC | PRN
  Administered 2018-02-17: 08:00:00 via INTRAVENOUS

## 2018-02-17 MED ORDER — ALVIMOPAN 12 MG PO CAPS
12.0000 mg | ORAL_CAPSULE | ORAL | Status: AC
  Administered 2018-02-17: 12 mg via ORAL
  Filled 2018-02-17: qty 1

## 2018-02-17 MED ORDER — EPHEDRINE SULFATE-NACL 50-0.9 MG/10ML-% IV SOSY
PREFILLED_SYRINGE | INTRAVENOUS | Status: DC | PRN
  Administered 2018-02-17: 10 mg via INTRAVENOUS
  Administered 2018-02-17: 15 mg via INTRAVENOUS
  Administered 2018-02-17 (×4): 10 mg via INTRAVENOUS

## 2018-02-17 MED ORDER — BUPIVACAINE-EPINEPHRINE (PF) 0.5% -1:200000 IJ SOLN
INTRAMUSCULAR | Status: AC
  Filled 2018-02-17: qty 30

## 2018-02-17 MED ORDER — SODIUM CHLORIDE 0.9 % IJ SOLN
INTRAMUSCULAR | Status: AC
  Filled 2018-02-17: qty 50

## 2018-02-17 MED ORDER — MIDAZOLAM HCL 5 MG/5ML IJ SOLN
INTRAMUSCULAR | Status: DC | PRN
  Administered 2018-02-17: 2 mg via INTRAVENOUS

## 2018-02-17 MED ORDER — KETAMINE HCL 10 MG/ML IJ SOLN
INTRAMUSCULAR | Status: AC
  Filled 2018-02-17: qty 1

## 2018-02-17 MED ORDER — LIDOCAINE 2% (20 MG/ML) 5 ML SYRINGE
INTRAMUSCULAR | Status: DC | PRN
  Administered 2018-02-17: 100 mg via INTRAVENOUS

## 2018-02-17 MED ORDER — LIDOCAINE 20MG/ML (2%) 15 ML SYRINGE OPTIME
INTRAMUSCULAR | Status: DC | PRN
  Administered 2018-02-17: 1 mg/kg/h via INTRAVENOUS

## 2018-02-17 MED ORDER — DEXAMETHASONE SODIUM PHOSPHATE 10 MG/ML IJ SOLN
INTRAMUSCULAR | Status: AC
  Filled 2018-02-17: qty 1

## 2018-02-17 MED ORDER — OXYCODONE HCL 5 MG PO TABS
5.0000 mg | ORAL_TABLET | Freq: Four times a day (QID) | ORAL | Status: DC | PRN
  Administered 2018-02-17: 5 mg via ORAL
  Filled 2018-02-17: qty 1

## 2018-02-17 MED ORDER — SUCCINYLCHOLINE CHLORIDE 200 MG/10ML IV SOSY
PREFILLED_SYRINGE | INTRAVENOUS | Status: DC | PRN
  Administered 2018-02-17: 120 mg via INTRAVENOUS

## 2018-02-17 MED ORDER — HYDROMORPHONE HCL 1 MG/ML IJ SOLN: 0.5000 mg | INTRAMUSCULAR | Status: DC | PRN

## 2018-02-17 MED ORDER — ROCURONIUM BROMIDE 10 MG/ML (PF) SYRINGE
PREFILLED_SYRINGE | INTRAVENOUS | Status: DC | PRN
  Administered 2018-02-17: 10 mg via INTRAVENOUS
  Administered 2018-02-17: 20 mg via INTRAVENOUS
  Administered 2018-02-17: 50 mg via INTRAVENOUS

## 2018-02-17 MED ORDER — ONDANSETRON HCL 4 MG/2ML IJ SOLN
INTRAMUSCULAR | Status: DC | PRN
  Administered 2018-02-17 (×2): 4 mg via INTRAVENOUS

## 2018-02-17 MED ORDER — ACETAMINOPHEN 500 MG PO TABS
1000.0000 mg | ORAL_TABLET | ORAL | Status: AC
  Administered 2018-02-17: 1000 mg via ORAL
  Filled 2018-02-17: qty 2

## 2018-02-17 MED ORDER — FENTANYL CITRATE (PF) 100 MCG/2ML IJ SOLN
INTRAMUSCULAR | Status: AC
  Filled 2018-02-17: qty 2

## 2018-02-17 MED ORDER — METOPROLOL TARTRATE 5 MG/5ML IV SOLN: 5.0000 mg | Freq: Four times a day (QID) | INTRAVENOUS | Status: DC | PRN

## 2018-02-17 MED ORDER — DEXAMETHASONE SODIUM PHOSPHATE 10 MG/ML IJ SOLN
INTRAMUSCULAR | Status: DC | PRN
  Administered 2018-02-17: 10 mg via INTRAVENOUS

## 2018-02-17 MED ORDER — BUPIVACAINE LIPOSOME 1.3 % IJ SUSP
20.0000 mL | Freq: Once | INTRAMUSCULAR | Status: AC
  Administered 2018-02-17: 20 mL
  Filled 2018-02-17: qty 20

## 2018-02-17 MED ORDER — SODIUM CHLORIDE 0.9 % IR SOLN
Status: DC | PRN
  Administered 2018-02-17: 1000 mL

## 2018-02-17 MED ORDER — GABAPENTIN 300 MG PO CAPS
300.0000 mg | ORAL_CAPSULE | ORAL | Status: AC
  Administered 2018-02-17: 300 mg via ORAL
  Filled 2018-02-17: qty 1

## 2018-02-17 MED ORDER — SODIUM CHLORIDE 0.9 % IV SOLN: Freq: Once | INTRAVENOUS | Status: DC

## 2018-02-17 MED ORDER — GLYCOPYRROLATE 0.2 MG/ML IV SOSY
PREFILLED_SYRINGE | INTRAVENOUS | Status: DC | PRN
  Administered 2018-02-17: 0.4 mg via INTRAVENOUS

## 2018-02-17 MED ORDER — METRONIDAZOLE 500 MG PO TABS
1000.0000 mg | ORAL_TABLET | ORAL | Status: DC
  Filled 2018-02-17: qty 2

## 2018-02-17 MED ORDER — GABAPENTIN 300 MG PO CAPS
300.0000 mg | ORAL_CAPSULE | Freq: Three times a day (TID) | ORAL | Status: DC
  Administered 2018-02-17 – 2018-02-18 (×3): 300 mg via ORAL
  Filled 2018-02-17 (×10): qty 1

## 2018-02-17 MED ORDER — ALUM & MAG HYDROXIDE-SIMETH 200-200-20 MG/5ML PO SUSP: 30.0000 mL | Freq: Four times a day (QID) | ORAL | Status: DC | PRN

## 2018-02-17 MED ORDER — HEPARIN SODIUM (PORCINE) 5000 UNIT/ML IJ SOLN
5000.0000 [IU] | Freq: Three times a day (TID) | INTRAMUSCULAR | Status: DC
  Administered 2018-02-17 – 2018-02-19 (×6): 5000 [IU] via SUBCUTANEOUS
  Filled 2018-02-17 (×6): qty 1

## 2018-02-17 MED ORDER — NEOMYCIN SULFATE 500 MG PO TABS
1000.0000 mg | ORAL_TABLET | ORAL | Status: DC
  Filled 2018-02-17: qty 2

## 2018-02-17 MED ORDER — BUPIVACAINE-EPINEPHRINE (PF) 0.5% -1:200000 IJ SOLN
INTRAMUSCULAR | Status: DC | PRN
  Administered 2018-02-17: 15 mL

## 2018-02-17 MED ORDER — LIP MEDEX EX OINT
TOPICAL_OINTMENT | CUTANEOUS | Status: AC
  Filled 2018-02-17: qty 7

## 2018-02-17 MED ORDER — SODIUM CHLORIDE 0.9 % IJ SOLN
INTRAMUSCULAR | Status: DC | PRN
  Administered 2018-02-17: 15 mL via INTRAVENOUS

## 2018-02-17 MED ORDER — ALVIMOPAN 12 MG PO CAPS
12.0000 mg | ORAL_CAPSULE | Freq: Two times a day (BID) | ORAL | Status: DC
  Administered 2018-02-18 – 2018-02-19 (×3): 12 mg via ORAL
  Filled 2018-02-17 (×4): qty 1

## 2018-02-17 MED ORDER — LACTATED RINGERS IV SOLN
INTRAVENOUS | Status: DC | PRN
  Administered 2018-02-17 (×2): via INTRAVENOUS

## 2018-02-17 MED ORDER — IOHEXOL 300 MG/ML  SOLN
INTRAMUSCULAR | Status: DC | PRN
  Administered 2018-02-17: 10 mL via URETHRAL

## 2018-02-17 MED ORDER — FENTANYL CITRATE (PF) 100 MCG/2ML IJ SOLN
INTRAMUSCULAR | Status: DC | PRN
  Administered 2018-02-17 (×6): 50 ug via INTRAVENOUS

## 2018-02-17 MED ORDER — KETAMINE HCL 10 MG/ML IJ SOLN
INTRAMUSCULAR | Status: DC | PRN
  Administered 2018-02-17: 30 mg via INTRAVENOUS
  Administered 2018-02-17 (×2): 20 mg via INTRAVENOUS
  Administered 2018-02-17: 10 mg via INTRAVENOUS

## 2018-02-17 MED ORDER — CEFOTETAN DISODIUM-DEXTROSE 2-2.08 GM-%(50ML) IV SOLR
2.0000 g | INTRAVENOUS | Status: AC
  Administered 2018-02-17: 2 g via INTRAVENOUS
  Filled 2018-02-17: qty 50

## 2018-02-17 MED ORDER — DIPHENHYDRAMINE HCL 50 MG/ML IJ SOLN: 12.5000 mg | Freq: Four times a day (QID) | INTRAMUSCULAR | Status: DC | PRN

## 2018-02-17 MED ORDER — LIDOCAINE 2% (20 MG/ML) 5 ML SYRINGE
INTRAMUSCULAR | Status: AC
  Filled 2018-02-17: qty 5

## 2018-02-17 MED ORDER — ACETAMINOPHEN 325 MG PO TABS
650.0000 mg | ORAL_TABLET | Freq: Four times a day (QID) | ORAL | Status: DC
  Administered 2018-02-17: 650 mg via ORAL
  Administered 2018-02-17: 325 mg via ORAL
  Administered 2018-02-18 – 2018-02-22 (×18): 650 mg via ORAL
  Filled 2018-02-17 (×21): qty 2

## 2018-02-17 MED ORDER — SUCCINYLCHOLINE CHLORIDE 200 MG/10ML IV SOSY
PREFILLED_SYRINGE | INTRAVENOUS | Status: AC
  Filled 2018-02-17: qty 10

## 2018-02-17 MED ORDER — ONDANSETRON HCL 4 MG/2ML IJ SOLN: 4.0000 mg | Freq: Four times a day (QID) | INTRAMUSCULAR | Status: DC | PRN

## 2018-02-17 MED ORDER — FENTANYL CITRATE (PF) 100 MCG/2ML IJ SOLN: 25.0000 ug | INTRAMUSCULAR | Status: DC | PRN

## 2018-02-17 MED ORDER — CARVEDILOL 3.125 MG PO TABS
3.1250 mg | ORAL_TABLET | Freq: Two times a day (BID) | ORAL | Status: DC
  Administered 2018-02-17 – 2018-02-22 (×8): 3.125 mg via ORAL
  Filled 2018-02-17 (×8): qty 1

## 2018-02-17 MED ORDER — DEXTROSE 5 % IV SOLN
5.0000 mg | Freq: Once | INTRAVENOUS | Status: AC
  Administered 2018-02-17: 5 mg via INTRAVENOUS
  Filled 2018-02-17: qty 0.5

## 2018-02-17 MED ORDER — HEPARIN SODIUM (PORCINE) 5000 UNIT/ML IJ SOLN
5000.0000 [IU] | Freq: Once | INTRAMUSCULAR | Status: AC
  Administered 2018-02-17: 5000 [IU] via SUBCUTANEOUS
  Filled 2018-02-17: qty 1

## 2018-02-17 MED ORDER — ONDANSETRON HCL 4 MG PO TABS: 4.0000 mg | ORAL_TABLET | Freq: Four times a day (QID) | ORAL | Status: DC | PRN

## 2018-02-17 MED ORDER — ROCURONIUM BROMIDE 10 MG/ML (PF) SYRINGE
PREFILLED_SYRINGE | INTRAVENOUS | Status: AC
  Filled 2018-02-17: qty 5

## 2018-02-17 MED ORDER — DIPHENHYDRAMINE HCL 12.5 MG/5ML PO ELIX: 12.5000 mg | ORAL_SOLUTION | Freq: Four times a day (QID) | ORAL | Status: DC | PRN

## 2018-02-17 MED ORDER — LACTATED RINGERS IV SOLN
INTRAVENOUS | Status: DC | PRN
  Administered 2018-02-17 (×2): via INTRAVENOUS

## 2018-02-17 MED ORDER — IBUPROFEN 200 MG PO TABS
600.0000 mg | ORAL_TABLET | Freq: Four times a day (QID) | ORAL | Status: DC
  Filled 2018-02-17: qty 3

## 2018-02-17 MED ORDER — LACTATED RINGERS IV SOLN
INTRAVENOUS | Status: DC
  Administered 2018-02-17 – 2018-02-18 (×2): via INTRAVENOUS

## 2018-02-17 MED ORDER — PROPOFOL 10 MG/ML IV BOLUS
INTRAVENOUS | Status: DC | PRN
  Administered 2018-02-17: 150 mg via INTRAVENOUS

## 2018-02-17 MED ORDER — FEBUXOSTAT 40 MG PO TABS
40.0000 mg | ORAL_TABLET | Freq: Every day | ORAL | Status: DC
  Administered 2018-02-18 – 2018-02-21 (×4): 40 mg via ORAL
  Filled 2018-02-17 (×6): qty 1

## 2018-02-17 MED ORDER — EPHEDRINE 5 MG/ML INJ
INTRAVENOUS | Status: AC
  Filled 2018-02-17: qty 10

## 2018-02-17 MED ORDER — ONDANSETRON HCL 4 MG/2ML IJ SOLN
INTRAMUSCULAR | Status: AC
  Filled 2018-02-17: qty 2

## 2018-02-17 MED ORDER — POLYETHYLENE GLYCOL 3350 17 GM/SCOOP PO POWD
1.0000 | Freq: Once | ORAL | Status: DC
  Filled 2018-02-17: qty 255

## 2018-02-17 SURGICAL SUPPLY — 77 items
APPLIER CLIP ROT 10 11.4 M/L (STAPLE) ×5
BAG URO CATCHER STRL LF (MISCELLANEOUS) ×5 IMPLANT
BLADE CLIPPER SURG (BLADE) ×5 IMPLANT
CATH FOLEY 3WAY  5CC 18FR (CATHETERS) ×2
CATH FOLEY 3WAY 5CC 18FR (CATHETERS) ×3 IMPLANT
CATH INTERMIT  6FR 70CM (CATHETERS) ×10 IMPLANT
CATH URET 5FR 28IN OPEN ENDED (CATHETERS) ×10 IMPLANT
CHLORAPREP W/TINT 26ML (MISCELLANEOUS) ×5 IMPLANT
CLIP APPLIE ROT 10 11.4 M/L (STAPLE) ×3 IMPLANT
CLOTH BEACON ORANGE TIMEOUT ST (SAFETY) ×5 IMPLANT
COVER FOOTSWITCH UNIV (MISCELLANEOUS) IMPLANT
COVER MAYO STAND STRL (DRAPES) ×5 IMPLANT
COVER SURGICAL LIGHT HANDLE (MISCELLANEOUS) ×20 IMPLANT
DERMABOND ADVANCED (GAUZE/BANDAGES/DRESSINGS) ×2
DERMABOND ADVANCED .7 DNX12 (GAUZE/BANDAGES/DRESSINGS) ×3 IMPLANT
DRAIN CHANNEL 19F RND (DRAIN) ×5 IMPLANT
DRAPE UTILITY XL STRL (DRAPES) ×5 IMPLANT
DRAPE WARM FLUID 44X44 (DRAPE) ×5 IMPLANT
DRSG OPSITE POSTOP 4X10 (GAUZE/BANDAGES/DRESSINGS) IMPLANT
DRSG OPSITE POSTOP 4X6 (GAUZE/BANDAGES/DRESSINGS) ×5 IMPLANT
DRSG OPSITE POSTOP 4X8 (GAUZE/BANDAGES/DRESSINGS) IMPLANT
ELECT CAUTERY BLADE 6.4 (BLADE) ×10 IMPLANT
ELECT REM PT RETURN 15FT ADLT (MISCELLANEOUS) ×5 IMPLANT
EVACUATOR DRAINAGE 10X20 100CC (DRAIN) ×3 IMPLANT
EVACUATOR SILICONE 100CC (DRAIN) ×2
GAUZE SPONGE 2X2 8PLY STRL LF (GAUZE/BANDAGES/DRESSINGS) ×3 IMPLANT
GLOVE BIOGEL M STRL SZ7.5 (GLOVE) ×15 IMPLANT
GLOVE BIOGEL PI IND STRL 8 (GLOVE) ×6 IMPLANT
GLOVE BIOGEL PI INDICATOR 8 (GLOVE) ×4
GOWN STRL REUS W/ TWL LRG LVL3 (GOWN DISPOSABLE) ×18 IMPLANT
GOWN STRL REUS W/ TWL XL LVL3 (GOWN DISPOSABLE) ×6 IMPLANT
GOWN STRL REUS W/TWL LRG LVL3 (GOWN DISPOSABLE) ×22 IMPLANT
GOWN STRL REUS W/TWL XL LVL3 (GOWN DISPOSABLE) ×4
GUIDEWIRE STR DUAL SENSOR (WIRE) ×5 IMPLANT
KIT BASIN OR (CUSTOM PROCEDURE TRAY) ×5 IMPLANT
LEGGING LITHOTOMY PAIR STRL (DRAPES) IMPLANT
LIGASURE IMPACT 36 18CM CVD LR (INSTRUMENTS) ×5 IMPLANT
MANIFOLD NEPTUNE II (INSTRUMENTS) ×10 IMPLANT
NS IRRIG 1000ML POUR BTL (IV SOLUTION) ×10 IMPLANT
PACK COLON (CUSTOM PROCEDURE TRAY) ×10 IMPLANT
PACK CYSTO (CUSTOM PROCEDURE TRAY) ×10 IMPLANT
SCISSORS LAP 5X35 DISP (ENDOMECHANICALS) ×5 IMPLANT
SEALER TISSUE G2 STRG ARTC 35C (ENDOMECHANICALS) ×5 IMPLANT
SET IRRIG TUBING LAPAROSCOPIC (IRRIGATION / IRRIGATOR) IMPLANT
SHEARS HARMONIC ACE PLUS 36CM (ENDOMECHANICALS) ×5 IMPLANT
SLEEVE ADV FIXATION 5X100MM (TROCAR) ×5 IMPLANT
SLEEVE ENDOPATH XCEL 5M (ENDOMECHANICALS) ×5 IMPLANT
SPONGE GAUZE 2X2 STER 10/PKG (GAUZE/BANDAGES/DRESSINGS) ×2
SPONGE LAP 18X18 X RAY DECT (DISPOSABLE) ×5 IMPLANT
STAPLER CIRC CVD 29MM 37CM (STAPLE) ×5 IMPLANT
STAPLER CUT CVD 40MM GREEN (STAPLE) ×5 IMPLANT
STAPLER VISISTAT 35W (STAPLE) ×5 IMPLANT
SUCTION POOLE TIP (SUCTIONS) ×5 IMPLANT
SUT ETHILON 2 0 PSLX (SUTURE) ×5 IMPLANT
SUT MNCRL AB 4-0 PS2 18 (SUTURE) ×10 IMPLANT
SUT PDS AB 1 CT1 27 (SUTURE) ×10 IMPLANT
SUT PDS AB 1 TP1 96 (SUTURE) ×10 IMPLANT
SUT PROLENE 2 0 CT2 30 (SUTURE) IMPLANT
SUT PROLENE 2 0 KS (SUTURE) IMPLANT
SUT SILK 2 0 SH CR/8 (SUTURE) ×5 IMPLANT
SUT SILK 3 0 SH CR/8 (SUTURE) ×5 IMPLANT
SUT VIC AB 2-0 SH 18 (SUTURE) ×15 IMPLANT
SUT VIC AB 2-0 SH 27 (SUTURE) ×8
SUT VIC AB 2-0 SH 27X BRD (SUTURE) ×12 IMPLANT
SUT VICRYL 0 UR6 27IN ABS (SUTURE) ×5 IMPLANT
SYR BULB IRRIGATION 50ML (SYRINGE) ×5 IMPLANT
TOWEL OR 17X26 10 PK STRL BLUE (TOWEL DISPOSABLE) ×10 IMPLANT
TRAY FOLEY W/METER SILVER 16FR (SET/KITS/TRAYS/PACK) ×5 IMPLANT
TROCAR ADV FIXATION 5X100MM (TROCAR) ×5 IMPLANT
TROCAR BLADELESS OPT 5 100 (ENDOMECHANICALS) ×5 IMPLANT
TROCAR XCEL 12X100 BLDLESS (ENDOMECHANICALS) IMPLANT
TROCAR XCEL BLUNT TIP 100MML (ENDOMECHANICALS) IMPLANT
TROCAR XCEL NON-BLD 11X100MML (ENDOMECHANICALS) IMPLANT
TUBING CONNECTING 10 (TUBING) ×16 IMPLANT
TUBING CONNECTING 10' (TUBING) ×4
TUBING INSUF HEATED (TUBING) ×5 IMPLANT
YANKAUER SUCT BULB TIP NO VENT (SUCTIONS) ×10 IMPLANT

## 2018-02-17 NOTE — Anesthesia Procedure Notes (Signed)
Arterial Line Insertion Start/End3/15/2019 7:55 AM, 02/17/2018 8:01 AM Performed by: Suzette Battiest, MD, anesthesiologist  Patient location: Pre-op. Preanesthetic checklist: patient identified, IV checked, site marked, risks and benefits discussed, surgical consent, monitors and equipment checked, pre-op evaluation, timeout performed and anesthesia consent Lidocaine 1% used for infiltration Left, radial was placed Catheter size: 20 Fr Hand hygiene performed  and maximum sterile barriers used   Attempts: 2 Procedure performed without using ultrasound guided technique. Following insertion, dressing applied. Post procedure assessment: normal and unchanged  Post procedure complications: local hematoma. Patient tolerated the procedure well with no immediate complications.

## 2018-02-17 NOTE — Anesthesia Postprocedure Evaluation (Signed)
Anesthesia Post Note  Patient: Dean [name]  Procedure(s) Performed: LAPAROSCOPIC SIGMOID COLECTOMY  ERAS PATHWAY TAKEDOWN OF COLOVESSICAL COLOCUTANEOUS FISTULA (N/A ) FLEX SIGMOIDOSCOPY (N/A ) BLADDER REPAIR CYSTOSCOPY WITH RETROGRADE PYELOGRAM/URETERAL STENT PLACEMENT (Bilateral ) pelvic exploration, assist with bladder repair      Patient location during evaluation: PACU Anesthesia Type: General Level of consciousness: awake and alert Pain management: pain level controlled Vital Signs Assessment: post-procedure vital signs reviewed and stable Respiratory status: spontaneous breathing, nonlabored ventilation, respiratory function stable and patient connected to nasal cannula oxygen Cardiovascular status: blood pressure returned to baseline and stable Postop Assessment: no apparent nausea or vomiting Anesthetic complications: no    Last Vitals:  Vitals:   02/17/18 1545 02/17/18 1600  BP: 139/78 (!) 147/74  Pulse: 66 65  Resp: 11 17  Temp: (!) 35.8 C (!) 35.1 C  SpO2: 100% 100%    Last Pain:  Vitals:   02/17/18 1600  TempSrc: Core  PainSc:                  Tiajuana Amass

## 2018-02-17 NOTE — Op Note (Signed)
Preoperative diagnosis:  1. Diverticular abscess   Postoperative diagnosis:  1. Diverticular abscess 2. Colovesical fistual   Procedure: 1. Cystoscopy 2. Bilateral retrograde pyelogram with interpretation 3. Bilateral temporary ureteral stent placement  Surgeon: Ardis Hughs, MD  Anesthesia: General  Complications: None  Intraoperative findings:  #1 - cysto demonstrated feculant material within bladder  #2 - edematous and heaped up bladder mucosa in the left posterio-lateral wall consistent with colovesical fistula #3 - right retrograde pyelogram performed with 5 french open ended catheter using 5 cc of omnipaque contrast demonstrated normal caliber ureter with no hydro or feeling defects. #4 - left retrograde pyelogram performed with 5 F open ended catheter using 5cc on omnipaque contrast demonstrated a normal caliber ureter with no evidence of feeling defects or hydronephrosis. #5 - 80F open ended catheters were advanced up to the UPJs bilaterally and left indwelling and pulled through the urethra.    EBL: Minimal  Specimens: None  Indication: Dean [name] is a 72 y.o. patient with perforated diverticulitis.  After reviewing the management options for treatment, he elected to proceed with the above surgical procedure(s). We have discussed the potential benefits and risks of the procedure, side effects of the proposed treatment, the likelihood of the patient achieving the goals of the procedure, and any potential problems that might occur during the procedure or recuperation. Informed consent has been obtained.  Description of procedure:  The patient was taken to the operating room and general anesthesia was induced.  The patient was placed in the dorsal lithotomy position, prepped and draped in the usual sterile fashion, and preoperative antibiotics were administered. A preoperative time-out was performed.   21 French 30 degree cystoscope was then gently passed through  the patient's urethra and the bladder under visual guidance.  Immediately noted was the turbid urine with feculent material.  The bladder was then emptied and filled back up with normal saline.  Cystoscopy demonstrated orthotopic ureteral orifice ease.  There was a large amount of bullous edema and heaped up mucosa in the left posterior lateral wall consistent with a fistula.  I then used 5 [place] open-ended ureteral catheters cannulated the right ureter performed retrograde pyelogram using 5 cc of Omnipaque contrast with the above findings.  I then advanced the catheter up to the UPJ and slowly backed out the scope leaving the catheter behind.  I then repassed the cystoscope into the patient's bladder under visual guidance and performed a left retrograde pyelogram using an  5 [place] open-ended ureteral catheter.  The retrograde pyelogram demonstrated the above findings.  I then advanced the open-ended catheter to the left UPJ and slowly backed the scope out leaving the catheter in place.  I then advanced an 61 French three-way Foley catheter into the patient's bladder.  I tied the stents to the Foley with silk tie.  I then placed the tips of the catheters in the [facility] adapter and placed the catheter into a Foley bag.  The case was then turned over to Dr. Dema Severin.  Ardis Hughs, M.D.

## 2018-02-17 NOTE — Progress Notes (Signed)
Dr Dema Severin visited by the patient's bedside and stated that the foley is not to be removed.  Patient had a bladder repair and the foley is to remain in place for the next two weeks.  The foley is labeled as such per Dr. Orest Dikes request.

## 2018-02-17 NOTE — Consult Note (Signed)
TRH H&P   Patient Demographics:    Dean [name], is a 72 y.o. male  MRN: [id number]   DOB - 1946-01-07  Admit Date - 02/17/2018  Outpatient Primary MD for the patient is [name], PA-C  Referring MD/NP/PA:  Nadeen Landau  Outpatient Specialists:  Mertie Moores ?Wynetta Emery Bordon ?[facility]    Patient coming from: home  No chief complaint on file. feces in my urine    HPI:    Dean [name]  is a 72 y.o. male,  w Pafib, CAD, Gout,  Nephrolithiasis w recent diverticular abscess c/o feces in his urine for the past 4 weeks. Pt went to OR today for surgical correction.  Medicine consult requested     Review of systems:    In addition to the HPI above, No Fever-chills, No Headache, No changes with Vision or hearing, No problems swallowing food or Liquids, No Chest pain, Cough or Shortness of Breath,   No Blood in stool or Urine, No dysuria, No new skin rashes or bruises, No new joints pains-aches,  No new weakness, tingling, numbness in any extremity, No recent weight gain or loss, No polyuria, polydypsia or polyphagia, No significant Mental Stressors.  A full 10 point Review of Systems was done, except as stated above, all other Review of Systems were negative.   With Past History of the following :    Past Medical History:  Diagnosis Date  . Acid reflux    history of  . Anemia   . Arthropathy   . Atrial fibrillation ([place]) 10/2017   RVR  . Coronary artery disease   . Diverticulitis 11/26/2017  . Dyspnea   . Fatigue   . Frequent urination    frequent fecal drainage  . Gout   . Hepatitis A age 67  . History of kidney stones   . Hyperlipemia   . Hypogonadism male   . Irregular heart beat   . Ischemic cardiomyopathy   . Knee pain    history of  . Non compliance with medical treatment   . Non-STEMI (non-ST elevated myocardial  infarction) ([place]) 10/2017  . Palpitations   . Pelvic abscess in male [facility])    percutaneous drain placement  . Seasonal allergies   . Slow heart rate    occ 30's  . SOB (shortness of breath)   . Vitamin D deficiency       Past Surgical History:  Procedure Laterality Date  . BLADDER REPAIR  02/17/2018   Procedure: BLADDER REPAIR;  Surgeon: Ileana Roup, MD;  Location: [facility];  Service: General;;  . BLADDER REPAIR  02/17/2018   Procedure: pelvic exploration, assist with bladder repair ;  Surgeon: Raynelle Bring, MD;  Location: [facility];  Service: Urology;;  . COLONOSCOPY  ~ 2014   Dr Ferdinand Lango [facility].  Diverticulosis.  pt denies colon polyps.    Marland Kitchen  CORONARY ARTERY BYPASS GRAFT N/A 10/26/2017   Procedure: CORONARY ARTERY BYPASS GRAFTING (CABG) x four , using left internal mammary artery and bilateral legs greater saphenous vein harvested endoscopically;  Surgeon: Gaye Pollack, MD;  Location: [place] OR;  Service: Open Heart Surgery;  Laterality: N/A;  . CYSTOSCOPY W/ URETERAL STENT PLACEMENT Bilateral 02/17/2018   Procedure: CYSTOSCOPY WITH RETROGRADE PYELOGRAM/URETERAL STENT PLACEMENT;  Surgeon: Ardis Hughs, MD;  Location: [facility];  Service: Urology;  Laterality: Bilateral;  . IR CATHETER TUBE CHANGE  01/27/2018  . IR RADIOLOGIST EVAL & MGMT  12/21/2017  . LAPAROSCOPIC SIGMOID COLECTOMY N/A 02/17/2018   Procedure: LAPAROSCOPIC SIGMOID COLECTOMY  ERAS PATHWAY TAKEDOWN OF COLOVESSICAL COLOCUTANEOUS FISTULA;  Surgeon: Ileana Roup, MD;  Location: [facility];  Service: General;  Laterality: N/A;  . LEFT HEART CATH AND CORONARY ANGIOGRAPHY N/A 10/25/2017   Procedure: LEFT HEART CATH AND CORONARY ANGIOGRAPHY;  Surgeon: Leonie Man, MD;  Location: [place] CV LAB;  Service: Cardiovascular;  Laterality: N/A;  . SIGMOIDOSCOPY N/A 02/17/2018   Procedure: FLEX SIGMOIDOSCOPY;  Surgeon: Ileana Roup, MD;  Location: [facility];  Service: General;  Laterality: N/A;  . TEE WITHOUT  CARDIOVERSION N/A 10/26/2017   Procedure: TRANSESOPHAGEAL ECHOCARDIOGRAM (TEE);  Surgeon: Gaye Pollack, MD;  Location: [place];  Service: Open Heart Surgery;  Laterality: N/A;      Social History:     Social History   Tobacco Use  . Smoking status: Former Research scientist (life sciences)  . Smokeless tobacco: Never Used  Substance Use Topics  . Alcohol use: Yes    Alcohol/week: 0.0 oz    Comment: once monthly     Lives - at home  Mobility - walks by self   Family History :     Family History  Problem Relation Age of Onset  . Hyperlipidemia Mother   . Alzheimer's disease Mother   . Hyperlipidemia Brother   . Alzheimer's disease Maternal Grandmother   . Colon cancer Neg Hx   . Esophageal cancer Neg Hx   . Stomach cancer Neg Hx       Home Medications:   Prior to Admission medications   Medication Sig Start Date End Date Taking? Authorizing Provider  acetaminophen (TYLENOL) 650 MG CR tablet Take 650 mg by mouth 3 (three) times daily.   Yes [provider]  aspirin EC 81 MG EC tablet Take 1 tablet (81 mg total) by mouth daily. 11/06/17  Yes Lars Pinks M, PA-C  carvedilol (COREG) 3.125 MG tablet Take 1 tablet (3.125 mg total) by mouth 2 (two) times daily. 12/12/17 04/11/18 Yes Bhagat, Bhavinkumar, PA  Cholecalciferol (VITAMIN D3) 5000 units CAPS Take 5,000 Units daily by mouth.    Yes [provider]  febuxostat (ULORIC) 40 MG tablet Take 40 mg by mouth daily with lunch.    Yes [provider]  warfarin (COUMADIN) 1 MG tablet Take 1 tablet (1 mg total) by mouth daily at 6 PM. Patient taking differently: Take 1-2 mg by mouth daily at 6 PM. Take 2 tablets (2 mg) by mouth on Monday, Tuesday, Wednesday, & Friday. Take 1 tablet (1 mg) by mouth on Thursday, Saturday, & Sunday take 1 tablet. 12/19/17  Yes Conte, Tessa N, PA-C  colchicine 0.6 MG tablet Take 0.6 mg by mouth daily as needed (gout).     [provider]  furosemide (LASIX) 20 MG tablet TAKE 1 TABLET(20  MG) BY MOUTH DAILY AS NEEDED FOR FLUID RETENTION OR SWELLING Patient not taking:  Reported on 02/01/2018 12/23/17   Leanor Kail, PA  hydrocortisone cream 1 % Apply 1 application topically as needed for itching. Do NOT apply to sternal wound 11/06/17   Nani Skillern, PA-C     Allergies:     Allergies  Allergen Reactions  . Crestor [Rosuvastatin Calcium]     Reported intolerance to Lipitor in the past Crestor 40mg  patient reported muscle aches, dose reduced to rechallenge     Physical Exam:   Vitals  Blood pressure 119/62, pulse 84, temperature 97.9 F (36.6 C), temperature source Oral, resp. rate (!) 23, height 5' 9.5" (1.765 m), weight 70.9 kg (156 lb 4.9 oz), SpO2 95 %.   1. General  lying in bed in NAD,    2. Normal affect and insight, Not Suicidal or Homicidal, Awake Alert, Oriented X 3.  3. No F.N deficits, ALL C.Nerves Intact, Strength 5/5 all 4 extremities, Sensation intact all 4 extremities, Plantars down going.  4. Ears and Eyes appear Normal, Conjunctivae clear, PERRLA. Moist Oral Mucosa.  5. Supple Neck, No JVD, No cervical lymphadenopathy appriciated, No Carotid Bruits.  6. Symmetrical Chest wall movement, Good air movement bilaterally, CTAB.  7. RRR, No Gallops, Rubs or Murmurs, No Parasternal Heave.  8. Positive Bowel Sounds, Abdomen Soft, slight tenderness, No organomegaly appriciated,No rebound -guarding or rigidity.  9.  No Cyanosis, Normal Skin Turgor, No Skin Rash or Bruise.  10. Good muscle tone,  joints appear normal , no effusions, Normal ROM.  11. No Palpable Lymph Nodes in Neck or Axillae     Data Review:    CBC Recent Labs  Lab 02/17/18 1018 02/17/18 1624  WBC  --  6.1  HGB 9.2* 9.5*  HCT 27.0* 28.9*  PLT  --  161  MCV  --  89.8  MCH  --  29.5  MCHC  --  32.9  RDW  --  14.8   ------------------------------------------------------------------------------------------------------------------  Chemistries  Recent Labs    Lab 02/17/18 1018 02/17/18 1624  NA 138  --   K 3.3*  --   CREATININE  --  1.26*   ------------------------------------------------------------------------------------------------------------------ estimated creatinine clearance is 55.5 mL/min (A) (by C-G formula based on SCr of 1.26 mg/dL (H)). ------------------------------------------------------------------------------------------------------------------ No results for input(s): TSH, T4TOTAL, T3FREE, THYROIDAB in the last 72 hours.  Invalid input(s): FREET3  Coagulation profile Recent Labs  Lab 02/17/18 0610 02/17/18 0838  INR 1.79 1.85   ------------------------------------------------------------------------------------------------------------------- No results for input(s): DDIMER in the last 72 hours. -------------------------------------------------------------------------------------------------------------------  Cardiac Enzymes No results for input(s): CKMB, TROPONINI, MYOGLOBIN in the last 168 hours.  Invalid input(s): CK ------------------------------------------------------------------------------------------------------------------ No results found for: BNP   ---------------------------------------------------------------------------------------------------------------  Urinalysis    Component Value Date/Time   COLORURINE YELLOW 11/26/2017 0812   APPEARANCEUR HAZY (A) 11/26/2017 0812   LABSPEC 1.017 11/26/2017 0812   PHURINE 5.0 11/26/2017 0812   GLUCOSEU NEGATIVE 11/26/2017 0812   HGBUR SMALL (A) 11/26/2017 0812   BILIRUBINUR NEGATIVE 11/26/2017 0812   KETONESUR NEGATIVE 11/26/2017 0812   PROTEINUR NEGATIVE 11/26/2017 0812   NITRITE NEGATIVE 11/26/2017 0812   LEUKOCYTESUR NEGATIVE 11/26/2017 0812    ----------------------------------------------------------------------------------------------------------------   Imaging Results:    Dg C-arm 1-60 Min-no Report  Result Date:  02/17/2018 Fluoroscopy was utilized by the requesting physician.  No radiographic interpretation.      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 In short-term trials with alvimopan, no increased risk of MI was observed.  Pafib On coumadin at home AM Cardiology consult requested regarding whether needs to be bridge with lovenox or heparin when resuming coumadin  CAD  Check Lipid in am Cont Carvedilol 3.125mg  po bid Agree with Metoprolol 5mg  iv q6h prn Cont Aspirin 81mg  po qday  Hypertension Hold Furosemide temporarily Gout D/w pt that Uloric historically carried some increased cardiac risk Pt wishes to continue for now      DVT Prophylaxis Heparin - - SCDs  AM Labs Ordered, also please review Full Orders  Family Communication: Admission, patients condition and plan of care including tests being ordered have been discussed with the patient who indicate understanding and agree with the plan and Code Status.  Code Status FULL CODE  Likely DC to  home  Condition GUARDED    Consults called: cardiology by email  Admission status: inpatient  Time spent in minutes : 45   Jani Gravel M.D on 02/17/2018 at 10:21 PM  Between 7am to 7pm - Pager - [phone]. After 7pm go to [email or web address] - password [facility]  [facility] - Office  [phone]

## 2018-02-17 NOTE — Transfer of Care (Signed)
Immediate Anesthesia Transfer of Care Note  Patient: Dean [name]  Procedure(s) Performed: LAPAROSCOPIC SIGMOID COLECTOMY  ERAS PATHWAY TAKEDOWN OF COLOVESSICAL COLOCUTANEOUS FISTULA (N/A ) FLEX SIGMOIDOSCOPY (N/A ) BLADDER REPAIR CYSTOSCOPY WITH RETROGRADE PYELOGRAM/URETERAL STENT PLACEMENT (Bilateral ) pelvic exploration, assist with bladder repair   Patient Location: PACU  Anesthesia Type:General  Level of Consciousness: sedated  Airway & Oxygen Therapy: Patient Spontanous Breathing and Patient connected to face mask oxygen  Post-op Assessment: Report given to RN and Post -op Vital signs reviewed and stable  Post vital signs: Reviewed and stable  Last Vitals:  Vitals:   02/17/18 0548 02/17/18 0614  BP: 118/73   Pulse: 61   Resp: 18   Temp: (!) 35.9 C 36.8 C  SpO2: 100%     Last Pain:  Vitals:   02/17/18 0614  TempSrc: Oral  PainSc: 3       Patients Stated Pain Goal: 4 (46/50/35 4656)  Complications: No apparent anesthesia complications

## 2018-02-17 NOTE — H&P (Signed)
CC: Hx of perforated diverticulitis with abscess requiring percutaneous drain placement - here for surgery  HPI: Dean [name] is a pleasant [id number] with hx of CAD (s/p CABGx4 11/02/17), ischemic cardiomyopathy (LVEF 30%), gout, afib on warfarin, asymptomatic nephrolithiasis presented to [facility] ER on 11/26/17 with worsening left-sided and lower quadrant abdominal pain. This has been going on intermittently for a 2 year time frame. He initially thought this is all related to gas pains.   He was seen by his GI doctor, Dean [name] in [facility] and is sent for a CT scan 10/07/17 which showed focally perforated sigmoid diverticulitis and small pericolic abscesses 1.6B8.4YK in size. He was subsequently seen by Dean [name] and discharged on Cipro Flagyl. He had recurrence of his pain and underwent CT 10/21/17 which showed similar findings. He was sent home on abx with interval improvement.  On 10/26/17, he was admitted with NSTEMI and found to have high-grade left main and severe 3 vessel CAD with severe LV dysfunction. He was noted and had a normal LV function in August. He was taken to the OR emergently by the cardiac thoracic surgery team, Dean [name], for CABG 4 using native vein from both legs. He recovered well from this and was discharged 11/06/17.  He represented 11/25/17 with crampy left lower quadrant abdominal pain. He was afebrile and mildly tender in the left lower quadrant. His Dean [name] blood cell count was normal. He underwent CAT scan 11/25/17 which demonstrated persistent diverticulitis of the sigmoid colon and abscess measuring 3.8 x 3.5 x 3.9 cm abutting the dome of the bladder which was enlarged relative to his prior CT scans. He was admitted to the medicine service and his anticoagulation was held. Once his INR had normalized his taken to interventional radiology for [facility] drain placement which was done successfully and purulent material drained. He recovered from this and was ultimately  discharged home back on his warfarin. Since that time he reports doing well having had no further episodes of abdominal pain, fever, chills, nausea, vomiting. He does have increased frequency of urination but states that his urine is clear, yellow, no stool or gas in his urine stream. His drain has been in place and draining approximately 20-40 cc a day of purulent fluid.  Last colonoscopy 2014 in [place] at [facility] - had one 96mm polyp removed from the ?rectum. Pathology returned polypoid colonic mucosa, no dysplasia or malignancy - [name] based on the endoscopist's note he was to have a repeat in 10 years.  Since seeing him in the office earlier this month, he noted cloudy urine and some gas in his urinary stream. Following all this, his symptoms have dramatically improved. He denies fever/chills/nausea/vomiting.His LLQ pain has resolved. His urinary frequency has also improved a lot.    PMH: CAD (s/p CABGx4 11/02/17), ischemic cardiomyopathy (LVEF 30%), gout (managed by Dean [name] at [facility]), afib on warfarin, asymptomatic nephrolithiasis  PSH: No prior abdominal surgeries  FHx: Denies FHx of malignancy  Social: Denies use of tobacco/EtOH/drugs  ROS: A comprehensive 10 system review of systems was completed with the patient and pertinent findings as noted above.   Allergies Dean [name], Dean [name]; 12/29/2017 9:17 AM) ASPIRIN  Allergies Reconciled   Medication History Dean [name], Dean [name]; 12/29/2017 9:17 AM) Saline Flush (0.9% Solution, 1 (one) Intravenous daily, Taken starting 12/19/2017) Active. (Flush drain daily with 10 ml Normal Saline) Augmentin (500-125MG  Tablet, 1 (one) Oral two times daily, Taken starting 12/21/2017) Active. Warfarin Sodium (1MG  Tablet, Oral) Active. Uloric (  40MG  Tablet, Oral) Active. Hydrocortisone (1% Cream, External) Active. Tylenol (325MG  Tablet, Oral) Active. Coreg (3.125MG  Tablet, Oral) Active. Vitamin D3 (5000UNIT Tablet,  Oral) Active. Medications Reconciled  Review of Systems Dean [name]; 12/29/2017 10:04 AM) General Not Present- Appetite Loss, Chills, Fatigue, Fever, Night Sweats, Weight Gain and Weight Loss. Skin Not Present- Change in Wart/Mole, Dryness, Hives, Jaundice, New Lesions, Non-Healing Wounds, Rash and Ulcer. HEENT Not Present- Earache, Hearing Loss, Hoarseness, Nose Bleed, Oral Ulcers, Ringing in the Ears, Seasonal Allergies, Sinus Pain, Sore Throat, Visual Disturbances, Wears glasses/contact lenses and Yellow Eyes. Respiratory Not Present- Bloody sputum, Chronic Cough, Difficulty Breathing, Snoring and Wheezing. Breast Not Present- Breast Mass, Breast Pain, Nipple Discharge and Skin Changes. Cardiovascular Present- Shortness of Breath. Not Present- Chest Pain, Difficulty Breathing Lying Down, Leg Cramps, Palpitations, Rapid Heart Rate and Swelling of Extremities. Gastrointestinal Present- Change in Bowel Habits, Constipation, Hemorrhoids and Rectal Pain. Not Present- Abdominal Pain, Bloating, Bloody Stool, Chronic diarrhea, Difficulty Swallowing, Excessive gas, Gets full quickly at meals, Indigestion, Nausea and Vomiting. Male Genitourinary Present- Frequency and Nocturia. Not Present- Blood in Urine, Change in Urinary Stream, Impotence, Painful Urination, Urgency and Urine Leakage. Musculoskeletal Not Present- Back Pain, Joint Pain, Joint Stiffness, Muscle Pain, Muscle Weakness and Swelling of Extremities. Neurological Present- Weakness. Not Present- Decreased Memory, Fainting, Headaches, Numbness, Seizures, Tingling, Tremor and Trouble walking. Psychiatric Not Present- Anxiety, Bipolar, Change in Sleep Pattern, Depression, Fearful and Frequent crying. Endocrine Not Present- Cold Intolerance, Excessive Hunger, Hair Changes, Heat Intolerance and New Diabetes. Hematology Present- Blood Thinners and Easy Bruising. Not Present- Excessive bleeding, Gland problems, HIV and Persistent  Infections.  Vitals:   02/17/18 0548 02/17/18 0614  BP: 118/73   Pulse: 61   Resp: 18   Temp: (!) 96.7 F (35.9 C) 98.3 F (36.8 C)  TempSrc: Oral Oral  SpO2: 100%   Weight:  68 kg (150 lb)  Height:  5' 9.5" (1.765 m)    Physical Exam The physical exam findings are as follows: Note:Constitutional: No acute distress; conversant; no deformities Eyes: Moist conjunctiva; no lid lag; anicteric sclerae Neck: Trachea midline; no palpable thyromegaly Lungs: Normal respiratory effort; no tactile fremitus CV: Regular rate and rhythm; no palpable thrill; no pitting edema GI: Abdomen soft, nontender, nondistended; no palpable hepatosplenomegaly; IR drain in place with turbid brown drainage MSK: Normal gait; no clubbing/cyanosis Psychiatric: Appropriate affect; alert and oriented 3 Lymphatic: No palpable cervical or axillary lymphadenopathy  Assessment & [name]  Impression: Dean [name] is a pleasant 67KCL with hx of complicated diverticulitis - probable colovesicla fistula  -Cardiac clearance from Dr. Cathie Olden received and deemed low risk -Once above cleared/completed, will [name] laparoscopic vs open sigmoid colectomy; possible bladder repair; possible ostomy; flexible sigmoidoscopy; cysto/stents (urology); all other indicated procedures. -The anatomy and physiology of the GI tract was again discussed at length with the patient. The pathophysiology of diverticulitis was discussed at length with associated pictures and illustrations. -The planned procedure, material risks (including, but not limited to, pain, bleeding, infection, scarring, need for blood transfusion, damage to surrounding structures- blood vessels/nerves/viscus/organs, damage to ureter, urine leak, leak from anastomosis, need for additional procedures, need for stoma which may be permanent, hernia, recurrence, pneumonia, heart attack, stroke, death) benefits and alternatives to surgery were discussed at length. I noted a good  probability that the procedure would help improve his symptoms. His questions were answered to his satisfaction and he elected to proceed with surgery.   Dean [name], M.D. General and Colorectal Surgery  [facility]

## 2018-02-17 NOTE — Anesthesia Procedure Notes (Signed)
Procedure Name: Intubation Date/Time: 02/17/2018 7:51 AM Performed by: Lind Covert, CRNA Pre-anesthesia Checklist: Patient identified, Emergency Drugs available, Suction available, Patient being monitored and Timeout performed Patient Re-evaluated:Patient Re-evaluated prior to induction Oxygen Delivery Method: Circle system utilized Preoxygenation: Pre-oxygenation with 100% oxygen Induction Type: IV induction Laryngoscope Size: Mac and 4 Grade View: Grade I Tube type: Oral Number of attempts: 1 Airway Equipment and Method: Stylet Placement Confirmation: ETT inserted through vocal cords under direct vision,  positive ETCO2 and breath sounds checked- equal and bilateral Secured at: 23 cm Tube secured with: Tape Dental Injury: Teeth and Oropharynx as per pre-operative assessment

## 2018-02-17 NOTE — Op Note (Signed)
Preoperative diagnosis: Colovesical fistula  Postoperative diagnosis: Colovesical fistula  Surgeon: Pryor Curia MD  Description of procedure: Pelvic exploration with assessment of bladder repair  Complications: None  Estimated blood loss: Minimal  Indication: This is a 72 year old gentleman who has required a partial colectomy for diverticular disease.  Preoperative stents were requested and were placed earlier this morning by Dr. Louis Meckel.  During his cystoscopy, he was confirmed to have an apparent colovesical fistula on the left side of the bladder.  During the procedure, this opening was noted by Dr. Dema Severin.  Once the colon was resected, the bladder defect was closed in multiple layers with Vicryl suture.  I was then asked to come to assess the bladder repair and ensure that there is no remaining cystotomy.  Description of procedure: After the partial colectomy was completed but prior to his planned anastomosis, I assess the bladder.  There were noted to be multiple Vicryl sutures on the left lateral aspect of the bladder in the vicinity of the repair.  I further mobilized the bladder and open the space of Retzius and fully mobilize the bladder off its peritoneal attachments.  This allowed excellent exposure of the anterior, posterior, and lateral aspects of the bladder.  The ureteral stents could be felt in place within the ureters well away from the area that was repaired.  Once the bladder was mobilized, the bladder was filled with approximately 450 cc of sterile saline.  The bladder was felt to be tense without any evidence of extravasation of saline noted.  It was felt that the bladder repair was quite adequate and no further intervention for the bladder was required.  Dr. Dema Severin does plan to leave a pelvic drain and I will plan to follow this patient postoperatively.  His Foley catheter will remain indwelling.  His ureteral stents can be removed at the end of the procedure as the ureters  do not appear to be involved with his colovesical fistula or his colon resection today.

## 2018-02-17 NOTE — Op Note (Addendum)
PATIENT: Dean [name]  72 y.o. male  Patient Care Team: [name], PA-C as PCP - General (Physician Assistant) Dean [name], Dean Cheng, MD as PCP - Cardiology (Cardiology) Dean [name], Dean Cheng, MD as Consulting Physician (Cardiology) Gaye Pollack, MD as Consulting Physician (Cardiothoracic Surgery) Ladene Artist, MD as Consulting Physician (Gastroenterology)  PREOP DIAGNOSIS: Diverticulitis  POSTOP DIAGNOSIS: Diverticulitis  PROCEDURE:  1. Laparoscopic low anterior resection with double stapled colorectal anastomosis (coloproctostomy) 2. Takedown of colovesical fistula 3. Takedown of colocutaneous fistula 4. Repair of cystotomy (colovesical fistula) 5. Flexible sigmoidoscopy  -Cystoscopy/stents by urology (Dr. Louis Meckel) -Intraoperative consult with urology (Dr. Alinda Money)  SURGEON: Sharon Mt. Dema Severin, MD  ASSISTANT: Michael Boston, MD  ANESTHESIA: General endotracheal  COMPLICATIONS: None  EBL: 300cc Total I/O In: 8099 [I.V.:3700; Blood:475] Out: 59 [Urine:280; Blood:300]  DRAINS: 19Fr JP left draining the pelvis and bladder  SPECIMEN: 1. Sigmoid colon - staple line marks distal margin 2. Donuts from EEA anastomosis - blue prolene stitch marks proximal 3. Colocutaneous fistula (drain site)  COUNTS: Sponge, needle and instrument counts were reported correct x2  FINDINGS: Sigmoid colon perforated into to bladder and anterior abdominal wall. The percutaneous drain which he had in place had an associated abscess cavity in the space of Retzius that had also perforated into the bladder at this location. During Pfannenstiel portion of the procedure, the 2 cm hole in the bladder that was seen which was also seen during cystoscopy.  This had perforated into the space of Retzius at the location of the percutaneous drain and colocutaneous fistula. The identified cystotomy was repaired in 2 layers. Leak check revealed no evidence of bladder leak. Dr. Alinda Money was present for this  portion of the procedure and additionally mobilized/inspected the intraperitoneal surface of the bladder and no other leaks were identified. Sigmoidectomy and double stapled 40mm colorectal anastomosis.  Anastomosis was patent, pink, hemostatic, airtight, and located approximately 17 cm from the anal verge by flexible sigmoidoscopy.Marland Kitchen  STATEMENT OF MEDICAL NECESSITY: Mr. 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 This has been going on intermittently for a 2 year time frame.   He was seen by his GI doctor, Dr. Fuller Plan in [facility] and is sent for a CT scan 10/07/17 which showed focally perforated sigmoid diverticulitis and small pericolic abscesses [id number] in size. He was subsequently seen by Dr. Ninfa Linden and discharged on Cipro/flagyl. He had recurrence of his pain and underwent CT 10/21/17 which showed similar findings. He was sent home on abx with interval improvement.  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 He was noted and had a normal LV function in August. He was taken to the OR emergently by the cardiac thoracic surgery team, Dr. Cyndia Bent, for CABG 4 using native vein from both legs. He recovered well from this and was discharged 11/06/17.  He represented 11/25/17 with crampy LLQ abdominal pain. He underwent CT 11/25/17 which demonstrated persistent diverticulitis of the sigmoid colon and abscess measuring 3.8 x 3.5 x 3.9 cm abutting the dome of the bladder which was enlarged relative to his prior CT scans. He was admitted to the medicine service and his anticoagulation was held. Once his INR had normalized his taken to IR for percutaneous drain placement which was done successfully  and purulent  material drained. He recovered from this and was ultimately discharged home back on his warfarin. Following discharge, he had increased frequency of urination but stated that his urine was clear, yellow, no stool or gas in his urine stream. This subsequently was noted to be cloudy but he was otherwise asymptomatic, in fact feeling better since it became cloudy - likely a decompressed abscess/fistula  Last colonoscopy 2014 in [place] at [facility] - had one 56mm polyp removed from the ?rectum. Pathology returned polypoid colonic mucosa, no dysplasia or malignancy - plan based on the endoscopist's note he was to have a repeat in 2024 (10 years).  He underwent cardiac clearance and was deemed low risk and cleared.  The anatomy and physiology of the GI tract was again discussed at length with the patient. The pathophysiology of diverticulitis was discussed at length with associated pictures and illustrations. The planned procedure, material risks (including, but not limited to, pain, bleeding, infection, scarring, need for blood transfusion, damage to surrounding structures- blood vessels/nerves/viscus/organs, damage to ureter, urine leak, leak from anastomosis, need for additional procedures, need for stoma which may be permanent, hernia, recurrence, pneumonia, heart attack, stroke, death) benefits and alternatives to surgery were discussed at length. I noted a good probability that the procedure would help improve his symptoms. His questions were answered to his satisfaction and he elected to proceed with surgery.  NARRATIVE: Informed consent was verified. The patient was taken to the operating room, placed supine on the operating table and SCD's were applied. General endotracheal anesthesia was induced. The patient was then positioned in the lithotomy position with Allen stirrups.  Urology then scrubbed; the patient was prepped and draped for placement of ureteral stents and cystoscopy.   Antibiotics had been administered.  Cystoscopy/stents was then performed - please refer to dictation by Dr. Louis Meckel for details regarding this portion of the procedure - in brief, a ~2cm hole was identified in left posterio-lateral wall consistent with colovesical fistula as well as feculent material in the bladder.  The patient's abdomen was then prepped and draped in the standard sterile fashion. Surgical timeout confirmed our plan.  Abdominal entry was gained using the [facility] technique.  An infraumbilical incision was made.  This was carried down to the umbilical stalk which was dissected and grasped with a Kocher clamp.  The umbilical stalk was retracted outwardly in the midline infraumbilical fascia incised.  The peritoneum was then bluntly entered.  A stay suture of 0 Vicryl figure-of-eight was then placed.  The Hasson trocar was inserted into the peritoneal cavity and insufflation commenced to 28mmHg of CO2.  Camera inspection revealed no injury.  Bilateral TAP blocks were performed with a total mixture of 20cc Exparel+15cc 0.5%Marcaine/epi+15cc saline. Three additional 79mm ports were carefully placed under direct laparoscopic visualization, 2 in the right lateral abdomen and an additional in the left mid abdomen.  The patient was positioned in Trendelenburg with right side down. The omentum and small bowel was reflected cephalad. The sigmoid colon was densely adherent to the bladder and left abdominal wall extending towards the left pelvic sidewall. This was taken down with a combination of blunt and sharp dissection, staying right along the wall of the sigmoid colon. The abscess was located anteriorly along the midline low abdominal wall. The drain was in this cavity in the preperitoneal space where it had eroded through the peritoneum. The intraperitoneal portion of the bladder although inflamed did not demonstrate any visible defects following takedown of  the sigmoid colon. Following this and to  ensure safe ureter identification prior to additional medialization of the colon, a medial to lateral approach was utilized. The sigmoid colon and proximal rectum were retracted anteriorly. The peritoneum overlying the sacral promontory was incised and the avascular plane gained. The sigmoid mesentery and proximal mesorectum were reflected anteriorly. The space was developed and the left ureter containing a stent and gonadal vessels were readily identified and protected. The IMA pedicle was identified and circumferentially dissected.. The ureter was again confirmed to have been dropped "down." The IMA was then divided using an EnSeal energy device. The pedicle was observed and hemostasis verified. To ensure adequate mobilization and reach of the descending colon, the IMV was ligated proximally using the EnSeal. This pedicle was observed and noted to be hemostatic as well. Returning the the underside of the mesentery, the retroperitoneal plane was then further developed cephalad, staying anterior to Gerota's fascia. Following this, the lateral peritoneal attachments of the descending colon was incised along the Cornisha Zetino line of [place] up to the level of the spleen. At this point there was more than adequate amount of descending colon mobilization to reach into the pelvis without any tension.  The proximal rectum was identified where the tinea splayed, the appendices epiploicae disappeared and at a location overlying the sacral promontory. The proximal most portion of the mesorectum was divided to the point of planned transection of the colon. Following this, attention was turned to the extracorporeal portion of the procedure.  A Pfannenstiel incision was made 3 fingerbreadths above the pubic symphysis as this was just above where the bladder appeared densely adherent to the peritoneum and suspected location of the abscess cavity was located. The rectus fascia was identified and incised transversely. The space was  then developed. At this location, the abscess cavity was readily identified. It appeared that he had a perforation/abscess at this location where a drain was placed. The drain was identified in the cavity and was removed intact and passed off. A defect in the bladder at this location was also apparent as the Foley catheter was visible. The defect had health appearing surrounding mucosa with a relatively "clean" perforation. The peritoneum cephalad was incised and the peritoneal cavity entered. To prevent expansion of the cystotomy when the [place] wound protector was placed, the cystotomy was repaired. The edges of mucosa were healthy and clearly viable. The bladder was inspected and the cystotomy was 3-4cm from the left ureteral orifice which contained the yellow stent. The right UO was even further away. The cystotomy was then closed in two layers transversely. The first was a running 2-0 Vicryl suture, approximating mucosa to mucosa. The second contained the detrusor muscle and serosa and was done with 2-0 vicryl interrupted sutures.  Following this, an [place] wound protector was placed. The distal point of planned transection was identified where the proximal mesorectum had been divided. A contoured green load stapler was applied and closed. Inspection revealed no other structures in the staple line including the bladder. The stapler was then fired. The sigmoid was then eviscerated from the abdominal cavity. A proximal point of transection was selected at the distal descending colon where it was pink and had a palpable pulse in the associated marginal artery. The mesentery up to this point was divided with an EnSeal. A pursestring device was applied and a 2-0 prolene suture passed. The colon was then transected and passed off as specimen. EEA sizers were then used. A 2mm anastomosis was the  largest safest size the proximal bowel wound accommodate. A 48mm EEA anvil was placed and the pursestring tied. A small  amount of fat was cleared from the planned staple line. Hemostasis verified. There was a palpable pulse in the marginal at this level. The anvil reached into the deep pelvis without tension. At this point, Dr. Alinda Money scrubbed and the Ubaldo Glassing was removed. Please refer to his op note for details. In brief, the bladder was mobilized, filled and inspected. The bladder held the saline well and no leak identified and the repair was deemed satisfactory.  The colocutaneous/drain fistula skin and associated subcutaneous tissue was excised and passed off as specimen.  Attention was turned to closure. The wound protector was removed, all instruments were passed off. Gowns/gloves changed. A clean instrument table was then used. The surgical site was draped with additional sterile drapes. The abdomen was irrigated with sterile saline. A 19Fr round blake was placed draining the pelvis and bladder repair, and secured with a 2-0 nylon suture. The peritoneum was closed with a running 2-0 vicryl suture. The rectus fascia closed with running #1 PDS suture. The skin of all incision sites was approximated with 4-0 monocryl subcuticular sutures. A pursestring was used to decrease the size of the cutaneous defect at the colocutaneous/drain fistula tract. Dermabond was applied to all laparoscopic incisions. A Honeycomb was placed over the Pfannenstiel and the drain site covered with a drain dressing.  The ureteral stents were then removed without issue and the Foley catheter left in place.  An MD assistant was necessary for tissue manipulation, complex retraction and for safe creation of the anastomosis due to the complexity of this case.  DISPOSITION: PACU in satisfactory condition  Of note, this dictation was performed utilizing Dragon Dictation - any errors in transcription are unintentional

## 2018-02-17 NOTE — Progress Notes (Signed)
Dr. Dema Severin and Dr. Ola Spurr notified of pts pain in right leg and tingling in left leg inbetween old incisions in lower medial calf.  Pt reports pain in right calf started about a day and a half ago, but has subsided today per pt. Pt has had intermittent tingling in left leg prior to this episode of tingling. Potential Doppler studies were discussed with Dr. Dema Severin and Ola Spurr.  After collaboration with Dr. Ola Spurr and Dr. Dema Severin assessing pt, no order was given for doppler US study.  Received verbal order from Dr. Dema Severin to order 1 unit of Fresh Frozen Plasma for pt to get prior to surgery. Remo Lipps, CRNA notified and will be expecting call from blood bank to pick up unit of FFP.  Pt signed blood consent.

## 2018-02-18 ENCOUNTER — Encounter (HOSPITAL_COMMUNITY): Payer: Self-pay | Admitting: Cardiology

## 2018-02-18 DIAGNOSIS — N179 Acute kidney failure, unspecified: Secondary | ICD-10-CM | POA: Diagnosis present

## 2018-02-18 DIAGNOSIS — I480 Paroxysmal atrial fibrillation: Secondary | ICD-10-CM

## 2018-02-18 LAB — BASIC METABOLIC PANEL
ANION GAP: 11 (ref 5–15)
BUN: 20 mg/dL (ref 6–20)
CHLORIDE: 105 mmol/L (ref 101–111)
CO2: 21 mmol/L — AB (ref 22–32)
CREATININE: 1.74 mg/dL — AB (ref 0.61–1.24)
Calcium: 8.1 mg/dL — ABNORMAL LOW (ref 8.9–10.3)
GFR calc non Af Amer: 38 mL/min — ABNORMAL LOW (ref >60)
GFR, EST AFRICAN AMERICAN: 44 mL/min — AB (ref >60)
Glucose, Bld: 158 mg/dL — ABNORMAL HIGH (ref 65–99)
Potassium: 4.5 mmol/L (ref 3.5–5.1)
Sodium: 137 mmol/L (ref 135–145)

## 2018-02-18 LAB — CBC
HCT: 26.4 % — ABNORMAL LOW (ref 39.0–52.0)
HEMOGLOBIN: 8.5 g/dL — AB (ref 13.0–17.0)
MCH: 29 pg (ref 26.0–34.0)
MCHC: 32.2 g/dL (ref 30.0–36.0)
MCV: 90.1 fL (ref 78.0–100.0)
Platelets: 299 10*3/uL (ref 150–400)
RBC: 2.93 MIL/uL — AB (ref 4.22–5.81)
RDW: 15.2 % (ref 11.5–15.5)
WBC: 8.6 10*3/uL (ref 4.0–10.5)

## 2018-02-18 LAB — PROTIME-INR
INR: 1.41
Prothrombin Time: 17.1 seconds — ABNORMAL HIGH (ref 11.4–15.2)

## 2018-02-18 LAB — PREPARE FRESH FROZEN PLASMA
UNIT DIVISION: 0
Unit division: 0

## 2018-02-18 LAB — BPAM FFP
BLOOD PRODUCT EXPIRATION DATE: 201903202359
Blood Product Expiration Date: 201903202359
ISSUE DATE / TIME: 201903150735
ISSUE DATE / TIME: 201903150945
UNIT TYPE AND RH: 6200
Unit Type and Rh: 6200

## 2018-02-18 LAB — PHOSPHORUS: Phosphorus: 4.9 mg/dL — ABNORMAL HIGH (ref 2.5–4.6)

## 2018-02-18 LAB — MAGNESIUM: Magnesium: 1.4 mg/dL — ABNORMAL LOW (ref 1.7–2.4)

## 2018-02-18 MED ORDER — MAGNESIUM SULFATE 2 GM/50ML IV SOLN
2.0000 g | Freq: Once | INTRAVENOUS | Status: AC
  Administered 2018-02-18: 2 g via INTRAVENOUS
  Filled 2018-02-18: qty 50

## 2018-02-18 MED ORDER — SODIUM CHLORIDE 0.9 % IV SOLN
INTRAVENOUS | Status: DC
  Administered 2018-02-18 – 2018-02-19 (×2): via INTRAVENOUS

## 2018-02-18 MED ORDER — HALOPERIDOL LACTATE 5 MG/ML IJ SOLN
5.0000 mg | Freq: Four times a day (QID) | INTRAMUSCULAR | Status: DC | PRN
  Administered 2018-02-18: 5 mg via INTRAMUSCULAR
  Filled 2018-02-18: qty 1

## 2018-02-18 MED ORDER — HYDROCORTISONE 2.5 % RE CREA
TOPICAL_CREAM | Freq: Three times a day (TID) | RECTAL | Status: DC
  Administered 2018-02-19: 16:00:00 via RECTAL
  Administered 2018-02-19: 1 via RECTAL
  Administered 2018-02-20 – 2018-02-21 (×5): via RECTAL
  Filled 2018-02-18: qty 28.35

## 2018-02-18 MED ORDER — SODIUM CHLORIDE 0.9 % IV BOLUS (SEPSIS)
500.0000 mL | Freq: Once | INTRAVENOUS | Status: AC
  Administered 2018-02-18: 500 mL via INTRAVENOUS

## 2018-02-18 MED ORDER — SODIUM CHLORIDE 0.9 % IV BOLUS (SEPSIS)
500.0000 mL | Freq: Once | INTRAVENOUS | Status: AC | PRN
  Administered 2018-02-19: 500 mL via INTRAVENOUS

## 2018-02-18 NOTE — Progress Notes (Signed)
General Surgery [facility]  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       Office: [phone]    Chief Complaint: Colovesical fistula, diverticular disease  Subjective: Patient in bed, loose "brown water" BM this AM  Objective: Vital signs in last 24 hours: Temp:  [95.1 F (35.1 C)-97.9 F (36.6 C)] 97.5 F (36.4 C) (03/16 0800) Pulse Rate:  [61-87] 76 (03/16 0800) Resp:  [11-25] 24 (03/16 0800) BP: (75-149)/(46-87) 101/57 (03/16 0800) SpO2:  [95 %-100 %] 100 % (03/16 0800) Arterial Line BP: (131-139)/(61-65) 136/61 (03/15 1430) Weight:  [70.9 kg (156 lb 4.9 oz)] 70.9 kg (156 lb 4.9 oz) (03/15 1600) Last BM Date: 02/17/18  Intake/Output from previous day: 03/15 0701 - 03/16 0700 In: 6301.3 [I.V.:4826.3; Blood:475; IV Piggyback:1000] Out: 1700 [Urine:925; Drains:475; Blood:300] Intake/Output this shift: No intake/output data recorded.  Physical Exam: HEENT - sclerae clear, mucous membranes moist Neck - soft Chest - clear bilaterally Cor - RRR Abdomen - soft, mild distension; BS present; JP with serosanguinous output Ext - no edema, non-tender Neuro - alert & oriented, no focal deficits  Lab Results:  Recent Labs    02/17/18 1624 02/18/18 0330  WBC 6.1 8.6  HGB 9.5* 8.5*  HCT 28.9* 26.4*  PLT 161 299   BMET Recent Labs    02/17/18 1018 02/17/18 1624 02/18/18 0330  NA 138  --  137  K 3.3*  --  4.5  CL  --   --  105  CO2  --   --  21*  GLUCOSE  --   --  158*  BUN  --   --  20  CREATININE  --  1.26* 1.74*  CALCIUM  --   --  8.1*   PT/INR Recent Labs    02/17/18 0838 02/18/18 0330  LABPROT 21.2* 17.1*  INR 1.85 1.41   Comprehensive Metabolic  Panel:    Component Value Date/Time   NA 137 02/18/2018 0330   NA 138 02/17/2018 1018   NA 139 12/22/2017 1225   NA 139 09/02/2017 1335   K 4.5 02/18/2018 0330   K 3.3 (L) 02/17/2018 1018   CL 105 02/18/2018 0330   CL 102 02/10/2018 1216   CO2 21 (L) 02/18/2018 0330   CO2 27 02/10/2018 1216   BUN 20 02/18/2018 0330   BUN 14 02/10/2018 1216   BUN 18 12/22/2017 1225   BUN 16 09/02/2017 1335   CREATININE 1.74 (H) 02/18/2018 0330   CREATININE 1.26 (H) 02/17/2018 1624   GLUCOSE 158 (H) 02/18/2018 0330   GLUCOSE 107 (H) 02/10/2018 1216   CALCIUM 8.1 (L) 02/18/2018 0330   CALCIUM 9.3 02/10/2018 1216   AST 16 11/26/2017 0812   AST 19 10/22/2017 0838   ALT 11 (L) 11/26/2017 0812   ALT 7 (L) 10/22/2017 0838   ALKPHOS 75 11/26/2017 0812   ALKPHOS 58 10/22/2017 0838   BILITOT 0.7 11/26/2017 0812   BILITOT 0.6 10/22/2017 0838   PROT 7.0 11/26/2017 0812   PROT 7.1 10/22/2017 0838   ALBUMIN 2.8 (L) 11/26/2017 0812   ALBUMIN 3.1 (L) 10/22/2017 0838    Studies/Results: Dg C-arm  1-60 Min-no Report  Result Date: 02/17/2018 Fluoroscopy was utilized by the requesting physician.  No radiographic interpretation.      [place] M 02/18/2018  Patient ID: Dean [name], male   DOB: Oct 03, 1946, 72 y.o.   MRN: [id number]

## 2018-02-18 NOTE — Consult Note (Signed)
CARDIOLOGY CONSULT NOTE  Patient ID: Dean [name] MRN: [id number] DOB/AGE: 1946-04-13 72 y.o.  Admit date: 02/17/2018 Primary Physician [name], PA-C Primary Cardiologist Mertie Moores, MD Chief Complaint  Atrial fib.  Requesting  Dr. Jani Gravel  HPI:  The patient was admitted with a colovesical fistula and had repair on 3/15.  We are consulted to assist with anticoagulation management.  He has a history of atrial fib.  He has a history of CAD with CABG in Nov.    He reports that he has been doing well since bypass.  He might get twinges of sternal wound pain.  However, he has had no symptomatic evidence of recurrent atrial fib.  No palpitations, presyncope or syncope.    Past Medical History:  Diagnosis Date  . Acid reflux    history of  . Anemia   . Atrial fibrillation ([place]) 10/2017   RVR  . Coronary artery disease   . Diverticulitis 11/26/2017  . Gout   . Hepatitis A age 25  . History of kidney stones   . Hyperlipemia   . Hypogonadism male   . Irregular heart beat   . Ischemic cardiomyopathy   . Non compliance with medical treatment   . Non-STEMI (non-ST elevated myocardial infarction) ([place]) 10/2017  . Pelvic abscess in male [facility])    percutaneous drain placement    Past Surgical History:  Procedure Laterality Date  . BLADDER REPAIR  02/17/2018   Procedure: BLADDER REPAIR;  Surgeon: Ileana Roup, MD;  Location: [facility];  Service: General;;  . BLADDER REPAIR  02/17/2018   Procedure: pelvic exploration, assist with bladder repair ;  Surgeon: Raynelle Bring, MD;  Location: [facility];  Service: Urology;;  . COLONOSCOPY  ~ 2014   Dr Ferdinand Lango in [facility].  Diverticulosis.  pt denies colon polyps.    . CORONARY ARTERY BYPASS GRAFT N/A 10/26/2017   Procedure: CORONARY ARTERY BYPASS GRAFTING (CABG) x four , using left internal mammary artery and bilateral legs greater saphenous vein harvested endoscopically;  Surgeon: Gaye Pollack, MD;  Location: [place] OR;  Service:  Open Heart Surgery;  Laterality: N/A;  . CYSTOSCOPY W/ URETERAL STENT PLACEMENT Bilateral 02/17/2018   Procedure: CYSTOSCOPY WITH RETROGRADE PYELOGRAM/URETERAL STENT PLACEMENT;  Surgeon: Ardis Hughs, MD;  Location: [facility];  Service: Urology;  Laterality: Bilateral;  . IR CATHETER TUBE CHANGE  01/27/2018  . IR RADIOLOGIST EVAL & MGMT  12/21/2017  . LAPAROSCOPIC SIGMOID COLECTOMY N/A 02/17/2018   Procedure: LAPAROSCOPIC SIGMOID COLECTOMY  ERAS PATHWAY TAKEDOWN OF COLOVESSICAL COLOCUTANEOUS FISTULA;  Surgeon: Ileana Roup, MD;  Location: [facility];  Service: General;  Laterality: N/A;  . LEFT HEART CATH AND CORONARY ANGIOGRAPHY N/A 10/25/2017   Procedure: LEFT HEART CATH AND CORONARY ANGIOGRAPHY;  Surgeon: Leonie Man, MD;  Location: [place] [facility];  Service: Cardiovascular;  Laterality: N/A;  . SIGMOIDOSCOPY N/A 02/17/2018   Procedure: FLEX SIGMOIDOSCOPY;  Surgeon: Ileana Roup, MD;  Location: [facility];  Service: General;  Laterality: N/A;  . TEE WITHOUT CARDIOVERSION N/A 10/26/2017   Procedure: TRANSESOPHAGEAL ECHOCARDIOGRAM (TEE);  Surgeon: Gaye Pollack, MD;  Location: [place];  Service: Open Heart Surgery;  Laterality: N/A;    Allergies  Allergen Reactions  . Crestor [Rosuvastatin Calcium]     Reported intolerance to Lipitor in the past Crestor 40mg  patient reported muscle aches, dose reduced to rechallenge   Medications Prior to Admission  Medication Sig Dispense Refill Last Dose  . acetaminophen (TYLENOL) 650  MG CR tablet Take 650 mg by mouth 3 (three) times daily.   Past Week at Unknown time  . aspirin EC 81 MG EC tablet Take 1 tablet (81 mg total) by mouth daily.   02/16/2018  . carvedilol (COREG) 3.125 MG tablet Take 1 tablet (3.125 mg total) by mouth 2 (two) times daily. 60 tablet 3 02/17/2018 at 0400  . Cholecalciferol (VITAMIN D3) 5000 units CAPS Take 5,000 Units daily by mouth.    02/16/2018 at Unknown time  . febuxostat (ULORIC) 40 MG tablet Take 40 mg by  mouth daily with lunch.    Past Week at Unknown time  . warfarin (COUMADIN) 1 MG tablet Take 1 tablet (1 mg total) by mouth daily at 6 PM. (Patient taking differently: Take 1-2 mg by mouth daily at 6 PM. Take 2 tablets (2 mg) by mouth on Monday, Tuesday, Wednesday, & Friday. Take 1 tablet (1 mg) by mouth on Thursday, Saturday, & Sunday take 1 tablet.) 90 tablet 0 02/11/2018  . colchicine 0.6 MG tablet Take 0.6 mg by mouth daily as needed (gout).    More than a month at Unknown time  . furosemide (LASIX) 20 MG tablet TAKE 1 TABLET(20 MG) BY MOUTH DAILY AS NEEDED FOR FLUID RETENTION OR SWELLING (Patient not taking: Reported on 02/01/2018) 90 tablet 3 Not Taking at Unknown time  . hydrocortisone cream 1 % Apply 1 application topically as needed for itching. Do NOT apply to sternal wound 30 g 0 Unknown at Unknown time   Family History  Problem Relation Age of Onset  . Hyperlipidemia Mother   . Alzheimer's disease Mother   . Hyperlipidemia Brother   . Alzheimer's disease Maternal Grandmother   . Colon cancer Neg Hx   . Esophageal cancer Neg Hx   . Stomach cancer Neg Hx     Social History   Socioeconomic History  . Marital status: Single    Spouse name: Not on file  . Number of children: Not on file  . Years of education: Not on file  . Highest education level: Not on file  Social Needs  . Financial resource strain: Not on file  . Food insecurity - worry: Not on file  . Food insecurity - inability: Not on file  . Transportation needs - medical: Not on file  . Transportation needs - non-medical: Not on file  Occupational History  . Occupation: retired  Tobacco Use  . Smoking status: Former Research scientist (life sciences)  . Smokeless tobacco: Never Used  Substance and Sexual Activity  . Alcohol use: Yes    Alcohol/week: 0.0 oz    Comment: once monthly  . Drug use: No  . Sexual activity: Not on file  Other Topics Concern  . Not on file  Social History Narrative  . Not on file     ROS:   He says he has  short term memory issues and hallucinations currently.  Otherwise as stated in the HPI and negative for all other systems.  Physical Exam: Blood pressure (!) 104/47, pulse 70, temperature 97.8 F (36.6 C), temperature source Oral, resp. rate 18, height 5' 9.5" (1.765 m), weight 156 lb 4.9 oz (70.9 kg), SpO2 98 %.  GENERAL:  Well appearing NECK:  No jugular venous distention, waveform within normal limits, carotid upstroke brisk and symmetric, no bruits, no thyromegaly LYMPHATICS:  No cervical, inguinal adenopathy LUNGS:  Clear to auscultation bilaterally BACK:  No CVA tenderness CHEST:  Well healed sternotomy scar. HEART:  PMI not displaced or  sustained,S1 and S2 within normal limits, no S3, no S4, no clicks, no rubs, no murmurs ABD:  Flat, positive bowel sounds normal in frequency in pitch, no bruits, no rebound, no guarding, no midline pulsatile mass, no hepatomegaly, no splenomegaly EXT:  2 plus pulses throughout, no edema, no cyanosis no clubbing SKIN:  No rashes no nodules NEURO:  Cranial nerves II through XII grossly intact, motor grossly intact throughout PSYCH:  Cognitively intact, oriented to person place and time   Labs: Lab Results  Component Value Date   BUN 20 02/18/2018   Lab Results  Component Value Date   CREATININE 1.74 (H) 02/18/2018   Lab Results  Component Value Date   NA 137 02/18/2018   K 4.5 02/18/2018   CL 105 02/18/2018   CO2 21 (L) 02/18/2018   Lab Results  Component Value Date   TROPONINI 0.03 ([place]) 11/26/2017   Lab Results  Component Value Date   WBC 8.6 02/18/2018   HGB 8.5 (L) 02/18/2018   HCT 26.4 (L) 02/18/2018   MCV 90.1 02/18/2018   PLT 299 02/18/2018   Lab Results  Component Value Date   CHOL 128 10/25/2017   HDL 39 (L) 10/25/2017   LDLCALC 68 10/25/2017   TRIG 104 10/25/2017   CHOLHDL 3.3 10/25/2017   Lab Results  Component Value Date   ALT 11 (L) 11/26/2017   AST 16 11/26/2017   ALKPHOS 75 11/26/2017   BILITOT 0.7  11/26/2017    EKG:  No EKG this admission.  Tele with NSR  ASSESSMENT AND PLAN:   CAD:   Status post CABG.  No evidence of any cardiovascular complications with surgery.  Continue meds as listed.   ATRIAL FIB:  No evidence of recurrent fib.  Can resume warfarin when OK with surgery.  No need for Lovenox bridge.  He is instructed to keep his appt with Dr. Acie Fredrickson on 4/4 to discuss discontinuing anticoagulation altogether.    Please call with further questions.   SignedMinus Breeding 02/18/2018, 8:05 AM

## 2018-02-18 NOTE — Evaluation (Signed)
Occupational Therapy Evaluation Patient Details Name: Dean [name] MRN: [id number] DOB: 05-27-1946 Today's Date: 02/18/2018    History of Present Illness  Dean [name] is a pleasant [phone] with hx of CAD (s/p CABGx4 11/02/17), ischemic cardiomyopathy (LVEF 30%), gout, afib, asymptomatic nephrolithiasis,  perforated diverticulitis with abscess requiring percutaneous drain placement. On 02/17/18 underwent Laparoscopic low anterior resection with double stapled colorectal anastomosis (coloproctostomy), takedown of colovesical fistula, takedown of colocutaneous fistula, repair of cystotomy (colovesical fistula)   Clinical Impression   [name] admitted and underwent above presents to acute OT with initial BP from chart being 86/46 with RN OK'ing me to see pt but to check BP again before having pt move. BP 77/52, repositioned cuff and it was 92/48. Pt sat up on side of bed and said he needed to get to [facility] without c/o dizziness; however on way back to bed from [facility] he said he was really dizzy and stopped talking to us---once supine pt BP 93/58. Pt PLOF is totally independent with all basic ADLs, IADLs, driving and lives by himself. Currently pt is not able to care for himself and per his report will only have 1 hour a day A at home, feel pt currently needs 24 hour care thus I am recommending SNF prior to D/C.    Follow Up Recommendations  SNF;Supervision/Assistance - 24 hour    Equipment Recommendations  3 in 1 bedside commode;Tub/shower seat       Precautions / Restrictions Precautions Precautions: Fall Precaution Comments: abdominal incisions, bulb drain, Urethral Catheter  Restrictions Weight Bearing Restrictions: No      Mobility Bed Mobility Overal bed mobility: Needs Assistance Bed Mobility: Rolling;Supine to Sit Rolling: Min guard   Supine to sit: Mod assist(trunk elevation)     General bed mobility comments: Cued pt to roll left and he did, but then when asking him to sit up  for his side, he went back to his back and started to bring his legs off of the bed and bring trunk straight up. Stopped him and cued him to roll onto left side again and come up from left side when again he did as before  Transfers Overall transfer level: Needs assistance Equipment used: 1 person hand held assist Transfers: Sit to/from [facility] Sit to Stand: Min assist Stand pivot transfers: Min assist            Balance Overall balance assessment: Needs assistance Sitting-balance support: No upper extremity supported;Feet supported Sitting balance-Leahy Scale: Good     Standing balance support: Single extremity supported;During functional activity Standing balance-Leahy Scale: Poor                             ADL either performed or assessed with clinical judgement   ADL Overall ADL's : Needs assistance/impaired Eating/Feeding: Independent;Bed level   Grooming: Set up;Supervision/safety;Sitting   Upper Body Bathing: Supervision/ safety;Set up;Sitting   Lower Body Bathing: Moderate assistance Lower Body Bathing Details (indicate cue type and reason): min A sit<>stand Upper Body Dressing : Set up;Supervision/safety;Sitting   Lower Body Dressing: Maximal assistance Lower Body Dressing Details (indicate cue type and reason): min A sit<>stand Toilet Transfer: Minimal assistance;Stand-pivot;BSC   Toileting- Clothing Manipulation and Hygiene: Minimal assistance;Sit to/from stand               Vision Patient Visual Report: No change from baseline  Pertinent Vitals/Pain Pain Assessment: 0-10 Pain Score: 6  Pain Location: abdomen where drain is Pain Descriptors / Indicators: Grimacing;Sore Pain Intervention(s): Limited activity within patient's tolerance;Monitored during session;Repositioned     Hand Dominance Right   Extremity/Trunk Assessment Upper Extremity Assessment Upper Extremity Assessment: LUE  deficits/detail LUE Deficits / Details: Reports sore shoulder which is new           Communication Communication Communication: No difficulties   Cognition Arousal/Alertness: Awake/alert Behavior During Therapy: Restless Overall Cognitive Status: Impaired/Different from baseline Area of Impairment: Orientation;Following commands;Safety/judgement;Problem solving                 Orientation Level: (had a hard time coming up with [facility])     Following Commands: Follows one step commands inconsistently Safety/Judgement: Decreased awareness of safety;Decreased awareness of deficits   Problem Solving: Difficulty sequencing;Requires verbal cues;Requires tactile cues General Comments: Says he has been hallucinating (people in room, bugs, and spots on floor), reports he has trouble with depth perception at times while he has been here, says he has been reading a book--there is one in his room, but he cannot tell me anything about it and he knows he cannot remember anything about it              Home Living Family/patient expects to be discharged to:: Skilled nursing facility Living Arrangements: Alone Available Help at Discharge: Neighbor Type of Home: Apartment Home Access: Stairs to enter [employer] of Steps: 3 flights Entrance Stairs-Rails: Can reach both Home Layout: One level     Bathroom Shower/Tub: Corporate investment banker: Standard     Home Equipment: None   Additional Comments: Says a neighbor can A him 1 hour per day      Prior Functioning/Environment Level of Independence: Independent                 OT Problem List: Decreased range of motion;Impaired balance (sitting and/or standing);Decreased cognition;Decreased safety awareness      OT Treatment/Interventions: Self-care/ADL training;Balance training;DME and/or AE instruction;Patient/family education    OT Goals(Current goals can be found in the care plan section)  Acute Rehab OT Goals Patient Stated Goal: to go home OT Goal Formulation: With patient Time For Goal Achievement: 03/04/18 Potential to Achieve Goals: Good  OT Frequency: Min 2X/week   Barriers to D/C: Decreased caregiver support             AM-PAC PT "6 Clicks" Daily Activity     Outcome Measure Help from another person eating meals?: None Help from another person taking care of personal grooming?: A Little Help from another person toileting, which includes using toliet, bedpan, or urinal?: A Little Help from another person bathing (including washing, rinsing, drying)?: A Little Help from another person to put on and taking off regular upper body clothing?: A Little Help from another person to put on and taking off regular lower body clothing?: A Lot 6 Click Score: 18   End of Session Nurse Communication: (RN in room with me for getting pt to/from [facility] and to hear him report about depth perception and hallucinations)  Activity Tolerance: Other (comment)(limited by dizziness) Patient left: in bed;with call bell/phone within reach;with bed alarm set  OT Visit Diagnosis: Unsteadiness on feet (R26.81);Pain;Other symptoms and signs involving cognitive function;Muscle weakness (generalized) (M62.81) Pain - Right/Left: Left Pain - part of body: Shoulder(and abdomen)                Time: 4132-4401 OT  Time Calculation (min): 36 min Charges:  OT General Charges $OT Visit: 1 Visit OT Evaluation $OT Eval Moderate Complexity: 1 Mod OT Treatments $Self Care/Home Management : 8-22 mins Golden Circle, OTR/L [phone] 02/18/2018

## 2018-02-18 NOTE — Progress Notes (Deleted)
  Was the fall witnessed: no  Patient condition before and after the fall: confused and alert, agitated.    Patient's reaction to the fall: none  Name of the doctor that was notified including date and time: [place] , 02/18/2018 @0450  am  Any interventions and vital signs: HR 100, 97% on RA, 29 R/R, B/P 150/112, new orders received and initiated

## 2018-02-18 NOTE — Progress Notes (Signed)
Patient Demographics:    Dean [name], is a 72 y.o. male, DOB - 07-08-1946, ZCH:[id number]  Admit date - 02/17/2018   Admitting Physician Ileana Roup, MD  Outpatient Primary MD for the patient is [name], PA-C  LOS - 1  No chief complaint on file.       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 In short-term trials with alvimopan, no increased risk of MI was observed.... Continue Coreg as BP allows, prior to admission patient was on Coumadin, anticoagulation on hold, continue aspirin 81 mg daily  3)H/o Gout-apparently  Uloric carries increased cardiac risk  4)Pafib- Continue Coreg as BP allows, prior to admission patient was on Coumadin, anticoagulation on hold, may resume anticoagulation when okay with surgical team, patient has appointment with Dr. Acie Fredrickson on 03/09/2018  5)AKI-creatinine is up to 1.75, patient has developed acute kidney injury, hydrate, avoid nephrotoxic agents, monitor renal function closely  DVT Prophylaxis Heparin - - SCDs  Code Status : Full  Disposition Plan  : home  Consults  :  Gen Surgery/Cardiology/Urology  Lab Results  Component Value Date   PLT 299 02/18/2018   Inpatient Medications  Scheduled Meds: . acetaminophen  650 mg Oral Q6H  . alvimopan   12 mg Oral BID  . aspirin EC  81 mg Oral Daily  . carvedilol  3.125 mg Oral BID  . febuxostat  40 mg Oral Q lunch  . gabapentin  300 mg Oral TID  . heparin injection (subcutaneous)  5,000 Units Subcutaneous Q8H  . hydrocortisone   Rectal TID   Continuous Infusions: . lactated ringers 75 mL/hr at 02/18/18 0013  . sodium chloride     PRN Meds:.alum & mag hydroxide-simeth, diphenhydrAMINE **OR** diphenhydrAMINE, haloperidol lactate, HYDROmorphone (DILAUDID) injection, metoprolol tartrate, ondansetron **OR** ondansetron (ZOFRAN) IV, oxyCODONE, sodium chloride   Anti-infectives (From admission, onward)   Start     Dose/Rate Route Frequency Ordered Stop   02/17/18 0542  cefoTEtan in Dextrose 5% (CEFOTAN) IVPB 2 g     2 g Intravenous On call to O.R. 02/17/18 0542 02/17/18 0754   02/17/18 0542  neomycin (MYCIFRADIN) tablet 1,000 mg  Status:  Discontinued     1,000 mg Oral 3 times per day 02/17/18 0542 02/17/18 1546   02/17/18 0542  metroNIDAZOLE (FLAGYL) tablet 1,000 mg  Status:  Discontinued     1,000 mg Oral 3 times per day 02/17/18 0542 02/17/18 1546       Objective:   Vitals:   02/18/18 1338 02/18/18 1400 02/18/18 1700 02/18/18 1804  BP: (!) 93/58 (!) 102/55 (!) 89/46   Pulse:  65 64   Resp: (!) 25  19 14   Temp:      TempSrc:      SpO2:  (!) 78% 98%   Weight:    71.8 kg (158 lb 4.6 oz)  Height:    5\' 9"  (1.753 m)    Wt Readings from Last 3 Encounters:  02/18/18 71.8 kg (158 lb 4.6 oz)  02/10/18 68.2 kg (150 lb 4 oz)  12/22/17 71.3 kg (157 lb 3.2 oz)     Intake/Output Summary (Last 24 hours) at 02/18/2018 1843 Last data filed at 02/18/2018 1753 Gross per 24 hour  Intake 2575 ml  Output 807 ml  Net 1768 ml    Physical Exam Gen:- Awake Alert,  In no apparent distress  HEENT:- [place].AT, No sclera icterus Neck-Supple Neck,No JVD,.  Lungs-  CTAB  CV- S1, S2 normal, CABG scar Abd-  +ve B.Sounds, Abd Soft, appropriate postop tenderness, right lower quadrant JP drain with  sanguinous material Extremity/Skin:- No  edema,    Psych-affect is appropriate, oriented x3 Neuro-no new focal deficits, no tremors   Data Review:   Micro Results Recent Results (from the past 240 hour(s))  MRSA PCR Screening     Status: None   Collection Time: 02/17/18  5:03 PM  Result Value Ref Range Status   MRSA by PCR NEGATIVE NEGATIVE Final    Comment:        The GeneXpert MRSA Assay (FDA approved for NASAL specimens only), is one component of a comprehensive MRSA colonization surveillance program. It is not intended to diagnose MRSA infection nor to guide or monitor treatment for MRSA infections. Performed at [facility], [place] [address] [place], [place] [address]    Radiology Reports Ir Catheter Tube Change  Result Date: 01/27/2018 INDICATION: 72 year old male with complicated diverticulitis and clinical signs of a colovesicular fistula. He is awaiting surgical repair but having trouble with his percutaneous drainage catheter. The catheter has been in place since 12/02/2017. The patient has skin breakdown around the retention suture from chronic traction. Additionally, the tube may be partially clogged. EXAM: IR CATHETER TUBE CHANGE MEDICATIONS: None ANESTHESIA/SEDATION: None COMPLICATIONS: None immediate. PROCEDURE: Informed written consent was obtained from the patient after a thorough discussion of the procedural risks, benefits and alternatives. All questions were addressed. Maximal Sterile Barrier Technique was utilized including caps, mask, sterile gowns, sterile gloves, sterile drape, hand hygiene and skin antiseptic. A timeout was performed prior to the initiation of the procedure. A gentle hand injection of contrast material was performed. There is immediate passage of contrast material from the drainage catheter into the urinary bladder. No communication with the sigmoid colon. The distal aspect of the drainage catheter is partially occluded. The  retention suture was cut and removed. The catheter was transected and removed over a short Amplatz wire. A new 10 [place] Flexima all-purpose drainage catheter was advanced over the wire and formed. The catheter was secured to the skin with an adhesive fixation device. No retention suture was placed. IMPRESSION: 1. Fistulous communication between the bladder and the percutaneous drainage catheter. 2. Successful exchange for a new 10 French percutaneous drain. 3. Instructed patient to apply Neosporin or triple antibiotic ointment to the area of skin breakdown for the next few days to facilitate healing. 4. Patient has surgery scheduled in approximately 3 weeks. If he cannot tolerate the presence of the drainage catheter, it could conceivably be removed the week prior to surgery. 5. Signed, Criselda Peaches, MD [facility] Radiology Electronically Signed  By: Jacqulynn Cadet M.D.   On: 01/27/2018 17:24   Dg C-arm 1-60 Min-no Report  Result Date: 02/17/2018 Fluoroscopy was utilized by the requesting physician.  No radiographic interpretation.    CBC Recent Labs  Lab 02/17/18 1018 02/17/18 1624 02/18/18 0330  WBC  --  6.1 8.6  HGB 9.2* 9.5* 8.5*  HCT 27.0* 28.9* 26.4*  PLT  --  161 299  MCV  --  89.8 90.1  MCH  --  29.5 29.0  MCHC  --  32.9 32.2  RDW  --  14.8 15.2   Chemistries  Recent Labs  Lab 02/17/18 1018 02/17/18 1624 02/18/18 0330  NA 138  --  137  K 3.3*  --  4.5  CL  --   --  105  CO2  --   --  21*  GLUCOSE  --   --  158*  BUN  --   --  20  CREATININE  --  1.26* 1.74*  CALCIUM  --   --  8.1*  MG  --   --  1.4*   ------------------------------------------------------------------------------------------------------------------ No results for input(s): CHOL, HDL, LDLCALC, TRIG, CHOLHDL, LDLDIRECT in the last 72 hours.  Lab Results  Component Value Date   HGBA1C 5.8 (H) 10/25/2017    ------------------------------------------------------------------------------------------------------------------ No results for input(s): TSH, T4TOTAL, T3FREE, THYROIDAB in the last 72 hours.  Invalid input(s): FREET3 ------------------------------------------------------------------------------------------------------------------ No results for input(s): VITAMINB12, FOLATE, FERRITIN, TIBC, IRON, RETICCTPCT in the last 72 hours.  Coagulation profile Recent Labs  Lab 02/17/18 0610 02/17/18 0838 02/18/18 0330  INR 1.79 1.85 1.41    No results for input(s): DDIMER in the last 72 hours.  Cardiac Enzymes No results for input(s): CKMB, TROPONINI, MYOGLOBIN in the last 168 hours.  Invalid input(s): CK ------------------------------------------------------------------------------------------------------------------ No results found for: BNP   Roxan Hockey M.D on 02/18/2018 at 6:43 PM  Between 7am to 7pm - Pager - [phone]  After 7pm go to [email or web address] - password [email or web address]  [facility] -  Office  [phone]   Voice Recognition Viviann Spare dictation system was used to create this note, attempts have been made to correct errors. Please contact the author with questions and/or clarifications.

## 2018-02-18 NOTE — Progress Notes (Signed)
Patient ID: Dean [name], male   DOB: [birth year], 72 y.o.   MRN: [id number]  1 Day Post-Op Subjective: Pt doing well post op day # 1 s/p repair of colovesical fistula.  Objective: Vital signs in last 24 hours: Temp:  [95.1 F (35.1 C)-97.9 F (36.6 C)] 97.5 F (36.4 C) (03/16 0800) Pulse Rate:  [61-87] 76 (03/16 0800) Resp:  [11-25] 24 (03/16 0800) BP: (75-149)/(46-87) 101/57 (03/16 0800) SpO2:  [95 %-100 %] 100 % (03/16 0800) Arterial Line BP: (131-139)/(61-65) 136/61 (03/15 1430) Weight:  [70.9 kg (156 lb 4.9 oz)] 70.9 kg (156 lb 4.9 oz) (03/15 1600)  Intake/Output from previous day: 03/15 0701 - 03/16 0700 In: 6301.3 [I.V.:4826.3; Blood:475; IV Piggyback:1000] Out: 1700 [Urine:925; Drains:475; Blood:300] Intake/Output this shift: Total I/O In: -  Out: 10 [Drains:10]  Physical Exam:  General: Alert and oriented Abdomen: Soft, ND, Drain with serosanguinous fluid but minimal output in drain GU: Foley with clear urine  Lab Results: Recent Labs    02/17/18 1018 02/17/18 1624 02/18/18 0330  HGB 9.2* 9.5* 8.5*  HCT 27.0* 28.9* 26.4*   BMET Recent Labs    02/17/18 1018 02/17/18 1624 02/18/18 0330  NA 138  --  137  K 3.3*  --  4.5  CL  --   --  105  CO2  --   --  21*  GLUCOSE  --   --  158*  BUN  --   --  20  CREATININE  --  1.26* 1.74*  CALCIUM  --   --  8.1*     Studies/Results:   Assessment/Plan: POD # 1 s/p colovesical repair - Continue urethral catheter and pelvic drain.  Considering renal function and decreased UOP, will benefit from fluid hydration (will defer to primary team).  Follow renal function.  Likely can check drain Cr tomorrow if outputs appropriate.  Will need outpatient cystogram per Dr. Louis Meckel in about 10 days.   LOS: 1 day   Krystofer Hevener,LES 02/18/2018, 9:35 AM

## 2018-02-18 NOTE — Progress Notes (Signed)
Called to room by CNA's they were attempting to get patient from bed to chair. Patient had excessively large  brown BM in the process and stated he felt dizzy while sitting on bedside commode. Patient unable to get to chair states he felt bad.  Patient assisted back to bed.

## 2018-02-18 NOTE — Progress Notes (Signed)
BP running low patient asymptomatic Dr. Denton Brick notified.

## 2018-02-18 NOTE — Progress Notes (Signed)
Episodes of confusion/hallucination, may use Haldol  Episodes of hypotension-creatinine trending up, suspect volume depletion, increase IV fluid to 125 mL an hour I am aware of low EF of 30-35%

## 2018-02-18 NOTE — Progress Notes (Signed)
[facility] informed of pt decreased u/o and low b/p, new orders received and initiated.

## 2018-02-18 NOTE — Progress Notes (Signed)
Patient noted to be experiencing some mild hallucinations . Patient asking staff  to kill bugs on the floor that are not there. Explained to patient that those were not bugs  just black spots. Patient states he keeps seeing and speaking to people whom just disappear.  Patient unable to tell me who the person was he was speaking too. Patient denies any alcohol use.  Dr. Theresa Duty notified new orders received

## 2018-02-19 LAB — BASIC METABOLIC PANEL
Anion gap: 8 (ref 5–15)
BUN: 21 mg/dL — AB (ref 6–20)
CHLORIDE: 105 mmol/L (ref 101–111)
CO2: 24 mmol/L (ref 22–32)
Calcium: 7.7 mg/dL — ABNORMAL LOW (ref 8.9–10.3)
Creatinine, Ser: 1.5 mg/dL — ABNORMAL HIGH (ref 0.61–1.24)
GFR calc Af Amer: 53 mL/min — ABNORMAL LOW (ref >60)
GFR calc non Af Amer: 46 mL/min — ABNORMAL LOW (ref >60)
GLUCOSE: 114 mg/dL — AB (ref 65–99)
POTASSIUM: 4 mmol/L (ref 3.5–5.1)
Sodium: 137 mmol/L (ref 135–145)

## 2018-02-19 LAB — MAGNESIUM: Magnesium: 2.1 mg/dL (ref 1.7–2.4)

## 2018-02-19 LAB — CREATININE, FLUID (PLEURAL, PERITONEAL, JP DRAINAGE): Creat, Fluid: 1.3 mg/dL

## 2018-02-19 LAB — PREPARE RBC (CROSSMATCH)

## 2018-02-19 LAB — CBC
HEMATOCRIT: 16 % — AB (ref 39.0–52.0)
Hemoglobin: 5 g/dL — CL (ref 13.0–17.0)
MCH: 28.6 pg (ref 26.0–34.0)
MCHC: 31.3 g/dL (ref 30.0–36.0)
MCV: 91.4 fL (ref 78.0–100.0)
Platelets: 183 10*3/uL (ref 150–400)
RBC: 1.75 MIL/uL — ABNORMAL LOW (ref 4.22–5.81)
RDW: 15.8 % — AB (ref 11.5–15.5)
WBC: 5.5 10*3/uL (ref 4.0–10.5)

## 2018-02-19 LAB — PHOSPHORUS: PHOSPHORUS: 2.6 mg/dL (ref 2.5–4.6)

## 2018-02-19 MED ORDER — SODIUM CHLORIDE 0.9 % IV SOLN
Freq: Once | INTRAVENOUS | Status: AC
  Administered 2018-02-19: 10:00:00 via INTRAVENOUS

## 2018-02-19 MED ORDER — FUROSEMIDE 10 MG/ML IJ SOLN
20.0000 mg | Freq: Once | INTRAMUSCULAR | Status: AC
  Administered 2018-02-19: 20 mg via INTRAVENOUS
  Filled 2018-02-19: qty 2

## 2018-02-19 NOTE — Progress Notes (Signed)
Patient Demographics:    Dean [name], is a 72 y.o. male, DOB - 1946/10/30, ZOX:[id number]  Admit date - 02/17/2018   Admitting Physician Ileana Roup, MD  Outpatient Primary MD for the patient is Dean Hummingbird, PA-C  LOS - 2  No chief complaint on file.       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 In short-term trials with alvimopan, no increased risk of MI was observed.... Continue Coreg as BP allows, prior to admission patient was on Coumadin, anticoagulation on hold, continue aspirin 81 mg daily  3)H/o Gout-apparently  Uloric carries increased cardiac risk  4)Pafib- Continue Coreg as BP allows, prior to admission patient was on Coumadin, anticoagulation on hold, may resume anticoagulation when okay with surgical team, patient has appointment with Dr. Acie Fredrickson on 03/09/2018, EF is 30-35%  5)AKI-creatinine is down to 1.5 from  1.75, patient has developed acute kidney injury, due to transient hypotension and volume depletion in the postop patient, hydrate, avoid nephrotoxic agents, monitor  renal function closely, will be judicious with IV fluids I am aware of low EF of 30-35%  6)Acute on chronic Anemia-hemoglobin is down to 5 from 8.5, suspect due to postop blood loss, JP drain has drained about 550 mL of very bloody material in the last 24 hours, Risk, benefits and alternatives to transfusion of blood products discussed. Indication for transfusion discussed. Consent obtained.  Okay to transfuse 2 units of packed cells with Lasix in between   DVT Prophylaxis- SCDs  Code Status : Full  Disposition Plan  : home  Consults  :  Gen Surgery/Cardiology/Urology  Lab Results  Component Value Date   PLT 183 02/19/2018   Inpatient Medications  Scheduled Meds: . acetaminophen  650 mg Oral Q6H  . alvimopan  12 mg Oral BID  . aspirin EC  81 mg Oral Daily  . carvedilol  3.125 mg Oral BID  . febuxostat  40 mg Oral Q lunch  . furosemide  20 mg Intravenous Once  . gabapentin  300 mg Oral TID  . hydrocortisone   Rectal TID   Continuous Infusions: . sodium chloride Stopped (02/19/18 0956)  . lactated ringers Stopped (02/18/18 2036)   PRN Meds:.diphenhydrAMINE **OR** diphenhydrAMINE, haloperidol lactate, HYDROmorphone (DILAUDID) injection, metoprolol tartrate, ondansetron **OR** ondansetron (ZOFRAN) IV, oxyCODONE   Anti-infectives (From admission, onward)   Start     Dose/Rate Route Frequency Ordered Stop   02/17/18 0542  cefoTEtan in Dextrose 5% (CEFOTAN) IVPB 2 g  2 g Intravenous On call to O.R. 02/17/18 0542 02/17/18 0754   02/17/18 0542  neomycin (MYCIFRADIN) tablet 1,000 mg  Status:  Discontinued     1,000 mg Oral 3 times per day 02/17/18 0542 02/17/18 1546   02/17/18 0542  metroNIDAZOLE (FLAGYL) tablet 1,000 mg  Status:  Discontinued     1,000 mg Oral 3 times per day 02/17/18 0542 02/17/18 1546       Objective:   Vitals:   02/19/18 1300 02/19/18 1400 02/19/18 1430 02/19/18 1508  BP: (!) 122/50 (!) 100/47 (!) 115/46 131/60  Pulse: 64 81 71 68  Resp: 19 (!) 22  18 (!) 21  Temp: 98.2 F (36.8 C)  98.3 F (36.8 C) 98.3 F (36.8 C)  TempSrc: Oral  Oral Oral  SpO2: 99% 100% 100% 99%  Weight:      Height:        Wt Readings from Last 3 Encounters:  02/19/18 72.1 kg (158 lb 15.2 oz)  02/10/18 68.2 kg (150 lb 4 oz)  12/22/17 71.3 kg (157 lb 3.2 oz)     Intake/Output Summary (Last 24 hours) at 02/19/2018 1732 Last data filed at 02/19/2018 1448 Gross per 24 hour  Intake 1581.25 ml  Output 3615 ml  Net -2033.75 ml    Physical Exam Gen:- Awake Alert,  In no apparent distress  HEENT:- [place].AT, No sclera icterus Neck-Supple Neck,No JVD,.  Lungs-  CTAB  CV- S1, S2 normal, CABG scar Abd-  +ve B.Sounds, Abd Soft, appropriate postop tenderness, right lower quadrant JP drain with sanguinous material Extremity/Skin:- No  edema,    Psych-affect is appropriate, oriented x3 (no further hallucination) Neuro-no new focal deficits, no tremors   Data Review:   Micro Results Recent Results (from the past 240 hour(s))  MRSA PCR Screening     Status: None   Collection Time: 02/17/18  5:03 PM  Result Value Ref Range Status   MRSA by PCR NEGATIVE NEGATIVE Final    Comment:        The GeneXpert MRSA Assay (FDA approved for NASAL specimens only), is one component of a comprehensive MRSA colonization surveillance program. It is not intended to diagnose MRSA infection nor to guide or monitor treatment for MRSA infections. Performed at [facility], [place] [address], [place], [place] [address]    Radiology Reports Ir Catheter Tube Change  Result Date: 01/27/2018 INDICATION: 72 year old male with complicated diverticulitis and clinical signs of a colovesicular fistula. He is awaiting surgical repair but having trouble with his percutaneous drainage catheter. The catheter has been in place since 12/02/2017. The patient has skin breakdown around the retention suture from chronic traction. Additionally, the tube may be partially clogged.  EXAM: IR CATHETER TUBE CHANGE MEDICATIONS: None ANESTHESIA/SEDATION: None COMPLICATIONS: None immediate. PROCEDURE: Informed written consent was obtained from the patient after a thorough discussion of the procedural risks, benefits and alternatives. All questions were addressed. Maximal Sterile Barrier Technique was utilized including caps, mask, sterile gowns, sterile gloves, sterile drape, hand hygiene and skin antiseptic. A timeout was performed prior to the initiation of the procedure. A gentle hand injection of contrast material was performed. There is immediate passage of contrast material from the drainage catheter into the urinary bladder. No communication with the sigmoid colon. The distal aspect of the drainage catheter is partially occluded. The retention suture was cut and removed. The catheter was transected and removed over a short Amplatz wire. A new 10 [place] Flexima all-purpose drainage catheter was advanced  over the wire and formed. The catheter was secured to the skin with an adhesive fixation device. No retention suture was placed. IMPRESSION: 1. Fistulous communication between the bladder and the percutaneous drainage catheter. 2. Successful exchange for a new 10 French percutaneous drain. 3. Instructed patient to apply Neosporin or triple antibiotic ointment to the area of skin breakdown for the next few days to facilitate healing. 4. Patient has surgery scheduled in approximately 3 weeks. If he cannot tolerate the presence of the drainage catheter, it could conceivably be removed the week prior to surgery. 5. Signed, Criselda Peaches, MD [facility] Radiology Electronically Signed   By: Jacqulynn Cadet M.D.   On: 01/27/2018 17:24   Dg C-arm 1-60 Min-no Report  Result Date: 02/17/2018 Fluoroscopy was utilized by the requesting physician.  No radiographic interpretation.    CBC Recent Labs  Lab 02/17/18 1018 02/17/18 1624  02/18/18 0330 02/19/18 0305  WBC  --  6.1 8.6 5.5  HGB 9.2* 9.5* 8.5* 5.0*  HCT 27.0* 28.9* 26.4* 16.0*  PLT  --  161 299 183  MCV  --  89.8 90.1 91.4  MCH  --  29.5 29.0 28.6  MCHC  --  32.9 32.2 31.3  RDW  --  14.8 15.2 15.8*   Chemistries  Recent Labs  Lab 02/17/18 1018 02/17/18 1624 02/18/18 0330 02/19/18 0305  NA 138  --  137 137  K 3.3*  --  4.5 4.0  CL  --   --  105 105  CO2  --   --  21* 24  GLUCOSE  --   --  158* 114*  BUN  --   --  20 21*  CREATININE  --  1.26* 1.74* 1.50*  CALCIUM  --   --  8.1* 7.7*  MG  --   --  1.4* 2.1   ------------------------------------------------------------------------------------------------------------------ No results for input(s): CHOL, HDL, LDLCALC, TRIG, CHOLHDL, LDLDIRECT in the last 72 hours.  Lab Results  Component Value Date   HGBA1C 5.8 (H) 10/25/2017   ------------------------------------------------------------------------------------------------------------------ No results for input(s): TSH, T4TOTAL, T3FREE, THYROIDAB in the last 72 hours.  Invalid input(s): FREET3 ------------------------------------------------------------------------------------------------------------------ No results for input(s): VITAMINB12, FOLATE, FERRITIN, TIBC, IRON, RETICCTPCT in the last 72 hours.  Coagulation profile Recent Labs  Lab 02/17/18 0610 02/17/18 0838 02/18/18 0330  INR 1.79 1.85 1.41    No results for input(s): DDIMER in the last 72 hours.  Cardiac Enzymes No results for input(s): CKMB, TROPONINI, MYOGLOBIN in the last 168 hours.  Invalid input(s): CK ------------------------------------------------------------------------------------------------------------------ No results found for: BNP   Roxan Hockey M.D on 02/19/2018 at 5:32 PM  Between 7am to 7pm - Pager - [phone]  After 7pm go to [email or web address] - password [email or web address]  [facility] -  Office  [phone]   Voice Recognition Viviann Spare dictation  system was used to create this note, attempts have been made to correct errors. Please contact the author with questions and/or clarifications.

## 2018-02-19 NOTE — Progress Notes (Signed)
PT Cancellation Note  Patient Details Name: Dean [name] MRN: [id number] DOB: September 08, 1946   Cancelled Treatment:    Reason Eval/Treat Not Completed: Medical issues which prohibited therapy: hgb 5.0-pt to receive transfusion. Will hold PT for now and check back another time. Thanks.    Weston Anna, MPT Pager: [phone]

## 2018-02-19 NOTE — Evaluation (Signed)
Physical Therapy Evaluation Patient Details Name: Dean [name] MRN: [id number] DOB: 1946/07/18 Today's Date: 02/19/2018   History of Present Illness  60yoM with hx of CAD (s/p CABGx4 11/02/17), ischemic cardiomyopathy (LVEF 30%), gout, afib, asymptomatic nephrolithiasis,  perforated diverticulitis with abscess requiring percutaneous drain placement. On 02/17/18 underwent Laparoscopic low anterior resection with double stapled colorectal anastomosis (coloproctostomy), takedown of colovesical fistula, takedown of colocutaneous fistula, repair of cystotomy (colovesical fistula)    Clinical Impression  On eval, pt required Min assist +2 for safety/equipment. He performed a stand pivot and took a few steps around room before c/o dizziness. Deferred further ambulation and assisted pt back to bed. He is set to receive another unit blood. He mobilizes fairly well but is limited primarily by dizziness and some pain. Will follow and progress activity as safely able. Will recommend SNF for now and update recommendations if pt progresses well.     Follow Up Recommendations SNF(unless pt progress well enough to consider d/c home)    Equipment Recommendations  None recommended by PT    Recommendations for Other Services       Precautions / Restrictions Precautions Precautions: Fall Precaution Comments: abdominal incisions, bulb drain, Urethral Catheter  Restrictions Weight Bearing Restrictions: No      Mobility  Bed Mobility Overal bed mobility: Needs Assistance Bed Mobility: Sit to Supine       Sit to supine: Min guard   General bed mobility comments: close guard for safety, lines  Transfers Overall transfer level: Needs assistance   Transfers: Sit to/from Stand Sit to Stand: Min guard Stand pivot transfers: Min guard       General transfer comment: close guard for safety.   Ambulation/Gait Ambulation/Gait assistance: Min assist; +2 safety/equipment  Ambulation Distance  (Feet): 3 Feet Assistive device: None Gait Pattern/deviations: Step-through pattern     General Gait Details: Began to attempt ambulation but deferred due to onset of dizziness and pt request to sit down.   Stairs            Wheelchair Mobility    Modified Rankin (Stroke Patients Only)       Balance             Standing balance-Leahy Scale: Fair                               Pertinent Vitals/Pain Pain Assessment: 0-10 Pain Score: 6  Pain Location: abdomen  Pain Descriptors / Indicators: Grimacing;Sore Pain Intervention(s): Limited activity within patient's tolerance;Repositioned    Home Living Family/patient expects to be discharged to:: Unsure Living Arrangements: Alone Available Help at Discharge: Neighbor Type of Home: Apartment Home Access: Stairs to enter Entrance Stairs-Rails: Can reach both Entrance Stairs-Number of Steps: 3 flights Home Layout: One level Home Equipment: None Additional Comments: Says a neighbor can A him 1 hour per day    Prior Function Level of Independence: Independent               Hand Dominance   Dominant Hand: Right    Extremity/Trunk Assessment   Upper Extremity Assessment Upper Extremity Assessment: Defer to OT evaluation    Lower Extremity Assessment Lower Extremity Assessment: Generalized weakness       Communication   Communication: No difficulties  Cognition Arousal/Alertness: Awake/alert Behavior During Therapy: WFL for tasks assessed/performed Overall Cognitive Status: Within Functional Limits for tasks assessed  General Comments: pt reports he has had hallucinations      General Comments      Exercises     Assessment/Plan    PT Assessment Patient needs continued PT services  PT Problem List Decreased mobility;Decreased balance;Pain;Decreased activity tolerance       PT Treatment Interventions Gait training;DME  instruction;Functional mobility training;Therapeutic activities;Patient/family education;Balance training;Stair training    PT Goals (Current goals can be found in the Care Plan section)  Acute Rehab PT Goals Patient Stated Goal: to go home PT Goal Formulation: With patient Time For Goal Achievement: 03/05/18 Potential to Achieve Goals: Good    Frequency Min 3X/week   Barriers to discharge        Co-evaluation               AM-PAC PT "6 Clicks" Daily Activity  Outcome Measure Difficulty turning over in bed (including adjusting bedclothes, sheets and blankets)?: A Little Difficulty moving from lying on back to sitting on the side of the bed? : A Little Difficulty sitting down on and standing up from a chair with arms (e.g., wheelchair, bedside commode, etc,.)?: A Little Help needed moving to and from a bed to chair (including a wheelchair)?: A Little Help needed walking in hospital room?: A Little Help needed climbing 3-5 steps with a railing? : A Little 6 Click Score: 18    End of Session   Activity Tolerance: (limited by dizziness) Patient left: in bed;with call bell/phone within reach;with bed alarm set   PT Visit Diagnosis: Difficulty in walking, not elsewhere classified (R26.2);Pain;Other abnormalities of gait and mobility (R26.89) Pain - part of body: (abdomen post op)    Time: 1350-1406 PT Time Calculation (min) (ACUTE ONLY): 16 min   Charges:   PT Evaluation $PT Eval Moderate Complexity: 1 Mod     PT G Codes:          Weston Anna, MPT Pager: [phone]

## 2018-02-19 NOTE — Progress Notes (Signed)
Occupational Therapy Treatment Patient Details Name: Dean [name] MRN: [id number] DOB: 03/28/1946 Today's Date: 02/19/2018    History of present illness  Mr. Cordts is a pleasant [phone] with hx of CAD (s/p CABGx4 11/02/17), ischemic cardiomyopathy (LVEF 30%), gout, afib, asymptomatic nephrolithiasis,  perforated diverticulitis with abscess requiring percutaneous drain placement. On 02/17/18 underwent Laparoscopic low anterior resection with double stapled colorectal anastomosis (coloproctostomy), takedown of colovesical fistula, takedown of colocutaneous fistula, repair of cystotomy (colovesical fistula)   OT comments  Pt demonstrates ability to reach bil LE by crossing for LB adls. Pt beginning to question d/c home to therapist. Pt self reporting the hallucinations are resolved and he misses hearing the stories. Pt reports "I am not doing anything that is routine only what is necessary. Look at my arms! They are so bruised. I am hoping they will let me go this afternoon." Pt advised of pending transfusion and to speak with his nurse regarding hgb values further.    Follow Up Recommendations  SNF;Supervision/Assistance - 24 hour    Equipment Recommendations  3 in 1 bedside commode;Tub/shower seat    Recommendations for Other Services      Precautions / Restrictions Precautions Precautions: Fall Precaution Comments: abdominal incisions, bulb drain, Urethral Catheter  Restrictions Weight Bearing Restrictions: No       Mobility Bed Mobility               General bed mobility comments: in chair on arrival  Transfers                 General transfer comment: not assessed per RN request with pending blood transfusion    Balance                                           ADL either performed or assessed with clinical judgement   ADL Overall ADL's : Needs assistance/impaired Eating/Feeding: Modified independent   Grooming: Wash/dry face Grooming  Details (indicate cue type and reason): washing face brushing teeth in chair at this time Upper Body Bathing: Set up             Lower Body Dressing Details (indicate cue type and reason): pt is able to cross bil le and touch feet. pt will not require AE education at this time.                General ADL Comments: session was chair level due to hgb 5 at this time and RN requesting to keep him in the chair static sitting. PT edcuated on this prior to session starting. pt verbalized difficulty reading his book Vision assessed     Vision   Vision Assessment?: Yes Eye Alignment: Within Functional Limits Ocular Range of Motion: Within Functional Limits Alignment/Gaze Preference: Within Defined Limits Tracking/Visual Pursuits: Requires cues, head turns, or add eye shifts to track Saccades: Within functional limits Convergence: Within functional limits Additional Comments: pt demonstrates over shooting with tracking and then returning to the object to fixate. Pt reports "it is work to read" pt able to scan menu and immediately locate phone number for dining services. pt states "i have read this whole thing no problem, its my book" pt reports no trouble with retaining content read. Question if patient is ability to read a paragraph and retain the information.   Perception     Praxis      Cognition Arousal/Alertness:  Awake/alert Behavior During Therapy: Flat affect Overall Cognitive Status: No family/caregiver present to determine baseline cognitive functioning                                 General Comments: pt reports his hallunications are gone and he misses them. "i very much enjoyed the stories being read to me" pt reports "is it normal to not know what is reality and hallunications now?" pt states he also would like to know when he can start to think about going home. pt demonstrates some lack of awareness to deficits. Ot explaining in depth the current decr  hemoglobin        Exercises     Shoulder Instructions       General Comments advised on pressure relief in chair to prevent wound and skin break down. Pt able to shift with Bil UB at this time    Pertinent Vitals/ Pain       Pain Assessment: No/denies pain  Home Living                                          Prior Functioning/Environment              Frequency  Min 2X/week        Progress Toward Goals  OT Goals(current goals can now be found in the care plan section)  Progress towards OT goals: Progressing toward goals  Acute Rehab OT Goals Patient Stated Goal: to go home OT Goal Formulation: With patient Time For Goal Achievement: 03/04/18 Potential to Achieve Goals: Good ADL Goals Pt Will Perform Grooming: with set-up;with supervision;standing(seate d this session) Pt Will Perform Lower Body Bathing: with set-up;with supervision;with adaptive equipment;sit to/from stand Pt Will Perform Lower Body Dressing: with supervision;with set-up;with adaptive equipment;sit to/from stand Pt Will Transfer to Toilet: with supervision;ambulating;regular height toilet;grab bars Pt Will Perform Toileting - Clothing Manipulation and hygiene: with supervision;sit to/from stand Additional ADL Goal #1: Pt will be able to get OOB and manage his foley and drain for basic ADLs with S  Plan Discharge plan remains appropriate    Co-evaluation                 AM-PAC PT "6 Clicks" Daily Activity     Outcome Measure   Help from another person eating meals?: None Help from another person taking care of personal grooming?: A Little Help from another person toileting, which includes using toliet, bedpan, or urinal?: A Little Help from another person bathing (including washing, rinsing, drying)?: A Little Help from another person to put on and taking off regular upper body clothing?: A Little Help from another person to put on and taking off regular lower body  clothing?: A Little 6 Click Score: 19    End of Session    OT Visit Diagnosis: Unsteadiness on feet (R26.81);Pain;Other symptoms and signs involving cognitive function;Muscle weakness (generalized) (M62.81)   Activity Tolerance Patient tolerated treatment well   Patient Left in chair;with call bell/phone within reach   Nurse Communication Mobility status;Precautions        Time: 4268-3419 OT Time Calculation (min): 21 min  Charges: OT General Charges $OT Visit: 1 Visit OT Treatments $Self Care/Home Management : 8-22 mins   Jeri Modena   OTR/L Pager: [phone] Office: [phone] .    Ronnald Ramp,  Steffanie Dunn 02/19/2018, 9:10 AM

## 2018-02-19 NOTE — Progress Notes (Signed)
Patient ID: Dean [name], male   DOB: 05-May-1946, 72 y.o.   MRN: [id number]  2 Days Post-Op Subjective: No new complaints. Hgb 5.0 this morning and blood transfusion in in process.  Objective: Vital signs in last 24 hours: Temp:  [97.4 F (36.3 C)-97.9 F (36.6 C)] 97.6 F (36.4 C) (03/17 1200) Pulse Rate:  [58-74] 65 (03/17 1200) Resp:  [12-25] 17 (03/17 1200) BP: (77-135)/(42-68) 135/68 (03/17 1200) SpO2:  [78 %-100 %] 100 % (03/17 1200) Weight:  [71.8 kg (158 lb 4.6 oz)-72.1 kg (158 lb 15.2 oz)] 72.1 kg (158 lb 15.2 oz) (03/17 0310)  Intake/Output from previous day: 03/16 0701 - 03/17 0700 In: 1817.1 [P.O.:240; I.V.:1577.1] Out: 2402 [Urine:1877; Drains:525] Intake/Output this shift: Total I/O In: -  Out: 565 [Urine:475; Drains:90]  Physical Exam:  General: Alert and oriented GU: Urine clear  Lab Results: Recent Labs    02/17/18 1624 02/18/18 0330 02/19/18 0305  HGB 9.5* 8.5* 5.0*  HCT 28.9* 26.4* 16.0*   BMET Recent Labs    02/18/18 0330 02/19/18 0305  NA 137 137  K 4.5 4.0  CL 105 105  CO2 21* 24  GLUCOSE 158* 114*  BUN 20 21*  CREATININE 1.74* 1.50*  CALCIUM 8.1* 7.7*     Studies/Results: No results found.  Assessment/Plan: Colovesical fistula s/p repair - Ordered drain Cr level - Continue Foley for 10-14 days with plans for outpatient cystogram   LOS: 2 days   Laurinda Carreno,LES 02/19/2018, 1:05 PM

## 2018-02-19 NOTE — Progress Notes (Signed)
General Surgery [facility]  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       Office: [phone]    Chief Complaint: Colovesical fistula, diverticular disease  Subjective: Patient up in chair, pleasant.  No complaints.  Liquid watery BM's.  No sign active bleeding.  Objective: Vital signs in last 24 hours: Temp:  [97.4 F (36.3 C)-97.9 F (36.6 C)] 97.9 F (36.6 C) (03/17 0340) Pulse Rate:  [58-79] 74 (03/16 2034) Resp:  [12-25] 20 (03/17 0700) BP: (77-109)/(42-65) 104/42 (03/17 0700) SpO2:  [78 %-100 %] 95 % (03/16 2000) Weight:  [71.8 kg (158 lb 4.6 oz)-72.1 kg (158 lb 15.2 oz)] 72.1 kg (158 lb 15.2 oz) (03/17 0310) Last BM Date: 02/18/18  Intake/Output from previous day: 03/16 0701 - 03/17 0700 In: 1817.1 [P.O.:240; I.V.:1577.1] Out: 2402 [Urine:1877; Drains:525] Intake/Output this shift: Total I/O In: -  Out: 305 [Urine:275; Drains:30]  Physical Exam: HEENT - sclerae clear, mucous membranes moist Neck - soft Chest - clear bilaterally Cor - RRR Abdomen - soft, mild distension; active BS present; JP with serosanguinous; dressings dry Ext - no edema, non-tender Neuro - no focal deficits  Lab Results:  Recent Labs    02/18/18 0330 02/19/18 0305  WBC 8.6 5.5  HGB 8.5* 5.0*  HCT 26.4* 16.0*  PLT 299 183   BMET Recent Labs    02/18/18 0330 02/19/18 0305  NA 137 137  K 4.5 4.0  CL 105 105   CO2 21* 24  GLUCOSE 158* 114*  BUN 20 21*  CREATININE 1.74* 1.50*  CALCIUM 8.1* 7.7*   PT/INR Recent Labs    02/17/18 0838 02/18/18 0330  LABPROT 21.2* 17.1*  INR 1.85 1.41   Comprehensive Metabolic Panel:    Component Value Date/Time   NA 137 02/19/2018 0305   NA 137 02/18/2018 0330   NA 139 12/22/2017 1225   NA 139 09/02/2017 1335   K 4.0 02/19/2018 0305   K 4.5 02/18/2018 0330   CL 105 02/19/2018 0305   CL 105 02/18/2018 0330   CO2 24 02/19/2018 0305   CO2 21 (L) 02/18/2018 0330   BUN 21 (H) 02/19/2018 0305   BUN 20 02/18/2018 0330   BUN 18 12/22/2017 1225   BUN 16 09/02/2017 1335   CREATININE 1.50 (H) 02/19/2018 0305   CREATININE 1.74 (H) 02/18/2018 0330   GLUCOSE 114 (H) 02/19/2018 0305   GLUCOSE 158 (  H) 02/18/2018 0330   CALCIUM 7.7 (L) 02/19/2018 0305   CALCIUM 8.1 (L) 02/18/2018 0330   AST 16 11/26/2017 0812   AST 19 10/22/2017 0838   ALT 11 (L) 11/26/2017 0812   ALT 7 (L) 10/22/2017 0838   ALKPHOS 75 11/26/2017 0812   ALKPHOS 58 10/22/2017 0838   BILITOT 0.7 11/26/2017 0812   BILITOT 0.6 10/22/2017 0838   PROT 7.0 11/26/2017 0812   PROT 7.1 10/22/2017 0838   ALBUMIN 2.8 (L) 11/26/2017 0812   ALBUMIN 3.1 (L) 10/22/2017 0838    Studies/Results: Dg C-arm 1-60 Min-no Report  Result Date: 02/17/2018 Fluoroscopy was utilized by the requesting physician.  No radiographic interpretation.      Dean [name] 02/19/2018  Patient ID: Dean [name], male   DOB: November 02, 1946, 72 y.o.   MRN: [id number]

## 2018-02-19 NOTE — Plan of Care (Signed)
Patient has had moderate output from JP drain.  BP has remained low but stable in the low 80's/40's.  He did get light headed with ambulation to bedside commode first time but felt better second time.  He received one bolus of fluids on shift to help improve BP but remained much the same but Urine output improved.

## 2018-02-20 DIAGNOSIS — N321 Vesicointestinal fistula: Principal | ICD-10-CM

## 2018-02-20 LAB — PHOSPHORUS: Phosphorus: 1.7 mg/dL — ABNORMAL LOW (ref 2.5–4.6)

## 2018-02-20 LAB — BASIC METABOLIC PANEL
Anion gap: 6 (ref 5–15)
BUN: 15 mg/dL (ref 6–20)
CALCIUM: 8.1 mg/dL — AB (ref 8.9–10.3)
CHLORIDE: 106 mmol/L (ref 101–111)
CO2: 29 mmol/L (ref 22–32)
CREATININE: 1.16 mg/dL (ref 0.61–1.24)
GFR calc Af Amer: >60 mL/min (ref >60)
GFR calc non Af Amer: >60 mL/min (ref >60)
Glucose, Bld: 100 mg/dL — ABNORMAL HIGH (ref 65–99)
Potassium: 3.6 mmol/L (ref 3.5–5.1)
Sodium: 141 mmol/L (ref 135–145)

## 2018-02-20 LAB — BPAM RBC
BLOOD PRODUCT EXPIRATION DATE: 201904012359
Blood Product Expiration Date: 201904012359
ISSUE DATE / TIME: 201903171000
ISSUE DATE / TIME: 201903171441
Unit Type and Rh: 6200
Unit Type and Rh: 6200

## 2018-02-20 LAB — CBC
HCT: 24.1 % — ABNORMAL LOW (ref 39.0–52.0)
Hemoglobin: 7.8 g/dL — ABNORMAL LOW (ref 13.0–17.0)
MCH: 29.1 pg (ref 26.0–34.0)
MCHC: 32.4 g/dL (ref 30.0–36.0)
MCV: 89.9 fL (ref 78.0–100.0)
PLATELETS: 210 10*3/uL (ref 150–400)
RBC: 2.68 MIL/uL — AB (ref 4.22–5.81)
RDW: 16.4 % — ABNORMAL HIGH (ref 11.5–15.5)
WBC: 5.8 10*3/uL (ref 4.0–10.5)

## 2018-02-20 LAB — TYPE AND SCREEN
ABO/RH(D): A POS
Antibody Screen: NEGATIVE
Unit division: 0
Unit division: 0

## 2018-02-20 LAB — MAGNESIUM: MAGNESIUM: 1.7 mg/dL (ref 1.7–2.4)

## 2018-02-20 LAB — HEMOGLOBIN AND HEMATOCRIT, BLOOD
HEMATOCRIT: 25.4 % — AB (ref 39.0–52.0)
Hemoglobin: 8.3 g/dL — ABNORMAL LOW (ref 13.0–17.0)

## 2018-02-20 MED ORDER — K PHOS MONO-SOD PHOS DI & MONO 155-852-130 MG PO TABS
500.0000 mg | ORAL_TABLET | Freq: Three times a day (TID) | ORAL | Status: DC
  Administered 2018-02-20 – 2018-02-22 (×7): 500 mg via ORAL
  Filled 2018-02-20 (×9): qty 2

## 2018-02-20 MED ORDER — MAGNESIUM SULFATE 2 GM/50ML IV SOLN
2.0000 g | Freq: Once | INTRAVENOUS | Status: AC
  Administered 2018-02-20: 2 g via INTRAVENOUS
  Filled 2018-02-20: qty 50

## 2018-02-20 MED ORDER — POTASSIUM PHOSPHATE MONOBASIC 500 MG PO TABS
500.0000 mg | ORAL_TABLET | Freq: Once | ORAL | Status: AC
  Administered 2018-02-20: 500 mg via ORAL
  Filled 2018-02-20: qty 1

## 2018-02-20 NOTE — Progress Notes (Signed)
Physical Therapy Treatment Patient Details Name: Dean [name] MRN: [id number] DOB: 01/17/1946 Today's Date: 02/20/2018    History of Present Illness 37yoM with hx of CAD (s/p CABGx4 11/02/17), ischemic cardiomyopathy (LVEF 30%), gout, afib, asymptomatic nephrolithiasis,  perforated diverticulitis with abscess requiring percutaneous drain placement. On 02/17/18 underwent Laparoscopic low anterior resection with double stapled colorectal anastomosis (coloproctostomy), takedown of colovesical fistula, takedown of colocutaneous fistula, repair of cystotomy (colovesical fistula)    PT Comments    Pt  Doingwell; no further needs; PT will sign off   Follow Up Recommendations  No PT follow up     Equipment Recommendations  None recommended by PT    Recommendations for Other Services       Precautions / Restrictions Precautions Precautions: Other (comment) Precaution Comments: abd incisions, JP drain Restrictions Weight Bearing Restrictions: No    Mobility  Bed Mobility               General bed mobility comments: NT  Transfers   Equipment used: None Transfers: Sit to/from Stand Sit to Stand: Independent            Ambulation/Gait Ambulation/Gait assistance: Modified independent (Device/Increase time);Independent Ambulation Distance (Feet): 380 Feet(15' more) Assistive device: None Gait Pattern/deviations: Step-through pattern     General Gait Details: no LOB, no AD needed; pt able to carry portable monitor while amb   Stairs            Wheelchair Mobility    Modified Rankin (Stroke Patients Only)       Balance             Standing balance-Leahy Scale: Good               High level balance activites: Direction changes;Turns;Head turns;Sudden stops;Backward walking(no LOb with above)              Cognition Arousal/Alertness: Awake/alert Behavior During Therapy: WFL for tasks assessed/performed Overall Cognitive Status: Within  Functional Limits for tasks assessed                                 General Comments: pt reports he has had hallucinations      Exercises      General Comments        Pertinent Vitals/Pain Pain Assessment: No/denies pain    Home Living                      Prior Function            PT Goals (current goals can now be found in the care plan section) Acute Rehab PT Goals Patient Stated Goal: to go home PT Goal Formulation: With patient Time For Goal Achievement: 03/05/18 Potential to Achieve Goals: Good Progress towards PT goals: Goals met/education completed, patient discharged from PT    Frequency           PT Plan Current plan remains appropriate    Co-evaluation              AM-PAC PT "6 Clicks" Daily Activity  Outcome Measure  Difficulty turning over in bed (including adjusting bedclothes, sheets and blankets)?: None Difficulty moving from lying on back to sitting on the side of the bed? : None Difficulty sitting down on and standing up from a chair with arms (e.g., wheelchair, bedside commode, etc,.)?: None Help needed moving to and from a bed to chair (including a  wheelchair)?: None Help needed walking in hospital room?: None Help needed climbing 3-5 steps with a railing? : None 6 Click Score: 24    End of Session   Activity Tolerance: Patient tolerated treatment well Patient left: in chair;with call bell/phone within reach   PT Visit Diagnosis: Difficulty in walking, not elsewhere classified (R26.2);Pain;Other abnormalities of gait and mobility (R26.89)     Time: 5423-7023 PT Time Calculation (min) (ACUTE ONLY): 13 min  Charges:  $Gait Training: 8-22 mins                    G CodesKenyon [name], PT Pager: [phone] 02/20/2018    Dean [name] 02/20/2018, 1:46 PM

## 2018-02-20 NOTE — Progress Notes (Signed)
JP creatinine consistent with serum, no evidence of urine leak.  OK to remove JP from GU prospective.  I have arranged f/u.  Will plan a VT in clinic with cystogram prior.

## 2018-02-20 NOTE — Progress Notes (Signed)
Patient Demographics:    Dean [name], is a 72 y.o. male, DOB - October 30, 1946, AVW:[id number]  Admit date - 02/17/2018   Admitting Physician Ileana Roup, MD  Outpatient Primary MD for the patient is [name], PA-C  LOS - 3  No chief complaint on file.       Subjective:    Dean [name] today has no fevers, no emesis,  No chest pain, no further hallucinations, no further dizziness, eating and drinking well,  Assessment  & Plan :    Active Problems:   Atrial fibrillation (HCC) [I48.91]   Diverticulitis   H/O colectomy   Colovesical fistula   AKI (acute kidney injury) ([place])    1)H/o Colovesical fistula - S/p repair 02/17/2018 by urology, general surgery and urology following, Foley catheter to stay in place for 10-14 days, patient would need outpatient  VT in clinic with cystogram prior to removal of Foley catheter, right lower quadrant JP drain creatinines consistent with seroma rather than urine, no evidence of urinary leak  2)CAD/s/p CABG- on Alvimopan (Entereg),  There was a greater incidence of myocardial infarction (MI) in alvimopan-treated patients compared to placebo-treated patients in a 61-month clinical trial, although a causal relationship has not been established. In short-term trials with alvimopan, no increased risk of MI was observed.... Continue Coreg as BP allows, prior to admission patient was on Coumadin, anticoagulation on hold, continue aspirin 81 mg daily  3)H/o Gout-apparently  Uloric carries increased cardiac risk  4)Pafib- Continue Coreg as BP allows, prior to admission patient was on Coumadin, anticoagulation on hold, may resume anticoagulation when okay with surgical team, patient has appointment with Dr. Acie Fredrickson on 03/09/2018, EF is 30-35%  5)Acute kidney injury-not present on admission, now resolved, creatinine is down to 1. 1 from  1.75,  AKi was due to transient  hypotension and volume depletion in the postop patient, hydrate, avoid nephrotoxic agents, monitor renal function closely,STOP  IV fluids I am aware of low EF of 30-35%  6)Acute on chronic Anemia-hemoglobin is  Up to 8.3 from 5 after transfusion of 2 units of packed cells on 02/19/2018,  suspect due to postop blood loss, JP drain has drained only 150 ml in last 24 hours compared to 550 mL of very bloody material in the previous 24 hours,   DVT Prophylaxis- SCDs  Code Status : Full  Disposition Plan  : home  Consults  :  Gen Surgery/Cardiology/Urology  Lab Results  Component Value Date   PLT 210 02/20/2018   Inpatient Medications  Scheduled Meds: . acetaminophen  650 mg Oral Q6H  . aspirin EC  81 mg Oral Daily  . carvedilol  3.125 mg Oral BID  . febuxostat  40 mg Oral Q lunch  . gabapentin  300 mg Oral TID  . hydrocortisone   Rectal TID  . phosphorus  500 mg Oral TID BM   Continuous Infusions: . sodium chloride Stopped (02/19/18 0956)  . lactated ringers 50 mL/hr at 02/20/18 1054   PRN Meds:.diphenhydrAMINE **OR** diphenhydrAMINE, haloperidol lactate, HYDROmorphone (DILAUDID) injection, metoprolol tartrate, ondansetron **OR** ondansetron (ZOFRAN) IV, oxyCODONE   Anti-infectives (From admission, onward)   Start     Dose/Rate Route Frequency Ordered Stop   02/17/18 0542  cefoTEtan in Dextrose  5% (CEFOTAN) IVPB 2 g     2 g Intravenous On call to O.R. 02/17/18 0542 02/17/18 0754   02/17/18 0542  neomycin (MYCIFRADIN) tablet 1,000 mg  Status:  Discontinued     1,000 mg Oral 3 times per day 02/17/18 0542 02/17/18 1546   02/17/18 0542  metroNIDAZOLE (FLAGYL) tablet 1,000 mg  Status:  Discontinued     1,000 mg Oral 3 times per day 02/17/18 0542 02/17/18 1546       Objective:   Vitals:   02/20/18 1055 02/20/18 1200 02/20/18 1400 02/20/18 1500  BP: 138/79 (!) 123/57 129/61 (!) 143/65  Pulse:    (!) 59  Resp: 17 (!) 22 20 20   Temp:    98.4 F (36.9 C)  TempSrc:    Oral    SpO2:    100%  Weight:      Height:        Wt Readings from Last 3 Encounters:  02/20/18 77.5 kg (170 lb 13.7 oz)  02/10/18 68.2 kg (150 lb 4 oz)  12/22/17 71.3 kg (157 lb 3.2 oz)     Intake/Output Summary (Last 24 hours) at 02/20/2018 1822 Last data filed at 02/20/2018 1528 Gross per 24 hour  Intake 1490 ml  Output 2845 ml  Net -1355 ml    Physical Exam Gen:- Awake Alert,  In no apparent distress  HEENT:- [place].AT, No sclera icterus Neck-Supple Neck,No JVD,.  Lungs-  CTAB  CV- S1, S2 normal, CABG scar Abd-  +ve B.Sounds, Abd Soft, appropriate postop tenderness, right lower quadrant JP drain with much less amount of sero- sanguinous material Extremity/Skin:- No  edema,    Psych-affect is appropriate, oriented x3 (no further hallucination) Neuro-no new focal deficits, no tremors GU-Foley with clear urine   Data Review:   Micro Results Recent Results (from the past 240 hour(s))  MRSA PCR Screening     Status: None   Collection Time: 02/17/18  5:03 PM  Result Value Ref Range Status   MRSA by PCR NEGATIVE NEGATIVE Final    Comment:        The GeneXpert MRSA Assay (FDA approved for NASAL specimens only), is one component of a comprehensive MRSA colonization surveillance program. It is not intended to diagnose MRSA infection nor to guide or monitor treatment for MRSA infections. Performed at [facility], [place] [address], [place], [place]  [address]    Radiology Reports Ir Catheter Tube Change  Result Date: 01/27/2018 INDICATION: 72 year old male with complicated diverticulitis and clinical signs of a colovesicular fistula. He is awaiting surgical repair but having trouble with his percutaneous drainage catheter. The catheter has been in place since 12/02/2017. The patient has skin breakdown around the retention suture from chronic traction. Additionally, the tube may be partially clogged. EXAM: IR CATHETER TUBE CHANGE MEDICATIONS: None  ANESTHESIA/SEDATION: None COMPLICATIONS: None immediate. PROCEDURE: Informed written consent was obtained from the patient after a thorough discussion of the procedural risks, benefits and alternatives. All questions were addressed. Maximal Sterile Barrier Technique was utilized including caps, mask, sterile gowns, sterile gloves, sterile drape, hand hygiene and skin antiseptic. A timeout was performed prior to the initiation of the procedure. A gentle hand injection of contrast material was performed. There is immediate passage of contrast material from the drainage catheter into the urinary bladder. No communication with the sigmoid colon. The distal aspect of the drainage catheter is partially occluded. The retention suture was cut and removed. The catheter was transected and removed over a short  Amplatz wire. A new 10 [place] Flexima all-purpose drainage catheter was advanced over the wire and formed. The catheter was secured to the skin with an adhesive fixation device. No retention suture was placed. IMPRESSION: 1. Fistulous communication between the bladder and the percutaneous drainage catheter. 2. Successful exchange for a new 10 French percutaneous drain. 3. Instructed patient to apply Neosporin or triple antibiotic ointment to the area of skin breakdown for the next few days to facilitate healing. 4. Patient has surgery scheduled in approximately 3 weeks. If he cannot tolerate the presence of the drainage catheter, it could conceivably be removed the week prior to surgery. 5. Signed, Criselda Peaches, MD [facility] Radiology Electronically Signed   By: Jacqulynn Cadet M.D.   On: 01/27/2018 17:24   Dg C-arm 1-60 Min-no Report  Result Date: 02/17/2018 Fluoroscopy was utilized by the requesting physician.  No radiographic interpretation.    CBC Recent Labs  Lab 02/17/18 1624 02/18/18 0330 02/19/18 0305 02/20/18 0356 02/20/18 1428  WBC 6.1  8.6 5.5 5.8  --   HGB 9.5* 8.5* 5.0* 7.8* 8.3*  HCT 28.9* 26.4* 16.0* 24.1* 25.4*  PLT 161 299 183 210  --   MCV 89.8 90.1 91.4 89.9  --   MCH 29.5 29.0 28.6 29.1  --   MCHC 32.9 32.2 31.3 32.4  --   RDW 14.8 15.2 15.8* 16.4*  --    Chemistries  Recent Labs  Lab 02/17/18 1018 02/17/18 1624 02/18/18 0330 02/19/18 0305 02/20/18 0356  NA 138  --  137 137 141  K 3.3*  --  4.5 4.0 3.6  CL  --   --  105 105 106  CO2  --   --  21* 24 29  GLUCOSE  --   --  158* 114* 100*  BUN  --   --  20 21* 15  CREATININE  --  1.26* 1.74* 1.50* 1.16  CALCIUM  --   --  8.1* 7.7* 8.1*  MG  --   --  1.4* 2.1 1.7   ------------------------------------------------------------------------------------------------------------------ No results for input(s): CHOL, HDL, LDLCALC, TRIG, CHOLHDL, LDLDIRECT in the last 72 hours.  Lab Results  Component Value Date   HGBA1C 5.8 (H) 10/25/2017   ------------------------------------------------------------------------------------------------------------------ No results for input(s): TSH, T4TOTAL, T3FREE, THYROIDAB in the last 72 hours.  Invalid input(s): FREET3 ------------------------------------------------------------------------------------------------------------------ No results for input(s): VITAMINB12, FOLATE, FERRITIN, TIBC, IRON, RETICCTPCT in the last 72 hours.  Coagulation profile Recent Labs  Lab 02/17/18 0610 02/17/18 0838 02/18/18 0330  INR 1.79 1.85 1.41    No results for input(s): DDIMER in the last 72 hours.  Cardiac Enzymes No results for input(s): CKMB, TROPONINI, MYOGLOBIN in the last 168 hours.  Invalid input(s): CK ------------------------------------------------------------------------------------------------------------------ No results found for: BNP   Roxan Hockey M.D on 02/20/2018 at 6:22 PM  Between 7am to 7pm - Pager - [phone]  After 7pm go to [email or web address] - password [email or web address]  [facility] -  Office   [phone]   Voice Recognition Viviann Spare dictation system was used to create this note, attempts have been made to correct errors. Please contact the author with questions and/or clarifications.

## 2018-02-20 NOTE — Progress Notes (Signed)
Subjective Received 2U PRBC yesterday with appropriate response - acute blood loss anemia 2/2 surgery. Drain creatinine normal. Doing well today. Denies complaints. Having flatus and BMs. Denies n/v. Tolerating full liquids.  Objective: Vital signs in last 24 hours: Temp:  [97.4 F (36.3 C)-98.9 F (37.2 C)] 97.9 F (36.6 C) (03/18 0335) Pulse Rate:  [56-81] 57 (03/18 0500) Resp:  [11-24] 16 (03/18 0500) BP: (94-146)/(45-74) 146/70 (03/18 0500) SpO2:  [93 %-100 %] 97 % (03/18 0500) Weight:  [77.5 kg (170 lb 13.7 oz)] 77.5 kg (170 lb 13.7 oz) (03/18 0335) Last BM Date: 02/18/18  Intake/Output from previous day: 03/17 0701 - 03/18 0700 In: 1114.2 [P.O.:360; I.V.:10; Blood:744.2] Out: 4000 [Urine:3850; Drains:150] Intake/Output this shift: No intake/output data recorded.  Gen: NAD, comfortable CV: RRR Pulm: Normal work of breathing Abd: Soft, appropriately ttp around JP site; no tenderness elsewhere. Nondistended. Incisions c/d/i without erythema. JP ss output in bulb Ext: SCDs in place  Lab Results: CBC  Recent Labs    02/19/18 0305 02/20/18 0356  WBC 5.5 5.8  HGB 5.0* 7.8*  HCT 16.0* 24.1*  PLT 183 210   BMET Recent Labs    02/19/18 0305 02/20/18 0356  NA 137 141  K 4.0 3.6  CL 105 106  CO2 24 29  GLUCOSE 114* 100*  BUN 21* 15  CREATININE 1.50* 1.16  CALCIUM 7.7* 8.1*   PT/INR Recent Labs    02/18/18 0330  LABPROT 17.1*  INR 1.41   ABG Recent Labs    02/17/18 1018  PHART 7.355  HCO3 24.8    Studies/Results:  Anti-infectives: Anti-infectives (From admission, onward)   Start     Dose/Rate Route Frequency Ordered Stop   02/17/18 0542  cefoTEtan in Dextrose 5% (CEFOTAN) IVPB 2 g     2 g Intravenous On call to O.R. 02/17/18 0542 02/17/18 0754   02/17/18 0542  neomycin (MYCIFRADIN) tablet 1,000 mg  Status:  Discontinued     1,000 mg Oral 3 times per day 02/17/18 0542 02/17/18 1546   02/17/18 0542  metroNIDAZOLE (FLAGYL) tablet 1,000 mg  Status:   Discontinued     1,000 mg Oral 3 times per day 02/17/18 0542 02/17/18 1546       Assessment/Plan: Patient Active Problem List   Diagnosis Date Noted  . AKI (acute kidney injury) ([place]) 02/18/2018  . H/O colectomy 02/17/2018  . Colovesical fistula 02/17/2018  . Colonic diverticular abscess 11/27/2017  . Abscess of abdominal cavity ([place])   . Acute diverticulitis   . SOB (shortness of breath)   . Diverticulitis 11/26/2017  . Atrial fibrillation () [I48.91] 11/08/2017  . Encounter for therapeutic drug monitoring 11/08/2017  . 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 NSTEMI (non-ST elevated myocardial infarction) ([place]) 10/24/2017  . Diverticulitis of colon with perforation 10/07/2017  . Diarrhea 10/03/2017  . Iron deficiency anemia 10/03/2017  . Loss of weight 10/03/2017  . Left sided abdominal pain of unknown cause 10/03/2017  . Abnormal EKG 07/25/2017  . Chest pain 07/25/2017   s/p Procedure(s): LAPAROSCOPIC SIGMOID COLECTOMY  ERAS PATHWAY TAKEDOWN OF COLOVESSICAL COLOCUTANEOUS FISTULA FLEX SIGMOIDOSCOPY CYSTOSCOPY WITH RETROGRADE PYELOGRAM/URETERAL STENT PLACEMENT pelvic exploration, assist with bladder repair  BLADDER REPAIR 02/17/2018 POD#3  -Advance to GI soft diet -Ambulate 5x/day; IS 10x/hr while awake -Ok to transfer to tele -Decrease MIVF to 50cc/hr - will discontinue assuming tolerates regular food -If hgb stable tomorrow, will plan to restart warfarin -Wet to dry gauze to wound superior to pfannenstiel incision - site  of prior fistula take down in low midline abdomen -PPx: SQH, SCDs   LOS: 3 days   Sharon Mt. Dema Severin, M.D. [facility]

## 2018-02-21 LAB — BASIC METABOLIC PANEL
Anion gap: 7 (ref 5–15)
BUN: 11 mg/dL (ref 6–20)
CALCIUM: 8 mg/dL — AB (ref 8.9–10.3)
CO2: 27 mmol/L (ref 22–32)
CREATININE: 0.91 mg/dL (ref 0.61–1.24)
Chloride: 104 mmol/L (ref 101–111)
Glucose, Bld: 98 mg/dL (ref 65–99)
Potassium: 3.8 mmol/L (ref 3.5–5.1)
SODIUM: 138 mmol/L (ref 135–145)

## 2018-02-21 LAB — CBC
HCT: 22.7 % — ABNORMAL LOW (ref 39.0–52.0)
Hemoglobin: 7.4 g/dL — ABNORMAL LOW (ref 13.0–17.0)
MCH: 29.8 pg (ref 26.0–34.0)
MCHC: 32.6 g/dL (ref 30.0–36.0)
MCV: 91.5 fL (ref 78.0–100.0)
PLATELETS: 199 10*3/uL (ref 150–400)
RBC: 2.48 MIL/uL — AB (ref 4.22–5.81)
RDW: 16.4 % — ABNORMAL HIGH (ref 11.5–15.5)
WBC: 3.8 10*3/uL — AB (ref 4.0–10.5)

## 2018-02-21 LAB — MAGNESIUM: MAGNESIUM: 1.9 mg/dL (ref 1.7–2.4)

## 2018-02-21 LAB — PHOSPHORUS: Phosphorus: 3.2 mg/dL (ref 2.5–4.6)

## 2018-02-21 NOTE — Progress Notes (Signed)
Occupational Therapy Treatment Patient Details Name: Dean [name] MRN: [id number] DOB: Jun 27, 1946 Today's Date: 02/21/2018    History of present illness 8yoM with hx of CAD (s/p CABGx4 11/02/17), ischemic cardiomyopathy (LVEF 30%), gout, afib, asymptomatic nephrolithiasis,  perforated diverticulitis with abscess requiring percutaneous drain placement. On 02/17/18 underwent Laparoscopic low anterior resection with double stapled colorectal anastomosis (coloproctostomy), takedown of colovesical fistula, takedown of colocutaneous fistula, repair of cystotomy (colovesical fistula)   OT comments  OT goals met.  Will sign off and DC OT           Precautions / Restrictions Precautions Precautions: Other (comment) Precaution Comments: abd incisions, JP drain Restrictions Weight Bearing Restrictions: No       Mobility Bed Mobility Overal bed mobility: Independent                Transfers Overall transfer level: Independent                        ADL either performed or assessed with clinical judgement   ADL Overall ADL's : Independent                                       General ADL Comments: pt is back to baseline with simple ADL activity. Pt does not feel further OT needed. OT agrees     Vision Patient Visual Report: No change from baseline     Perception     Praxis      Cognition Arousal/Alertness: Awake/alert Behavior During Therapy: WFL for tasks assessed/performed Overall Cognitive Status: Within Functional Limits for tasks assessed                                 General Comments: pt reports he has had hallucinations        Exercises             Pertinent Vitals/ Pain       Pain Score: 3  Pain Location: abdomen  Pain Descriptors / Indicators: Sore Pain Intervention(s): Monitored during session;Premedicated before session      Progress Toward Goals  OT Goals(current goals can now be found in the care  plan section)  Progress towards OT goals: Goals met/education completed, patient discharged from [place] All goals met and education completed, patient discharged from OT services    Co-evaluation                 AM-PAC PT "6 Clicks" Daily Activity     Outcome Measure   Help from another person eating meals?: None Help from another person taking care of personal grooming?: None Help from another person toileting, which includes using toliet, bedpan, or urinal?: None Help from another person bathing (including washing, rinsing, drying)?: None Help from another person to put on and taking off regular upper body clothing?: None Help from another person to put on and taking off regular lower body clothing?: None 6 Click Score: 24       Activity Tolerance Patient tolerated treatment well   Patient Left in chair   Nurse Communication Mobility status        Time: 1324-4010 OT Time Calculation (min): 16 min  Charges: OT General Charges $OT Visit: 1 Visit OT Treatments $Self Care/Home Management : 8-22 mins  [place], [place] [phone]  Betsy Pries 02/21/2018, 11:03 AM

## 2018-02-21 NOTE — Progress Notes (Signed)
Patient Demographics:    Dean [name], is a 72 y.o. male, DOB - October 28, 1946, OXB:[id number]  Admit date - 02/17/2018   Admitting Physician Ileana Roup, MD  Outpatient Primary MD for the patient is [name], PA-C  LOS - 4  No chief complaint on file.       Subjective:    Dean [name] today has no fevers, no emesis,  No chest pain, no further hallucinations, no further dizziness, eating and drinking well, had BM,   Assessment  & Plan :    Active Problems:   Atrial fibrillation (HCC) [I48.91]   Diverticulitis   H/O colectomy   Colovesical fistula   AKI (acute kidney injury) ([place])    1)H/o Colovesical fistula - S/p repair 02/17/2018 by urology, general surgery and urology following, Foley catheter to stay in place for 10-14 days, patient would need outpatient  VT in clinic with cystogram prior to removal of Foley catheter, right lower quadrant JP drain fluid's creatinine is consistent with seroma rather than urine, no evidence of urinary leak  2)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 In short-term trials with alvimopan, no increased risk of MI was observed.... Continue Coreg as BP allows, prior to admission patient was on Coumadin, anticoagulation on hold for now, continue aspirin 81 mg daily  3)H/o Gout-apparently  Uloric carries increased cardiac risk  4)Pafib- stable, appears controlled, continue Coreg as BP allows, prior to admission patient was on Coumadin, anticoagulation on hold, may resume anticoagulation when okay with surgical team, patient has appointment with Dr. Acie Fredrickson on 03/09/2018, EF is 30-35%  5)Acute kidney injury-not present on admission, now resolved,  creatinine is down to 0.9 from  1.75,  AKi was due to transient hypotension and volume depletion in the postop patient, hydrate, avoid nephrotoxic agents, monitor renal function closely   6)Acute on chronic Anemia-hemoglobin is  Up to 7.8 from 5 after transfusion of 2 units of packed cells on 02/19/2018,  suspect due to postop blood loss, JP drain has drained only 90 ml in last 24 hours    7)Disposition-PT OT consult appreciated, may discharge home if H&H stable on 02/22/2018 and if okay with surgical team, surgical team to determine if okay to restart anticoagulation,  DVT Prophylaxis- SCDs  Code Status : Full  Disposition Plan  : home  Consults  :  Gen Surgery/Cardiology/Urology  Lab Results  Component Value Date   PLT 199 02/21/2018   Inpatient Medications  Scheduled Meds: . acetaminophen  650 mg Oral Q6H  . aspirin EC  81 mg Oral Daily  . carvedilol  3.125 mg Oral BID  . febuxostat  40 mg Oral Q lunch  . gabapentin  300 mg Oral TID  . hydrocortisone   Rectal TID  . phosphorus  500 mg Oral TID BM   Continuous Infusions: . sodium chloride Stopped (02/19/18 0956)   PRN Meds:.diphenhydrAMINE **OR** diphenhydrAMINE, haloperidol lactate, HYDROmorphone (DILAUDID) injection, metoprolol tartrate, ondansetron **OR** ondansetron (ZOFRAN) IV, oxyCODONE   Anti-infectives (From admission, onward)   Start     Dose/Rate Route Frequency Ordered Stop  02/17/18 0542  cefoTEtan in Dextrose 5% (CEFOTAN) IVPB 2 g     2 g Intravenous On call to O.R. 02/17/18 0542 02/17/18 0754   02/17/18 0542  neomycin (MYCIFRADIN) tablet 1,000 mg  Status:  Discontinued     1,000 mg Oral 3 times per day 02/17/18 0542 02/17/18 1546   02/17/18 0542  metroNIDAZOLE (FLAGYL) tablet 1,000 mg  Status:  Discontinued     1,000 mg Oral 3 times per day 02/17/18 0542 02/17/18 1546       Objective:   Vitals:   02/20/18 1500 02/20/18 2130 02/21/18 0432 02/21/18 1256  BP: (!) 143/65 (!) 141/71 130/64 119/64  Pulse:  (!) 59 65 (!) 54 63  Resp: 20 18 16 17   Temp: 98.4 F (36.9 C) 98.1 F (36.7 C) (!) 97.3 F (36.3 C) 97.6 F (36.4 C)  TempSrc: Oral Oral Oral Oral  SpO2: 100% 100% 100% 100%  Weight:   69.2 kg (152 lb 9.6 oz)   Height:        Wt Readings from Last 3 Encounters:  02/21/18 69.2 kg (152 lb 9.6 oz)  02/10/18 68.2 kg (150 lb 4 oz)  12/22/17 71.3 kg (157 lb 3.2 oz)     Intake/Output Summary (Last 24 hours) at 02/21/2018 2004 Last data filed at 02/21/2018 1915 Gross per 24 hour  Intake 360 ml  Output 1495 ml  Net -1135 ml    Physical Exam Gen:- Awake Alert,  In no apparent distress  HEENT:- [place].AT, No sclera icterus Neck-Supple Neck,No JVD,.  Lungs-  CTAB , no wheezing CV- S1, S2 normal, CABG scar Abd-  +ve B.Sounds, Abd Soft, appropriate postop tenderness, right lower quadrant JP drain with scant amount of sero- sanguinous material Extremity/Skin:- No  edema,   pulses are intact Psych-affect is appropriate, oriented x3 (no further hallucination) Neuro-no new focal deficits, no tremors    Data Review:   Micro Results Recent Results (from the past 240 hour(s))  MRSA PCR Screening     Status: None   Collection Time: 02/17/18  5:03 PM  Result Value Ref Range Status   MRSA by PCR NEGATIVE NEGATIVE Final    Comment:        The GeneXpert MRSA Assay (FDA approved for NASAL specimens only), is one component of a comprehensive MRSA colonization surveillance program. It is not intended to diagnose MRSA infection nor to guide or monitor treatment for MRSA infections. Performed at [facility], [place] [address] [place], [place] [address]    Radiology Reports Ir Catheter Tube Change  Result Date: 01/27/2018 INDICATION: 72 year old male with complicated diverticulitis and clinical signs of a colovesicular fistula. He is awaiting surgical repair but having trouble with his percutaneous drainage catheter. The catheter has been in place since 12/02/2017. The  patient has skin breakdown around the retention suture from chronic traction. Additionally, the tube may be partially clogged. EXAM: IR CATHETER TUBE CHANGE MEDICATIONS: None ANESTHESIA/SEDATION: None COMPLICATIONS: None immediate. PROCEDURE: Informed written consent was obtained from the patient after a thorough discussion of the procedural risks, benefits and alternatives. All questions were addressed. Maximal Sterile Barrier Technique was utilized including caps, mask, sterile gowns, sterile gloves, sterile drape, hand hygiene and skin antiseptic. A timeout was performed prior to the initiation of the procedure. A gentle hand injection of contrast material was performed. There is immediate passage of contrast material from the drainage catheter into the urinary bladder. No communication with the sigmoid colon. The distal aspect of the drainage  catheter is partially occluded. The retention suture was cut and removed. The catheter was transected and removed over a short Amplatz wire. A new 10 [place] Flexima all-purpose drainage catheter was advanced over the wire and formed. The catheter was secured to the skin with an adhesive fixation device. No retention suture was placed. IMPRESSION: 1. Fistulous communication between the bladder and the percutaneous drainage catheter. 2. Successful exchange for a new 10 French percutaneous drain. 3. Instructed patient to apply Neosporin or triple antibiotic ointment to the area of skin breakdown for the next few days to facilitate healing. 4. Patient has surgery scheduled in approximately 3 weeks. If he cannot tolerate the presence of the drainage catheter, it could conceivably be removed the week prior to surgery. 5. Signed, Criselda Peaches, MD [facility] Radiology Electronically Signed   By: Jacqulynn Cadet M.D.   On: 01/27/2018 17:24   Dg C-arm 1-60 Min-no Report  Result Date: 02/17/2018 Fluoroscopy was utilized by  the requesting physician.  No radiographic interpretation.    CBC Recent Labs  Lab 02/17/18 1624 02/18/18 0330 02/19/18 0305 02/20/18 0356 02/20/18 1428 02/21/18 0533  WBC 6.1 8.6 5.5 5.8  --  3.8*  HGB 9.5* 8.5* 5.0* 7.8* 8.3* 7.4*  HCT 28.9* 26.4* 16.0* 24.1* 25.4* 22.7*  PLT 161 299 183 210  --  199  MCV 89.8 90.1 91.4 89.9  --  91.5  MCH 29.5 29.0 28.6 29.1  --  29.8  MCHC 32.9 32.2 31.3 32.4  --  32.6  RDW 14.8 15.2 15.8* 16.4*  --  16.4*   Chemistries  Recent Labs  Lab 02/17/18 1018 02/17/18 1624 02/18/18 0330 02/19/18 0305 02/20/18 0356 02/21/18 0533  NA 138  --  137 137 141 138  K 3.3*  --  4.5 4.0 3.6 3.8  CL  --   --  105 105 106 104  CO2  --   --  21* 24 29 27   GLUCOSE  --   --  158* 114* 100* 98  BUN  --   --  20 21* 15 11  CREATININE  --  1.26* 1.74* 1.50* 1.16 0.91  CALCIUM  --   --  8.1* 7.7* 8.1* 8.0*  MG  --   --  1.4* 2.1 1.7 1.9   ------------------------------------------------------------------------------------------------------------------ No results for input(s): CHOL, HDL, LDLCALC, TRIG, CHOLHDL, LDLDIRECT in the last 72 hours.  Lab Results  Component Value Date   HGBA1C 5.8 (H) 10/25/2017   ------------------------------------------------------------------------------------------------------------------ No results for input(s): TSH, T4TOTAL, T3FREE, THYROIDAB in the last 72 hours.  Invalid input(s): FREET3 ------------------------------------------------------------------------------------------------------------------ No results for input(s): VITAMINB12, FOLATE, FERRITIN, TIBC, IRON, RETICCTPCT in the last 72 hours.  Coagulation profile Recent Labs  Lab 02/17/18 0610 02/17/18 0838 02/18/18 0330  INR 1.79 1.85 1.41    No results for input(s): DDIMER in the last 72 hours.  Cardiac Enzymes No results for input(s): CKMB, TROPONINI, MYOGLOBIN in the last 168 hours.  Invalid input(s):  CK ------------------------------------------------------------------------------------------------------------------ No results found for: BNP   Roxan Hockey M.D on 02/21/2018 at 8:04 PM  Between 7am to 7pm - Pager - [phone]  After 7pm go to [email or web address] - password [email or web address]  [facility] -  Office  [phone]   Voice Recognition Viviann Spare dictation system was used to create this note, attempts have been made to correct errors. Please contact the author with questions and/or clarifications.

## 2018-02-21 NOTE — Progress Notes (Signed)
Subjective Doing well today. Denies complaints. Having flatus and BMs. Denies n/v. Adv to soft diet yesterday which he has tolerated.  Objective: Vital signs in last 24 hours: Temp:  [97.3 F (36.3 C)-98.4 F (36.9 C)] 97.3 F (36.3 C) (03/19 0432) Pulse Rate:  [54-65] 54 (03/19 0432) Resp:  [16-23] 16 (03/19 0432) BP: (123-143)/(57-79) 130/64 (03/19 0432) SpO2:  [100 %] 100 % (03/19 0432) Weight:  [69.2 kg (152 lb 9.6 oz)] 69.2 kg (152 lb 9.6 oz) (03/19 0432) Last BM Date: 02/20/18  Intake/Output from previous day: 03/18 0701 - 03/19 0700 In: 1130 [P.O.:1080; IV Piggyback:50] Out: 1010 [Urine:955; Drains:55] Intake/Output this shift: No intake/output data recorded.  Gen: NAD, comfortable CV: RRR Pulm: Normal work of breathing Abd: Soft, appropriately ttp around JP site; no tenderness elsewhere. Nondistended. Incisions c/d/i without erythema. JP ss output in bulb Ext: SCDs in place  Lab Results: CBC  Recent Labs    02/20/18 0356 02/20/18 1428 02/21/18 0533  WBC 5.8  --  3.8*  HGB 7.8* 8.3* 7.4*  HCT 24.1* 25.4* 22.7*  PLT 210  --  199   BMET Recent Labs    02/20/18 0356 02/21/18 0533  NA 141 138  K 3.6 3.8  CL 106 104  CO2 29 27  GLUCOSE 100* 98  BUN 15 11  CREATININE 1.16 0.91  CALCIUM 8.1* 8.0*   PT/INR No results for input(s): LABPROT, INR in the last 72 hours. ABG No results for input(s): PHART, HCO3 in the last 72 hours.  Invalid input(s): PCO2, PO2  Studies/Results:  Anti-infectives: Anti-infectives (From admission, onward)   Start     Dose/Rate Route Frequency Ordered Stop   02/17/18 0542  cefoTEtan in Dextrose 5% (CEFOTAN) IVPB 2 g     2 g Intravenous On call to O.R. 02/17/18 0542 02/17/18 0754   02/17/18 0542  neomycin (MYCIFRADIN) tablet 1,000 mg  Status:  Discontinued     1,000 mg Oral 3 times per day 02/17/18 0542 02/17/18 1546   02/17/18 0542  metroNIDAZOLE (FLAGYL) tablet 1,000 mg  Status:  Discontinued     1,000 mg Oral 3 times  per day 02/17/18 0542 02/17/18 1546       Assessment/Plan: Patient Active Problem List   Diagnosis Date Noted  . AKI (acute kidney injury) ([place]) 02/18/2018  . H/O colectomy 02/17/2018  . Colovesical fistula 02/17/2018  . Colonic diverticular abscess 11/27/2017  . Abscess of abdominal cavity ([place])   . Acute diverticulitis   . SOB (shortness of breath)   . Diverticulitis 11/26/2017  . Atrial fibrillation ([place]) [I48.91] 11/08/2017  . Encounter for therapeutic drug monitoring 11/08/2017  . 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 NSTEMI (non-ST elevated myocardial infarction) ([place]) 10/24/2017  . Diverticulitis of colon with perforation 10/07/2017  . Diarrhea 10/03/2017  . Iron deficiency anemia 10/03/2017  . Loss of weight 10/03/2017  . Left sided abdominal pain of unknown cause 10/03/2017  . Abnormal EKG 07/25/2017  . Chest pain 07/25/2017   s/p Procedure(s): LAPAROSCOPIC SIGMOID COLECTOMY  ERAS PATHWAY TAKEDOWN OF COLOVESSICAL COLOCUTANEOUS FISTULA FLEX SIGMOIDOSCOPY CYSTOSCOPY WITH RETROGRADE PYELOGRAM/URETERAL STENT PLACEMENT pelvic exploration, assist with bladder repair  BLADDER REPAIR 02/17/2018 POD#4  -Diet as tolerated -Ambulate 5x/day; IS 10x/hr while awake -D/C IVF -Hgb 7.4<--8.3<--7.8. Recheck cbc in AM. If stable, will plan to restart warfarin and potentially discharge tomorrow -Wet to dry gauze to wound superior to pfannenstiel incision - site of prior fistula take down in low midline abdomen -PPx: SQH, SCDs  LOS: 4 days   Sharon Mt. Dema Severin, M.D. General and Colorectal Surgery [facility]

## 2018-02-21 NOTE — Progress Notes (Signed)
Report received from H. Holderness, RN.  Assessment unchanged. Avabella Wailes B  

## 2018-02-21 NOTE — Care Management Important Message (Signed)
Important Message  Patient Details  Name: Mars Scheaffer MRN: [id number] Date of Birth: 08-02-1946   Medicare Important Message Given:  Yes    Kerin Salen 02/21/2018, 11:56 AMImportant Message  Patient Details  Name: Brance Dartt MRN: [id number] Date of Birth: May 09, 1946   Medicare Important Message Given:  Yes    Kerin Salen 02/21/2018, 11:56 AM

## 2018-02-21 NOTE — Progress Notes (Signed)
Switched patient from foley standard bag to leg bag.  Gave patient supplies for home and did foley teaching as well as care for the foley catheter at home.  Patient assisted with switching from leg bag to standard bag without issue.  Questions were answered.  Patient will need reinforcement day of discharge but at this time felt comfortable with the catheter and currently does not have any questions.

## 2018-02-22 LAB — PHOSPHORUS: Phosphorus: 4 mg/dL (ref 2.5–4.6)

## 2018-02-22 LAB — CBC
HCT: 24.7 % — ABNORMAL LOW (ref 39.0–52.0)
Hemoglobin: 8.1 g/dL — ABNORMAL LOW (ref 13.0–17.0)
MCH: 30.3 pg (ref 26.0–34.0)
MCHC: 32.8 g/dL (ref 30.0–36.0)
MCV: 92.5 fL (ref 78.0–100.0)
PLATELETS: 242 10*3/uL (ref 150–400)
RBC: 2.67 MIL/uL — ABNORMAL LOW (ref 4.22–5.81)
RDW: 16.1 % — AB (ref 11.5–15.5)
WBC: 4.3 10*3/uL (ref 4.0–10.5)

## 2018-02-22 LAB — BASIC METABOLIC PANEL
ANION GAP: 9 (ref 5–15)
BUN: 11 mg/dL (ref 6–20)
CO2: 28 mmol/L (ref 22–32)
Calcium: 8.3 mg/dL — ABNORMAL LOW (ref 8.9–10.3)
Chloride: 103 mmol/L (ref 101–111)
Creatinine, Ser: 1.04 mg/dL (ref 0.61–1.24)
GFR calc Af Amer: >60 mL/min (ref >60)
GFR calc non Af Amer: >60 mL/min (ref >60)
GLUCOSE: 98 mg/dL (ref 65–99)
POTASSIUM: 3.9 mmol/L (ref 3.5–5.1)
Sodium: 140 mmol/L (ref 135–145)

## 2018-02-22 LAB — MAGNESIUM: Magnesium: 1.8 mg/dL (ref 1.7–2.4)

## 2018-02-22 MED ORDER — TRAMADOL HCL 50 MG PO TABS: 50.0000 mg | ORAL_TABLET | Freq: Four times a day (QID) | ORAL | 0 refills | Status: DC | PRN

## 2018-02-22 NOTE — Discharge Summary (Addendum)
Patient ID: Dean [name] MRN: [id number] DOB/AGE: Jul 27, 1946 72 y.o.  Admit date: 02/17/2018 Discharge date: 02/22/2018  Discharge Diagnoses Patient Active Problem List   Diagnosis Date Noted  . AKI (acute kidney injury) ([place]) 02/18/2018  . H/O colectomy 02/17/2018  . Colovesical fistula 02/17/2018  . Colonic diverticular abscess 11/27/2017  . Abscess of abdominal cavity ([place])   . Acute diverticulitis   . SOB (shortness of breath)   . Diverticulitis 11/26/2017  . Atrial fibrillation ([place]) [I48.91] 11/08/2017  . Encounter for therapeutic drug monitoring 11/08/2017  . 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 NSTEMI (non-ST elevated myocardial infarction) ([place]) 10/24/2017  . Diverticulitis of colon with perforation 10/07/2017  . Diarrhea 10/03/2017  . Iron deficiency anemia 10/03/2017  . Loss of weight 10/03/2017  . Left sided abdominal pain of unknown cause 10/03/2017  . Abnormal EKG 07/25/2017  . Chest pain 07/25/2017    Consultants None  Procedures Laparoscopic sigmoid colectomy/LAR with double stapled colorectal anastomosis, repair of cystotomy (colo-vesical fistula), takedown of colocutaneous fistula with cysto/stents (urology) 02/17/18   Hospital Course: He was admitted postoperatively to stepdown. On POD#2 he was found to be anemic and received 2U PRBC with appropriate response. He began having bowel function. His diet was advanced to a soft diet on POD#3. He tolerated a diet without n/v and continued to have bowel function. His Hgb remained stable. On 02/22/18 he was deemed stable for discharge home.   Allergies as of 02/22/2018      Reactions   Crestor [rosuvastatin Calcium]    Reported intolerance to Lipitor in the past Crestor 40mg  patient reported muscle aches, dose reduced to rechallenge      Medication List    TAKE these medications   acetaminophen 650 MG CR tablet Commonly known as:  TYLENOL Take 650 mg by mouth 3 (three) times daily.   aspirin 81 MG EC tablet Take 1  tablet (81 mg total) by mouth daily.   carvedilol 3.125 MG tablet Commonly known as:  COREG Take 1 tablet (3.125 mg total) by mouth 2 (two) times daily.   colchicine 0.6 MG tablet Take 0.6 mg by mouth daily as needed (gout).   febuxostat 40 MG tablet Commonly known as:  ULORIC Take 40 mg by mouth daily with lunch.   furosemide 20 MG tablet Commonly known as:  LASIX TAKE 1 TABLET(20 MG) BY MOUTH DAILY AS NEEDED FOR FLUID RETENTION OR SWELLING   hydrocortisone cream 1 % Apply 1 application topically as needed for itching. Do NOT apply to sternal wound   Vitamin D3 5000 units Caps Take 5,000 Units daily by mouth.   warfarin 1 MG tablet Commonly known as:  COUMADIN Take 1 tablet (1 mg total) by mouth daily at 6 PM. What changed:    how much to take  additional instructions        Follow-up Information    Ardis Hughs, MD Follow up in 2 week(s).   Specialty:  Urology Why:  for catheter removal Contact information: [place] [address] [phone]        Ileana Roup, MD. Schedule an appointment as soon as possible for a visit in 2 week(s).   Specialty:  General Surgery Contact information: [place] [address] [phone]          Virat Prather M. Dema Severin, M.D. [facility]

## 2018-02-22 NOTE — Progress Notes (Signed)
Chart reviewed, patient is discharged by general surgery.

## 2018-02-22 NOTE — Care Management Note (Signed)
Case Management Note  Patient Details  Name: Nicolaas Savo MRN: [id number] Date of Birth: July 29, 1946  Subjective/Objective: No PT/OT f/u. No CM needs.                   Action/Plan:d/c home.   Expected Discharge Date:  02/22/18               Expected Discharge Plan:  Home/Self Care  In-House Referral:     Discharge planning Services  CM Consult  Post Acute Care Choice:    Choice offered to:     DME Arranged:    DME Agency:     HH Arranged:    HH Agency:     Status of Service:  Completed, signed off  If discussed at [employer] of Stay Meetings, dates discussed:    Additional Comments:  Dessa Phi, RN 02/22/2018, 11:30 AM

## 2018-02-22 NOTE — Discharge Instructions (Signed)
POST OP INSTRUCTIONS  1. DIET: As tolerated. Be sure to include lots of fluids daily. Make sure you chew your food well.  2. Take your usually prescribed home medications unless otherwise directed.  3. PAIN CONTROL: a. Pain is best controlled by a usual combination of three different methods TOGETHER: i. Ice/Heat ii. Over the counter pain medication iii. Prescription pain medication b. Most patients will experience some swelling and bruising around the surgical site.  Ice packs or heating pads (30-60 minutes up to 6 times a day) will help. Some people prefer to use ice alone, heat alone, alternating between ice & heat.  Experiment to what works for you.  Swelling and bruising can take several weeks to resolve.   c. It is helpful to take an over-the-counter pain medication regularly for the first few weeks: i. Ibuprofen (Motrin/Advil) - 200mg  tabs - take 3 tabs (600mg ) every 6 hours as needed for pain ii. Acetaminophen (Tylenol) - you may take 650mg  every 6 hours as needed. You can take this with motrin as they act differently on the body. If you are taking a narcotic pain medication that has acetaminophen in it, do not take over the counter tylenol at the same time.  Iii. NOTE: You may take both of these medications together - most patients  find it most helpful when alternating between the two (i.e. Ibuprofen at 6am,  tylenol at 9am, ibuprofen at 12pm ...) d. A  prescription for pain medication should be given to you upon discharge.  Take your pain medication as prescribed if your pain is not adequatly controlled with the over-the-counter pain reliefs mentioned above.  4. Avoid getting constipated.  Between the surgery and the pain medications, it is common to experience some constipation.  Increasing fluid intake and taking a fiber supplement (such as Metamucil, Citrucel, FiberCon, MiraLax, etc) 1-2 times a day regularly will usually help prevent this problem from occurring.  A mild laxative  (prune juice, Milk of Magnesia, MiraLax, etc) should be taken according to package directions if there are no bowel movements after 48 hours.    5. Dressing: Your incisions are covered with Dermabond which is like sterile superglue for the skin. This will come off on it's own in a couple weeks. It is waterproof and you may bathe normally starting the day after your surgery in a shower. Avoid baths/pools/lakes/oceans until your wounds have fully healed. Keep your drain site dry for the first 48hrs after removal when bathing (after 48hrs, ok to get soap/water over the drain site).  6. ACTIVITIES as tolerated:   a. Avoid heavy lifting (>10lbs or 1 gallon of milk) for the next 6 weeks. b. You may resume regular (light) daily activities beginning the next day--such as daily self-care, walking, climbing stairs--gradually increasing activities as tolerated.  If you can walk 30 minutes without difficulty, it is safe to try more intense activity such as jogging, treadmill, bicycling, low-impact aerobics.  c. DO NOT PUSH THROUGH PAIN.  Let pain be your guide: If it hurts to do something, don't do it. d. Dean [name] may drive when you are no longer taking prescription pain medication, you can comfortably wear a seatbelt, and you can safely maneuver your car and apply brakes.  7. FOLLOW UP in our office a. Please call [facility] at [phone] to set up an appointment to see your surgeon in the office for a follow-up appointment approximately 2 weeks after your surgery. b. Make sure that you call for this appointment  the day you arrive home to insure a convenient appointment time.  9. If you have disability or family leave forms that need to be completed, you may have them completed by your primary care physician's office; for return to work instructions, please ask our office staff and they will be happy to assist you in obtaining this documentation   When to call us [phone]: 1. Poor pain control 2. Reactions /  problems with new medications (rash/itching, etc)  3. Fever over 101.5 F (38.5 C) 4. Inability to urinate 5. Nausea/vomiting 6. Worsening swelling or bruising 7. Continued bleeding from incision. 8. Increased pain, redness, or drainage from the incision  The clinic staff is available to answer your questions during regular business hours (8:30am-5pm).  Please dont hesitate to call and ask to speak to one of our nurses for clinical concerns.   A surgeon from [facility] is always on call at the hospitals   If you have a medical emergency, go to the nearest emergency room or call 911.  [facility], [place] [address] [place], [place], [place]  [address] MAIN: [id number] FAX: [phone] [email or web address]     Foley Catheter Care, Adult A soft, flexible tube (Foley catheter) has been placed in your bladder. This may be done to temporarily help with urine drainage after an operation or to relieve blockage from an enlarged prostate gland. HOME CARE INSTRUCTIONS  If you are going home with a Foley catheter in place, follow these instructions: Taking Care of the Catheter:  Keep the area where the catheter leaves your body clean.  Attach the catheter to the leg so there is no tension on the catheter.  Keep the drainage bag below the level of the bladder, but keep it OFF the floor.  Do not take long soaking baths.   Wash your hands before touching ANYTHING related to the catheter or bag.  Using mild soap and warm water on a washcloth:  Clean the area closest to the catheter insertion site using a circular motion around the catheter.  Clean the catheter itself by wiping AWAY from the insertion site for several inches down the tube.  NEVER wipe upward as this could sweep bacteria up into the urethra (tube in your body that normally drains the bladder) and cause infection. Taking Care of the Drainage Bags:  Two drainage bags will  be taken home: a large overnight drainage bag, and a smaller leg bag which fits underneath clothing.  It is okay to wear the overnight bag at any time, but NEVER wear the smaller leg bag at night.  Keep the drainage bag well below the level of your bladder. This prevents backflow of urine into the bladder and allows the urine to drain freely.  Anchor the tubing to your leg to prevent pulling or tension on the catheter. Use tape or a leg strap provided by the hospital.  Empty the drainage bag when it is  to  full. Wash your hands before and after touching the bag.  Periodically check the tubing for kinks to make sure there is no pressure on the tubing which could restrict the flow of urine. Changing the Drainage Bags:  Cleanse both ends of the clean bag with alcohol before changing.  Pinch off the rubber catheter to avoid urine spillage during the disconnection.  Disconnect the dirty bag and connect the clean one.  Empty the dirty bag carefully to avoid a urine spill.  Attach the  new bag to the leg with tape or a leg strap. Cleaning the Drainage Bags:  Whenever a drainage bag is disconnected, it must be cleaned quickly so it is ready for the next use.  Wash the bag in warm, soapy water.  Rinse the bag thoroughly with warm water.  Soak the bag for 30 minutes in a solution of Dru Primeau vinegar and water (1 cup vinegar to 1 quart warm water).  Rinse with warm water. SEEK MEDICAL CARE IF:   Some pain develops in the kidney (lower back) area.  The urine is cloudy or smells bad.  There is some blood in the urine.  The catheter becomes clogged and/or there is no urine drainage. SEEK IMMEDIATE MEDICAL CARE IF:   You have moderate or severe pain in the kidney region.  You start to throw up (vomit).  Blood fills the tube.  Worsening belly (abdominal) pain develops.  You have a fever. MAKE SURE YOU:   Understand these instructions.  Will watch your condition.  Will get  help right away if you are not doing well or get worse.  Call [facility] if you have any questions or concerns: [phone]

## 2018-02-23 ENCOUNTER — Telehealth: Payer: Self-pay | Admitting: Cardiovascular Disease

## 2018-02-23 NOTE — Telephone Encounter (Signed)
Pt no longer managed by our coumadin clinic - I believe based on our last note he is being managed by [facility]. It appears per discharge summary he was to restart at discharge. Would recommend he restart (as long as he is not having bleeding complications) and contact managing provider to have INR checked in 5-7 days.

## 2018-02-23 NOTE — Telephone Encounter (Signed)
Last [facility] visit on 11/18/17; routing to [facility] for guidance

## 2018-02-23 NOTE — Telephone Encounter (Signed)
Mr.Delbridge is calling to find out if he should re start his coumadin   now . Please call

## 2018-02-23 NOTE — Telephone Encounter (Signed)
Left detailed message on patient's cell that we have not managed his coumadin since December. I advised him to call [facility] or the provider's office that is managing his coumadin. I advised him to call back with additional questions or concerns.

## 2018-03-02 ENCOUNTER — Telehealth: Payer: Self-pay

## 2018-03-02 NOTE — Telephone Encounter (Signed)
I have appt to see hm on April 3. If he has not had any further episodes of AF ( just had post op AF following CABG)  Then we can DC the coumadin

## 2018-03-02 NOTE — Telephone Encounter (Signed)
Patient called this morning concerned about taking his Coumadin.  He does not know whether to start or stop the medication.  I did say that he was started on this medication while hospitalized; however, it is ultimately up to his Cardiologist what he should do about taking this medication.  He stated that he was getting his INR's checked with his PCP strictly because it was convenient to his home and having the results faxed to his Cardiologist.  He stated that he was going to speak with his Cardiologist on Tuesday when he had an appointment and stated that he would continue to take it until then.  I did see in a note at his last appointment with Cardiology that his coumadin was managed by his PCP.  However, patient is confused and needs guidance.

## 2018-03-08 ENCOUNTER — Encounter: Payer: Self-pay | Admitting: Cardiovascular Disease

## 2018-03-08 ENCOUNTER — Ambulatory Visit: Payer: Medicare Other | Admitting: Cardiovascular Disease

## 2018-03-08 VITALS — BP 98/62 | HR 77 | Ht 69.0 in | Wt 147.8 lb

## 2018-03-08 DIAGNOSIS — I480 Paroxysmal atrial fibrillation: Secondary | ICD-10-CM

## 2018-03-08 DIAGNOSIS — I25118 Atherosclerotic heart disease of native coronary artery with other forms of angina pectoris: Secondary | ICD-10-CM

## 2018-03-08 NOTE — Patient Instructions (Addendum)
Medication Instructions:  Your physician has recommended you make the following change in your medication:   STOP Coumadin (Warfarin)   Labwork: None Ordered   Testing/Procedures: None Ordered   Follow-Up: Your physician recommends that you schedule a follow-up appointment in: 3 months with Dr. Acie Fredrickson   If you need a refill on your cardiac medications before your next appointment, please call your pharmacy.   Thank you for choosing [facility]! Christen Bame, RN [phone]

## 2018-03-08 NOTE — Progress Notes (Signed)
Cardiology Office Note   Date:  03/08/2018   ID:  Dean [name], DOB 1946/08/16, MRN [id number]  PCP:  [name], PA-C  Cardiologist:   Mertie Moores, MD   Chief Complaint  Patient presents with  . Coronary Artery Disease  . Atrial Fibrillation   Problem list 1. CAD  2.   Post of atrial fib  3 Hyperlipidemia 4. Gout 5. GERD      [name] is a 72 y.o. male who presents for further evaluation of some chest pain and shortness of breath. I have reviewed records from [facility] center.  Has intermittant symptoms of DOE and CP Has occasional palpitations  Is hoarse at times  Has had a stress test - normal  Echo was normal .   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   Oct. 9, 2017:  Has started exercising some.   Is having gout issues which limits his exercise at time  Able to walk around without any significant CP or dyspnea.    Aug. 20, 2018  Mr. Crisp is seen back for follow-up visit for his premature ventricular contractions. Is taking Uloric for gouty tophi, Makes him nauseated, fatigued.   Has not had any exertional CP recently  Is on Indocin Saw his GP , saw something odd on ECG and he was sent here.  ECG here shows NSR , NS ST abn.  PVCs  Does not exercise,   No hx of CP or DOE Has rare episodes of CP when he is lying down .   March 08, 2018: Doing well from a cardiac standpont  Has had CABG , post op AF .   Nicely from a cardiac standpoint but then developed diverticulitis requiring surgery in mid March.  He was told by somebody during that hospitalization that he may have had a brief episode of atrial fibrillation.  There are no recorded episodes of atrial for ablation on the telemetry strips and no EKGs suggesting atrial fibrillation.   He related  a couple other complaints.  He has a bad taste in his mouth and has no appetite.  He also has had profound itching.  He thinks that it might be due to the warfarin therapy.  Past Medical History:  Diagnosis Date  . Acid reflux    history of  . Anemia   . Atrial fibrillation ([place]) 10/2017   RVR  . Coronary artery disease   . Diverticulitis 11/26/2017  . Gout   . Hepatitis A age 14  . History of kidney stones   . Hyperlipemia   . Hypogonadism male   . Irregular heart beat   . Ischemic cardiomyopathy   . Non compliance with medical treatment   . Non-STEMI (non-ST elevated myocardial infarction) ([place]) 10/2017  . Pelvic abscess in male [facility])    percutaneous drain placement    Past Surgical History:  Procedure Laterality Date  . BLADDER REPAIR  02/17/2018   Procedure: BLADDER REPAIR;  Surgeon: Ileana Roup, MD;  Location: [facility];  Service: General;;  . BLADDER REPAIR  02/17/2018   Procedure: pelvic exploration, assist with bladder repair ;  Surgeon: Raynelle Bring, MD;  Location: [facility];  Service: Urology;;  . COLONOSCOPY  ~ 2014  Dr Ferdinand Lango in [facility].  Diverticulosis.  pt denies colon polyps.    . CORONARY ARTERY BYPASS GRAFT N/A 10/26/2017   Procedure: CORONARY ARTERY BYPASS GRAFTING (CABG) x four , using left internal mammary artery and bilateral legs greater saphenous vein harvested endoscopically;  Surgeon: Gaye Pollack, MD;  Location: [facility];  Service: Open Heart Surgery;  Laterality: N/A;  . CYSTOSCOPY W/ URETERAL STENT PLACEMENT Bilateral 02/17/2018   Procedure: CYSTOSCOPY WITH RETROGRADE PYELOGRAM/URETERAL STENT PLACEMENT;  Surgeon: Ardis Hughs, MD;  Location: [facility];  Service: Urology;  Laterality: Bilateral;  . IR CATHETER TUBE CHANGE  01/27/2018  . IR RADIOLOGIST EVAL & MGMT  12/21/2017  . LAPAROSCOPIC SIGMOID COLECTOMY N/A 02/17/2018   Procedure: LAPAROSCOPIC SIGMOID COLECTOMY  ERAS PATHWAY TAKEDOWN OF COLOVESSICAL COLOCUTANEOUS FISTULA;  Surgeon: Ileana Roup, MD;  Location: [facility];  Service: General;  Laterality: N/A;  . LEFT HEART CATH AND CORONARY ANGIOGRAPHY N/A 10/25/2017   Procedure: LEFT HEART CATH AND CORONARY ANGIOGRAPHY;  Surgeon: Leonie Man, MD;  Location: [facility];  Service: Cardiovascular;  Laterality: N/A;  . SIGMOIDOSCOPY N/A 02/17/2018   Procedure: FLEX SIGMOIDOSCOPY;  Surgeon: Ileana Roup, MD;  Location: [facility];  Service: General;  Laterality: N/A;  . TEE WITHOUT CARDIOVERSION N/A 10/26/2017   Procedure: TRANSESOPHAGEAL ECHOCARDIOGRAM (TEE);  Surgeon: Gaye Pollack, MD;  Location: [facility];  Service: Open Heart Surgery;  Laterality: N/A;     Current Outpatient Medications  Medication Sig Dispense Refill  . acetaminophen (TYLENOL) 650 MG CR tablet Take 650 mg by mouth every 8 (eight) hours as needed for pain.     Marland Kitchen aspirin EC 81 MG EC tablet Take 1 tablet (81 mg total) by mouth daily.    . carvedilol (COREG) 3.125 MG tablet Take 1 tablet (3.125 mg total) by mouth 2 (two) times daily. 60 tablet 3  . Cholecalciferol (VITAMIN D3) 5000 units CAPS Take 5,000 Units daily by mouth.     . colchicine 0.6 MG tablet Take 0.6 mg by mouth daily as needed (gout).     . febuxostat (ULORIC) 40 MG tablet Take 40 mg by mouth daily with lunch.     . hydrocortisone cream 1 % Apply 1 application topically as needed for itching. Do NOT apply to sternal wound 30 g 0   No current facility-administered medications for this visit.     Allergies:   Crestor [rosuvastatin calcium]    Social History:  The patient  reports that he has quit smoking. He has never used smokeless tobacco. He reports that he drinks alcohol. He reports that he does not use drugs.   Family History:  The patient's family history includes Alzheimer's disease in his maternal grandmother and mother; Hyperlipidemia in his brother and mother.    ROS:     Physical Exam: Blood pressure 98/62, pulse 77, height 5\' 9"  (1.753 m), weight 147 lb 12.8 oz  (67 kg), SpO2 94 %.  GEN: Elderly, thin man.  He is lost quite a bit of weight since I last saw him several months ago.  HEENT: Normal NECK: No JVD; No carotid bruits LYMPHATICS: No lymphadenopathy CARDIAC: RR, sternotomy scar is well-healed. RESPIRATORY:  Clear to auscultation without rales, wheezing or rhonchi  ABDOMEN: Soft, non-tender, non-distended MUSCULOSKELETAL:  No edema; No deformity  SKIN: Warm and dry NEUROLOGIC:  Alert and oriented x 3   EKG:      Recent Labs: 10/25/2017: TSH 5.811 11/26/2017: ALT 11 02/22/2018: BUN  11; Creatinine, Ser 1.04; Hemoglobin 8.1; Magnesium 1.8; Platelets 242; Potassium 3.9; Sodium 140    Lipid Panel    Component Value Date/Time   CHOL 128 10/25/2017 0341   TRIG 104 10/25/2017 0341   HDL 39 (L) 10/25/2017 0341   CHOLHDL 3.3 10/25/2017 0341   VLDL 21 10/25/2017 0341   LDLCALC 68 10/25/2017 0341      Wt Readings from Last 3 Encounters:  03/08/18 147 lb 12.8 oz (67 kg)  02/22/18 150 lb 12.7 oz (68.4 kg)  02/10/18 150 lb 4 oz (68.2 kg)      Other studies Reviewed: Additional studies/ records that were reviewed today include: . Review of the above records demonstrates:   ASSESSMENT AND PLAN:  1.  CAD - s/p CABG  -  Doing well from a cardiac standpoint He had a brief epidose of AF mediately following surgery.  This resolved with amiodarone.  We have not had any documented episodes since that time.  He is been on Coumadin thinks that he has had an allergic reaction to Coumadin.  2. Atrial fib;   Had PAF following CABG  In Dec.  2018  He has not had any further documented episodes of atrial fibrillation since that time. Will DC warfarin Cont ASA 325 mg a day  Continue metoprolol  He will pay attention to his symptoms  I taught him how to take his pulse and instructed him to call me if he develops a irregular heart rate.  3.  Hyperlipidemia -we will check lipids next office visit in 3 months.   4.   Diverticulitis  - s/p  surgery 1 month ago  Had a brief run of AF reportedly ( not scanned into the chart)   I'll see him on an as-needed basis.  Current medicines are reviewed at length with the patient today.  The patient does not have concerns regarding medicines.  The following changes have been made:  no change  Labs/ tests ordered today include:  No orders of the defined types were placed in this encounter.    Disposition:   Follow-up with me in 3 months.    Mertie Moores, MD  03/08/2018 2:09 PM    [facility] [place], [place], [place]  [address] Phone: [phone]; Fax: [phone]

## 2018-03-14 ENCOUNTER — Other Ambulatory Visit: Payer: Self-pay | Admitting: Physician Assistant

## 2018-04-13 ENCOUNTER — Telehealth: Payer: Self-pay | Admitting: Cardiovascular Disease

## 2018-04-13 NOTE — Telephone Encounter (Signed)
I spoke with pt. He reports he is not having any skipped beats when he checks his pulse.  Has not been checking rate but does not feel it is fast or slow.  I told pt he should continue Coreg.  Pt would like to decrease amount of medications he is taking and plans to discuss at next office visit if there is a possibility of discontinuing Coreg in the future.  Pt will start counting heart rate and let us know if consistently below 60 or above 100.

## 2018-04-13 NOTE — Telephone Encounter (Signed)
Pt c/o medication issue:  1. Name of Medication:  carvedilol (COREG) 3.125 MG tablet    2. How are you currently taking this medication (dosage and times per day)? TAKE 1 TABLET(3.125 MG) BY MOUTH TWICE DAILY  3. Are you having a reaction (difficulty breathing--STAT)? no  4. What is your medication issue? Pt verbalized that he want to know if he should continue the medication or if Dr.Nasher wants him off of it. He said that he has been checking his pluse in his wrist and he has not had any issues.

## 2018-06-07 ENCOUNTER — Ambulatory Visit: Payer: Medicare Other | Admitting: Cardiovascular Disease

## 2018-06-07 ENCOUNTER — Encounter: Payer: Self-pay | Admitting: Cardiovascular Disease

## 2018-06-07 VITALS — BP 116/64 | HR 64 | Ht 69.6 in | Wt 157.0 lb

## 2018-06-07 DIAGNOSIS — I2510 Atherosclerotic heart disease of native coronary artery without angina pectoris: Secondary | ICD-10-CM

## 2018-06-07 DIAGNOSIS — M1A0790 Idiopathic chronic gout, unspecified ankle and foot, without tophus (tophi): Secondary | ICD-10-CM

## 2018-06-07 NOTE — Progress Notes (Addendum)
Cardiology Office Note   Date:  7/[name]/Dean [name]   ID:  Dean [name], DOB [birth year], MRN [id number]  PCP:  [name], PA-C  Cardiologist:   Mertie Moores, MD   Chief Complaint  Patient presents with  . Coronary Artery Disease   Problem list 1. CAD  - CABG Nov. 2018 2.   Post of atrial fib  [name]   Hyperlipidemia 4. Gout 5. GERD      Dean [name] is a 72 y.o. male who presents for further evaluation of some chest pain and shortness of breath. I have reviewed records from [facility] center.  Has intermittant symptoms of DOE and CP Has occasional palpitations  Is hoarse at times  Has had a stress test - normal  Echo was normal .   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   Oct. 9, 2017:  Has started exercising some.   Is having gout issues which limits his exercise at time  Able to walk around without any significant CP or dyspnea.    Aug. 20, 2018  Dean [name] is seen back for follow-up visit for his premature ventricular contractions. Is taking Uloric for gouty tophi, Makes him nauseated, fatigued.   Has not had any exertional CP recently  Is on Indocin Saw his GP , saw something odd on ECG and he was sent here.  ECG here shows NSR , NS ST abn.  PVCs  Does not exercise,   No hx of CP or DOE Has rare episodes of CP when he is lying down .   [name], Dean [name]: Doing well from a cardiac standpont  Has had CABG , post op AF .   Nicely from a cardiac standpoint but then developed diverticulitis requiring surgery in mid March.  He was told by somebody during that hospitalization that he may have had a brief episode of atrial fibrillation.  There are no recorded episodes of atrial for ablation on the telemetry strips and no EKGs suggesting atrial fibrillation.   He related a  couple other complaints.  He has a bad taste in his mouth and has no appetite.  He also has had profound itching.  He thinks that it might be due to the warfarin therapy.  Dean [name], Dean [name]:  Dean [name] is seen today for follow-up of his coronary artery disease and postoperative atrial fibrillation. Feeling great,   Walks 6 days a week at a rapid pace. No CP or dyspnea   Several concerns  - has some tenderness along his SVG harvest site on his left lower leg. Has some sternal tenderness also .    Past Medical History:  Diagnosis Date  . Acid reflux    history of  . Anemia   . Atrial fibrillation ([place]) 10/2017   RVR  . Coronary artery disease   . Diverticulitis 11/26/2017  . Gout   . Hepatitis A age 35  . History of kidney stones   . Hyperlipemia   . Hypogonadism male   . Irregular heart beat   . Ischemic cardiomyopathy   . Non compliance with medical treatment   . Non-STEMI (non-ST elevated myocardial infarction) ([place]) 10/2017  . Pelvic abscess in male [facility])    percutaneous drain placement  Past Surgical History:  Procedure Laterality Date  . BLADDER REPAIR  [name]/15/Dean [name]   Procedure: BLADDER REPAIR;  Surgeon: Ileana Roup, MD;  Location: [facility];  Service: General;;  . BLADDER REPAIR  [name]/15/Dean [name]   Procedure: pelvic exploration, assist with bladder repair ;  Surgeon: Raynelle Bring, MD;  Location: [facility];  Service: Urology;;  . COLONOSCOPY  ~ 2014   Dr Ferdinand Lango in [facility].  Diverticulosis.  pt denies colon polyps.    . CORONARY ARTERY BYPASS GRAFT N/A 10/26/2017   Procedure: CORONARY ARTERY BYPASS GRAFTING (CABG) x four , using left internal mammary artery and bilateral legs greater saphenous vein harvested endoscopically;  Surgeon: Gaye Pollack, MD;  Location: [place] [facility];  Service: Open Heart Surgery;  Laterality: N/A;  . CYSTOSCOPY W/ URETERAL STENT PLACEMENT Bilateral [name]/15/Dean [name]   Procedure: CYSTOSCOPY WITH RETROGRADE PYELOGRAM/URETERAL STENT PLACEMENT;  Surgeon: Ardis Hughs, MD;  Location: [facility];  Service: Urology;  Laterality: Bilateral;  . IR CATHETER TUBE CHANGE  2/22/Dean [name]  . IR RADIOLOGIST EVAL & MGMT  1/16/Dean [name]  . LAPAROSCOPIC SIGMOID COLECTOMY N/A [name]/15/Dean [name]   Procedure: LAPAROSCOPIC SIGMOID COLECTOMY  ERAS PATHWAY TAKEDOWN OF COLOVESSICAL COLOCUTANEOUS FISTULA;  Surgeon: Ileana Roup, MD;  Location: [facility];  Service: General;  Laterality: N/A;  . LEFT HEART CATH AND CORONARY ANGIOGRAPHY N/A 10/25/2017   Procedure: LEFT HEART CATH AND CORONARY ANGIOGRAPHY;  Surgeon: Leonie Man, MD;  Location: [place] [facility];  Service: Cardiovascular;  Laterality: N/A;  . SIGMOIDOSCOPY N/A [name]/15/Dean [name]   Procedure: FLEX SIGMOIDOSCOPY;  Surgeon: Ileana Roup, MD;  Location: [facility];  Service: General;  Laterality: N/A;  . TEE WITHOUT CARDIOVERSION N/A 10/26/2017   Procedure: TRANSESOPHAGEAL ECHOCARDIOGRAM (TEE);  Surgeon: Gaye Pollack, MD;  Location: [place];  Service: Open Heart Surgery;  Laterality: N/A;     Current Outpatient Medications  Medication Sig Dispense Refill  . acetaminophen (TYLENOL) 650 MG CR tablet Take 650 mg by mouth every 8 (eight) hours as needed for pain.     Marland Kitchen aspirin EC 81 MG EC tablet Take 1 tablet (81 mg total) by mouth daily.    . carvedilol (COREG) [name].125 MG tablet TAKE 1 TABLET([name].125 MG) BY MOUTH TWICE DAILY 180 tablet 0  . Cholecalciferol (VITAMIN D3) 5000 units CAPS Take 5,000 Units daily by mouth.     . colchicine 0.6 MG tablet Take 0.6 mg by mouth daily as needed (gout).     . febuxostat (ULORIC) 40 MG tablet Take 40 mg by mouth daily with lunch.     . hydrocortisone cream 1 % Apply 1 application topically as needed for itching. Do NOT apply to sternal wound 30 g 0   No current facility-administered medications for this visit.     Allergies:   Crestor [rosuvastatin calcium]    Social History:  The patient  reports that he has quit smoking. He has never used smokeless tobacco. He reports that he drinks  alcohol. He reports that he does not use drugs.   Family History:  The patient's family history includes Alzheimer's disease in his maternal grandmother and mother; Hyperlipidemia in his brother and mother.    ROS:     Physical Exam: Blood pressure 116/64, pulse 64, height 5' 9.6" (1.768 m), weight 157 lb (71.2 kg), SpO2 98 %.  GEN:   Middle age man,   HEENT: Normal NECK: No JVD; No carotid bruits LYMPHATICS: No lymphadenopathy CARDIAC: RRR , no murmurs, rubs, gallops RESPIRATORY:  Clear  to auscultation without rales, wheezing or rhonchi  ABDOMEN: Soft, non-tender, non-distended MUSCULOSKELETAL:  No edema; No deformity  SKIN: Warm and dry NEUROLOGIC:  Alert and oriented x [name]   EKG:      Recent Labs: 10/25/2017: TSH 5.811 11/26/2017: ALT 11 [name]/20/Dean [name]: BUN 11; Creatinine, Ser 1.04; Hemoglobin 8.1; Magnesium 1.8; Platelets 242; Potassium [name].9; Sodium 140    Lipid Panel    Component Value Date/Time   CHOL 128 10/25/2017 0341   TRIG 104 10/25/2017 0341   HDL 39 (L) 10/25/2017 0341   CHOLHDL [name].[name] 10/25/2017 0341   VLDL 21 10/25/2017 0341   LDLCALC 68 10/25/2017 0341      Wt Readings from Last [name] Encounters:  06/07/18 157 lb (71.2 kg)  03/08/18 147 lb 12.8 oz (67 kg)  02/22/18 150 lb 12.7 oz (68.4 kg)      Other studies Reviewed: Additional studies/ records that were reviewed today include: . Review of the above records demonstrates:   ASSESSMENT AND PLAN:  1.  CAD - s/p CABG  -   doing well,  Still healing up ,   Has lots of tenderness   2. Atrial fib;    Post op .,  No AF since then   [name].  Hyperlipidemia -   intolerent to statins.    Check labs today   4.  Gout:    At his request, we will check a uric acid level.  He is currently on Uloric which is associated with coronary artery disease, heart attack, sudden cardiac death.   Current medicines are reviewed at length with the patient today.  The patient does not have concerns regarding medicines.  The  following changes have been made:  no change  Labs/ tests ordered today include:   Orders Placed This Encounter  Procedures  . Uric acid  . Basic Metabolic Panel (BMET)  . Hepatic function panel  . Lipid Profile     Disposition:   Follow-up with me in 6 months     Mertie Moores, MD  7/[name]/Dean [name] 2:36 PM    [facility] [address], [place], [place]  [address] Phone: [phone]; Fax: [phone]

## 2018-06-07 NOTE — Patient Instructions (Addendum)
Medication Instructions:  Your physician recommends that you continue on your current medications as directed. Please refer to the Current Medication list given to you today.   Labwork: TODAY - uric acid level, cholesterol, basic metabolic panel, liver panel   Testing/Procedures: None Ordered   Follow-Up: Your physician wants you to follow-up in: 6 months with Dr. Acie Fredrickson. You will receive a reminder letter in the mail two months in advance. If you don't receive a letter, please call our office to schedule the follow-up appointment.   If you need a refill on your cardiac medications before your next appointment, please call your pharmacy.   Thank you for choosing [facility]! Christen Bame, RN [phone]

## 2018-06-08 LAB — LIPID PANEL
CHOL/HDL RATIO: 4.4 ratio (ref 0.0–5.0)
Cholesterol, Total: 213 mg/dL — ABNORMAL HIGH (ref 100–199)
HDL: 48 mg/dL (ref >39)
LDL Calculated: 142 mg/dL — ABNORMAL HIGH (ref 0–99)
TRIGLYCERIDES: 114 mg/dL (ref 0–149)
VLDL Cholesterol Cal: 23 mg/dL (ref 5–40)

## 2018-06-08 LAB — HEPATIC FUNCTION PANEL
ALK PHOS: 90 IU/L (ref 39–117)
ALT: 12 IU/L (ref 0–44)
AST: 19 IU/L (ref 0–40)
Albumin: 4.3 g/dL (ref 3.5–4.8)
Bilirubin Total: 0.4 mg/dL (ref 0.0–1.2)
Bilirubin, Direct: 0.13 mg/dL (ref 0.00–0.40)
Total Protein: 7.2 g/dL (ref 6.0–8.5)

## 2018-06-08 LAB — BASIC METABOLIC PANEL
BUN / CREAT RATIO: 20 (ref 10–24)
BUN: 24 mg/dL (ref 8–27)
CO2: 22 mmol/L (ref 20–29)
CREATININE: 1.23 mg/dL (ref 0.76–1.27)
Calcium: 9.6 mg/dL (ref 8.6–10.2)
Chloride: 106 mmol/L (ref 96–106)
GFR, EST AFRICAN AMERICAN: 68 mL/min/{1.73_m2} (ref >59)
GFR, EST NON AFRICAN AMERICAN: 59 mL/min/{1.73_m2} — AB (ref >59)
Glucose: 94 mg/dL (ref 65–99)
POTASSIUM: 4.6 mmol/L (ref 3.5–5.2)
SODIUM: 143 mmol/L (ref 134–144)

## 2018-06-08 LAB — URIC ACID: Uric Acid: 4.4 mg/dL (ref 3.7–8.6)

## 2018-06-26 ENCOUNTER — Telehealth: Payer: Self-pay | Admitting: Pharmacist

## 2018-06-26 NOTE — Telephone Encounter (Signed)
Dean [name],  Thank you for addressing his concerns. His LDL is 142.   I would predict that a strict diet and exercise program will result in a 10-20 point improvement. If he would qualify for a PCSK9 inhibitor, I would favor that. If he does not want to discuss a new medication for 6 months, that's his choice.

## 2018-06-26 NOTE — Telephone Encounter (Signed)
Pt called about referral to lipid clinic. He states he needs to know exactly which medication I would be starting. Advised that I have not reviewed chart. Demands that I review chart while on phone. Did so and discussed that has only failed one statin medication and now that LDL is >70 with history of CABG would need treatment for cholesterol to reduce risk. He states that he does not want to be on statin medication and does not wish to discuss them. He states that he has dramatically changed his eating habits and has been exercising as tolerated. He would like to try this for 6 months before starting a medication.  Requests that I send to Dr. Acie Fredrickson and Sharyn Lull for additional recommendations at this time. Advised if he had additional questions or would like to be scheduled in lipid clinic to call back. He states understanding.   Will route to Dr. Dahlia Client for additional recs/FYI.

## 2018-07-05 NOTE — Telephone Encounter (Signed)
Called patient and reviewed Dr. Elmarie Shiley advice with him. I reviewed the names of the PCSK9 inhibitors with him and answered questions to his satisfaction and to the extent of my knowledge. Patient agrees to come in for lipid clinic appointment and is scheduled for 8/1. He thanked me for calling and reviewing all of this information with him.

## 2018-07-06 ENCOUNTER — Ambulatory Visit (INDEPENDENT_AMBULATORY_CARE_PROVIDER_SITE_OTHER): Payer: Medicare Other

## 2018-07-06 DIAGNOSIS — E785 Hyperlipidemia, unspecified: Secondary | ICD-10-CM | POA: Insufficient documentation

## 2018-07-06 DIAGNOSIS — E782 Mixed hyperlipidemia: Secondary | ICD-10-CM

## 2018-07-06 MED ORDER — ATORVASTATIN CALCIUM 10 MG PO TABS: 10.0000 mg | ORAL_TABLET | Freq: Every day | ORAL | 11 refills | Status: DC

## 2018-07-06 MED ORDER — CARVEDILOL 3.125 MG PO TABS: 3.1250 mg | ORAL_TABLET | Freq: Two times a day (BID) | ORAL | 3 refills | Status: DC

## 2018-07-06 NOTE — Patient Instructions (Addendum)
Your LDL goal is less than 70. You are currently not at goal with a LDL of 142mg /dl  Start atorvastatin (Lipitor) 10mg  daily. This is the lowest dose of atorvastatin. We will try to increase your dose if you tolerate lower doses of atorvastatin.   You can also try CoQ10 200mg  daily to help the tolerability of atorvastatin.   We will recheck your cholesterol levels in 2-3 months.   If you have any muscle pain, tenderness, or other new side effects please call Megan at [phone]

## 2018-07-06 NOTE — Progress Notes (Signed)
Patient ID: Shariq Puig                 DOB: 01-Jan-1946                    MRN: [id number]     HPI: Efren Kross is a 72 y.o. male patient referred to lipid clinic by Dr. Acie Fredrickson. PMH is significant for CAD s/p CABG and NSTEMI 10/2017, a fib (post op, no episodes since), ischemic cardiomyopathy, and hyperlipemia. Patient has a history of statin intolerance (rosuvastatin 10mg  - knee pain; another statin many years ago with flu like symptoms) and presents today for further management.  Mr. Loran Senters feels well, states he feeling the "best I've felt in 20 years." He denies chest pain and shortness of breath.  He was concerned the only option left for his lipid lowering were PSK9 inhibitors and was hesitant about an injectable medication. He was relieved to know he could try more oral medications before having to go to an injectable. Patient also brought up concerns about leg cramps when exercising. His most recent potassium (7/19) was wnl and magnesium (3/19) also wnl. Patient was provided with counseling on stretching and ways to minimize cramps. He inquired about medications that could make statins more tolerable and was counseled on starting CoQ10 200mg  daily suggested for tolerability if he preferred. Patient is also on febuxostat for gout and aware of the cardiovascular risks. Reports he is following up with his PCP to decrease the dose, but very adamant about his severe gout pain and will not discontinue febuxostat. He reports taking allopurinol in the past and it not providing uric acid lowering. His most recent uric acid level is 4.4mg /dl.   Current Medications: none Intolerances: rosuvastatin 10mg  daily Risk Factors: CAD s/p CABG  LDL goal: 70mg /dL  Diet: recent 45lbs weight loss. Diet contains no red meat, fast food, or fried food. Eats salad and chicken mostly. He is very careful about what he eats.   Exercise: walks quickly 1 hour 6 days a week. Planning on starting biking in the fall.    Family History: Mother and brother with HLD.  Social History: Former smoker, occasional alcohol use, denies illicit drug use.  Labs: 06/07/18: TC 213, TG 114, HDL 48, LDL 142 (no therapy)  Past Medical History:  Diagnosis Date  . Acid reflux    history of  . Anemia   . Atrial fibrillation ([place]) 10/2017   RVR  . Coronary artery disease   . Diverticulitis 11/26/2017  . Gout   . Hepatitis A age 72  . History of kidney stones   . Hyperlipemia   . Hypogonadism male   . Irregular heart beat   . Ischemic cardiomyopathy   . Non compliance with medical treatment   . Non-STEMI (non-ST elevated myocardial infarction) ([place]) 10/2017  . Pelvic abscess in male [facility])    percutaneous drain placement    Current Outpatient Medications on File Prior to Visit  Medication Sig Dispense Refill  . acetaminophen (TYLENOL) 650 MG CR tablet Take 650 mg by mouth every 8 (eight) hours as needed for pain.     Marland Kitchen aspirin EC 81 MG EC tablet Take 1 tablet (81 mg total) by mouth daily.    . carvedilol (COREG) 3.125 MG tablet TAKE 1 TABLET(3.125 MG) BY MOUTH TWICE DAILY 180 tablet 0  . Cholecalciferol (VITAMIN D3) 5000 units CAPS Take 5,000 Units daily by mouth.     . colchicine 0.6 MG tablet Take 0.6  mg by mouth daily as needed (gout).     . febuxostat (ULORIC) 40 MG tablet Take 40 mg by mouth daily with lunch.     . hydrocortisone cream 1 % Apply 1 application topically as needed for itching. Do NOT apply to sternal wound 30 g 0   No current facility-administered medications on file prior to visit.     Allergies  Allergen Reactions  . Crestor [Rosuvastatin Calcium]     Reported intolerance to Lipitor in the past Crestor 40mg  patient reported muscle aches, dose reduced to rechallenge    Assessment/Plan:  1. Hyperlipidemia - Mr. Bunten's is not at goal of LDL <70 with a most recent LDL of 142. Start atorvastatin 10mg  daily with the goal of titrating as tolerability allows. Use CoQ10 200mg  daily to  help enhance tolerability of atorvastatin. Patient will call in one month to discuss if therapy is going well or sooner if side effects arise. Continue current level of exercise and current diet. Discussed stretching before walks to decrease muscle cramps.   Isaias Sakai, Sherian Rein D PGY1 Pharmacy Resident  Phone [phone] 07/06/2018      4:14 PM

## 2018-07-31 ENCOUNTER — Telehealth: Payer: Self-pay

## 2018-07-31 DIAGNOSIS — E782 Mixed hyperlipidemia: Secondary | ICD-10-CM

## 2018-07-31 NOTE — Telephone Encounter (Signed)
Patient was recently seen in clinic due to elevated LDL. He has previously been intolerant to rosuvastatin but was willing to try atorvastatin 10mg  daily with intention to titrate to high intensity. Patient tolerated 10mg  well and self titrated to 20mg  daily a week ago. He called clinic today to report pain in back and knees, as well as flu like symptoms on this dose. He stopped taking the medication two days ago and symptoms are resolving.   Plan: Stop atorvastatin 20mg  for 5 more days, then start atorvastatin 10mg  daily with no intention to titrate further. F/u labs in 4 weeks. Will consider Zetia or PCSK9-I therapy at that time if LDL not at goal of 70mg /dl. 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

## 2018-08-25 ENCOUNTER — Telehealth: Payer: Self-pay | Admitting: Pharmacist

## 2018-08-25 NOTE — Telephone Encounter (Signed)
Spoke with patient who states that he stopped the atorvastatin about 2 weeks ago due to pain in one knee. He states that this pain has persisted and is not improving, thus he is wondering if it could be related to the change from Brand Uloric to the generic instead of the statin. I advised that more than likely it is not related to the statin since he has been off for 2 week and seen no improvement. He state understanding. He will wait to hear from his rheumatologist on the brand generic switch and to see if the pain improves some before resuming his atorvastatin. He will call once he resumes so that labs can be scheduled. He is also aware to call with any issues. He states that he will try every statin available before going with injections for his cholesterol. Advised that there are still several options and we will help him find the one that works. He states understanding.

## 2018-09-04 ENCOUNTER — Other Ambulatory Visit: Payer: Medicare Other

## 2018-10-06 NOTE — Telephone Encounter (Signed)
Called pt to follow up - he restarted his atorvastatin 10mg  daily today. He reports his pain was not from changes in his gout medicine. He sees Dr Acie Fredrickson 1/10 for f/u visit and will update him on tolerability at that time. Advised pt that if he continues on atorvastatin, would plan to recheck his cholesterol at that visit.

## 2018-12-15 ENCOUNTER — Encounter: Payer: Self-pay | Admitting: Cardiovascular Disease

## 2018-12-15 ENCOUNTER — Ambulatory Visit: Payer: Medicare Other | Admitting: Cardiovascular Disease

## 2018-12-15 VITALS — BP 122/72 | HR 46 | Ht 69.5 in | Wt 169.1 lb

## 2018-12-15 DIAGNOSIS — I480 Paroxysmal atrial fibrillation: Secondary | ICD-10-CM | POA: Diagnosis not present

## 2018-12-15 DIAGNOSIS — M1A9XX0 Chronic gout, unspecified, without tophus (tophi): Secondary | ICD-10-CM

## 2018-12-15 DIAGNOSIS — I447 Left bundle-branch block, unspecified: Secondary | ICD-10-CM

## 2018-12-15 DIAGNOSIS — I2510 Atherosclerotic heart disease of native coronary artery without angina pectoris: Secondary | ICD-10-CM

## 2018-12-15 LAB — LIPID PANEL
Chol/HDL Ratio: 3.2 ratio (ref 0.0–5.0)
Cholesterol, Total: 152 mg/dL (ref 100–199)
HDL: 47 mg/dL (ref >39)
LDL CALC: 72 mg/dL (ref 0–99)
Triglycerides: 163 mg/dL — ABNORMAL HIGH (ref 0–149)
VLDL CHOLESTEROL CAL: 33 mg/dL (ref 5–40)

## 2018-12-15 LAB — BASIC METABOLIC PANEL
BUN / CREAT RATIO: 19 (ref 10–24)
BUN: 20 mg/dL (ref 8–27)
CO2: 23 mmol/L (ref 20–29)
CREATININE: 1.07 mg/dL (ref 0.76–1.27)
Calcium: 9.2 mg/dL (ref 8.6–10.2)
Chloride: 103 mmol/L (ref 96–106)
GFR calc non Af Amer: 70 mL/min/{1.73_m2} (ref >59)
GFR, EST AFRICAN AMERICAN: 81 mL/min/{1.73_m2} (ref >59)
Glucose: 91 mg/dL (ref 65–99)
POTASSIUM: 4.6 mmol/L (ref 3.5–5.2)
SODIUM: 139 mmol/L (ref 134–144)

## 2018-12-15 LAB — HEPATIC FUNCTION PANEL
ALT: 12 IU/L (ref 0–44)
AST: 20 IU/L (ref 0–40)
Albumin: 4.1 g/dL (ref 3.5–4.8)
Alkaline Phosphatase: 93 IU/L (ref 39–117)
BILIRUBIN, DIRECT: 0.14 mg/dL (ref 0.00–0.40)
Bilirubin Total: 0.5 mg/dL (ref 0.0–1.2)
TOTAL PROTEIN: 6.4 g/dL (ref 6.0–8.5)

## 2018-12-15 LAB — URIC ACID: URIC ACID: 5.1 mg/dL (ref 3.7–8.6)

## 2018-12-15 NOTE — Progress Notes (Signed)
Cardiology Office Note   Date:  12/15/2018   ID:  Dean [name], DOB [birth year], MRN [id number]  PCP:  [name], PA-C  Cardiologist:   Mertie Moores, MD   Chief Complaint  Patient presents with  . Coronary Artery Disease   Problem list 1. CAD  - CABG Nov. 2018 2.   Post of atrial fib  3   Hyperlipidemia 4. Gout 5. GERD      Dean [name] is a 73 y.o. male who presents for further evaluation of some chest pain and shortness of breath. I have reviewed records from [facility].  Has intermittant symptoms of DOE and CP Has occasional palpitations  Is hoarse at times  Has had a stress test - normal  Echo was normal .   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   Oct. 9, 2017:  Has started exercising some.   Is having gout issues which limits his exercise at time  Able to walk around without any significant CP or dyspnea.    Aug. 20, 2018  Dean [name] is seen back for follow-up visit for his premature ventricular contractions. Is taking Uloric for gouty tophi, Makes him nauseated, fatigued.   Has not had any exertional CP recently  Is on Indocin Saw his GP , saw something odd on ECG and he was sent here.  ECG here shows NSR , NS ST abn.  PVCs  Does not exercise,   No hx of CP or DOE Has rare episodes of CP when he is lying down .   March 08, 2018: Doing well from a cardiac standpont  Has had CABG , post op AF .   Nicely from a cardiac standpoint but then developed diverticulitis requiring surgery in mid March.  He was told by somebody during that hospitalization that he may have had a brief episode of atrial fibrillation.  There are no recorded episodes of atrial for ablation on the telemetry strips and no EKGs suggesting atrial fibrillation.   He related a  couple other complaints.  He has a bad taste in his mouth and has no appetite.  He also has had profound itching.  He thinks that it might be due to the warfarin therapy.  June 07, 2018:  Dean [name] is seen today for follow-up of his coronary artery disease and postoperative atrial fibrillation. Feeling great,   Walks 6 days a week at a rapid pace. No CP or dyspnea   Several concerns  - has some tenderness along his SVG harvest site on his left lower leg. Has some sternal tenderness also .   Jan. 10, 2020  Dean [name] is seen today for follow-up of his coronary artery disease and coronary artery bypass grafting.  He also has a history of hyperlipidemia and gout. No recent issues with gout.   Controlled with Uloric  Walks 1 hr a day ( walk / run)  No DOE on hills , no angina  Still has some chest wall tenderness. Has SVG harvest site numbness  Having back issues.    Wonders if its due to Atorvastatin .     Past Medical History:  Diagnosis Date  . Acid reflux    history of  . Anemia   .  Atrial fibrillation ([place]) 10/2017   RVR  . Coronary artery disease   . Diverticulitis 11/26/2017  . Gout   . Hepatitis A age 48  . History of kidney stones   . Hyperlipemia   . Hypogonadism male   . Irregular heart beat   . Ischemic cardiomyopathy   . Non compliance with medical treatment   . Non-STEMI (non-ST elevated myocardial infarction) ([place]) 10/2017  . Pelvic abscess in male [facility])    percutaneous drain placement    Past Surgical History:  Procedure Laterality Date  . BLADDER REPAIR  02/17/2018   Procedure: BLADDER REPAIR;  Surgeon: Ileana Roup, MD;  [facility];  Service: General;;  . BLADDER REPAIR  02/17/2018   Procedure: pelvic exploration, assist with bladder repair ;  Surgeon: Raynelle Bring, MD;  [facility];  Service: Urology;;  . COLONOSCOPY  ~ 2014   Dr Ferdinand Lango in [facility].  Diverticulosis.  pt denies colon polyps.    . CORONARY ARTERY BYPASS GRAFT N/A  10/26/2017   Procedure: CORONARY ARTERY BYPASS GRAFTING (CABG) x four , using left internal mammary artery and bilateral legs greater saphenous vein harvested endoscopically;  Surgeon: Gaye Pollack, MD;  Location: [place] OR;  Service: Open Heart Surgery;  Laterality: N/A;  . CYSTOSCOPY W/ URETERAL STENT PLACEMENT Bilateral 02/17/2018   Procedure: CYSTOSCOPY WITH RETROGRADE PYELOGRAM/URETERAL STENT PLACEMENT;  Surgeon: Ardis Hughs, MD;  [facility];  Service: Urology;  Laterality: Bilateral;  . IR CATHETER TUBE CHANGE  01/27/2018  . IR RADIOLOGIST EVAL & MGMT  12/21/2017  . LAPAROSCOPIC SIGMOID COLECTOMY N/A 02/17/2018   Procedure: LAPAROSCOPIC SIGMOID COLECTOMY  ERAS PATHWAY TAKEDOWN OF COLOVESSICAL COLOCUTANEOUS FISTULA;  Surgeon: Ileana Roup, MD;  [facility];  Service: General;  Laterality: N/A;  . LEFT HEART CATH AND CORONARY ANGIOGRAPHY N/A 10/25/2017   Procedure: LEFT HEART CATH AND CORONARY ANGIOGRAPHY;  Surgeon: Leonie Man, MD;  Location: [place] CV LAB;  Service: Cardiovascular;  Laterality: N/A;  . SIGMOIDOSCOPY N/A 02/17/2018   Procedure: FLEX SIGMOIDOSCOPY;  Surgeon: Ileana Roup, MD;  [facility];  Service: General;  Laterality: N/A;  . TEE WITHOUT CARDIOVERSION N/A 10/26/2017   Procedure: TRANSESOPHAGEAL ECHOCARDIOGRAM (TEE);  Surgeon: Gaye Pollack, MD;  Location: [place];  Service: Open Heart Surgery;  Laterality: N/A;     Current Outpatient Medications  Medication Sig Dispense Refill  . acetaminophen (TYLENOL) 650 MG CR tablet Take 650 mg by mouth every 8 (eight) hours as needed for pain.     Marland Kitchen aspirin EC 81 MG EC tablet Take 1 tablet (81 mg total) by mouth daily.    Marland Kitchen atorvastatin (LIPITOR) 10 MG tablet Take 1 tablet (10 mg total) by mouth daily. 30 tablet 11  . carvedilol (COREG) 3.125 MG tablet Take 1 tablet (3.125 mg total) by mouth 2 (two) times daily with a meal. 180 tablet 3  . Cholecalciferol (VITAMIN D3) 5000 units CAPS Take  5,000 Units daily by mouth.     . febuxostat (ULORIC) 40 MG tablet Take 20 mg by mouth daily with lunch.     . hydrocortisone cream 1 % Apply 1 application topically as needed for itching. Do NOT apply to sternal wound 30 g 0   No current facility-administered medications for this visit.     Allergies:   Crestor [rosuvastatin calcium]    Social History:  The patient  reports that he has quit smoking. He has never used smokeless tobacco. He  reports current alcohol use. He reports that he does not use drugs.   Family History:  The patient's family history includes Alzheimer's disease in his maternal grandmother and mother; Hyperlipidemia in his brother and mother.    ROS:     Physical Exam: Blood pressure 122/72, pulse (!) 46, height 5' 9.5" (1.765 m), weight 169 lb 1.9 oz (76.7 kg), SpO2 98 %.  GEN:  Well nourished, well developed in no acute distress HEENT: Normal NECK: No JVD; No carotid bruits LYMPHATICS: No lymphadenopathy CARDIAC: RRR , no murmurs, rubs, gallops RESPIRATORY:  Clear to auscultation without rales, wheezing or rhonchi  ABDOMEN: Soft, non-tender, non-distended MUSCULOSKELETAL:  No edema; No deformity  SKIN: Warm and dry NEUROLOGIC:  Alert and oriented x 3   EKG:    December 15, 2018: Sinus bradycardia at a rate of 46.  First-degree AV block.  Left bundle branch block.  Recent Labs: 02/22/2018: Hemoglobin 8.1; Magnesium 1.8; Platelets 242 06/07/2018: ALT 12; BUN 24; Creatinine, Ser 1.23; Potassium 4.6; Sodium 143    Lipid Panel    Component Value Date/Time   CHOL 213 (H) 06/07/2018 1352   TRIG 114 06/07/2018 1352   HDL 48 06/07/2018 1352   CHOLHDL 4.4 06/07/2018 1352   CHOLHDL 3.3 10/25/2017 0341   VLDL 21 10/25/2017 0341   LDLCALC 142 (H) 06/07/2018 1352      Wt Readings from Last 3 Encounters:  12/15/18 169 lb 1.9 oz (76.7 kg)  06/07/18 157 lb (71.2 kg)  03/08/18 147 lb 12.8 oz (67 kg)      Other studies Reviewed: Additional studies/ records  that were reviewed today include: . Review of the above records demonstrates:   ASSESSMENT AND PLAN:  1.  CAD - s/p CABG  -  Doing well .   He continues to have some chest wall tenderness as well as some saphenous vein harvest site numbness and tenderness. Probably seems to be doing well.   2. Atrial fib;   Maintained normal sinus rhythm.  Continue current medications.  3.  Hyperlipidemia -continue atorvastatin.  Will check fasting labs today. Had some issues with back pain.  I have asked him to check with his primary medical doctor to see if the back issues can be sorted out.  I have given him the okay to stop his atorvastatin for a couple of weeks to see if that improves the back pain.   Current medicines are reviewed at length with the patient today.  The patient does not have concerns regarding medicines.  The following changes have been made:  no change  Labs/ tests ordered today include:   No orders of the defined types were placed in this encounter.    Disposition:   Follow-up with me in 6 months     Mertie Moores, MD  12/15/2018 10:29 AM    [facility] [address], [place], [place]  [address] Phone: [phone]; Fax: [phone]

## 2018-12-15 NOTE — Patient Instructions (Signed)
Medication Instructions:  Your physician recommends that you continue on your current medications as directed. Please refer to the Current Medication list given to you today. u If you need a refill on your cardiac medications before your next appointment, please call your pharmacy.   Lab work: TODAY - uric acid, cholesterol, liver panel, basic metabolic panel  If you have labs (blood work) drawn today and your tests are completely normal, you will receive your results only by: Marland Kitchen MyChart Message (if you have MyChart) OR . A paper copy in the mail If you have any lab test that is abnormal or we need to change your treatment, we will call you to review the results.   Testing/Procedures: None Ordered   Follow-Up: At [facility], you and your health needs are our priority.  As part of our continuing mission to provide you with exceptional heart care, we have created designated Provider Care Teams.  These Care Teams include your primary Cardiologist (physician) and Advanced Practice Providers (APPs -  Physician Assistants and Nurse Practitioners) who all work together to provide you with the care you need, when you need it. You will need a follow up appointment in:  6 months.  Please call our office 2 months in advance to schedule this appointment.  You may see Mertie Moores, MD or one of the following Advanced Practice Providers on your designated Care Team: Richardson Dopp, PA-C [place], [place] . Daune Perch, NP

## 2019-05-02 ENCOUNTER — Telehealth: Payer: Self-pay | Admitting: Cardiovascular Disease

## 2019-05-02 NOTE — Telephone Encounter (Signed)
Left message for patient to call back  

## 2019-05-02 NOTE — Telephone Encounter (Signed)
New Message              Patient is calling for some medical advise, he is concerned about Covid 34

## 2019-05-02 NOTE — Telephone Encounter (Signed)
Patient is returning a call. °

## 2019-05-02 NOTE — Telephone Encounter (Signed)
Called patient back. Patient wants to know with his CAD, does this mean if he gets the [place], is this a death sentence for him?  Informed patient that with his age and CAD that he is at higher risk, but that does not mean it is a death sentence for him. Patient is concerned about coming in for lab work. Not sure what lab work patient is referring to, no future orders at this time. Informed patient that Dr. Elmarie Shiley nurse will give him a call about lab work. Patient also wanted to know if he could have some lab work done here that another doctor needs. Informed patient that if he needs lab work with Dr. Acie Fredrickson we can order it for [place] and he can go to any [facility] and have all his blood work done. Will forward to Dr. Elmarie Shiley nurse to follow up.

## 2019-05-03 NOTE — Telephone Encounter (Signed)
Spoke with patient and answered his questions to his satisfaction about his heart function and risk of exposure to Covid-19. He is due for follow-up visit with Dr. Acie Fredrickson which I scheduled for July 13. He asks that lab work be done at the same time as labs needed by his rheumatologist and advised that we are in agreement to sending orders for lipid/liver/bmet or scheduling tests based on his preference; I advised lab work is not needed right away. Patient verbalized understanding and agreement with plan and thanked me for the call.

## 2019-05-30 ENCOUNTER — Encounter: Payer: Self-pay | Admitting: Gastroenterology

## 2019-06-15 ENCOUNTER — Encounter: Payer: Self-pay | Admitting: Cardiovascular Disease

## 2019-06-18 ENCOUNTER — Encounter: Payer: Self-pay | Admitting: Cardiovascular Disease

## 2019-06-18 ENCOUNTER — Other Ambulatory Visit: Payer: Self-pay

## 2019-06-18 ENCOUNTER — Telehealth (INDEPENDENT_AMBULATORY_CARE_PROVIDER_SITE_OTHER): Payer: Medicare Other | Admitting: Cardiovascular Disease

## 2019-06-18 VITALS — BP 121/74 | HR 46 | Temp 97.4°F | Ht 69.5 in | Wt 175.0 lb

## 2019-06-18 DIAGNOSIS — Z951 Presence of aortocoronary bypass graft: Secondary | ICD-10-CM

## 2019-06-18 DIAGNOSIS — I2510 Atherosclerotic heart disease of native coronary artery without angina pectoris: Secondary | ICD-10-CM | POA: Diagnosis not present

## 2019-06-18 DIAGNOSIS — E782 Mixed hyperlipidemia: Secondary | ICD-10-CM | POA: Diagnosis not present

## 2019-06-18 DIAGNOSIS — Z7189 Other specified counseling: Secondary | ICD-10-CM

## 2019-06-18 NOTE — Patient Instructions (Signed)
Medication Instructions:  Your physician recommends that you continue on your current medications as directed. Please refer to the Current Medication list given to you today.  If you need a refill on your cardiac medications before your next appointment, please call your pharmacy.   Lab work: Your physician recommends that you return for lab work on Thursday July 16. You may come into our office anytime after 7:30 am for your lab work.  You will need to FAST for this appointment - nothing to eat or drink after midnight the night before except water.   If you have labs (blood work) drawn today and your tests are completely normal, you will receive your results only by: Marland Kitchen MyChart Message (if you have MyChart) OR . A paper copy in the mail If you have any lab test that is abnormal or we need to change your treatment, we will call you to review the results.   Testing/Procedures: None Ordered   Follow-Up: At [facility], you and your health needs are our priority.  As part of our continuing mission to provide you with exceptional heart care, we have created designated Provider Care Teams.  These Care Teams include your primary Cardiologist (physician) and Advanced Practice Providers (APPs -  Physician Assistants and Nurse Practitioners) who all work together to provide you with the care you need, when you need it. You will need a follow up appointment in:  6 months.  Please call our office 2 months in advance to schedule this appointment.  You may see Mertie Moores, MD or one of the following Advanced Practice Providers on your designated Care Team: Richardson Dopp, PA-C [place], [place] . Daune Perch, NP

## 2019-06-18 NOTE — Progress Notes (Signed)
Virtual Visit via Telephone Note   This visit type was conducted due to national recommendations for restrictions regarding the COVID-19 Pandemic (e.g. social distancing) in an effort to limit this patient's exposure and mitigate transmission in our community.  Due to his co-morbid illnesses, this patient is at least at moderate risk for complications without adequate follow up.  This format is felt to be most appropriate for this patient at this time.  The patient did not have access to video technology/had technical difficulties with video requiring transitioning to audio format only (telephone).  All issues noted in this document were discussed and addressed.  No physical exam could be performed with this format.  Please refer to the patient's chart for his  consent to telehealth for [facility].   Date:  06/18/2019   ID:  Dean [name], DOB [birth year], MRN [id number]  Patient Location: Home Provider Location: Office  PCP:  Dean Hummingbird, PA-C  Cardiologist:  Mertie Moores, MD  Electrophysiologist:  None   Problem list 1. CAD  - CABG Nov. 2018 2.   Post of atrial fib  [name]   Hyperlipidemia 4. Gout 5. GERD      Dean [name] is a 73 y.o. male who presents for further evaluation of some chest pain and shortness of breath. I have reviewed records from [facility].  Has intermittant symptoms of DOE and CP Has occasional palpitations  Is hoarse at times  Has had a stress test - normal  Echo was normal .   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   Oct. 9, 2017:  Has started exercising some.   Is having gout issues which limits his exercise at time  Able to walk around without any significant CP or dyspnea.    Aug. 20, 2018  [name] is seen back for follow-up visit  for his premature ventricular contractions. Is taking Uloric for gouty tophi, Makes him nauseated, fatigued.   Has not had any exertional CP recently  Is on Indocin Saw his GP , saw something odd on ECG and he was sent here.  ECG here shows NSR , NS ST abn.  PVCs  Does not exercise,   No hx of CP or DOE Has rare episodes of CP when he is lying down .   [name], 2019: Doing well from a cardiac standpont  Has had CABG , post op AF .   Nicely from a cardiac standpoint but then developed diverticulitis requiring surgery in mid March.  He was told by somebody during that hospitalization that he may have had a brief episode of atrial fibrillation.  There are no recorded episodes of atrial for ablation on the telemetry strips and no EKGs suggesting atrial fibrillation.   He related a couple other complaints.  He has a bad taste in his mouth and has no appetite.  He also has had profound itching.  He thinks that it might be due to the warfarin therapy.  Dean [name], 2019:  Dean [name] is seen [name] for follow-up of his coronary artery disease and postoperative atrial fibrillation. Feeling great,   Walks 6 days a week at a rapid pace. No CP or dyspnea   Several concerns  - has some tenderness along  his SVG harvest site on his left lower leg. Has some sternal tenderness also .   Jan. 10, 2020  Dean [name] is seen [name] for follow-up of his coronary artery disease and coronary artery bypass grafting.  He also has a history of hyperlipidemia and gout. No recent issues with gout.   Controlled with Uloric  Walks 1 hr a day ( walk / run)  No DOE on hills , no angina  Still has some chest wall tenderness. Has SVG harvest site numbness  Having back issues.    Wonders if its due to Atorvastatin .    Evaluation Performed:  Follow-Up Visit  Chief Complaint:  CAD   History of Present Illness:    Dean [name] is a 73 y.o. male with  CAD .   Walking [name]. 8 miles a day at a fast  pace  No  angina  No syncope or presyncope No dyspnea   Lipids Dec 15, 2018 look good   The patient does not have symptoms concerning for COVID-19 infection (fever, chills, cough, or new shortness of breath).    Past Medical History:  Diagnosis Date   Acid reflux    history of   Anemia    Atrial fibrillation ([place]) 10/2017   RVR   Coronary artery disease    Diverticulitis 11/26/2017   Gout    Hepatitis A age 69   History of kidney stones    Hyperlipemia    Hypogonadism male    Irregular heart beat    Ischemic cardiomyopathy    Non compliance with medical treatment    Non-STEMI (non-ST elevated myocardial infarction) ([place]) 10/2017   Pelvic abscess in male [facility])    percutaneous drain placement   Past Surgical History:  Procedure Laterality Date   BLADDER REPAIR  [name]/15/2019   Procedure: BLADDER REPAIR;  Surgeon: Ileana Roup, MD;  [facility];  Service: General;;   BLADDER REPAIR  [name]/15/2019   Procedure: pelvic exploration, assist with bladder repair ;  Surgeon: Raynelle Bring, MD;  [facility];  Service: Urology;;   COLONOSCOPY  ~ 2014   Dr Ferdinand Lango in [facility].  Diverticulosis.  pt denies colon polyps.     CORONARY ARTERY BYPASS GRAFT N/A 10/26/2017   Procedure: CORONARY ARTERY BYPASS GRAFTING (CABG) x four , using left internal mammary artery and bilateral legs greater saphenous vein harvested endoscopically;  Surgeon: Gaye Pollack, MD;  Location: [place] OR;  Service: Open Heart Surgery;  Laterality: N/A;   CYSTOSCOPY W/ URETERAL STENT PLACEMENT Bilateral [name]/15/2019   Procedure: CYSTOSCOPY WITH RETROGRADE PYELOGRAM/URETERAL STENT PLACEMENT;  Surgeon: Ardis Hughs, MD;  [facility];  Service: Urology;  Laterality: Bilateral;   IR CATHETER TUBE CHANGE  01/27/2018   IR RADIOLOGIST EVAL & MGMT  12/21/2017   LAPAROSCOPIC SIGMOID COLECTOMY N/A [name]/15/2019   Procedure: LAPAROSCOPIC SIGMOID COLECTOMY  ERAS PATHWAY TAKEDOWN OF COLOVESSICAL  COLOCUTANEOUS FISTULA;  Surgeon: Ileana Roup, MD;  [facility];  Service: General;  Laterality: N/A;   LEFT HEART CATH AND CORONARY ANGIOGRAPHY N/A 10/25/2017   Procedure: LEFT HEART CATH AND CORONARY ANGIOGRAPHY;  Surgeon: Leonie Man, MD;  Location: [place] CV LAB;  Service: Cardiovascular;  Laterality: N/A;   SIGMOIDOSCOPY N/A [name]/15/2019   Procedure: FLEX SIGMOIDOSCOPY;  Surgeon: Ileana Roup, MD;  [facility];  Service: General;  Laterality: N/A;   TEE WITHOUT CARDIOVERSION N/A 10/26/2017   Procedure: TRANSESOPHAGEAL ECHOCARDIOGRAM (TEE);  Surgeon: Gaye Pollack, MD;  Location:  [place] OR;  Service: Open Heart Surgery;  Laterality: N/A;     Current Meds  Medication Sig   allopurinol (ZYLOPRIM) 100 MG tablet Take 1 tablet by mouth daily.   aspirin EC 81 MG EC tablet Take 1 tablet (81 mg total) by mouth daily.   atorvastatin (LIPITOR) 10 MG tablet Take 1 tablet (10 mg total) by mouth daily.   carvedilol (COREG) [name].125 MG tablet Take 1 tablet ([name].125 mg total) by mouth 2 (two) times daily with a meal.   Cholecalciferol (VITAMIN D3) 5000 units CAPS Take 5,000 Units by mouth as needed.    hydrocortisone cream 1 % Apply 1 application topically as needed for itching. Do NOT apply to sternal wound     Allergies:   Crestor [rosuvastatin calcium]   Social History   Tobacco Use   Smoking status: Former Smoker   Smokeless tobacco: Never Used  Substance Use Topics   Alcohol use: Yes    Alcohol/week: 0.0 standard drinks    Comment: once monthly   Drug use: No     Family Hx: The patient's family history includes Alzheimer's disease in his maternal grandmother and mother; Hyperlipidemia in his brother and mother. There is no history of Colon cancer, Esophageal cancer, or Stomach cancer.  ROS:   Please see the history of present illness.     All other systems reviewed and are negative.   Prior CV studies:   The following studies were reviewed  [name]:    Labs/Other Tests and Data Reviewed:    EKG:  No ECG reviewed.  Recent Labs: 12/15/2018: ALT 12; BUN 20; Creatinine, Ser 1.07; Potassium 4.6; Sodium 139   Recent Lipid Panel Lab Results  Component Value Date/Time   CHOL 152 12/15/2018 10:43 AM   TRIG 163 (H) 12/15/2018 10:43 AM   HDL 47 12/15/2018 10:43 AM   CHOLHDL [name].2 12/15/2018 10:43 AM   CHOLHDL [name].[name] 10/25/2017 03:41 AM   LDLCALC 72 12/15/2018 10:43 AM    Wt Readings from Last [name] Encounters:  06/18/19 175 lb (79.4 kg)  12/15/18 169 lb 1.9 oz (76.7 kg)  06/07/18 157 lb (71.2 kg)     Objective:    Vital Signs:  BP 121/74    Pulse (!) 46    Temp (!) 97.4 F (36.[name] C)    Ht 5' 9.5" (1.765 m)    Wt 175 lb (79.4 kg)    BMI 25.47 kg/m      ASSESSMENT & PLAN:    1. CAD / CABG :   Doing well.   Walks [name].8 miles a day .  No symptoms .  EF was 30-35%.    No echo since that time.  No CHF symptoms.   Will need to get lipids checked soon.   2.  Covid education:   Discussed in length.   Wears a mask,  Social distancing .  Recommended   COVID-19 Education: The signs and symptoms of COVID-19 were discussed with the patient and how to seek care for testing (follow up with PCP or arrange E-visit).  The importance of social distancing was discussed [name].  Time:   [name], I have spent  21 minutes with the patient with telehealth technology discussing the above problems.     Medication Adjustments/Labs and Tests Ordered: Current medicines are reviewed at length with the patient [name].  Concerns regarding medicines are outlined above.   Tests Ordered: No orders of the defined types were placed in this encounter.   Medication Changes:  No orders of the defined types were placed in this encounter.   Follow Up:  Virtual Visit or In Person in 6 month(s)  Signed, Mertie Moores, MD  06/18/2019 9:57 AM    [place]

## 2019-06-19 ENCOUNTER — Telehealth: Payer: Self-pay | Admitting: Cardiovascular Disease

## 2019-06-19 DIAGNOSIS — Z79899 Other long term (current) drug therapy: Secondary | ICD-10-CM

## 2019-06-19 DIAGNOSIS — M1A9XX1 Chronic gout, unspecified, with tophus (tophi): Secondary | ICD-10-CM

## 2019-06-19 NOTE — Telephone Encounter (Signed)
New Message    Patient returning your call, and also have some information he wants to give to you.

## 2019-06-20 NOTE — Telephone Encounter (Signed)
Left message for patient to call back  

## 2019-06-21 ENCOUNTER — Other Ambulatory Visit: Payer: Medicare Other

## 2019-06-21 NOTE — Telephone Encounter (Signed)
See my chart message.  CBC with diff added to lab work. AST and Creatinine already ordered in hepatic panel and BMP

## 2019-06-25 ENCOUNTER — Other Ambulatory Visit: Payer: Self-pay

## 2019-06-25 ENCOUNTER — Other Ambulatory Visit: Payer: Medicare Other | Admitting: *Deleted

## 2019-06-25 DIAGNOSIS — Z79899 Other long term (current) drug therapy: Secondary | ICD-10-CM

## 2019-06-25 DIAGNOSIS — E782 Mixed hyperlipidemia: Secondary | ICD-10-CM

## 2019-06-25 DIAGNOSIS — Z951 Presence of aortocoronary bypass graft: Secondary | ICD-10-CM

## 2019-06-25 DIAGNOSIS — I2510 Atherosclerotic heart disease of native coronary artery without angina pectoris: Secondary | ICD-10-CM

## 2019-06-25 DIAGNOSIS — M1A9XX1 Chronic gout, unspecified, with tophus (tophi): Secondary | ICD-10-CM

## 2019-06-26 LAB — HEPATIC FUNCTION PANEL
ALT: 16 IU/L (ref 0–44)
AST: 25 IU/L (ref 0–40)
Albumin: 4.7 g/dL (ref 3.7–4.7)
Alkaline Phosphatase: 93 IU/L (ref 39–117)
Bilirubin Total: 0.6 mg/dL (ref 0.0–1.2)
Bilirubin, Direct: 0.16 mg/dL (ref 0.00–0.40)
Total Protein: 7.1 g/dL (ref 6.0–8.5)

## 2019-06-26 LAB — CBC WITH DIFFERENTIAL/PLATELET
Basophils Absolute: 0 10*3/uL (ref 0.0–0.2)
Basos: 1 %
EOS (ABSOLUTE): 0.2 10*3/uL (ref 0.0–0.4)
Eos: 4 %
Hematocrit: 42.8 % (ref 37.5–51.0)
Hemoglobin: 14.2 g/dL (ref 13.0–17.7)
Immature Grans (Abs): 0 10*3/uL (ref 0.0–0.1)
Immature Granulocytes: 0 %
Lymphocytes Absolute: 3.2 10*3/uL — ABNORMAL HIGH (ref 0.7–3.1)
Lymphs: 54 %
MCH: 33 pg (ref 26.6–33.0)
MCHC: 33.2 g/dL (ref 31.5–35.7)
MCV: 100 fL — ABNORMAL HIGH (ref 79–97)
Monocytes Absolute: 0.5 10*3/uL (ref 0.1–0.9)
Monocytes: 9 %
Neutrophils Absolute: 1.9 10*3/uL (ref 1.4–7.0)
Neutrophils: 32 %
Platelets: 141 10*3/uL — ABNORMAL LOW (ref 150–450)
RBC: 4.3 x10E6/uL (ref 4.14–5.80)
RDW: 13.1 % (ref 11.6–15.4)
WBC: 6 10*3/uL (ref 3.4–10.8)

## 2019-06-26 LAB — LIPID PANEL
Chol/HDL Ratio: 3.5 ratio (ref 0.0–5.0)
Cholesterol, Total: 177 mg/dL (ref 100–199)
HDL: 50 mg/dL (ref >39)
LDL Calculated: 106 mg/dL — ABNORMAL HIGH (ref 0–99)
Triglycerides: 106 mg/dL (ref 0–149)
VLDL Cholesterol Cal: 21 mg/dL (ref 5–40)

## 2019-06-26 LAB — BASIC METABOLIC PANEL
BUN/Creatinine Ratio: 15 (ref 10–24)
BUN: 19 mg/dL (ref 8–27)
CO2: 26 mmol/L (ref 20–29)
Calcium: 9.5 mg/dL (ref 8.6–10.2)
Chloride: 100 mmol/L (ref 96–106)
Creatinine, Ser: 1.23 mg/dL (ref 0.76–1.27)
GFR calc Af Amer: 68 mL/min/{1.73_m2} (ref >59)
GFR calc non Af Amer: 59 mL/min/{1.73_m2} — ABNORMAL LOW (ref >59)
Glucose: 101 mg/dL — ABNORMAL HIGH (ref 65–99)
Potassium: 5.3 mmol/L — ABNORMAL HIGH (ref 3.5–5.2)
Sodium: 139 mmol/L (ref 134–144)

## 2019-06-27 ENCOUNTER — Telehealth: Payer: Self-pay | Admitting: Pharmacist

## 2019-06-27 NOTE — Telephone Encounter (Signed)
Left message with patient to return call. Need to schedule lipid clinic appointment to discuss PCSK9 inhibitors

## 2019-06-28 NOTE — Telephone Encounter (Signed)
Left message for pt again.

## 2019-06-29 NOTE — Telephone Encounter (Signed)
Spoke with pt, appt scheduled for next week.

## 2019-07-03 ENCOUNTER — Telehealth: Payer: Self-pay | Admitting: Pharmacist

## 2019-07-03 NOTE — Telephone Encounter (Signed)
Left message to return call for covid screen

## 2019-07-05 ENCOUNTER — Ambulatory Visit (INDEPENDENT_AMBULATORY_CARE_PROVIDER_SITE_OTHER): Payer: Medicare Other | Admitting: Pharmacist

## 2019-07-05 ENCOUNTER — Other Ambulatory Visit: Payer: Self-pay

## 2019-07-05 DIAGNOSIS — Z789 Other specified health status: Secondary | ICD-10-CM

## 2019-07-05 DIAGNOSIS — E782 Mixed hyperlipidemia: Secondary | ICD-10-CM | POA: Diagnosis not present

## 2019-07-05 MED ORDER — EZETIMIBE 10 MG PO TABS: 5.0000 mg | ORAL_TABLET | Freq: Every day | ORAL | 3 refills | Status: DC

## 2019-07-05 NOTE — Progress Notes (Signed)
Patient ID: Dean [name]                 DOB: 28-Jan-1946                    MRN: [id number]     HPI: Dean [name] is a 73 y.o. male patient referred to lipid clinic by Dr Acie Fredrickson. PMH is significant for CAD s/p NSTEMI and CABG 10/2017, post op afib, HLD, chest pain, SOB, and gout. Pt was previously seen in lipid clinic in August 2019. He has been taking highest tolerated dose of atorvastatin which is 10mg  daily, however LDL remains above goal. He presents today for further management.  Pt is currently tolerating atorvastatin 10mg  daily, previously experienced myalgias on higher dosing. He is also intolerant to rosuvastatin. 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 He is also in the process of undergoing extensive dental work - attributes this to high dose prednisone he previously used for gout flares. He is healing well from his bowel resection last year. Gout is also under better control now and he just takes allopurinol. Diet and exercise are both excellent.  Current Medications: atorvastatin 10mg  daily Intolerances: rosuvastatin 10mg  daily - knee pain, atorvastatin 20mg  daily - back and knee pain, flu like sx Risk Factors: CAD s/p NSTEMI and CABG LDL goal: 70mg /dL  Diet: Diet contains no red meat, fast food, or fried food. Eats salad and chicken mostly. He is very careful about what he eats.   Exercise: Walks for an hour and 15 minutes a day at a fast pace.  Family History: The patient's family history includes Alzheimer's disease in his maternal grandmother and mother; Hyperlipidemia in his brother and mother. There is no history of Colon cancer, Esophageal cancer, or Stomach cancer.  Social History: Former smoker, occasional alcohol use, no drug use.  Labs: 06/25/19: TC 177, TG 106, HDL 50, LDL 106 (atorvastatin 10mg  daily) 06/07/18: TC 213, TG 114, HDL 48, LDL 142 (no therapy)  Past Medical History:   Diagnosis Date  . Acid reflux    history of  . Anemia   . Atrial fibrillation ([place]) 10/2017   RVR  . Coronary artery disease   . Diverticulitis 11/26/2017  . Gout   . Hepatitis A age 69  . History of kidney stones   . Hyperlipemia   . Hypogonadism male   . Irregular heart beat   . Ischemic cardiomyopathy   . Non compliance with medical treatment   . Non-STEMI (non-ST elevated myocardial infarction) ([place]) 10/2017  . Pelvic abscess in male [facility])    percutaneous drain placement    Current Outpatient Medications on File Prior to Visit  Medication Sig Dispense Refill  . allopurinol (ZYLOPRIM) 100 MG tablet Take 1 tablet by mouth daily.    Marland Kitchen aspirin EC 81 MG EC tablet Take 1 tablet (81 mg total) by mouth daily.    Marland Kitchen atorvastatin (LIPITOR) 10 MG tablet Take 1 tablet (10 mg total) by mouth daily. 30 tablet 11  . carvedilol (COREG) 3.125 MG tablet Take 1 tablet (3.125 mg total) by mouth 2 (two) times daily with a meal. 180 tablet 3  . Cholecalciferol (VITAMIN D3) 5000 units CAPS Take 5,000 Units by mouth as needed.     . hydrocortisone cream 1 % Apply 1 application topically as needed for itching. Do NOT apply to sternal wound 30 g  0   No current facility-administered medications on file prior to visit.     Allergies  Allergen Reactions  . Crestor [Rosuvastatin Calcium]     Reported intolerance to Lipitor in the past Crestor 40mg  patient reported muscle aches, dose reduced to rechallenge    Assessment/Plan:  1. Hyperlipidemia - LDL 106 on max tolerated dose of atorvastatin 10mg  daily, above goal < 70 due to hx of MI. Discussed Zetia, Nexletol, and PCSK9i therapy. Pt does not wish to start injectable medication until last line, will also avoid Nexletol due to patient's history of gout. He is willing to add Zetia 5mg  daily - lower dose should have better GI tolerability and similar efficacy as 10mg  tablet. Will continue atorvastatin 10mg  daily. Recheck lipids and LFTs in 3 months.   Megan E. Supple, PharmD, BCACP, [place] [address] [place], [place] [address] Phone: [phone]; Fax: [phone] 07/05/2019 8:53 AM

## 2019-07-05 NOTE — Patient Instructions (Addendum)
It was nice to see you today  Your LDL is currently 106, your goal is < 70  Continue taking atorvastatin 10mg  once a day  Start taking ezetimibe (Zetia) 5mg  (1/2 tablet) each day  Recheck fasting lab work in 3 months on Monday October 26th, any time after 7:30am

## 2019-07-06 ENCOUNTER — Other Ambulatory Visit: Payer: Self-pay | Admitting: Cardiovascular Disease

## 2019-07-30 IMAGING — DX DG CHEST 2V
2 series · 2 of 2 positions shown · non-contrast
Comparison: None.

CLINICAL DATA: Left chest pain beginning this morning.

EXAM:
CHEST  2 VIEW

[chest pa]
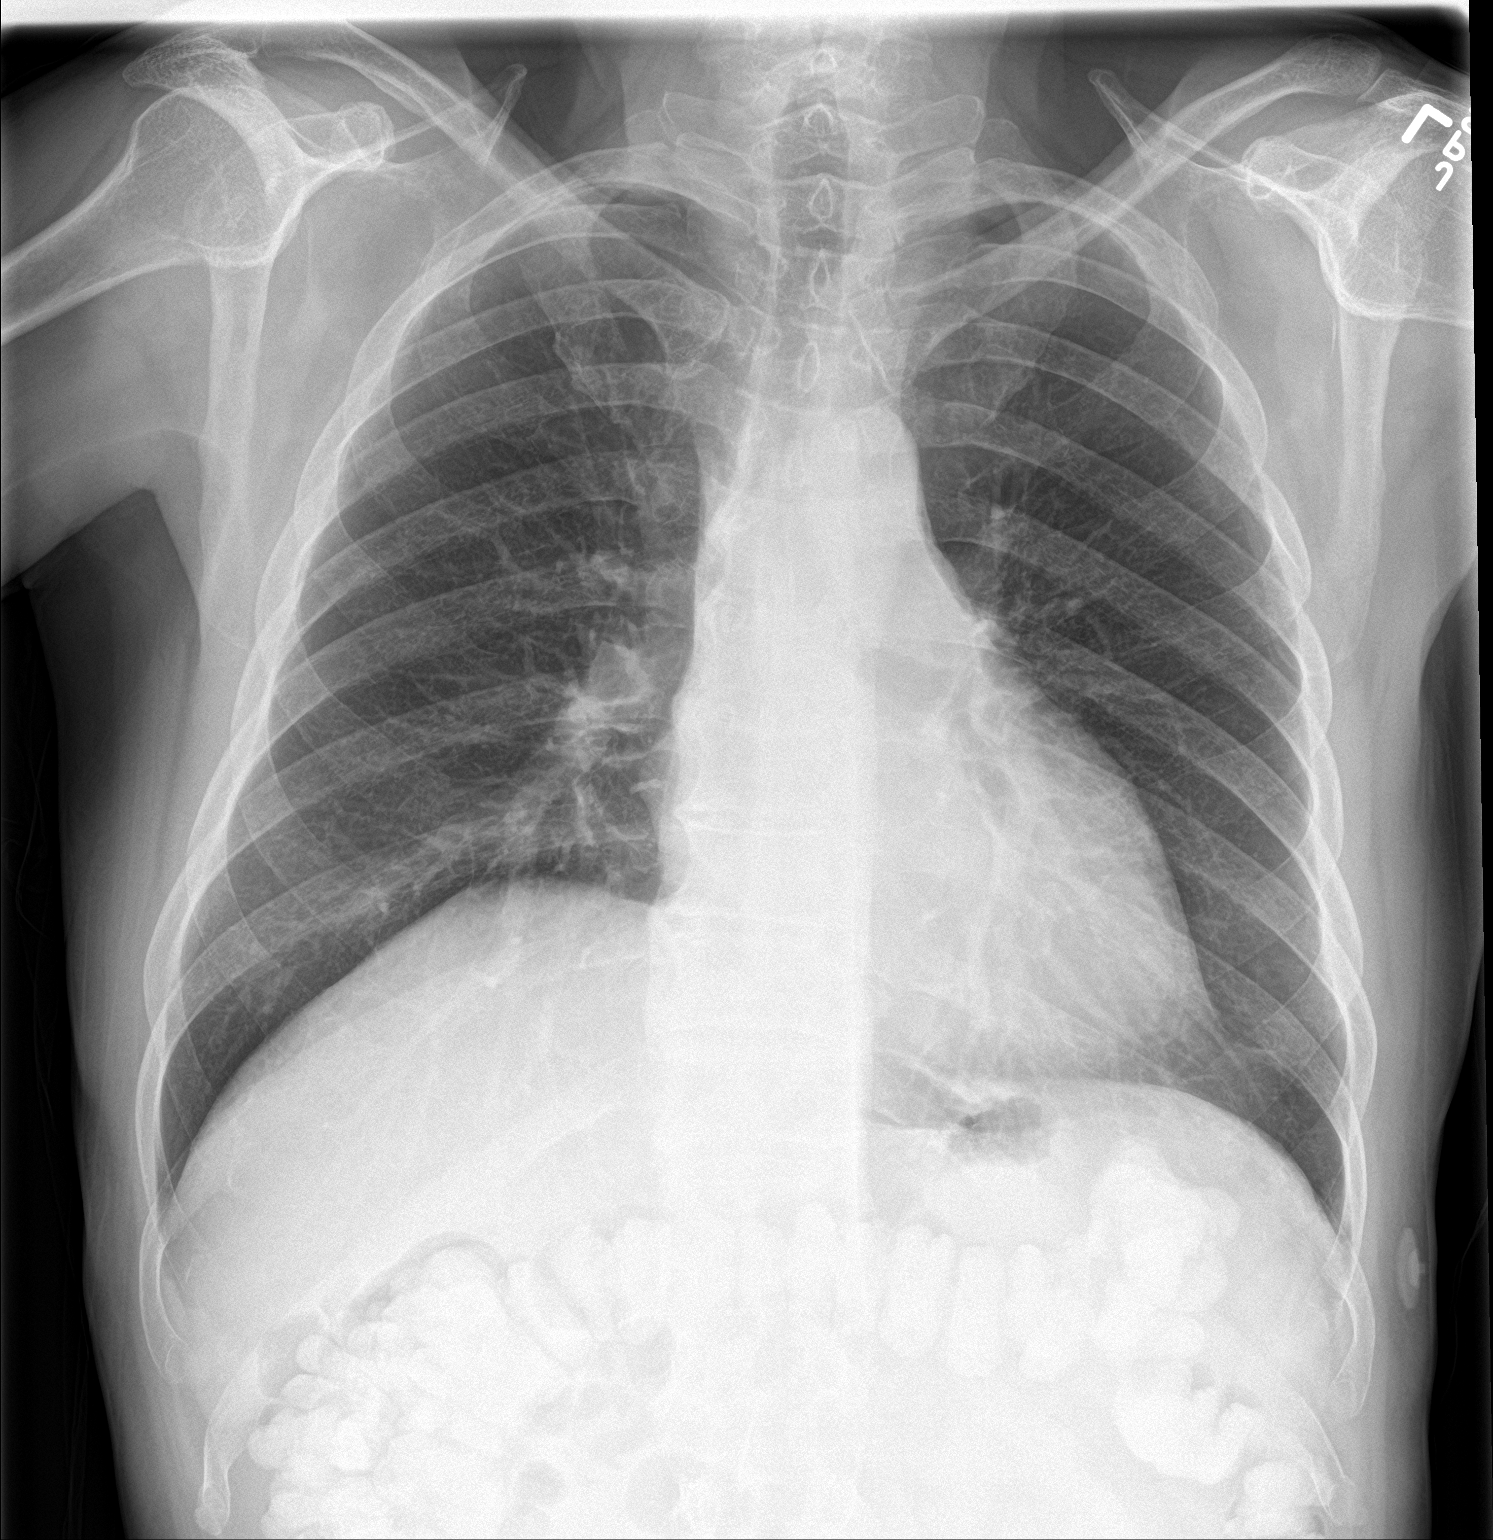

[chest lat]
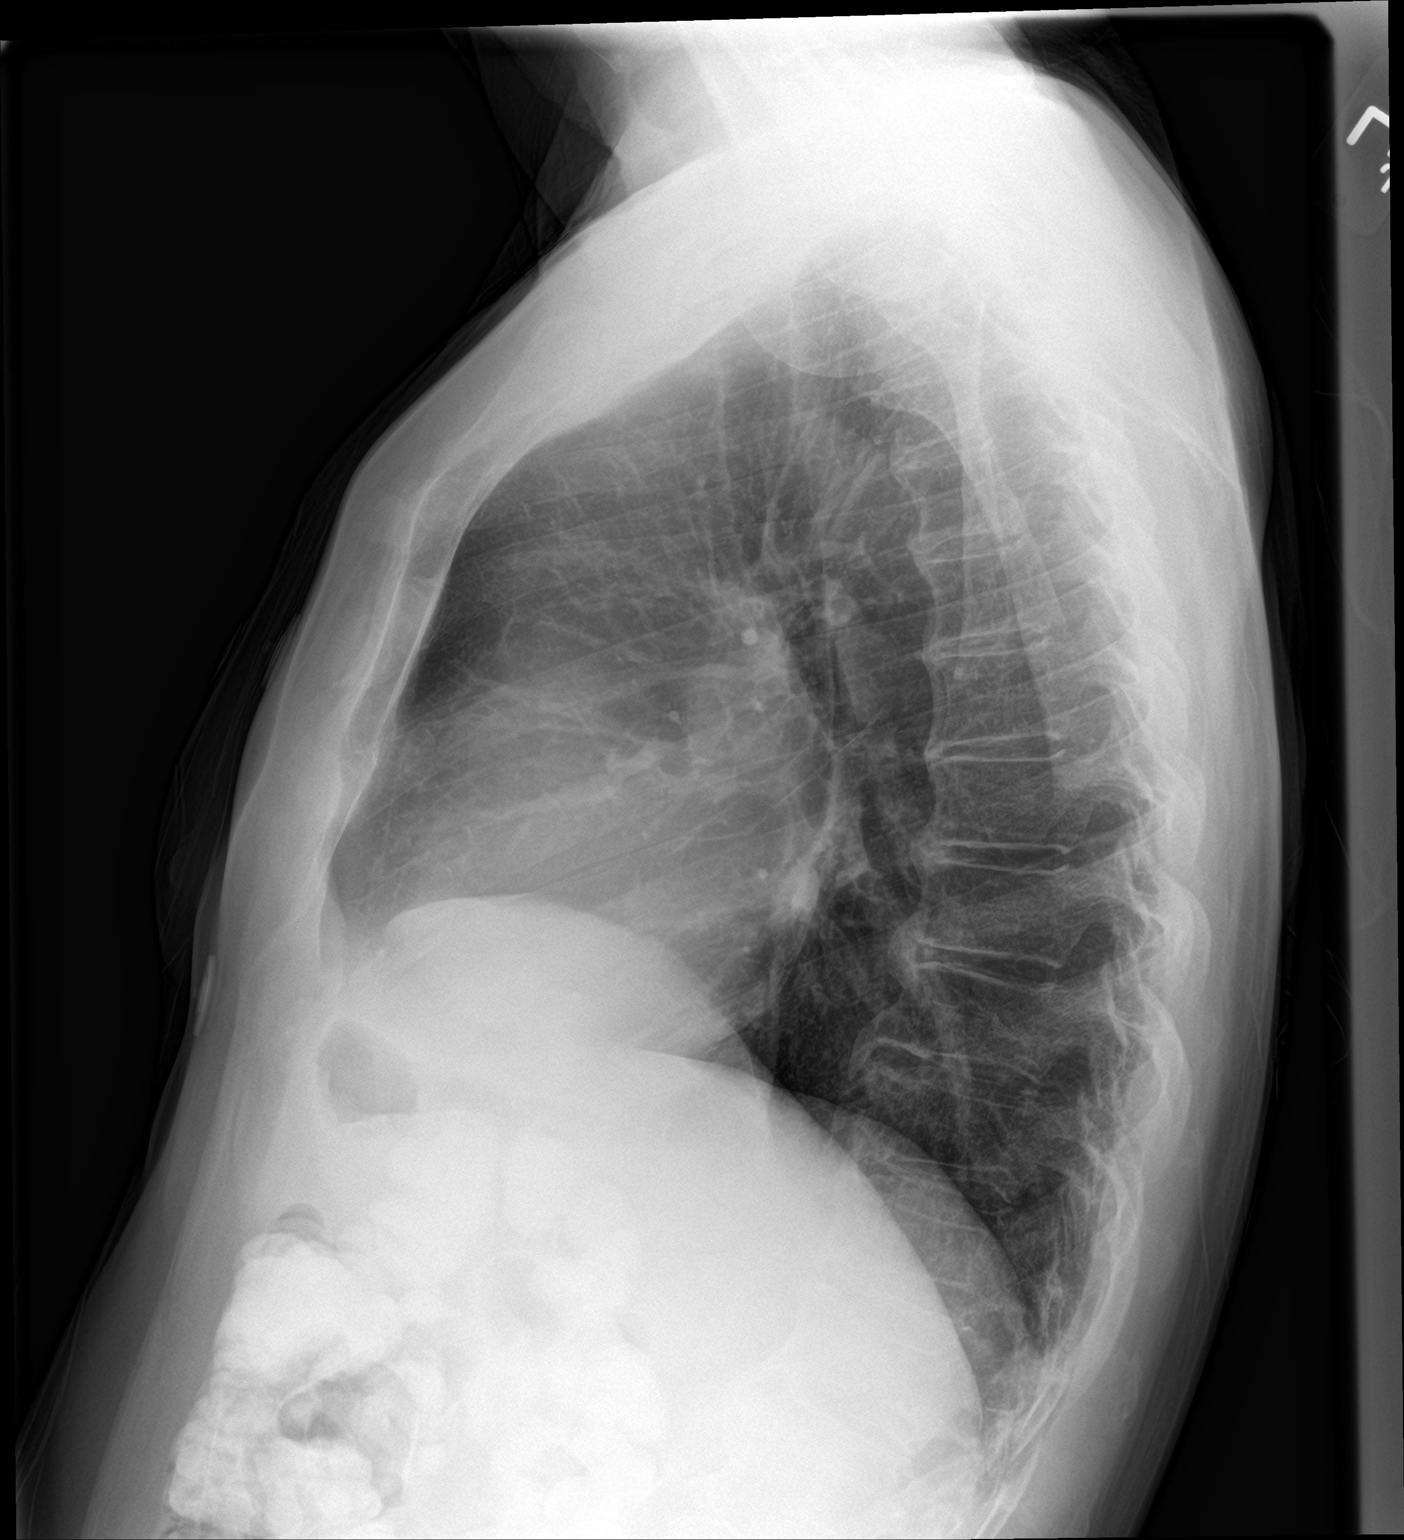

[2 of 2 positions shown; findings below may reference images not displayed]

FINDINGS: Lungs are clear. Heart size is normal. No pneumothorax or pleural
effusion. No focal bony abnormality. Contrast in the colon from CT
abdomen and pelvis 10/21/2017 noted.
IMPRESSION: No acute disease.

## 2019-08-01 IMAGING — DX DG CHEST 1V PORT
1 series · 1 of 1 positions shown · non-contrast
Comparison: 10/24/2017

CLINICAL DATA: Status post CABG x4.

EXAM:
PORTABLE CHEST 1 VIEW

[chest ap]
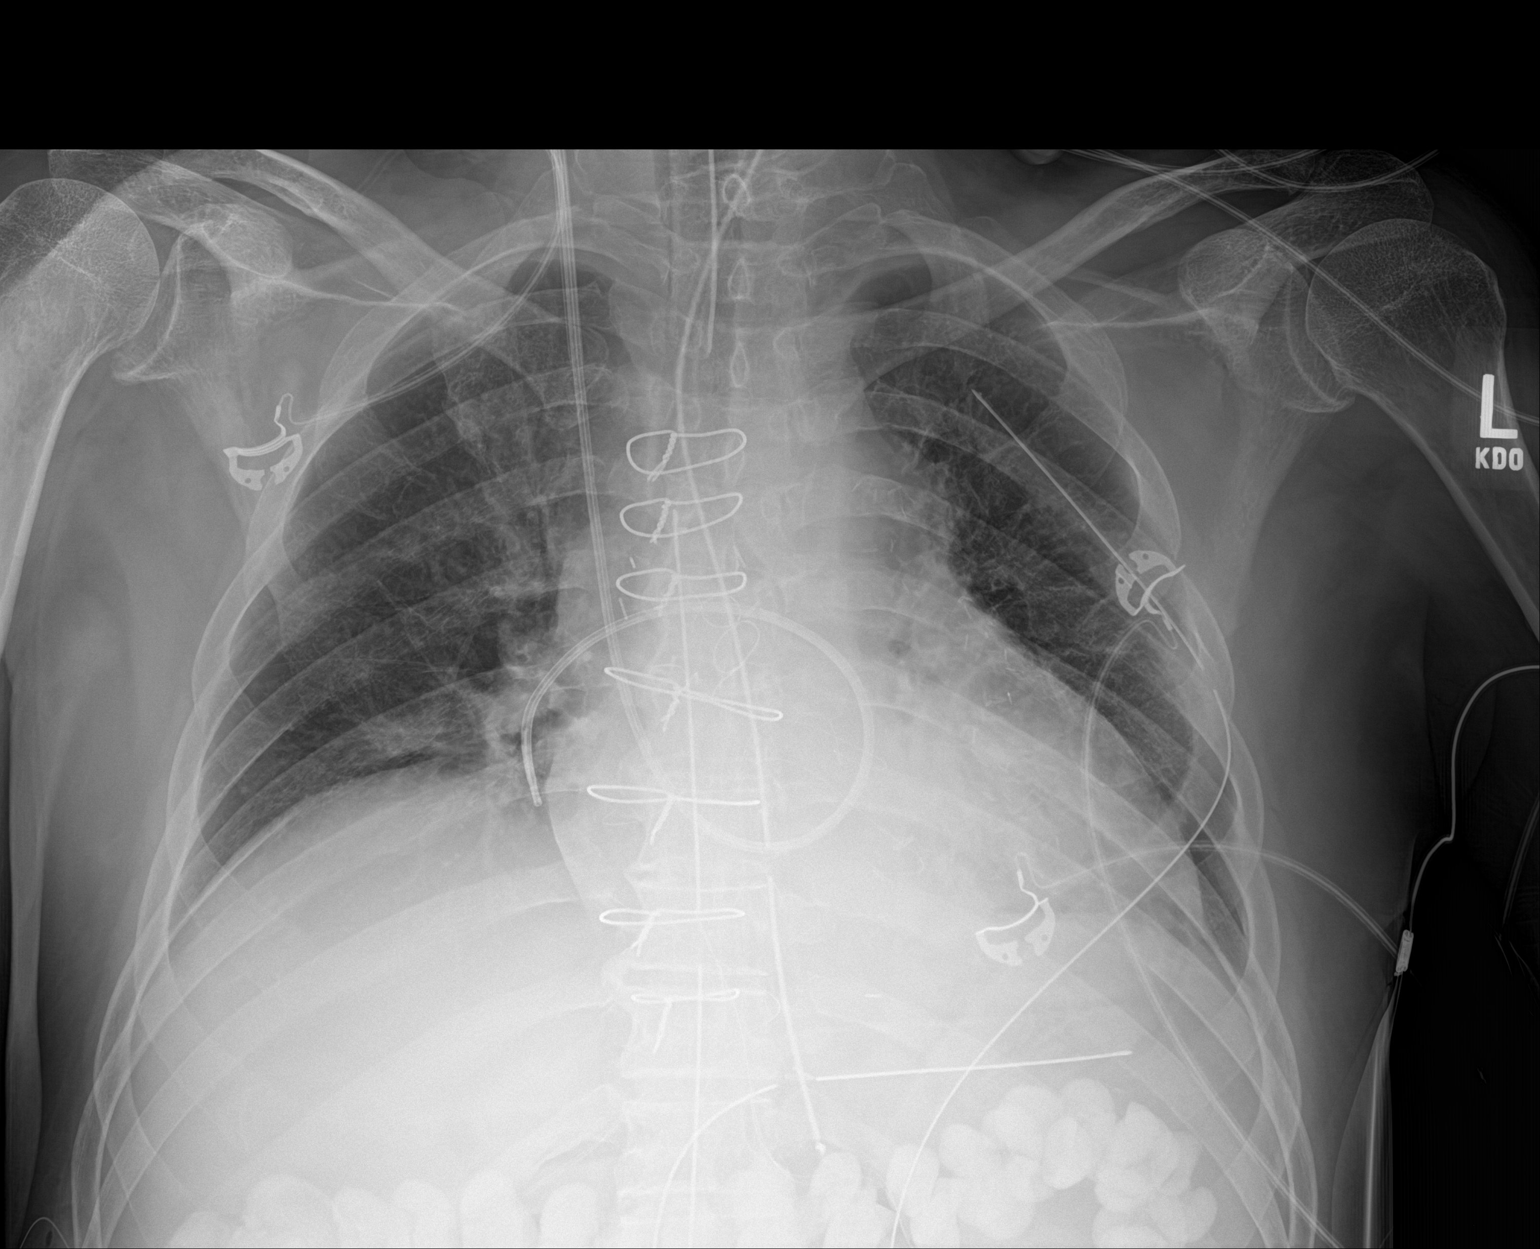

[1 of 1 positions shown; findings below may reference images not displayed]

FINDINGS: AP portable supine view the chest was provided.

Endotracheal tube tip is 5.5 cm above the carina in satisfactory
position.

Gastric tube side port is seen at the GE junction and further
advancement by at least 3-5 cm recommended.

Two left-sided chest tubes are seen, the more cephalad is seen with
the tip between the posterior left third and fourth ribs and
side-port adjacent to the lateral aspect of the left fourth rib. The
more caudal chest tube is seen along the left lung base.

Swan-Ganz catheter is seen with tip projecting projecting over the
right inferior hilum and pulmonary artery.

Intra aortic balloon pump projects over the thoracic spine extending
to the T5 level.

New post CABG change with median sternotomy sutures in place.
Top-normal size heart. No aortic aneurysm. No pneumothorax or
effusion. No pulmonary consolidation. Low lung volumes are noted.
IMPRESSION: 1. Satisfactory support lines and tube positions apart from the
gastric tube which needs further advancement by at least 3-5 cm.
2. New post CABG change of the heart and mediastinum.

## 2019-08-02 IMAGING — DX DG CHEST 1V PORT
1 series · 1 of 1 positions shown · non-contrast
Comparison: October 26, 2017

CLINICAL DATA: Status postextubation

EXAM:
PORTABLE CHEST 1 VIEW

[chest]
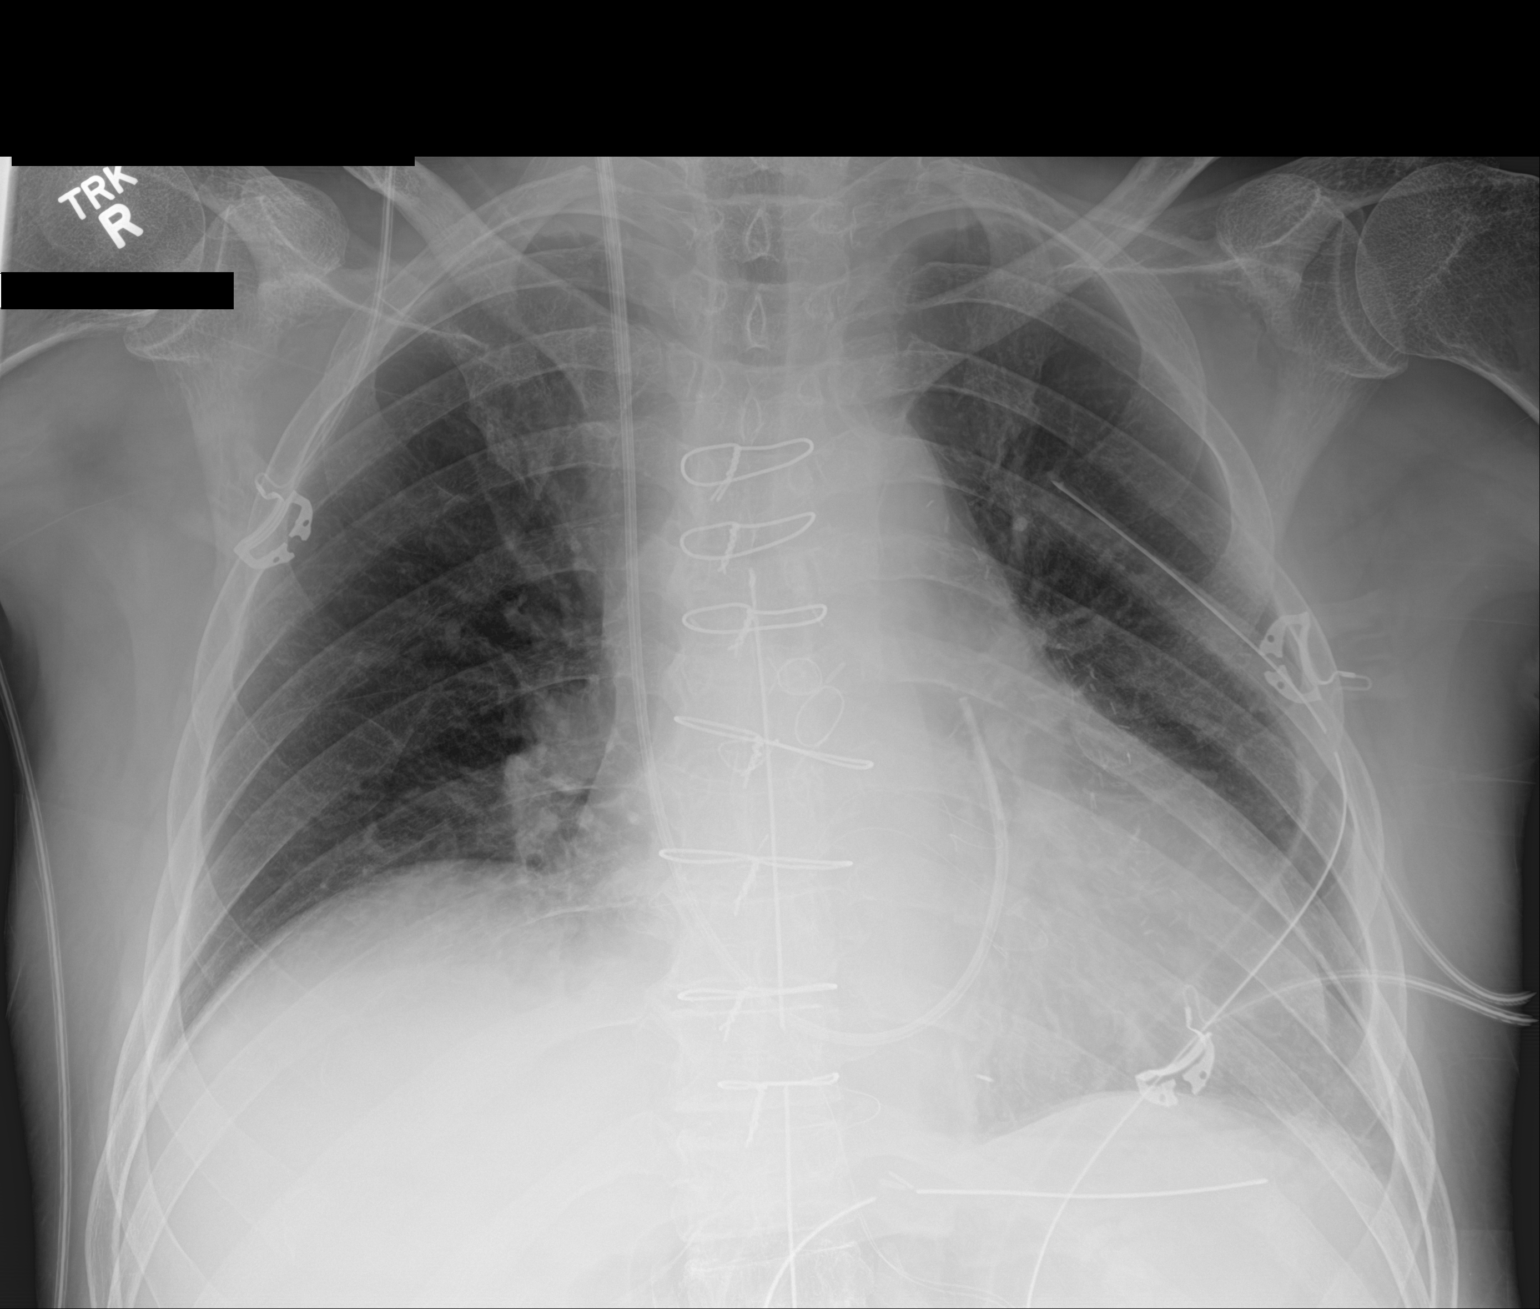

[1 of 1 positions shown; findings below may reference images not displayed]

FINDINGS: Endotracheal tube and nasogastric tube have been removed. Swan-Ganz
catheter tip is now in the main pulmonary outflow tract. There are
left chest tubes and a mediastinal drain. Temporary pacemaker wires
are attached to the right heart. No evident pneumothorax.

There is a minimal left pleural effusion with mild left base
atelectasis. Lungs elsewhere are clear. Heart is mildly enlarged
with pulmonary vascularity normal, stable. No adenopathy. No bone
lesions.
IMPRESSION: Tube and catheter positions as described without pneumothorax.
Slight left base atelectasis with small left pleural effusion. Lungs
elsewhere clear. Stable cardiac prominence.

## 2019-08-03 IMAGING — DX DG CHEST 1V PORT
1 series · 1 of 1 positions shown · non-contrast
Comparison: Chest x-ray from yesterday.

CLINICAL DATA: Status post CABG.

EXAM:
PORTABLE CHEST 1 VIEW

[chest ap]
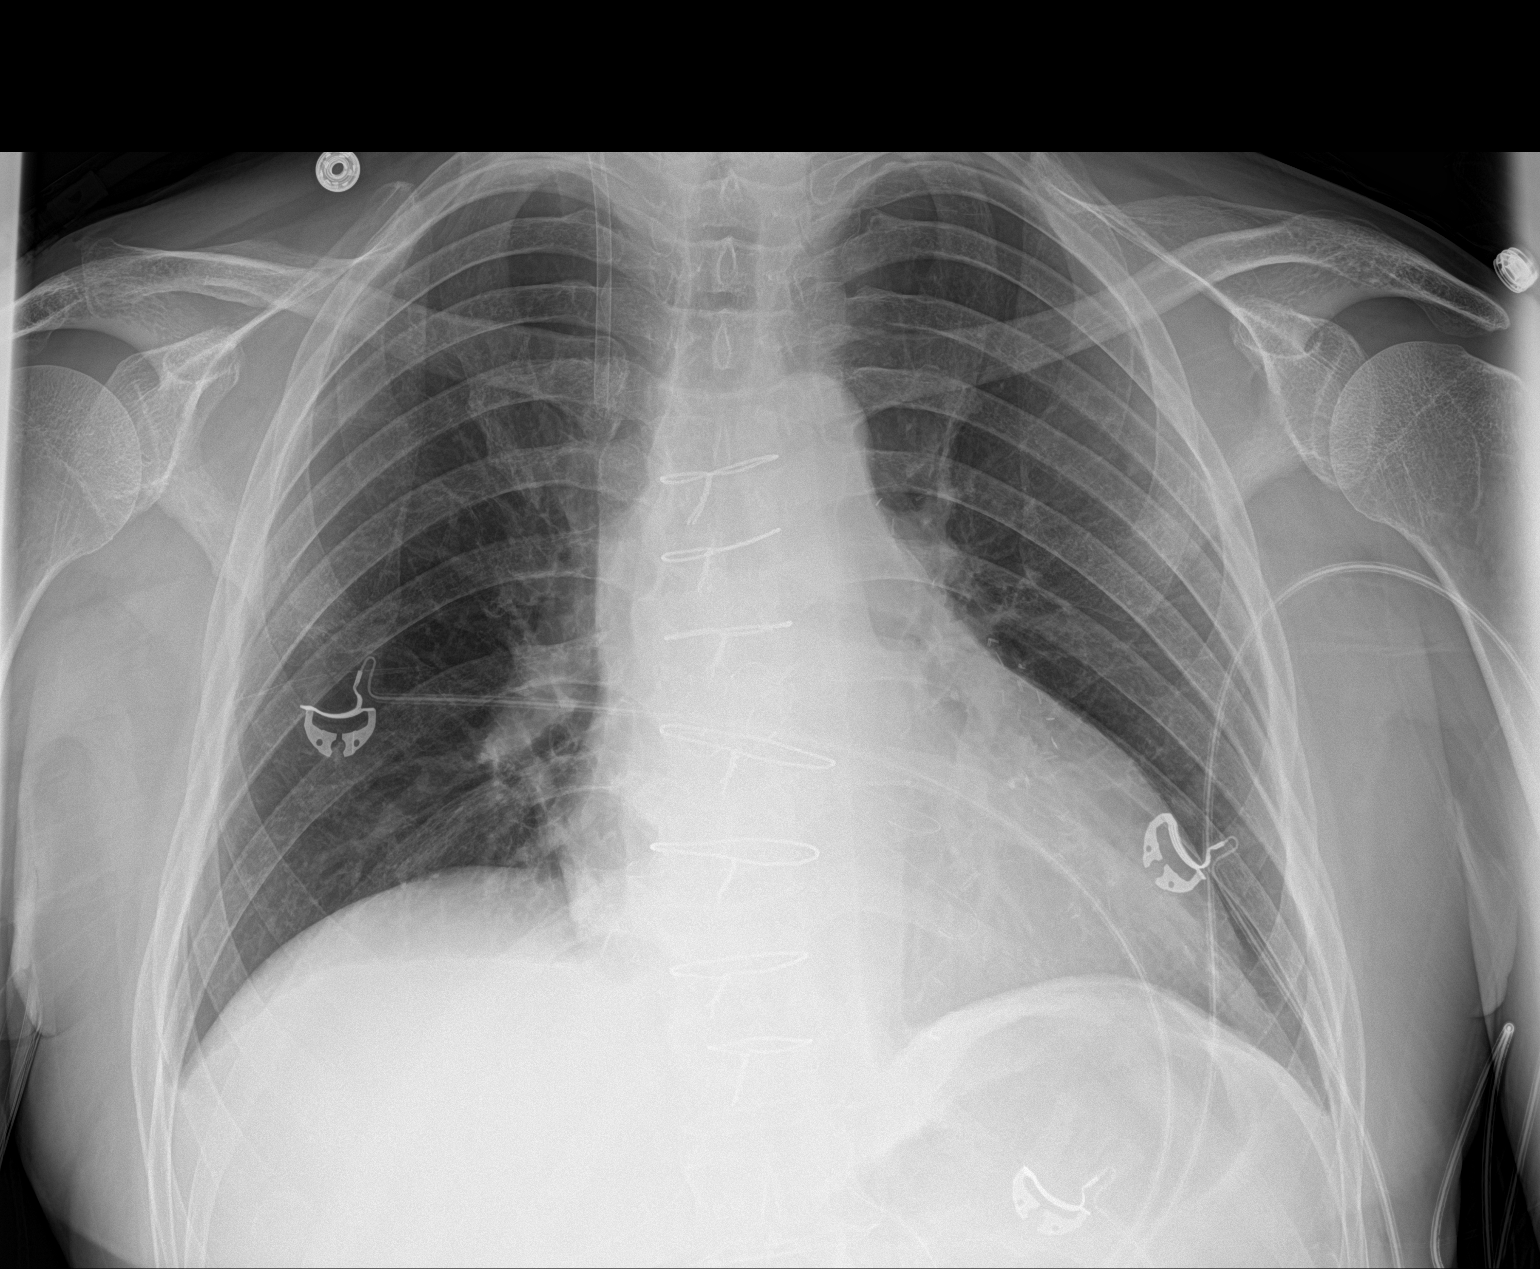

[1 of 1 positions shown; findings below may reference images not displayed]

FINDINGS: Interval removal of Swan-Ganz catheter, with residual right internal
jugular sheath. Mediastinal and left-sided chest tubes have also
been removed.

Stable cardiomediastinal silhouette status post CABG. Normal
pulmonary vascularity. No focal consolidation, pleural effusion, or
pneumothorax. No acute osseous abnormality.
IMPRESSION: Status post CABG. No active disease. Resolved small left pleural
effusion. No pneumothorax.

## 2019-08-21 ENCOUNTER — Other Ambulatory Visit: Payer: Self-pay | Admitting: Cardiovascular Disease

## 2019-09-26 ENCOUNTER — Other Ambulatory Visit: Payer: Self-pay | Admitting: Nurse Practitioner

## 2019-09-26 DIAGNOSIS — M1A9XX1 Chronic gout, unspecified, with tophus (tophi): Secondary | ICD-10-CM

## 2019-10-01 ENCOUNTER — Other Ambulatory Visit: Payer: Self-pay

## 2019-10-01 ENCOUNTER — Other Ambulatory Visit: Payer: Medicare Other | Admitting: *Deleted

## 2019-10-01 DIAGNOSIS — E782 Mixed hyperlipidemia: Secondary | ICD-10-CM

## 2019-10-01 DIAGNOSIS — M1A9XX1 Chronic gout, unspecified, with tophus (tophi): Secondary | ICD-10-CM

## 2019-10-01 LAB — HEPATIC FUNCTION PANEL
ALT: 12 IU/L (ref 0–44)
AST: 20 IU/L (ref 0–40)
Albumin: 4.3 g/dL (ref 3.7–4.7)
Alkaline Phosphatase: 101 IU/L (ref 39–117)
Bilirubin Total: 0.7 mg/dL (ref 0.0–1.2)
Bilirubin, Direct: 0.19 mg/dL (ref 0.00–0.40)
Total Protein: 7 g/dL (ref 6.0–8.5)

## 2019-10-01 LAB — LIPID PANEL
Chol/HDL Ratio: 3.2 ratio (ref 0.0–5.0)
Cholesterol, Total: 139 mg/dL (ref 100–199)
HDL: 44 mg/dL (ref >39)
LDL Chol Calc (NIH): 71 mg/dL (ref 0–99)
Triglycerides: 136 mg/dL (ref 0–149)
VLDL Cholesterol Cal: 24 mg/dL (ref 5–40)

## 2019-10-01 LAB — URIC ACID: Uric Acid: 6.6 mg/dL (ref 3.7–8.6)

## 2020-03-21 ENCOUNTER — Other Ambulatory Visit: Payer: Self-pay

## 2020-03-21 ENCOUNTER — Ambulatory Visit: Payer: Medicare Other | Admitting: Cardiovascular Disease

## 2020-03-21 ENCOUNTER — Encounter: Payer: Self-pay | Admitting: Cardiovascular Disease

## 2020-03-21 VITALS — BP 114/82 | HR 54 | Ht 69.5 in | Wt 177.6 lb

## 2020-03-21 DIAGNOSIS — I480 Paroxysmal atrial fibrillation: Secondary | ICD-10-CM | POA: Diagnosis not present

## 2020-03-21 DIAGNOSIS — M1A0790 Idiopathic chronic gout, unspecified ankle and foot, without tophus (tophi): Secondary | ICD-10-CM

## 2020-03-21 DIAGNOSIS — I214 Non-ST elevation (NSTEMI) myocardial infarction: Secondary | ICD-10-CM

## 2020-03-21 DIAGNOSIS — I2510 Atherosclerotic heart disease of native coronary artery without angina pectoris: Secondary | ICD-10-CM | POA: Diagnosis not present

## 2020-03-21 DIAGNOSIS — E782 Mixed hyperlipidemia: Secondary | ICD-10-CM | POA: Diagnosis not present

## 2020-03-21 LAB — LIPID PANEL
Chol/HDL Ratio: 2.6 ratio (ref 0.0–5.0)
Cholesterol, Total: 151 mg/dL (ref 100–199)
HDL: 57 mg/dL (ref >39)
LDL Chol Calc (NIH): 73 mg/dL (ref 0–99)
Triglycerides: 115 mg/dL (ref 0–149)
VLDL Cholesterol Cal: 21 mg/dL (ref 5–40)

## 2020-03-21 LAB — COMPREHENSIVE METABOLIC PANEL
ALT: 10 IU/L (ref 0–44)
AST: 21 IU/L (ref 0–40)
Albumin/Globulin Ratio: 1.6 (ref 1.2–2.2)
Albumin: 4.6 g/dL (ref 3.7–4.7)
Alkaline Phosphatase: 97 IU/L (ref 39–117)
BUN/Creatinine Ratio: 17 (ref 10–24)
BUN: 23 mg/dL (ref 8–27)
Bilirubin Total: 0.6 mg/dL (ref 0.0–1.2)
CO2: 21 mmol/L (ref 20–29)
Calcium: 9.7 mg/dL (ref 8.6–10.2)
Chloride: 103 mmol/L (ref 96–106)
Creatinine, Ser: 1.33 mg/dL — ABNORMAL HIGH (ref 0.76–1.27)
GFR calc Af Amer: 61 mL/min/{1.73_m2} (ref >59)
GFR calc non Af Amer: 53 mL/min/{1.73_m2} — ABNORMAL LOW (ref >59)
Globulin, Total: 2.8 g/dL (ref 1.5–4.5)
Glucose: 109 mg/dL — ABNORMAL HIGH (ref 65–99)
Potassium: 4.5 mmol/L (ref 3.5–5.2)
Sodium: 141 mmol/L (ref 134–144)
Total Protein: 7.4 g/dL (ref 6.0–8.5)

## 2020-03-21 LAB — URIC ACID: Uric Acid: 8.2 mg/dL (ref 3.8–8.4)

## 2020-03-21 MED ORDER — APIXABAN 5 MG PO TABS: 5.0000 mg | ORAL_TABLET | Freq: Two times a day (BID) | ORAL | 6 refills | Status: DC

## 2020-03-21 NOTE — Patient Instructions (Addendum)
Medication Instructions:  Your physician has recommended you make the following change in your medication:  1. STOP Aspirin 2. START Eliquis 5 mg twice a day  *If you need a refill on your cardiac medications before your next appointment, please call your pharmacy*   Lab Work: Today: Lipid profile, CMET & uric acid If you have labs (blood work) drawn today and your tests are completely normal, you will receive your results only by: Marland Kitchen MyChart Message (if you have MyChart) OR . A paper copy in the mail If you have any lab test that is abnormal or we need to change your treatment, we will call you to review the results.   Testing/Procedures: Your physician has requested that you have an echocardiogram. Echocardiography is a painless test that uses sound waves to create images of your heart. It provides your doctor with information about the size and shape of your heart and how well your heart's chambers and valves are working. This procedure takes approximately one hour. There are no restrictions for this procedure.   Follow-Up: At [facility], you and your health needs are our priority.  As part of our continuing mission to provide you with exceptional heart care, we have created designated Provider Care Teams.  These Care Teams include your primary Cardiologist (physician) and Advanced Practice Providers (APPs -  Physician Assistants and Nurse Practitioners) who all work together to provide you with the care you need, when you need it.  We recommend signing up for the patient portal called "MyChart".  Sign up information is provided on this After Visit Summary.  MyChart is used to connect with patients for Virtual Visits (Telemedicine).  Patients are able to view lab/test results, encounter notes, upcoming appointments, etc.  Non-urgent messages can be sent to your provider as well.   To learn more about what you can do with MyChart, go to [email or web address].    Your next  appointment:   1 month(s)  The format for your next appointment:   In Person  Provider:   You may see  one of the following Advanced Practice Providers on your designated Care Team:  (along with a follow up EKG same day)  Richardson Dopp, PA-C  Robbie Lis, PA-C   Your physician wants you to follow-up in: 6 months with Dr. Acie Fredrickson. You will receive a reminder letter in the mail two months in advance. If you don't receive a letter, please call our office to schedule the follow-up appointment.  Thank you for choosing [facility]!!      Other Instructions

## 2020-03-21 NOTE — Progress Notes (Signed)
Cardiology Office Note   Date:  03/21/2020   ID:  Dean [name], DOB [birth year], MRN [id number]  PCP:  [name], PA-C  Cardiologist:   Mertie Moores, MD   Chief Complaint  Patient presents with  . Coronary Artery Disease   Problem list 1. CAD  - CABG Nov. 2018 2.   Post of atrial fib  3   Hyperlipidemia 4. Gout 5. GERD      Dean [name] is a 74 y.o. male who presents for further evaluation of some chest pain and shortness of breath. I have reviewed records from Dean [name] [name].  Has intermittant symptoms of DOE and CP Has occasional palpitations  Is hoarse at times  Has had a stress test - normal  Echo was normal .   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   Oct. 9, 2017:  Has started exercising some.   Is having gout issues which limits his exercise at time  Able to walk around without any significant CP or dyspnea.    Aug. 20, 2018  Dean [name] is seen back for follow-up visit for his premature ventricular contractions. Is taking Uloric for gouty tophi, Makes him nauseated, fatigued.   Has not had any exertional CP recently  Is on Indocin Saw his GP , saw something odd on ECG and he was sent here.  ECG here shows NSR , NS ST abn.  PVCs  Does not exercise,   No hx of CP or DOE Has rare episodes of CP when he is lying down .   March 08, 2018: Doing well from a cardiac standpont  Has had CABG , post op AF .   Nicely from a cardiac standpoint but then developed diverticulitis requiring surgery in mid March.  He was told by somebody during that hospitalization that he may have had a brief episode of atrial fibrillation.  There are no recorded episodes of atrial for ablation on the telemetry strips and no EKGs suggesting atrial fibrillation.   He related a  couple other complaints.  He has a bad taste in his mouth and has no appetite.  He also has had profound itching.  He thinks that it might be due to the warfarin therapy.  June 07, 2018:  Dean [name] is seen today for follow-up of his coronary artery disease and postoperative atrial fibrillation. Feeling great,   Walks 6 days a week at a rapid pace. No CP or dyspnea   Several concerns  - has some tenderness along his SVG harvest site on his left lower leg. Has some sternal tenderness also .   Jan. 10, 2020  Dean [name] is seen today for follow-up of his coronary artery disease and coronary artery bypass grafting.  He also has a history of hyperlipidemia and gout. No recent issues with gout.   Controlled with Uloric  Walks 1 hr a day ( walk / run)  No DOE on hills , no angina  Still has some chest wall tenderness. Has SVG harvest site numbness  Having back issues.    Wonders if its due to Atorvastatin .   March 21, 2020:  Dean [name] is seen today for follow-up of his coronary artery disease, coronary artery bypass grafting, atrial fibrillation, hyperlipidemia and  gout.    He has a history of chronic systolic congestive heart failure following his myocardial infarction in 2018. He has a history of postoperative atrial fibrillation and and is back in atrial fibrillation today.  He is back in atrial fibrillation today. He cannot tell that he is   We will be restarting Eliquis 5 mg twice a day Discontinue aspirin Repeat echocardiogram for further assessment of his CHF.  Consider adding low-dose ARB if his LV functions is still depressed. We will have him see an APP in 1 month to determine whether or not he needs cardioversion and I will see him again in 6 months.   Past Medical History:  Diagnosis Date  . Acid reflux    history of  . Anemia   . Atrial fibrillation ([place]) 10/2017   RVR  . Coronary artery disease   . Diverticulitis 11/26/2017  . Gout   . Hepatitis A age 75  . History of kidney  stones   . Hyperlipemia   . Hypogonadism male   . Irregular heart beat   . Ischemic cardiomyopathy   . Non compliance with medical treatment   . Non-STEMI (non-ST elevated myocardial infarction) ([place]) 10/2017  . Pelvic abscess in male [name])    percutaneous drain placement    Past Surgical History:  Procedure Laterality Date  . BLADDER REPAIR  02/17/2018   Procedure: BLADDER REPAIR;  Surgeon: Ileana Roup, MD;  Location: [facility];  Service: General;;  . BLADDER REPAIR  02/17/2018   Procedure: pelvic exploration, assist with bladder repair ;  Surgeon: Raynelle Bring, MD;  Location: [facility];  Service: Urology;;  . COLONOSCOPY  ~ 2014   Dr Ferdinand Lango in [facility].  Diverticulosis.  pt denies colon polyps.    . CORONARY ARTERY BYPASS GRAFT N/A 10/26/2017   Procedure: CORONARY ARTERY BYPASS GRAFTING (CABG) x four , using left internal mammary artery and bilateral legs greater saphenous vein harvested endoscopically;  Surgeon: Gaye Pollack, MD;  Location: [facility];  Service: Open Heart Surgery;  Laterality: N/A;  . CYSTOSCOPY W/ URETERAL STENT PLACEMENT Bilateral 02/17/2018   Procedure: CYSTOSCOPY WITH RETROGRADE PYELOGRAM/URETERAL STENT PLACEMENT;  Surgeon: Ardis Hughs, MD;  Location: [facility];  Service: Urology;  Laterality: Bilateral;  . IR CATHETER TUBE CHANGE  01/27/2018  . IR RADIOLOGIST EVAL & MGMT  12/21/2017  . LAPAROSCOPIC SIGMOID COLECTOMY N/A 02/17/2018   Procedure: LAPAROSCOPIC SIGMOID COLECTOMY  ERAS PATHWAY TAKEDOWN OF COLOVESSICAL COLOCUTANEOUS FISTULA;  Surgeon: Ileana Roup, MD;  Location: [facility];  Service: General;  Laterality: N/A;  . LEFT HEART CATH AND CORONARY ANGIOGRAPHY N/A 10/25/2017   Procedure: LEFT HEART CATH AND CORONARY ANGIOGRAPHY;  Surgeon: Leonie Man, MD;  Location: [facility];  Service: Cardiovascular;  Laterality: N/A;  . SIGMOIDOSCOPY N/A 02/17/2018   Procedure: FLEX SIGMOIDOSCOPY;  Surgeon: Ileana Roup, MD;  Location: [facility];  Service: General;  Laterality: N/A;  . TEE WITHOUT CARDIOVERSION N/A 10/26/2017   Procedure: TRANSESOPHAGEAL ECHOCARDIOGRAM (TEE);  Surgeon: Gaye Pollack, MD;  Location: [facility];  Service: Open Heart Surgery;  Laterality: N/A;     Current Outpatient Medications  Medication Sig Dispense Refill  . allopurinol (ZYLOPRIM) 100 MG tablet Take 50 mg by mouth daily.     Marland Kitchen aspirin EC 81 MG EC tablet Take 1 tablet (81 mg total) by mouth daily.    Marland Kitchen atorvastatin (LIPITOR) 10 MG tablet TAKE 1 TABLET(10 MG) BY MOUTH DAILY 90 tablet 2  .  carvedilol (COREG) 3.125 MG tablet TAKE 1 TABLET BY MOUTH TWICE DAILY WITH A MEAL 180 tablet 3  . Cholecalciferol (VITAMIN D3) 5000 units CAPS Take 5,000 Units by mouth as needed.     . ezetimibe (ZETIA) 10 MG tablet Take 0.5 tablets (5 mg total) by mouth daily. 45 tablet 3  . hydrocortisone cream 1 % Apply 1 application topically as needed for itching. Do NOT apply to sternal wound 30 g 0  . Magnesium 200 MG TABS Take 1 tablet by mouth every other day.     . Menaquinone-7 (VITAMIN K2) 100 MCG CAPS Take 1 capsule by mouth every other day.     No current facility-administered medications for this visit.    Allergies:   Crestor [rosuvastatin calcium]    Social History:  The patient  reports that he has quit smoking. He has never used smokeless tobacco. He reports current alcohol use. He reports that he does not use drugs.   Family History:  The patient's family history includes Alzheimer's disease in his maternal grandmother and mother; Hyperlipidemia in his brother and mother.    ROS:     Physical Exam: Blood pressure 114/82, pulse (!) 54, height 5' 9.5" (1.765 m), weight 177 lb 9.6 oz (80.6 kg), SpO2 99 %.  GEN:  Well nourished, well developed in no acute distress HEENT: Normal NECK: No JVD; No carotid bruits LYMPHATICS: No lymphadenopathy CARDIAC:  Irreg. Irreg.  RESPIRATORY:  Clear to auscultation without rales, wheezing or rhonchi  ABDOMEN: Soft,  non-tender, non-distended MUSCULOSKELETAL:  No edema; No deformity  SKIN: Warm and dry NEUROLOGIC:  Alert and oriented x 3   EKG:   March 21, 2020: Atrial fibrillation with a heart rate of 65.  Occasional premature ventricular contractions.   Recent Labs: 06/25/2019: BUN 19; Creatinine, Ser 1.23; Hemoglobin 14.2; Platelets 141; Potassium 5.3; Sodium 139 10/01/2019: ALT 12    Lipid Panel    Component Value Date/Time   CHOL 139 10/01/2019 0841   TRIG 136 10/01/2019 0841   HDL 44 10/01/2019 0841   CHOLHDL 3.2 10/01/2019 0841   CHOLHDL 3.3 10/25/2017 0341   VLDL 21 10/25/2017 0341   LDLCALC 71 10/01/2019 0841      Wt Readings from Last 3 Encounters:  03/21/20 177 lb 9.6 oz (80.6 kg)  06/18/19 175 lb (79.4 kg)  12/15/18 169 lb 1.9 oz (76.7 kg)      Other studies Reviewed: Additional studies/ records that were reviewed today include: . Review of the above records demonstrates:   ASSESSMENT AND PLAN:  1.  CAD - s/p CABG  -   he walks 4 miles a day.  He is not having any angina.   2. Atrial fib;   He is back in atrial fibrillation.  We will start Eliquis 5 mg twice a day.  We will discontinue aspirin. Will have him see an APP in 1 months with ECg to arrange cardioversion if needed.     3.  Chronic systolic congestive heart failure: His ejection fraction in 2018 was 30 to 35%.  It likely has improved following revascularization.  We will repeat his echocardiogram.  3.  Hyperlipidemia -last LDL is 71.  Continue current medications.   Current medicines are reviewed at length with the patient today.  The patient does not have concerns regarding medicines.  The following changes have been made:  no change  Labs/ tests ordered today include:   No orders of the defined types were placed in this encounter.  Disposition:   Follow-up with APP in 1 month  and  me in 6 months     Mertie Moores, MD  03/21/2020 9:34 AM    [address], [place], [place]  [address] Phone: [phone]; Fax: [phone]

## 2020-03-24 ENCOUNTER — Ambulatory Visit (HOSPITAL_BASED_OUTPATIENT_CLINIC_OR_DEPARTMENT_OTHER)
Admission: RE | Admit: 2020-03-24 | Discharge: 2020-03-24 | Disposition: A | Discharge status: Home / Self Care | Payer: Medicare Other | Source: Ambulatory Visit | Attending: Cardiovascular Disease | Admitting: Cardiovascular Disease

## 2020-03-24 ENCOUNTER — Other Ambulatory Visit: Payer: Self-pay

## 2020-03-24 DIAGNOSIS — I2510 Atherosclerotic heart disease of native coronary artery without angina pectoris: Secondary | ICD-10-CM

## 2020-03-24 DIAGNOSIS — E782 Mixed hyperlipidemia: Secondary | ICD-10-CM

## 2020-03-24 DIAGNOSIS — I480 Paroxysmal atrial fibrillation: Secondary | ICD-10-CM | POA: Diagnosis not present

## 2020-03-24 NOTE — Progress Notes (Signed)
  Echocardiogram 2D Echocardiogram has been performed.  [name] 03/24/2020, 11:36 AM

## 2020-04-22 NOTE — Progress Notes (Signed)
Cardiology Office Note:    Date:  04/23/2020   ID:  Dean [name], DOB [birth year], MRN [id number]  PCP:  [name], PA-C  Cardiologist:  Mertie Moores, MD   Electrophysiologist:  None   Referring MD: [name], PA-C   Chief Complaint:  Follow-up (Atrial fibrillation)    Patient Profile:    Dean [name] is a 74 y.o. 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  EF 30-35 in 10/2017  Echocardiogram in 03/2020: EF 55-60  Paroxysmal atrial fibrillation   CHA2DS2-VASc=3 (age x 1, CHF, CAD) >> Apixaban    Hyperlipidemia  GERD  Gout    Prior CV studies: Echocardiogram 03/24/2020 EF 55-60, no RWMA, mild LVH, normal diastolic function, normal RVSF, mod MR, trivial AI  Pre CABG Dopplers 10/25/17 Bilat ICA 1-39  Echocardiogram 10/25/17 EF 30-35, ant-lat, inf-lat and inf HK, Gr 2 DD, mild AI, mild to mod MR, mild to mod LAE   Cardiac catheterization 10/25/17 Severe multivessel CAD with 80% distal left main into circumflex, 80% mid LAD, subtotal 99% proximal circumflex and 100% occluded RCA.  The RCA fills via collaterals from the LAD.  All distal vessels appeared to have good targets for bypass.  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  History of Present Illness:    Dean [name] was last seen by Dr. Acie Fredrickson in 03/2020.  He was in atrial fibrillation without significant symptoms.  He was placed on Apixaban for anticoagulation and scheduled for 1 month follow up with an eye towards DCCV if he remains in atrial fibrillation.  He returns for follow up.  He is here alone.  He had an episode of palpitations few nights ago.  Otherwise, he has not had palpitations or symptoms related to atrial fibrillation.  He has not had significant fatigue.  Although, he has recently been tired as he has been renovating an apartment.  He has not had syncope, shortness of  breath or chest pain.  Past Medical History:  Diagnosis Date  . Acid reflux    history of  . Anemia   . Atrial fibrillation ([place]) 10/2017   RVR  . Coronary artery disease   . Diverticulitis 11/26/2017  . Gout   . Hepatitis A age 38  . History of kidney stones   . Hyperlipemia   . Hypogonadism male   . Irregular heart beat   . Ischemic cardiomyopathy   . Non compliance with medical treatment   . Non-STEMI (non-ST elevated myocardial infarction) ([place]) 10/2017  . Pelvic abscess in male [facility])    percutaneous drain placement    Current Medications: Current Meds  Medication Sig  . allopurinol (ZYLOPRIM) 100 MG tablet Take 100 mg by mouth daily.   Marland Kitchen apixaban (ELIQUIS) 5 MG TABS tablet Take 1 tablet (5 mg total) by mouth 2 (two) times daily.  Marland Kitchen atorvastatin (LIPITOR) 10 MG tablet TAKE 1 TABLET(10 MG) BY MOUTH DAILY  . carvedilol (COREG) 3.125 MG tablet TAKE 1 TABLET BY MOUTH TWICE DAILY WITH A MEAL  . Cholecalciferol (VITAMIN D3) 5000 units CAPS Take 5,000 Units by mouth as needed.   . ezetimibe (ZETIA) 10 MG tablet Take 0.5 tablets (5 mg total) by mouth daily.  . hydrocortisone cream 1 % Apply 1 application topically as needed for itching. Do NOT apply to sternal wound  . Magnesium 200 MG TABS Take  1 tablet by mouth every other day.   . Menaquinone-7 (VITAMIN K2) 100 MCG CAPS Take 1 capsule by mouth every other day.  . SF 5000 PLUS 1.1 % CREA dental cream Place 1 application onto teeth 2 (two) times daily at 8 am and 10 pm.      Allergies:   Crestor [rosuvastatin calcium]   Social History   Tobacco Use  . Smoking status: Former Research scientist (life sciences)  . Smokeless tobacco: Never Used  Substance Use Topics  . Alcohol use: Yes    Alcohol/week: 0.0 standard drinks    Comment: once monthly  . Drug use: No     Family Hx: The patient's family history includes Alzheimer's disease in his maternal grandmother and mother; Hyperlipidemia in his brother and mother. There is no history of Colon  cancer, Esophageal cancer, or Stomach cancer.  ROS   EKGs/Labs/Other Test Reviewed:    EKG:  EKG is   ordered today.  The ekg ordered today demonstrates sinus bradycardia, HR 52, IVCD, QTC 405, nonspecific ST-T wave changes, similar to prior tracings  Recent Labs: 06/25/2019: Hemoglobin 14.2; Platelets 141 03/21/2020: ALT 10; BUN 23; Creatinine, Ser 1.33; Potassium 4.5; Sodium 141   Recent Lipid Panel Lab Results  Component Value Date/Time   CHOL 151 03/21/2020 10:07 AM   TRIG 115 03/21/2020 10:07 AM   HDL 57 03/21/2020 10:07 AM   CHOLHDL 2.6 03/21/2020 10:07 AM   CHOLHDL 3.3 10/25/2017 03:41 AM   LDLCALC 73 03/21/2020 10:07 AM    Physical Exam:    VS:  BP (!) 124/50   Pulse (!) 52   Ht 5' 9.25" (1.759 m)   Wt 174 lb (78.9 kg)   SpO2 96%   BMI 25.51 kg/m     Wt Readings from Last 3 Encounters:  04/23/20 174 lb (78.9 kg)  03/21/20 177 lb 9.6 oz (80.6 kg)  06/18/19 175 lb (79.4 kg)     Constitutional:      Appearance: Healthy appearance. Not in distress.  Neck:     Thyroid: No thyromegaly.     Vascular: JVD normal.  Pulmonary:     Effort: Pulmonary effort is normal.     Breath sounds: No wheezing. No rales.  Cardiovascular:     Bradycardia present. Regular rhythm. Normal S1. Normal S2.     Murmurs: There is no murmur.  Edema:    Ankle: bilateral trace edema of the ankle. Abdominal:     Palpations: Abdomen is soft. There is no hepatomegaly.  Skin:    General: Skin is warm and dry.  Neurological:     General: No focal deficit present.     Mental Status: Alert and oriented to person, place and time.     Cranial Nerves: Cranial nerves are intact.       ASSESSMENT & PLAN:    1. PAF (paroxysmal atrial fibrillation) (HCC) ECG today demonstrates sinus bradycardia.  Essentially, he has no symptoms related to atrial fibrillation.  He did have some palpitations recently.  I have asked him to notify us if these continue.  We could certainly set him up for an event  monitor to assess for uncontrolled heart rate.  We discussed the importance of continuing on anticoagulation for stroke prevention.  Continue Apixaban 5 mg twice daily.  Obtain follow-up BMET, CBC today.  Follow-up with Dr. Acie Fredrickson in 6 months.  2. Coronary artery disease involving native coronary artery of native heart without angina pectoris History of myocardial infarction followed by CABG in  2018.  He is currently doing well without anginal symptoms.  He is not on aspirin as he is on Apixaban.  Continue carvedilol, atorvastatin.  3. Ischemic cardiomyopathy EF post MI was 30-35%.  Most recent echocardiogram with improved LV function with EF 55-60%.  Continue carvedilol.    Dispo:  Return in about 6 months (around 10/24/2020) for Routine Follow Up, w/ Dr. Acie Fredrickson, in person.   Medication Adjustments/Labs and Tests Ordered: Current medicines are reviewed at length with the patient today.  Concerns regarding medicines are outlined above.  Tests Ordered: Orders Placed This Encounter  Procedures  . Basic metabolic panel  . CBC  . EKG 12-Lead   Medication Changes: No orders of the defined types were placed in this encounter.   Signed, Richardson Dopp, PA-C  04/23/2020 10:43 AM    [facility] [place], [place], [place]  [address] Phone: [phone]; Fax: [phone]

## 2020-04-23 ENCOUNTER — Encounter: Payer: Self-pay | Admitting: Physician Assistant

## 2020-04-23 ENCOUNTER — Ambulatory Visit: Payer: Medicare Other | Admitting: Physician Assistant

## 2020-04-23 ENCOUNTER — Other Ambulatory Visit: Payer: Self-pay

## 2020-04-23 VITALS — BP 124/50 | HR 52 | Ht 69.25 in | Wt 174.0 lb

## 2020-04-23 DIAGNOSIS — I480 Paroxysmal atrial fibrillation: Secondary | ICD-10-CM

## 2020-04-23 DIAGNOSIS — I255 Ischemic cardiomyopathy: Secondary | ICD-10-CM

## 2020-04-23 DIAGNOSIS — I2510 Atherosclerotic heart disease of native coronary artery without angina pectoris: Secondary | ICD-10-CM | POA: Diagnosis not present

## 2020-04-23 LAB — BASIC METABOLIC PANEL
BUN/Creatinine Ratio: 20 (ref 10–24)
BUN: 25 mg/dL (ref 8–27)
CO2: 23 mmol/L (ref 20–29)
Calcium: 9.3 mg/dL (ref 8.6–10.2)
Chloride: 103 mmol/L (ref 96–106)
Creatinine, Ser: 1.26 mg/dL (ref 0.76–1.27)
GFR calc Af Amer: 65 mL/min/{1.73_m2} (ref >59)
GFR calc non Af Amer: 57 mL/min/{1.73_m2} — ABNORMAL LOW (ref >59)
Glucose: 103 mg/dL — ABNORMAL HIGH (ref 65–99)
Potassium: 4.4 mmol/L (ref 3.5–5.2)
Sodium: 139 mmol/L (ref 134–144)

## 2020-04-23 LAB — CBC
Hematocrit: 39.7 % (ref 37.5–51.0)
Hemoglobin: 13.2 g/dL (ref 13.0–17.7)
MCH: 32.5 pg (ref 26.6–33.0)
MCHC: 33.2 g/dL (ref 31.5–35.7)
MCV: 98 fL — ABNORMAL HIGH (ref 79–97)
Platelets: 163 10*3/uL (ref 150–450)
RBC: 4.06 x10E6/uL — ABNORMAL LOW (ref 4.14–5.80)
RDW: 13.2 % (ref 11.6–15.4)
WBC: 4.3 10*3/uL (ref 3.4–10.8)

## 2020-04-23 NOTE — Patient Instructions (Signed)
Medication Instructions:   Your physician recommends that you continue on your current medications as directed. Please refer to the Current Medication list given to you today.  *If you need a refill on your cardiac medications before your next appointment, please call your pharmacy*  Lab Work:  You will have labs drawn today: BMET/CBC  If you have labs (blood work) drawn today and your tests are completely normal, you will receive your results only by: Marland Kitchen MyChart Message (if you have MyChart) OR . A paper copy in the mail If you have any lab test that is abnormal or we need to change your treatment, we will call you to review the results.  Testing/Procedures:  None ordered today  Follow-Up: At [facility], you and your health needs are our priority.  As part of our continuing mission to provide you with exceptional heart care, we have created designated Provider Care Teams.  These Care Teams include your primary Cardiologist (physician) and Advanced Practice Providers (APPs -  Physician Assistants and Nurse Practitioners) who all work together to provide you with the care you need, when you need it.  We recommend signing up for the patient portal called "MyChart".  Sign up information is provided on this After Visit Summary.  MyChart is used to connect with patients for Virtual Visits (Telemedicine).  Patients are able to view lab/test results, encounter notes, upcoming appointments, etc.  Non-urgent messages can be sent to your provider as well.   To learn more about what you can do with MyChart, go to [email or web address].    Your next appointment:   6 month(s)  The format for your next appointment:   In Person  Provider:   Mertie Moores, MD

## 2020-05-13 ENCOUNTER — Other Ambulatory Visit: Payer: Self-pay

## 2020-05-13 MED ORDER — APIXABAN 5 MG PO TABS: 5.0000 mg | ORAL_TABLET | Freq: Two times a day (BID) | ORAL | 1 refills | Status: DC

## 2020-05-13 MED ORDER — EZETIMIBE 10 MG PO TABS: 5.0000 mg | ORAL_TABLET | Freq: Every day | ORAL | 3 refills | Status: DC

## 2020-05-13 MED ORDER — ATORVASTATIN CALCIUM 10 MG PO TABS: ORAL_TABLET | ORAL | 3 refills | Status: DC

## 2020-05-13 NOTE — Telephone Encounter (Signed)
Faxed refill request received from [facility].  Pt last saw Richardson Dopp, PA on 04/23/20, last labs 04/23/20 Creat 1.26, age 74, weight 78.9kg, based on specified criteria pt is on appropriate dosage of Eliquis 5mg  BID.  Will refill rx.

## 2020-06-26 ENCOUNTER — Other Ambulatory Visit: Payer: Self-pay

## 2020-06-26 MED ORDER — CARVEDILOL 3.125 MG PO TABS: ORAL_TABLET | ORAL | 2 refills | Status: DC

## 2020-10-20 ENCOUNTER — Encounter: Payer: Self-pay | Admitting: Cardiovascular Disease

## 2020-10-20 NOTE — Progress Notes (Signed)
Cardiology Office Note   Date:  10/21/2020   ID:  Dean [name], DOB Jan 07, 1946, MRN [id number]  PCP:  [name], PA-C  Cardiologist:   Mertie Moores, MD   Chief Complaint  Patient presents with  . Coronary Artery Disease   Problem list 1. CAD  - CABG Nov. 2018 2.   Post of atrial fib  3   Hyperlipidemia 4. Gout 5. GERD  6.  CVA - prior to diagnosis of atrial fib      Dean [name] is a 74 y.o. male who presents for further evaluation of some chest pain and shortness of breath. I have reviewed records from [facility] center.  Has intermittant symptoms of DOE and CP Has occasional palpitations  Is hoarse at times  Has had a stress test - normal  Echo was normal .   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   Oct. 9, 2017:  Has started exercising some.   Is having gout issues which limits his exercise at time  Able to walk around without any significant CP or dyspnea.    Aug. 20, 2018  Mr. Fallen is seen back for follow-up visit for his premature ventricular contractions. Is taking Uloric for gouty tophi, Makes him nauseated, fatigued.   Has not had any exertional CP recently  Is on Indocin Saw his GP , saw something odd on ECG and he was sent here.  ECG here shows NSR , NS ST abn.  PVCs  Does not exercise,   No hx of CP or DOE Has rare episodes of CP when he is lying down .   March 08, 2018: Doing well from a cardiac standpont  Has had CABG , post op AF .   Nicely from a cardiac standpoint but then developed diverticulitis requiring surgery in mid March.  He was told by somebody during that hospitalization that he may have had a brief episode of atrial fibrillation.  There are no recorded episodes of atrial for ablation on the telemetry strips and no EKGs  suggesting atrial fibrillation.   He related a couple other complaints.  He has a bad taste in his mouth and has no appetite.  He also has had profound itching.  He thinks that it might be due to the warfarin therapy.  June 07, 2018:  [name] is seen today for follow-up of his coronary artery disease and postoperative atrial fibrillation. Feeling great,   Walks 6 days a week at a rapid pace. No CP or dyspnea   Several concerns  - has some tenderness along his SVG harvest site on his left lower leg. Has some sternal tenderness also .   Jan. 10, 2020  [name] is seen today for follow-up of his coronary artery disease and coronary artery bypass grafting.  He also has a history of hyperlipidemia and gout. No recent issues with gout.   Controlled with Uloric  Walks 1 hr a day ( walk / run)  No DOE on hills , no angina  Still has some chest wall tenderness. Has SVG harvest site numbness  Having back issues.    Wonders if its due to Atorvastatin .   March 21, 2020:  [name] is seen today for follow-up of his  coronary artery disease, coronary artery bypass grafting, atrial fibrillation, hyperlipidemia and gout.    He has a history of chronic systolic congestive heart failure following his myocardial infarction in 2018. He has a history of postoperative atrial fibrillation and and is back in atrial fibrillation today.  He is back in atrial fibrillation today. He cannot tell that he is   We will be restarting Eliquis 5 mg twice a day Discontinue aspirin Repeat echocardiogram for further assessment of his CHF.  Consider adding low-dose ARB if his LV functions is still depressed. We will have him see an APP in 1 month to determine whether or not he needs cardioversion and I will see him again in 6 months.  Nov. 16, 2021: Dean [name] is seen today for follow up of his CAD, CHF, PAF  His original EF was 35%. Follow up echo in April, 2021 showed an EF of 55-60%. Moderate MR  On Eliquis  He has  occasional episodes of tachycardia. Fit bit shows HR of 160 We discussed Kardia monitor Propranolol 10 mg tabs QID PRN     Past Medical History:  Diagnosis Date  . Acid reflux    history of  . Anemia   . Atrial fibrillation ([place]) 10/2017   RVR  . Coronary artery disease   . Diverticulitis 11/26/2017  . Gout   . Hepatitis A age 41  . History of kidney stones   . Hyperlipemia   . Hypogonadism male   . Irregular heart beat   . Ischemic cardiomyopathy   . Non compliance with medical treatment   . Non-STEMI (non-ST elevated myocardial infarction) ([place]) 10/2017  . Pelvic abscess in male [facility])    percutaneous drain placement    Past Surgical History:  Procedure Laterality Date  . BLADDER REPAIR  02/17/2018   Procedure: BLADDER REPAIR;  Surgeon: Ileana Roup, MD;  Location: [facility];  Service: General;;  . BLADDER REPAIR  02/17/2018   Procedure: pelvic exploration, assist with bladder repair ;  Surgeon: Raynelle Bring, MD;  Location: [facility];  Service: Urology;;  . COLONOSCOPY  ~ 2014   Dr Ferdinand Lango in [facility].  Diverticulosis.  pt denies colon polyps.    . CORONARY ARTERY BYPASS GRAFT N/A 10/26/2017   Procedure: CORONARY ARTERY BYPASS GRAFTING (CABG) x four , using left internal mammary artery and bilateral legs greater saphenous vein harvested endoscopically;  Surgeon: Gaye Pollack, MD;  Location: [place] OR;  Service: Open Heart Surgery;  Laterality: N/A;  . CYSTOSCOPY W/ URETERAL STENT PLACEMENT Bilateral 02/17/2018   Procedure: CYSTOSCOPY WITH RETROGRADE PYELOGRAM/URETERAL STENT PLACEMENT;  Surgeon: Ardis Hughs, MD;  Location: [facility];  Service: Urology;  Laterality: Bilateral;  . IR CATHETER TUBE CHANGE  01/27/2018  . IR RADIOLOGIST EVAL & MGMT  12/21/2017  . LAPAROSCOPIC SIGMOID COLECTOMY N/A 02/17/2018   Procedure: LAPAROSCOPIC SIGMOID COLECTOMY  ERAS PATHWAY TAKEDOWN OF COLOVESSICAL COLOCUTANEOUS FISTULA;  Surgeon: Ileana Roup, MD;  Location: [facility];  Service:  General;  Laterality: N/A;  . LEFT HEART CATH AND CORONARY ANGIOGRAPHY N/A 10/25/2017   Procedure: LEFT HEART CATH AND CORONARY ANGIOGRAPHY;  Surgeon: Leonie Man, MD;  Location: [place] [facility] LAB;  Service: Cardiovascular;  Laterality: N/A;  . SIGMOIDOSCOPY N/A 02/17/2018   Procedure: FLEX SIGMOIDOSCOPY;  Surgeon: Ileana Roup, MD;  Location: [facility];  Service: General;  Laterality: N/A;  . TEE WITHOUT CARDIOVERSION N/A 10/26/2017   Procedure: TRANSESOPHAGEAL ECHOCARDIOGRAM (TEE);  Surgeon: Gaye Pollack, MD;  Location:  [place] OR;  Service: Open Heart Surgery;  Laterality: N/A;     Current Outpatient Medications  Medication Sig Dispense Refill  . allopurinol (ZYLOPRIM) 100 MG tablet Take 100 mg by mouth daily.     Marland Kitchen apixaban (ELIQUIS) 5 MG TABS tablet Take 1 tablet (5 mg total) by mouth 2 (two) times daily. 180 tablet 1  . atorvastatin (LIPITOR) 10 MG tablet TAKE 1 TABLET(10 MG) BY MOUTH DAILY 90 tablet 3  . carvedilol (COREG) 3.125 MG tablet TAKE 1 TABLET BY MOUTH TWICE DAILY WITH A MEAL 180 tablet 2  . ezetimibe (ZETIA) 10 MG tablet Take 0.5 tablets (5 mg total) by mouth daily. 45 tablet 3  . hydrocortisone cream 1 % Apply 1 application topically as needed for itching. Do NOT apply to sternal wound 30 g 0  . propranolol (INDERAL) 10 MG tablet Take 1 tablet (10 mg total) by mouth 4 (four) times daily. As needed 30 tablet 0   No current facility-administered medications for this visit.    Allergies:   Crestor [rosuvastatin calcium]    Social History:  The patient  reports that he has quit smoking. He has never used smokeless tobacco. He reports current alcohol use. He reports that he does not use drugs.   Family History:  The patient's family history includes Alzheimer's disease in his maternal grandmother and mother; Hyperlipidemia in his brother and mother.    ROS:     Physical Exam: Blood pressure 106/80, pulse 60, height 5' 9.5" (1.765 m), weight 177 lb (80.3 kg),  SpO2 97 %.  GEN:  Well nourished, well developed in no acute distress HEENT: Normal NECK: No JVD; No carotid bruits LYMPHATICS: No lymphadenopathy CARDIAC: irreg. irreg  RESPIRATORY:  Clear to auscultation without rales, wheezing or rhonchi  ABDOMEN: Soft, non-tender, non-distended MUSCULOSKELETAL:  No edema; No deformity  SKIN: Warm and dry NEUROLOGIC:  Alert and oriented x 3    EKG:   October 21, 2020: Atrial fibrillation with heart rate of 62.  No ST or T wave changes.    Recent Labs: 03/21/2020: ALT 10 04/23/2020: BUN 25; Creatinine, Ser 1.26; Hemoglobin 13.2; Platelets 163; Potassium 4.4; Sodium 139    Lipid Panel    Component Value Date/Time   CHOL 151 03/21/2020 1007   TRIG 115 03/21/2020 1007   HDL 57 03/21/2020 1007   CHOLHDL 2.6 03/21/2020 1007   CHOLHDL 3.3 10/25/2017 0341   VLDL 21 10/25/2017 0341   LDLCALC 73 03/21/2020 1007      Wt Readings from Last 3 Encounters:  10/21/20 177 lb (80.3 kg)  04/23/20 174 lb (78.9 kg)  03/21/20 177 lb 9.6 oz (80.6 kg)      Other studies Reviewed: Additional studies/ records that were reviewed today include: . Review of the above records demonstrates:   ASSESSMENT AND PLAN:  1.  CAD - s/p CABG  -   No angina   2. Atrial fib;   Continues to have atrial fib :  Is basically asymptomatic.   Occasionally has rapid HR .  Will give Propralolol 10 mg Q6 hr PRN to see if it helps.   Alternatively, we could try Dilt 30 mg  PRN .  Kardia    3.  Chronic systolic congestive heart failure: stable   3.  Hyperlipidemia -  Check lipids, liver , bmp today   4.  History of gout: We will check uric acid levels today.   Current medicines are reviewed at length with the patient today.  The patient does not have concerns regarding medicines.  The following changes have been made:  no change  Labs/ tests ordered today include:   Orders Placed This Encounter  Procedures  . Lipid Profile  . Hepatic function panel  . Basic  Metabolic Panel (BMET)  . Uric acid  . EKG 12-Lead     Disposition:   Follow-up with APP in 1 month  and  me in 6 months     Mertie Moores, MD  10/21/2020 1:22 PM    [facility] [place], [place], [place]  [address] Phone: [phone]; Fax: [phone]

## 2020-10-21 ENCOUNTER — Other Ambulatory Visit: Payer: Self-pay

## 2020-10-21 ENCOUNTER — Ambulatory Visit: Payer: Medicare Other | Admitting: Cardiovascular Disease

## 2020-10-21 ENCOUNTER — Encounter: Payer: Self-pay | Admitting: Cardiovascular Disease

## 2020-10-21 VITALS — BP 106/80 | HR 60 | Ht 69.5 in | Wt 177.0 lb

## 2020-10-21 DIAGNOSIS — I480 Paroxysmal atrial fibrillation: Secondary | ICD-10-CM

## 2020-10-21 DIAGNOSIS — M109 Gout, unspecified: Secondary | ICD-10-CM | POA: Diagnosis not present

## 2020-10-21 DIAGNOSIS — I255 Ischemic cardiomyopathy: Secondary | ICD-10-CM

## 2020-10-21 DIAGNOSIS — I2510 Atherosclerotic heart disease of native coronary artery without angina pectoris: Secondary | ICD-10-CM | POA: Insufficient documentation

## 2020-10-21 LAB — BASIC METABOLIC PANEL
BUN/Creatinine Ratio: 16 (ref 10–24)
BUN: 18 mg/dL (ref 8–27)
CO2: 24 mmol/L (ref 20–29)
Calcium: 9.2 mg/dL (ref 8.6–10.2)
Chloride: 105 mmol/L (ref 96–106)
Creatinine, Ser: 1.15 mg/dL (ref 0.76–1.27)
GFR calc Af Amer: 73 mL/min/{1.73_m2} (ref >59)
GFR calc non Af Amer: 63 mL/min/{1.73_m2} (ref >59)
Glucose: 98 mg/dL (ref 65–99)
Potassium: 4.5 mmol/L (ref 3.5–5.2)
Sodium: 140 mmol/L (ref 134–144)

## 2020-10-21 LAB — LIPID PANEL
Chol/HDL Ratio: 3.5 ratio (ref 0.0–5.0)
Cholesterol, Total: 170 mg/dL (ref 100–199)
HDL: 48 mg/dL (ref >39)
LDL Chol Calc (NIH): 92 mg/dL (ref 0–99)
Triglycerides: 174 mg/dL — ABNORMAL HIGH (ref 0–149)
VLDL Cholesterol Cal: 30 mg/dL (ref 5–40)

## 2020-10-21 LAB — HEPATIC FUNCTION PANEL
ALT: 11 IU/L (ref 0–44)
AST: 13 IU/L (ref 0–40)
Albumin: 4.2 g/dL (ref 3.7–4.7)
Alkaline Phosphatase: 89 IU/L (ref 44–121)
Bilirubin Total: 0.6 mg/dL (ref 0.0–1.2)
Bilirubin, Direct: 0.16 mg/dL (ref 0.00–0.40)
Total Protein: 6.8 g/dL (ref 6.0–8.5)

## 2020-10-21 LAB — URIC ACID: Uric Acid: 6.5 mg/dL (ref 3.8–8.4)

## 2020-10-21 MED ORDER — PROPRANOLOL HCL 10 MG PO TABS
10.0000 mg | ORAL_TABLET | Freq: Four times a day (QID) | ORAL | 0 refills | Status: DC
Start: 2020-10-21 — End: 2021-06-16

## 2020-10-21 MED ORDER — PROPRANOLOL HCL 10 MG PO TABS: 10.0000 mg | ORAL_TABLET | Freq: Four times a day (QID) | ORAL | 0 refills | Status: DC

## 2020-10-21 NOTE — Patient Instructions (Addendum)
Kardia [employer] app for free  Costs $79.  Medication Instructions:  Propranolol 10 mg 4 times a day as needed   *If you need a refill on your cardiac medications before your next appointment, please call your pharmacy*   Lab Work: Bmet, uric acid, lipid, liver,  If you have labs (blood work) drawn today and your tests are completely normal, you will receive your results only by: Marland Kitchen MyChart Message (if you have MyChart) OR . A paper copy in the mail If you have any lab test that is abnormal or we need to change your treatment, we will call you to review the results.   Testing/Procedures: none   Follow-Up: At [facility], you and your health needs are our priority.  As part of our continuing mission to provide you with exceptional heart care, we have created designated Provider Care Teams.  These Care Teams include your primary Cardiologist (physician) and Advanced Practice Providers (APPs -  Physician Assistants and Nurse Practitioners) who all work together to provide you with the care you need, when you need it.  We recommend signing up for the patient portal called "MyChart".  Sign up information is provided on this After Visit Summary.  MyChart is used to connect with patients for Virtual Visits (Telemedicine).  Patients are able to view lab/test results, encounter notes, upcoming appointments, etc.  Non-urgent messages can be sent to your provider as well.   To learn more about what you can do with MyChart, go to [email or web address].    Your next appointment:   6 month(s)  The format for your next appointment:   In Person  Provider:   You will see one of the following Advanced Practice Providers on your designated Care Team:    Richardson Dopp, PA-C  Robbie Lis, [place]     Other Instructions

## 2020-11-15 ENCOUNTER — Other Ambulatory Visit: Payer: Self-pay | Admitting: Cardiovascular Disease

## 2020-11-17 ENCOUNTER — Other Ambulatory Visit: Payer: Self-pay

## 2020-11-17 MED ORDER — APIXABAN 5 MG PO TABS
5.0000 mg | ORAL_TABLET | Freq: Two times a day (BID) | ORAL | 1 refills | Status: DC
Start: 2020-11-17 — End: 2021-06-15

## 2020-11-17 NOTE — Telephone Encounter (Signed)
Eliquis 5mg  refill request received. Patient is 74 years old, weight-80.3kg, Crea-1.15 on 10/21/2020, Diagnosis-Afib, and last seen by Dr. Acie Fredrickson on 10/21/2020. Dose is appropriate based on dosing criteria. Will send in refill to requested pharmacy.

## 2021-01-15 ENCOUNTER — Other Ambulatory Visit: Payer: Self-pay | Admitting: Cardiovascular Disease

## 2021-01-17 ENCOUNTER — Other Ambulatory Visit: Payer: Self-pay | Admitting: Cardiovascular Disease

## 2021-03-03 ENCOUNTER — Encounter: Payer: Self-pay | Admitting: Medical

## 2021-03-03 ENCOUNTER — Other Ambulatory Visit: Payer: Self-pay

## 2021-03-03 ENCOUNTER — Other Ambulatory Visit: Payer: Self-pay | Admitting: Cardiovascular Disease

## 2021-03-03 ENCOUNTER — Ambulatory Visit (INDEPENDENT_AMBULATORY_CARE_PROVIDER_SITE_OTHER): Payer: Medicare Other | Admitting: Medical

## 2021-03-03 VITALS — BP 128/70 | HR 55 | Temp 98.2°F | Resp 18 | Ht 69.0 in | Wt 185.0 lb

## 2021-03-03 DIAGNOSIS — Z23 Encounter for immunization: Secondary | ICD-10-CM

## 2021-03-03 DIAGNOSIS — E782 Mixed hyperlipidemia: Secondary | ICD-10-CM | POA: Diagnosis not present

## 2021-03-03 DIAGNOSIS — K635 Polyp of colon: Secondary | ICD-10-CM | POA: Diagnosis not present

## 2021-03-03 DIAGNOSIS — K5792 Diverticulitis of intestine, part unspecified, without perforation or abscess without bleeding: Secondary | ICD-10-CM

## 2021-03-03 DIAGNOSIS — I2510 Atherosclerotic heart disease of native coronary artery without angina pectoris: Secondary | ICD-10-CM

## 2021-03-03 DIAGNOSIS — R823 Hemoglobinuria: Secondary | ICD-10-CM | POA: Diagnosis not present

## 2021-03-03 DIAGNOSIS — Z125 Encounter for screening for malignant neoplasm of prostate: Secondary | ICD-10-CM

## 2021-03-03 DIAGNOSIS — R350 Frequency of micturition: Secondary | ICD-10-CM

## 2021-03-03 LAB — POC URINALSYSI DIPSTICK (AUTOMATED)
Bilirubin, UA: NEGATIVE
Blood, UA: NEGATIVE
Glucose, UA: NEGATIVE
Ketones, UA: NEGATIVE
Leukocytes, UA: NEGATIVE
Nitrite, UA: NEGATIVE
Protein, UA: POSITIVE — AB
Spec Grav, UA: >=1.03 — AB (ref 1.010–1.025)
Urobilinogen, UA: 0.2 E.U./dL
pH, UA: 5 (ref 5.0–8.0)

## 2021-03-03 NOTE — Progress Notes (Signed)
Subjective:    Patient ID: Dean [name], male    DOB: November 18, 1946, 75 y.o.   MRN: [id number]  HPI  Pt in for first time.  Moved to area 8 years ago from [place]. Pt walks 1 hour a day.    Hx of MI in 2019. Also history of CABG. Hx of atrial fibrillation. Pt is on eliquis. He is also on propanol 10 mg  Mg qid if were to get tachycardia. Majority of the time pt is rate controlled.  Hx of gout. Pt went to specialist for gout and he is on allopurinol. Pt also has colchicine to use as back up if needed.  Hx of hyperlipidemia. Pt is on zetia.    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  Pt mentions that he had class action law suit due to side effect of uloric.   Pt never had shingles. Never had vaccine.  Pt never had pneumonia vaccine. He wants to get done today. Advisement given today.  On review no psa in the past. Some occasional urge to urinate/frequency of urination.    Review of Systems  Constitutional: Negative for chills, fatigue and fever.  HENT: Negative for congestion, drooling and hearing loss.   Respiratory: Negative for cough, chest tightness, shortness of breath and wheezing.   Cardiovascular: Negative for chest pain and palpitations.  Gastrointestinal: Negative for abdominal pain, constipation, diarrhea and vomiting.  Genitourinary: Negative for dysuria, flank pain, frequency, penile swelling, testicular pain and urgency.  Musculoskeletal: Negative for back pain, joint swelling and myalgias.  Skin: Negative for rash.  Neurological: Negative for dizziness, syncope, speech difficulty, weakness, numbness and headaches.  Hematological: Negative for adenopathy. Does not bruise/bleed easily.  Psychiatric/Behavioral: Negative for behavioral problems, decreased concentration and dysphoric mood. The patient is not nervous/anxious and is not hyperactive.     Past Medical History:  Diagnosis Date  . Acid reflux    history of   . Anemia   . Atrial fibrillation ([place]) 10/2017   RVR  . Coronary artery disease   . Diverticulitis 11/26/2017  . Gout   . Hepatitis A age 83  . History of kidney stones   . Hyperlipemia   . Hypogonadism male   . Irregular heart beat   . Ischemic cardiomyopathy   . Non compliance with medical treatment   . Non-STEMI (non-ST elevated myocardial infarction) ([place]) 10/2017  . Pelvic abscess in male [facility])    percutaneous drain placement     Social History   Socioeconomic History  . Marital status: Single    Spouse name: Not on file  . Number of children: Not on file  . Years of education: Not on file  . Highest education level: Not on file  Occupational History  . Occupation: retired  Tobacco Use  . Smoking status: Former Research scientist (life sciences)  . Smokeless tobacco: Never Used  Vaping Use  . Vaping Use: Never used  Substance and Sexual Activity  . Alcohol use: Yes    Alcohol/week: 0.0 standard drinks    Comment: once monthly  . Drug use: No  . Sexual activity: Not on file  Other Topics Concern  . Not on file  Social History Narrative  . Not on file   Social Determinants of Health   Financial Resource Strain: Not on file  Food Insecurity: Not on file  Transportation Needs: Not on file  Physical Activity: Not on file  Stress: Not on file  Social Connections:  Not on file  Intimate Partner Violence: Not on file    Past Surgical History:  Procedure Laterality Date  . BLADDER REPAIR  02/17/2018   Procedure: BLADDER REPAIR;  Surgeon: Ileana Roup, MD;  [facility];  Service: General;;  . BLADDER REPAIR  02/17/2018   Procedure: pelvic exploration, assist with bladder repair ;  Surgeon: Raynelle Bring, MD;  [facility];  Service: Urology;;  . COLONOSCOPY  ~ 2014   Dr Ferdinand Lango in [facility].  Diverticulosis.  pt denies colon polyps.    . CORONARY ARTERY BYPASS GRAFT N/A 10/26/2017   Procedure: CORONARY ARTERY BYPASS GRAFTING (CABG) x four , using left internal mammary  artery and bilateral legs greater saphenous vein harvested endoscopically;  Surgeon: Gaye Pollack, MD;  Location: [place] OR;  Service: Open Heart Surgery;  Laterality: N/A;  . CYSTOSCOPY W/ URETERAL STENT PLACEMENT Bilateral 02/17/2018   Procedure: CYSTOSCOPY WITH RETROGRADE PYELOGRAM/URETERAL STENT PLACEMENT;  Surgeon: Ardis Hughs, MD;  [facility];  Service: Urology;  Laterality: Bilateral;  . IR CATHETER TUBE CHANGE  01/27/2018  . IR RADIOLOGIST EVAL & MGMT  12/21/2017  . LAPAROSCOPIC SIGMOID COLECTOMY N/A 02/17/2018   Procedure: LAPAROSCOPIC SIGMOID COLECTOMY  ERAS PATHWAY TAKEDOWN OF COLOVESSICAL COLOCUTANEOUS FISTULA;  Surgeon: Ileana Roup, MD;  [facility];  Service: General;  Laterality: N/A;  . LEFT HEART CATH AND CORONARY ANGIOGRAPHY N/A 10/25/2017   Procedure: LEFT HEART CATH AND CORONARY ANGIOGRAPHY;  Surgeon: Leonie Man, MD;  Location: [place] [facility];  Service: Cardiovascular;  Laterality: N/A;  . SIGMOIDOSCOPY N/A 02/17/2018   Procedure: FLEX SIGMOIDOSCOPY;  Surgeon: Ileana Roup, MD;  [facility];  Service: General;  Laterality: N/A;  . TEE WITHOUT CARDIOVERSION N/A 10/26/2017   Procedure: TRANSESOPHAGEAL ECHOCARDIOGRAM (TEE);  Surgeon: Gaye Pollack, MD;  Location: [place];  Service: Open Heart Surgery;  Laterality: N/A;    Family History  Problem Relation Age of Onset  . Hyperlipidemia Mother   . Alzheimer's disease Mother   . Hyperlipidemia Brother   . Alzheimer's disease Maternal Grandmother   . Colon cancer Neg Hx   . Esophageal cancer Neg Hx   . Stomach cancer Neg Hx     Allergies  Allergen Reactions  . Crestor [Rosuvastatin Calcium]     Reported intolerance to Lipitor in the past Crestor 40mg  patient reported muscle aches, dose reduced to rechallenge    Current Outpatient Medications on File Prior to Visit  Medication Sig Dispense Refill  . allopurinol (ZYLOPRIM) 100 MG tablet Take 100 mg by mouth daily.     Marland Kitchen  apixaban (ELIQUIS) 5 MG TABS tablet Take 1 tablet (5 mg total) by mouth 2 (two) times daily. 180 tablet 1  . atorvastatin (LIPITOR) 10 MG tablet TAKE 1 TABLET(10 MG) BY MOUTH DAILY 90 tablet 3  . carvedilol (COREG) 3.125 MG tablet TAKE 1 TABLET BY MOUTH  TWICE DAILY WITH MEALS 180 tablet 3  . ezetimibe (ZETIA) 10 MG tablet Take 0.5 tablets (5 mg total) by mouth daily. 45 tablet 3  . hydrocortisone cream 1 % Apply 1 application topically as needed for itching. Do NOT apply to sternal wound 30 g 0  . propranolol (INDERAL) 10 MG tablet Take 1 tablet (10 mg total) by mouth 4 (four) times daily. As needed 30 tablet 0   No current facility-administered medications on file prior to visit.    BP 137/70   Pulse (!) 55   Temp 98.2 F (  36.8 C)   Resp 18   Ht 5\' 9"  (1.753 m)   Wt 185 lb (83.9 kg)   SpO2 96%   BMI 27.32 kg/m       Objective:   Physical Exam  General Mental Status- Alert. General Appearance- Not in acute distress.   Skin General: Color- Normal Color. Moisture- Normal Moisture.  Neck Carotid Arteries- Normal color. Moisture- Normal Moisture. No carotid bruits. No JVD.  Chest and Lung Exam Auscultation: Breath Sounds:-Normal.  Cardiovascular Auscultation:Rythm- Regular. Murmurs & Other Heart Sounds:Auscultation of the heart reveals- No Murmurs.  Abdomen Inspection:-Inspeection Normal. Palpation/Percussion:Note:No mass. Palpation and Percussion of the abdomen reveal- Non Tender, Non Distended + BS, no rebound or guarding.  Neurologic Cranial Nerve exam:- CN III-XII intact(No nystagmus), symmetric smile. Strength:- 5/5 equal and symmetric strength both upper and lower extremities.      Assessment & Plan:  History of coronary artery disease, atrial fibrillation and MI.  Continue to follow-up with cardiologist on regular basis.  Currently rate controlled.  Continue Coreg and use propanolol if needed per cardiologist recommendation.    History of diverticulitis,  ruptured diverticulum and colon polyps.  I went ahead and placed referral back to your gastroenterologist as it recommended repeat colonoscopy in 5 years if polyp was hyperplastic.  Would defer when to repeat to GI MD.  Hopefully they will call you soon.  If they do not then recommend calling them directly.  Slight increased frequent urination and on review I do not see prior PSA done.  We will get a PSA today.  If your levels are elevated then would recommend urologist evaluation.  Also on review of prior labs urine does show small amount of hemoglobin in 2018.  Will get urine POCT today.  For hyperlipidemia continue with atorvastatin and Zetia.  Follow-up recommend 2 months or as needed.  Mackie Pai, PA-C   Time spent with patient today was 45  minutes which consisted of chart review, discussing diagnoses, work up, treatment and documentation.

## 2021-03-03 NOTE — Patient Instructions (Addendum)
History of coronary artery disease, atrial fibrillation and MI.  Continue to follow-up with cardiologist on regular basis.  Currently rate controlled.  Continue Coreg and use propanolol if needed per cardiologist recommendation.    History of diverticulitis, ruptured diverticulum and colon polyps.  I went ahead and placed referral back to your gastroenterologist as it recommended repeat colonoscopy in 5 years if polyp was hyperplastic.  Would defer when to repeat to GI MD.  Hopefully they will call you soon.  If they do not then recommend calling them directly.  Slight increased frequent urination and on review I do not see prior PSA done.  We will get a PSA today.  If your levels are elevated then would recommend urologist evaluation.  Also on review of prior labs urine does show small amount of hemoglobin in 2018.  Will get urine POCT today.  For hyperlipidemia continue with atorvastatin and Zetia.  Shingles vaccine today. Will get pneumovaccine on follow up per our discussion.. 2nd shingles vaccine on follow up in 2 months as well.  Follow-up recommend 2 months or as needed.

## 2021-03-10 ENCOUNTER — Telehealth: Payer: Self-pay | Admitting: Gastroenterology

## 2021-03-10 NOTE — Telephone Encounter (Signed)
Pt is requesting a call back from a nurse to discuss if he really needs to schedule a colonoscopy

## 2021-03-10 NOTE — Telephone Encounter (Signed)
Left message for patient to call back  

## 2021-03-11 NOTE — Telephone Encounter (Signed)
Patient was due for a colonoscopy in 11/2018.  He questions why he needs a repeat procedure. He has a hx of colon polyps and resection in 2019 for diverticulitis.  We reviewed the reason behind colon cancer screenings and that it is a preventative to colon cancer.  He wants to discuss further with his GP and he states he may call back in the fall.  He is advised that he is on Eliquis and will need an office visit prior to colonoscopy.

## 2021-03-15 ENCOUNTER — Telehealth: Payer: Self-pay | Admitting: Medical

## 2021-03-15 NOTE — Telephone Encounter (Signed)
I had ordered psa due to pt report of some frequent urination. He did not get the study on day of visit. Will you call and get him scheduled for that lab.

## 2021-03-16 NOTE — Addendum Note (Signed)
Addended by: Kelle Darting A on: 03/16/2021 08:57 AM   Modules accepted: Orders

## 2021-03-16 NOTE — Telephone Encounter (Signed)
Appt made

## 2021-03-17 ENCOUNTER — Other Ambulatory Visit: Payer: Self-pay

## 2021-03-17 ENCOUNTER — Other Ambulatory Visit (INDEPENDENT_AMBULATORY_CARE_PROVIDER_SITE_OTHER): Payer: Medicare Other

## 2021-03-17 DIAGNOSIS — R350 Frequency of micturition: Secondary | ICD-10-CM

## 2021-03-17 LAB — PSA: PSA: 0.57 ng/mL (ref 0.10–4.00)

## 2021-03-18 ENCOUNTER — Other Ambulatory Visit: Payer: Self-pay | Admitting: Cardiovascular Disease

## 2021-05-02 ENCOUNTER — Encounter: Payer: Self-pay | Admitting: Medical

## 2021-05-07 ENCOUNTER — Encounter: Payer: Self-pay | Admitting: Cardiovascular Disease

## 2021-05-07 NOTE — Progress Notes (Signed)
Cardiology Office Note   Date:  05/08/2021   ID:  Dean [name], DOB 05/27/1946, MRN [id number]  PCP:  Dean Pai, PA-C  Cardiologist:   Dean Moores, MD   Chief Complaint  Patient presents with  . Coronary Artery Disease  . Atrial Fibrillation   Problem list 1. CAD  - CABG Nov. 2018 2.   Post of atrial fib  3   Hyperlipidemia 4. Gout 5. GERD  6.  CVA - prior to diagnosis of atrial fib      Dean [name] is a 75 y.o. male who presents for further evaluation of some chest pain and shortness of breath. I have reviewed records from [facility] center.  Has intermittant symptoms of DOE and CP Has occasional palpitations  Is hoarse at times  Has had a stress test - normal  Echo was normal .   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   Oct. 9, 2017:  Has started exercising some.   Is having gout issues which limits his exercise at time  Able to walk around without any significant CP or dyspnea.    Aug. 20, 2018  Dean [name] is seen back for follow-up visit for his premature ventricular contractions. Is taking Uloric for gouty tophi, Makes him nauseated, fatigued.   Has not had any exertional CP recently  Is on Indocin Saw his GP , saw something odd on ECG and he was sent here.  ECG here shows NSR , NS ST abn.  PVCs  Does not exercise,   No hx of CP or DOE Has rare episodes of CP when he is lying down .   March 08, 2018: Doing well from a cardiac standpont  Has had CABG , post op AF .   Nicely from a cardiac standpoint but then developed diverticulitis requiring surgery in mid March.  He was told by somebody during that hospitalization that he may have had a brief episode of atrial fibrillation.  There are no recorded episodes of atrial for ablation on the telemetry  strips and no EKGs suggesting atrial fibrillation.   He related a couple other complaints.  He has a bad taste in his mouth and has no appetite.  He also has had profound itching.  He thinks that it might be due to the warfarin therapy.  June 07, 2018:  Dean [name] is seen today for follow-up of his coronary artery disease and postoperative atrial fibrillation. Feeling great,   Walks 6 days a week at a rapid pace. No CP or dyspnea   Several concerns  - has some tenderness along his SVG harvest site on his left lower leg. Has some sternal tenderness also .   Jan. 10, 2020  Dean [name] is seen today for follow-up of his coronary artery disease and coronary artery bypass grafting.  He also has a history of hyperlipidemia and gout. No recent issues with gout.   Controlled with Uloric  Walks 1 hr a day ( walk / run)  No DOE on hills , no angina  Still has some chest wall tenderness. Has SVG harvest site numbness  Having back issues.    Wonders if its due to Atorvastatin .   March 21, 2020:  Dean [name] is seen today  for follow-up of his coronary artery disease, coronary artery bypass grafting, atrial fibrillation, hyperlipidemia and gout.    He has a history of chronic systolic congestive heart failure following his myocardial infarction in 2018. He has a history of postoperative atrial fibrillation and and is back in atrial fibrillation today.  He is back in atrial fibrillation today. He cannot tell that he is   We will be restarting Eliquis 5 mg twice a day Discontinue aspirin Repeat echocardiogram for further assessment of his CHF.  Consider adding low-dose ARB if his LV functions is still depressed. We will have him see an APP in 1 month to determine whether or not he needs cardioversion and I will see him again in 6 months.  Nov. 16, 2021: Dean [name] is seen today for follow up of his CAD, CHF, PAF  His original EF was 35%. Follow up echo in April, 2021 showed an EF of 55-60%. Moderate MR  On  Eliquis  He has occasional episodes of tachycardia. Fit bit shows HR of 160 We discussed Kardia monitor Propranolol 10 mg tabs QID PRN   May 08, 2021: Dean [name] is seen today for follow up visit  Has had his first shingles shot Had a bad reaction to the shingles vaccine     Past Medical History:  Diagnosis Date  . Acid reflux    history of  . Anemia   . Atrial fibrillation ([place]) 10/2017   RVR  . Coronary artery disease   . Diverticulitis 11/26/2017  . Gout   . Hepatitis A age 75  . History of kidney stones   . Hyperlipemia   . Hypogonadism male   . Irregular heart beat   . Ischemic cardiomyopathy   . Non compliance with medical treatment   . Non-STEMI (non-ST elevated myocardial infarction) (Dean [name]) 10/2017  . Pelvic abscess in male [facility])    percutaneous drain placement    Past Surgical History:  Procedure Laterality Date  . BLADDER REPAIR  02/17/2018   Procedure: BLADDER REPAIR;  Surgeon: Ileana Roup, MD;  [facility];  Service: General;;  . BLADDER REPAIR  02/17/2018   Procedure: pelvic exploration, assist with bladder repair ;  Surgeon: Raynelle Bring, MD;  [facility];  Service: Urology;;  . COLONOSCOPY  ~ 2014   Dr Ferdinand Lango in [facility].  Diverticulosis.  pt denies colon polyps.    . CORONARY ARTERY BYPASS GRAFT N/A 10/26/2017   Procedure: CORONARY ARTERY BYPASS GRAFTING (CABG) x four , using left internal mammary artery and bilateral legs greater saphenous vein harvested endoscopically;  Surgeon: Gaye Pollack, MD;  Location: [place] OR;  Service: Open Heart Surgery;  Laterality: N/A;  . CYSTOSCOPY W/ URETERAL STENT PLACEMENT Bilateral 02/17/2018   Procedure: CYSTOSCOPY WITH RETROGRADE PYELOGRAM/URETERAL STENT PLACEMENT;  Surgeon: Ardis Hughs, MD;  [facility];  Service: Urology;  Laterality: Bilateral;  . IR CATHETER TUBE CHANGE  01/27/2018  . IR RADIOLOGIST EVAL & MGMT  12/21/2017  . LAPAROSCOPIC SIGMOID COLECTOMY N/A 02/17/2018   Procedure:  LAPAROSCOPIC SIGMOID COLECTOMY  ERAS PATHWAY TAKEDOWN OF COLOVESSICAL COLOCUTANEOUS FISTULA;  Surgeon: Ileana Roup, MD;  [facility];  Service: General;  Laterality: N/A;  . LEFT HEART CATH AND CORONARY ANGIOGRAPHY N/A 10/25/2017   Procedure: LEFT HEART CATH AND CORONARY ANGIOGRAPHY;  Surgeon: Leonie Man, MD;  Location: [place] [facility];  Service: Cardiovascular;  Laterality: N/A;  . SIGMOIDOSCOPY N/A 02/17/2018   Procedure: FLEX SIGMOIDOSCOPY;  Surgeon: Ileana Roup,  MD;  [facility];  Service: General;  Laterality: N/A;  . TEE WITHOUT CARDIOVERSION N/A 10/26/2017   Procedure: TRANSESOPHAGEAL ECHOCARDIOGRAM (TEE);  Surgeon: Gaye Pollack, MD;  Location: [place];  Service: Open Heart Surgery;  Laterality: N/A;     Current Outpatient Medications  Medication Sig Dispense Refill  . allopurinol (ZYLOPRIM) 100 MG tablet Take 100 mg by mouth daily.     Marland Kitchen apixaban (ELIQUIS) 5 MG TABS tablet Take 1 tablet (5 mg total) by mouth 2 (two) times daily. 180 tablet 1  . atorvastatin (LIPITOR) 10 MG tablet TAKE 1 TABLET BY MOUTH  DAILY 90 tablet 2  . carvedilol (COREG) 3.125 MG tablet TAKE 1 TABLET BY MOUTH  TWICE DAILY WITH MEALS 180 tablet 3  . ezetimibe (ZETIA) 10 MG tablet TAKE ONE-HALF TABLET BY  MOUTH DAILY 45 tablet 1  . hydrocortisone cream 1 % Apply 1 application topically as needed for itching. Do NOT apply to sternal wound 30 g 0  . propranolol (INDERAL) 10 MG tablet Take 1 tablet (10 mg total) by mouth 4 (four) times daily. As needed 30 tablet 0   No current facility-administered medications for this visit.    Allergies:   Crestor [rosuvastatin calcium]    Social History:  The patient  reports that he has quit smoking. He has never used smokeless tobacco. He reports current alcohol use. He reports that he does not use drugs.   Family History:  The patient's family history includes Alzheimer's disease in his maternal grandmother and mother; Hyperlipidemia in  his brother and mother.    ROS:     Physical Exam: Blood pressure 120/75, pulse 66, height 5' 9.5" (1.765 m), weight 180 lb 12.8 oz (82 kg), SpO2 97 %.  GEN:  Well nourished, well developed in no acute distress HEENT: Normal NECK: No JVD; No carotid bruits LYMPHATICS: No lymphadenopathy CARDIAC: irreg. Irreg.  RESPIRATORY:  Clear to auscultation without rales, wheezing or rhonchi  ABDOMEN: Soft, non-tender, non-distended MUSCULOSKELETAL:  No edema; No deformity  SKIN: Warm and dry NEUROLOGIC:  Alert and oriented x 3   EKG:   May 08, 2021: Atrial fib, LBBB , NS ST or T wave changes.     Recent Labs: 10/21/2020: ALT 11; BUN 18; Creatinine, Ser 1.15; Potassium 4.5; Sodium 140    Lipid Panel    Component Value Date/Time   CHOL 170 10/21/2020 1013   TRIG 174 (H) 10/21/2020 1013   HDL 48 10/21/2020 1013   CHOLHDL 3.5 10/21/2020 1013   CHOLHDL 3.3 10/25/2017 0341   VLDL 21 10/25/2017 0341   LDLCALC 92 10/21/2020 1013      Wt Readings from Last 3 Encounters:  05/08/21 180 lb 12.8 oz (82 kg)  03/03/21 185 lb (83.9 kg)  10/21/20 177 lb (80.3 kg)      Other studies Reviewed: Additional studies/ records that were reviewed today include: . Review of the above records demonstrates:   ASSESSMENT AND PLAN:  1.  CAD - s/p CABG  -  No angina   2. Atrial fib;   He is in Afib all the time now. Would like to consider an antiarrhythmic. Will refer him to the afib clinic for discussion of his choice of antiarrhytjmic.  He has CAD, LBBB    3.  Chronic systolic congestive heart failure: stable   3.  Hyperlipidemia -  Chol is 170.   Trigs are 174.    LDL is 92  Needs to work on better diet  Is intolerent to rosuvastatin  Does not want to increase his atorvastatin  He will work on his diet ,  4.  History of gout:    Current medicines are reviewed at length with the patient today.  The patient does not have concerns regarding medicines.  The following changes have  been made:  no change  Labs/ tests ordered today include:   Orders Placed This Encounter  Procedures  . Amb Referral to AFIB Clinic  . EKG 12-Lead     Disposition:   Follow-up with APP in 6 months     Dean Moores, MD  05/08/2021 11:46 AM    [facility] [address], [place], [place]  [address] Phone: [phone]; Fax: [phone]

## 2021-05-08 ENCOUNTER — Other Ambulatory Visit: Payer: Self-pay

## 2021-05-08 ENCOUNTER — Encounter: Payer: Self-pay | Admitting: Cardiovascular Disease

## 2021-05-08 ENCOUNTER — Ambulatory Visit: Payer: Medicare Other | Admitting: Cardiovascular Disease

## 2021-05-08 VITALS — BP 120/75 | HR 66 | Ht 69.5 in | Wt 180.8 lb

## 2021-05-08 DIAGNOSIS — I2510 Atherosclerotic heart disease of native coronary artery without angina pectoris: Secondary | ICD-10-CM

## 2021-05-08 DIAGNOSIS — I480 Paroxysmal atrial fibrillation: Secondary | ICD-10-CM

## 2021-05-08 NOTE — Patient Instructions (Signed)
Medication Instructions:  Your physician recommends that you continue on your current medications as directed. Please refer to the Current Medication list given to you today.  *If you need a refill on your cardiac medications before your next appointment, please call your pharmacy*   Lab Work: None If you have labs (blood work) drawn today and your tests are completely normal, you will receive your results only by: Marland Kitchen MyChart Message (if you have MyChart) OR . A paper copy in the mail If you have any lab test that is abnormal or we need to change your treatment, we will call you to review the results.   Testing/Procedures: None   Follow-Up:  You have been referred to the Atrial Fibrillation Clinic to discuss possibly starting Tikosyn.    At [facility], you and your health needs are our priority.  As part of our continuing mission to provide you with exceptional heart care, we have created designated Provider Care Teams.  These Care Teams include your primary Cardiologist (physician) and Advanced Practice Providers (APPs -  Physician Assistants and Nurse Practitioners) who all work together to provide you with the care you need, when you need it.  We recommend signing up for the patient portal called "MyChart".  Sign up information is provided on this After Visit Summary.  MyChart is used to connect with patients for Virtual Visits (Telemedicine).  Patients are able to view lab/test results, encounter notes, upcoming appointments, etc.  Non-urgent messages can be sent to your provider as well.   To learn more about what you can do with MyChart, go to [email or web address].    Your next appointment:   6 month(s)  The format for your next appointment:   In Person  Provider:   You may see Mertie Moores, MD or one of the following Advanced Practice Providers on your designated Care Team:    Kathyrn Drown, NP    Other Instructions

## 2021-05-21 ENCOUNTER — Telehealth: Payer: Self-pay | Admitting: Pharmacist

## 2021-05-21 ENCOUNTER — Ambulatory Visit (HOSPITAL_COMMUNITY)
Admission: RE | Admit: 2021-05-21 | Discharge: 2021-05-21 | Disposition: A | Discharge status: Home / Self Care | Payer: Medicare Other | Source: Ambulatory Visit | Attending: Nurse Practitioner | Admitting: Nurse Practitioner

## 2021-05-21 ENCOUNTER — Other Ambulatory Visit: Payer: Self-pay

## 2021-05-21 ENCOUNTER — Encounter (HOSPITAL_COMMUNITY): Payer: Self-pay | Admitting: Nurse Practitioner

## 2021-05-21 VITALS — BP 138/88 | HR 70 | Ht 69.5 in | Wt 182.0 lb

## 2021-05-21 DIAGNOSIS — Z79899 Other long term (current) drug therapy: Secondary | ICD-10-CM | POA: Diagnosis not present

## 2021-05-21 DIAGNOSIS — Z87891 Personal history of nicotine dependence: Secondary | ICD-10-CM | POA: Diagnosis not present

## 2021-05-21 DIAGNOSIS — I4819 Other persistent atrial fibrillation: Secondary | ICD-10-CM | POA: Insufficient documentation

## 2021-05-21 DIAGNOSIS — I509 Heart failure, unspecified: Secondary | ICD-10-CM | POA: Insufficient documentation

## 2021-05-21 DIAGNOSIS — Z7901 Long term (current) use of anticoagulants: Secondary | ICD-10-CM | POA: Diagnosis not present

## 2021-05-21 DIAGNOSIS — I2510 Atherosclerotic heart disease of native coronary artery without angina pectoris: Secondary | ICD-10-CM | POA: Diagnosis not present

## 2021-05-21 DIAGNOSIS — Z888 Allergy status to other drugs, medicaments and biological substances status: Secondary | ICD-10-CM | POA: Insufficient documentation

## 2021-05-21 LAB — BASIC METABOLIC PANEL
Anion gap: 6 (ref 5–15)
BUN: 21 mg/dL (ref 8–23)
CO2: 28 mmol/L (ref 22–32)
Calcium: 9.4 mg/dL (ref 8.9–10.3)
Chloride: 104 mmol/L (ref 98–111)
Creatinine, Ser: 1.22 mg/dL (ref 0.61–1.24)
GFR, Estimated: >60 mL/min (ref >60)
Glucose, Bld: 108 mg/dL — ABNORMAL HIGH (ref 70–99)
Potassium: 4.3 mmol/L (ref 3.5–5.1)
Sodium: 138 mmol/L (ref 135–145)

## 2021-05-21 LAB — MAGNESIUM: Magnesium: 2 mg/dL (ref 1.7–2.4)

## 2021-05-21 NOTE — Telephone Encounter (Signed)
Medication list reviewed in anticipation of upcoming Tikosyn initiation. Patient is not taking any contraindicated or QTc prolonging medications.   Patient is anticoagulated on Eliquis 5mg BID on the appropriate dose. Please ensure that patient has not missed any anticoagulation doses in the 3 weeks prior to Tikosyn initiation.   Patient will need to be counseled to avoid use of Benadryl while on Tikosyn and in the 2-3 days prior to Tikosyn initiation. 

## 2021-05-21 NOTE — Progress Notes (Addendum)
Primary Care Physician: Dean [name] Referring Physician: Dr. Louanna Raw [name] is a 75 y.o. male with a h/o CAD, diverticulosis s/p colectomy,  s/p bypass, CHF, PAF, moderate MR, that is in the afib clinic per Dr. Acie [name] to discuss options for persistent afib to restore SR. He is currently on Eliquis 5 mg bid for a CHA2DS2VASc  score of at least 3. Pt on carvedilol 3.125 mg bid for rate control. Ekg shows afib with v rate of 70 bpm.   Pt denies tobacco use, minimal use of alcohol, some snoring but denies daytime somnolence. He has minimal symptoms from afib and is rate controlled. He feels that his afib has been persistent for  at least 6 months.    Today, he denies symptoms of palpitations, chest pain, shortness of breath, orthopnea, PND, lower extremity edema, dizziness, presyncope, syncope, or neurologic sequela. The patient is tolerating medications without difficulties and is otherwise without complaint today.   Past Medical History:  Diagnosis Date   Acid reflux    history of   Anemia    Atrial fibrillation ([place]) 10/2017   RVR   Coronary artery disease    Diverticulitis 11/26/2017   Gout    Hepatitis A age 89   History of kidney stones    Hyperlipemia    Hypogonadism male    Irregular heart beat    Ischemic cardiomyopathy    Non compliance with medical treatment    Non-STEMI (non-ST elevated myocardial infarction) ([place]) 10/2017   Pelvic abscess in male [facility])    percutaneous drain placement   Past Surgical History:  Procedure Laterality Date   BLADDER REPAIR  02/17/2018   Procedure: BLADDER REPAIR;  Surgeon: Ileana Roup, MD;  [facility];  Service: General;;   BLADDER REPAIR  02/17/2018   Procedure: pelvic exploration, assist with bladder repair ;  Surgeon: Raynelle Bring, MD;  [facility];  Service: Urology;;   COLONOSCOPY  ~ 2014   Dr Ferdinand Lango in [facility].  Diverticulosis.  pt denies colon polyps.     CORONARY ARTERY BYPASS GRAFT N/A  10/26/2017   Procedure: CORONARY ARTERY BYPASS GRAFTING (CABG) x four , using left internal mammary artery and bilateral legs greater saphenous vein harvested endoscopically;  Surgeon: Gaye Pollack, MD;  Location: [place] [facility];  Service: Open Heart Surgery;  Laterality: N/A;   CYSTOSCOPY W/ URETERAL STENT PLACEMENT Bilateral 02/17/2018   Procedure: CYSTOSCOPY WITH RETROGRADE PYELOGRAM/URETERAL STENT PLACEMENT;  Surgeon: Ardis Hughs, MD;  [facility];  Service: Urology;  Laterality: Bilateral;   IR CATHETER TUBE CHANGE  01/27/2018   IR RADIOLOGIST EVAL & MGMT  12/21/2017   LAPAROSCOPIC SIGMOID COLECTOMY N/A 02/17/2018   Procedure: LAPAROSCOPIC SIGMOID COLECTOMY  ERAS PATHWAY TAKEDOWN OF COLOVESSICAL COLOCUTANEOUS FISTULA;  Surgeon: Ileana Roup, MD;  [facility];  Service: General;  Laterality: N/A;   LEFT HEART CATH AND CORONARY ANGIOGRAPHY N/A 10/25/2017   Procedure: LEFT HEART CATH AND CORONARY ANGIOGRAPHY;  Surgeon: Leonie Man, MD;  Location: [place] [facility];  Service: Cardiovascular;  Laterality: N/A;   SIGMOIDOSCOPY N/A 02/17/2018   Procedure: FLEX SIGMOIDOSCOPY;  Surgeon: Ileana Roup, MD;  [facility];  Service: General;  Laterality: N/A;   TEE WITHOUT CARDIOVERSION N/A 10/26/2017   Procedure: TRANSESOPHAGEAL ECHOCARDIOGRAM (TEE);  Surgeon: Gaye Pollack, MD;  Location: [place];  Service: Open Heart Surgery;  Laterality: N/A;    Current Outpatient Medications  Medication Sig Dispense Refill  allopurinol (ZYLOPRIM) 100 MG tablet Take 100 mg by mouth daily.      apixaban (ELIQUIS) 5 MG TABS tablet Take 1 tablet (5 mg total) by mouth 2 (two) times daily. 180 tablet 1   atorvastatin (LIPITOR) 10 MG tablet TAKE 1 TABLET BY MOUTH  DAILY 90 tablet 2   carvedilol (COREG) 3.125 MG tablet TAKE 1 TABLET BY MOUTH  TWICE DAILY WITH MEALS 180 tablet 3   ezetimibe (ZETIA) 10 MG tablet TAKE ONE-HALF TABLET BY  MOUTH DAILY 45 tablet 1   hydrocortisone cream 1 %  Apply 1 application topically as needed for itching. Do NOT apply to sternal wound 30 g 0   propranolol (INDERAL) 10 MG tablet Take 1 tablet (10 mg total) by mouth 4 (four) times daily. As needed 30 tablet 0   No current facility-administered medications for this encounter.    Allergies  Allergen Reactions   Crestor [Rosuvastatin Calcium]     Reported intolerance to Lipitor in the past Crestor 40mg  patient reported muscle aches, dose reduced to rechallenge    Social History   Socioeconomic History   Marital status: Single    Spouse name: Not on file   Number of children: Not on file   Years of education: Not on file   Highest education level: Not on file  Occupational History   Occupation: retired  Tobacco Use   Smoking status: Former    Pack years: 0.00   Smokeless tobacco: Never  Vaping Use   Vaping Use: Never used  Substance and Sexual Activity   Alcohol use: Yes    Alcohol/week: 0.0 standard drinks    Comment: once monthly   Drug use: No   Sexual activity: Not on file  Other Topics Concern   Not on file  Social History Narrative   Not on file   Social Determinants of Health   Financial Resource Strain: Not on file  Food Insecurity: Not on file  Transportation Needs: Not on file  Physical Activity: Not on file  Stress: Not on file  Social Connections: Not on file  Intimate Partner Violence: Not on file    Family History  Problem Relation Age of Onset   Hyperlipidemia Mother    Alzheimer's disease Mother    Hyperlipidemia Brother    Alzheimer's disease Maternal Grandmother    Colon cancer Neg Hx    Esophageal cancer Neg Hx    Stomach cancer Neg Hx     ROS- All systems are reviewed and negative except as per the HPI above  Physical Exam: There were no vitals filed for this visit. Wt Readings from Last 3 Encounters:  05/08/21 82 kg  03/03/21 83.9 kg  10/21/20 80.3 kg    Labs: Lab Results  Component Value Date   NA 140 10/21/2020   K 4.5  10/21/2020   CL 105 10/21/2020   CO2 24 10/21/2020   GLUCOSE 98 10/21/2020   BUN 18 10/21/2020   CREATININE 1.15 10/21/2020   CALCIUM 9.2 10/21/2020   PHOS 4.0 02/22/2018   MG 1.8 02/22/2018   Lab Results  Component Value Date   INR 1.41 02/18/2018   Lab Results  Component Value Date   CHOL 170 10/21/2020   HDL 48 10/21/2020   LDLCALC 92 10/21/2020   TRIG 174 (H) 10/21/2020     GEN- The patient is well appearing, alert and oriented x 3 today.   Head- normocephalic, atraumatic Eyes-  Sclera clear, conjunctiva pink Ears- hearing intact Oropharynx- clear  Neck- supple, no JVP Lymph- no cervical lymphadenopathy Lungs- Clear to ausculation bilaterally, normal work of breathing Heart- Regular rate and rhythm, no murmurs, rubs or gallops, PMI not laterally displaced GI- soft, NT, ND, + BS Extremities- no clubbing, cyanosis, or edema MS- no significant deformity or atrophy Skin- no rash or lesion Psych- euthymic mood, full affect Neuro- strength and sensation are intact  EKG-afib at 70 bpm, qrs int 128 ms, qtc 414 ms   Echo-  1. Improvement of the LVEF noted as compared to echo from 2018. Segmental  wall motion involving lateral and inferior wall documented on echo from  2018 not seen on this study. Left ventricular ejection fraction, by  estimation, is 55 to 60%. The left  ventricle has normal function. The left ventricle has no regional wall  motion abnormalities. There is mild left ventricular hypertrophy. Left  ventricular diastolic parameters were normal.   2. Right ventricular systolic function is normal. The right ventricular  size is normal. There is normal pulmonary artery systolic pressure.   3. The mitral valve is normal in structure. Moderate mitral valve  regurgitation. No evidence of mitral stenosis.   4. The aortic valve is normal in structure. Aortic valve regurgitation is  trivial. No aortic stenosis is present.   5. The inferior vena cava is normal in  size with greater than 50%  respiratory variability, suggesting right atrial pressure of 3 mmHg.     Assessment and Plan:  1. Persistent afib Persistent for at least 6 months , rate controlled with minimal symptoms Options of rate controlled afib vrs amiodarone vrs Tikosyn discussed with pt He would like admission for tikosyn Will plan on week of July 11 th  Procedure for tikosyn admit, benefit vrs risk of drug discussed with pt  No benadryl use, no drugs on med list contraindicated, but will send for PharmD review  Discussed use of Good RX to obtain drug Qtc acceptable  Bmet/mag today   2. CHA2DS2VASc  score of  at least 3 Stressed not to muss any anticoagulation prior to admission Continue eliquis 5 mg bid   F/u in afib clinic day of admission 7/12  Dean [name], [facility] [address] [place], [place] [address] [phone]

## 2021-06-15 ENCOUNTER — Other Ambulatory Visit: Payer: Self-pay | Admitting: *Deleted

## 2021-06-15 ENCOUNTER — Other Ambulatory Visit (HOSPITAL_COMMUNITY)
Admission: RE | Admit: 2021-06-15 | Discharge: 2021-06-15 | Disposition: A | Discharge status: Home / Self Care | Payer: Medicare Other | Source: Ambulatory Visit | Attending: Nurse Practitioner | Admitting: Nurse Practitioner

## 2021-06-15 DIAGNOSIS — Z20822 Contact with and (suspected) exposure to covid-19: Secondary | ICD-10-CM | POA: Insufficient documentation

## 2021-06-15 DIAGNOSIS — Z01812 Encounter for preprocedural laboratory examination: Secondary | ICD-10-CM | POA: Insufficient documentation

## 2021-06-15 LAB — SARS CORONAVIRUS 2 (TAT 6-24 HRS): SARS Coronavirus 2: NEGATIVE

## 2021-06-15 MED ORDER — APIXABAN 5 MG PO TABS: 5.0000 mg | ORAL_TABLET | Freq: Two times a day (BID) | ORAL | 1 refills | Status: DC

## 2021-06-15 NOTE — Telephone Encounter (Signed)
Prescription refill request for Eliquis received. Indication: afib  Last office visit: Kayleen Memos, 05/21/2021. Nahser 05/08/2021 Scr: 1.22, 05/21/2021 Age: 75 yo  Weight:  82.6 kg   Pt is on the correct dose of Eliquis per dosing criteria, prescription refill sent for Eliquis 5mg  BID.

## 2021-06-16 ENCOUNTER — Encounter (HOSPITAL_COMMUNITY): Payer: Self-pay | Admitting: Nurse Practitioner

## 2021-06-16 ENCOUNTER — Other Ambulatory Visit: Payer: Self-pay

## 2021-06-16 ENCOUNTER — Encounter (HOSPITAL_COMMUNITY): Payer: Self-pay | Admitting: Cardiology

## 2021-06-16 ENCOUNTER — Inpatient Hospital Stay (HOSPITAL_COMMUNITY)
Admission: AD | Admit: 2021-06-16 | Discharge: 2021-06-19 | DRG: 310 | Disposition: A | Discharge status: Home / Self Care | Payer: Medicare Other | Source: Ambulatory Visit | Attending: Cardiology | Admitting: Cardiology

## 2021-06-16 ENCOUNTER — Ambulatory Visit (HOSPITAL_COMMUNITY)
Admission: RE | Admit: 2021-06-16 | Discharge: 2021-06-16 | Disposition: A | Discharge status: Home / Self Care | Payer: Medicare Other | Source: Ambulatory Visit | Attending: Nurse Practitioner | Admitting: Nurse Practitioner

## 2021-06-16 ENCOUNTER — Other Ambulatory Visit (HOSPITAL_COMMUNITY): Payer: Self-pay

## 2021-06-16 VITALS — BP 136/78 | HR 80 | Ht 69.5 in | Wt 182.4 lb

## 2021-06-16 DIAGNOSIS — I2510 Atherosclerotic heart disease of native coronary artery without angina pectoris: Secondary | ICD-10-CM | POA: Diagnosis present

## 2021-06-16 DIAGNOSIS — Z951 Presence of aortocoronary bypass graft: Secondary | ICD-10-CM | POA: Diagnosis not present

## 2021-06-16 DIAGNOSIS — Z9119 Patient's noncompliance with other medical treatment and regimen: Secondary | ICD-10-CM

## 2021-06-16 DIAGNOSIS — E785 Hyperlipidemia, unspecified: Secondary | ICD-10-CM | POA: Diagnosis present

## 2021-06-16 DIAGNOSIS — K219 Gastro-esophageal reflux disease without esophagitis: Secondary | ICD-10-CM | POA: Diagnosis present

## 2021-06-16 DIAGNOSIS — Z87891 Personal history of nicotine dependence: Secondary | ICD-10-CM | POA: Diagnosis not present

## 2021-06-16 DIAGNOSIS — R001 Bradycardia, unspecified: Secondary | ICD-10-CM | POA: Diagnosis not present

## 2021-06-16 DIAGNOSIS — Z83438 Family history of other disorder of lipoprotein metabolism and other lipidemia: Secondary | ICD-10-CM | POA: Diagnosis not present

## 2021-06-16 DIAGNOSIS — D6869 Other thrombophilia: Secondary | ICD-10-CM

## 2021-06-16 DIAGNOSIS — I255 Ischemic cardiomyopathy: Secondary | ICD-10-CM | POA: Diagnosis present

## 2021-06-16 DIAGNOSIS — I252 Old myocardial infarction: Secondary | ICD-10-CM | POA: Diagnosis not present

## 2021-06-16 DIAGNOSIS — Z79899 Other long term (current) drug therapy: Secondary | ICD-10-CM

## 2021-06-16 DIAGNOSIS — M109 Gout, unspecified: Secondary | ICD-10-CM | POA: Diagnosis present

## 2021-06-16 DIAGNOSIS — I4891 Unspecified atrial fibrillation: Secondary | ICD-10-CM | POA: Diagnosis present

## 2021-06-16 DIAGNOSIS — I4819 Other persistent atrial fibrillation: Secondary | ICD-10-CM

## 2021-06-16 DIAGNOSIS — Z960 Presence of urogenital implants: Secondary | ICD-10-CM | POA: Diagnosis present

## 2021-06-16 DIAGNOSIS — Z9049 Acquired absence of other specified parts of digestive tract: Secondary | ICD-10-CM | POA: Diagnosis not present

## 2021-06-16 DIAGNOSIS — Z20822 Contact with and (suspected) exposure to covid-19: Secondary | ICD-10-CM | POA: Diagnosis present

## 2021-06-16 DIAGNOSIS — Z7901 Long term (current) use of anticoagulants: Secondary | ICD-10-CM | POA: Diagnosis not present

## 2021-06-16 DIAGNOSIS — Z888 Allergy status to other drugs, medicaments and biological substances status: Secondary | ICD-10-CM

## 2021-06-16 LAB — BASIC METABOLIC PANEL
Anion gap: 6 (ref 5–15)
BUN: 20 mg/dL (ref 8–23)
CO2: 29 mmol/L (ref 22–32)
Calcium: 9.5 mg/dL (ref 8.9–10.3)
Chloride: 104 mmol/L (ref 98–111)
Creatinine, Ser: 1.27 mg/dL — ABNORMAL HIGH (ref 0.61–1.24)
GFR, Estimated: 60 mL/min — ABNORMAL LOW (ref >60)
Glucose, Bld: 116 mg/dL — ABNORMAL HIGH (ref 70–99)
Potassium: 4.3 mmol/L (ref 3.5–5.1)
Sodium: 139 mmol/L (ref 135–145)

## 2021-06-16 LAB — MAGNESIUM: Magnesium: 2 mg/dL (ref 1.7–2.4)

## 2021-06-16 MED ORDER — SODIUM CHLORIDE 0.9 % IV SOLN: 250.0000 mL | INTRAVENOUS | Status: DC | PRN

## 2021-06-16 MED ORDER — CARVEDILOL 3.125 MG PO TABS
3.1250 mg | ORAL_TABLET | Freq: Two times a day (BID) | ORAL | Status: DC
  Administered 2021-06-16 – 2021-06-18 (×5): 3.125 mg via ORAL
  Filled 2021-06-16 (×5): qty 1

## 2021-06-16 MED ORDER — EZETIMIBE 10 MG PO TABS
5.0000 mg | ORAL_TABLET | Freq: Every day | ORAL | Status: DC
  Filled 2021-06-16: qty 1

## 2021-06-16 MED ORDER — APIXABAN 5 MG PO TABS
5.0000 mg | ORAL_TABLET | Freq: Two times a day (BID) | ORAL | Status: DC
  Administered 2021-06-16 – 2021-06-19 (×6): 5 mg via ORAL
  Filled 2021-06-16 (×6): qty 1

## 2021-06-16 MED ORDER — EZETIMIBE 10 MG PO TABS
5.0000 mg | ORAL_TABLET | Freq: Every day | ORAL | Status: DC
  Administered 2021-06-16 – 2021-06-18 (×3): 5 mg via ORAL
  Filled 2021-06-16 (×2): qty 1

## 2021-06-16 MED ORDER — SODIUM CHLORIDE 0.9% FLUSH: 3.0000 mL | INTRAVENOUS | Status: DC | PRN

## 2021-06-16 MED ORDER — ALLOPURINOL 100 MG PO TABS
100.0000 mg | ORAL_TABLET | Freq: Every day | ORAL | Status: DC
  Filled 2021-06-16: qty 1

## 2021-06-16 MED ORDER — ATORVASTATIN CALCIUM 10 MG PO TABS
10.0000 mg | ORAL_TABLET | Freq: Every day | ORAL | Status: DC
  Administered 2021-06-16 – 2021-06-18 (×3): 10 mg via ORAL
  Filled 2021-06-16 (×2): qty 1

## 2021-06-16 MED ORDER — MAGNESIUM SULFATE 2 GM/50ML IV SOLN
2.0000 g | Freq: Once | INTRAVENOUS | Status: AC
  Administered 2021-06-16: 2 g via INTRAVENOUS
  Filled 2021-06-16: qty 50

## 2021-06-16 MED ORDER — ALLOPURINOL 100 MG PO TABS
100.0000 mg | ORAL_TABLET | Freq: Every day | ORAL | Status: DC
  Administered 2021-06-16 – 2021-06-18 (×3): 100 mg via ORAL
  Filled 2021-06-16 (×2): qty 1

## 2021-06-16 MED ORDER — SODIUM CHLORIDE 0.9% FLUSH
3.0000 mL | Freq: Two times a day (BID) | INTRAVENOUS | Status: DC
  Administered 2021-06-16 – 2021-06-17 (×3): 3 mL via INTRAVENOUS

## 2021-06-16 MED ORDER — ATORVASTATIN CALCIUM 10 MG PO TABS
10.0000 mg | ORAL_TABLET | Freq: Every day | ORAL | Status: DC
  Filled 2021-06-16: qty 1

## 2021-06-16 MED ORDER — DOFETILIDE 500 MCG PO CAPS
500.0000 ug | ORAL_CAPSULE | Freq: Two times a day (BID) | ORAL | Status: DC
  Administered 2021-06-16 – 2021-06-19 (×6): 500 ug via ORAL
  Filled 2021-06-16 (×6): qty 1

## 2021-06-16 NOTE — Progress Notes (Signed)
Paged cardiology Jonni Sanger) and advised that pt is having long pauses in heart rhythm and heart rate has been seen in the 30's.

## 2021-06-16 NOTE — H&P (Signed)
Primary Care Physician: Elise Benne Referring Physician: Dr. Louanna Raw Dean [name] is a 75 y.o. male with a h/o CAD, diverticulosis s/p colectomy,  s/p bypass, CHF, PAF, moderate MR, that is in the afib clinic per Dr. Acie Fredrickson to discuss options for persistent afib to restore SR. He is currently on Eliquis 5 mg bid for a CHA2DS2VASc  score of at least 3. Pt on carvedilol 3.125 mg bid for rate control. Ekg shows afib with v rate of 70 bpm.   F/u 06/16/21. Pt is here for tikosyn admit. No benadryl use. PharmD did not see any drugs that were contraindicated with Tikosyn. No benadryl use. He reminds in afib rate controlled, qt is acceptable.   Pt denies tobacco use, minimal use of alcohol, some snoring but denies daytime somnolence. He has minimal symptoms from afib and is rate controlled. He feels that his afib has been persistent for  at least 6 months.    Today, he denies symptoms of palpitations, chest pain, shortness of breath, orthopnea, PND, lower extremity edema, dizziness, presyncope, syncope, or neurologic sequela. The patient is tolerating medications without difficulties and is otherwise without complaint today.   Past Medical History:  Diagnosis Date   Acid reflux    history of   Anemia    Atrial fibrillation ([place]) 10/2017   RVR   Coronary artery disease    Diverticulitis 11/26/2017   Gout    Hepatitis A age 70   History of kidney stones    Hyperlipemia    Hypogonadism male    Irregular heart beat    Ischemic cardiomyopathy    Non compliance with medical treatment    Non-STEMI (non-ST elevated myocardial infarction) ([place]) 10/2017   Pelvic abscess in male [facility])    percutaneous drain placement   Past Surgical History:  Procedure Laterality Date   BLADDER REPAIR  02/17/2018   Procedure: BLADDER REPAIR;  Surgeon: Ileana Roup, MD;  [facility];  Service: General;;   BLADDER REPAIR  02/17/2018   Procedure: pelvic exploration, assist with bladder  repair ;  Surgeon: Raynelle Bring, MD;  [facility];  Service: Urology;;   COLONOSCOPY  ~ 2014   Dr Ferdinand Lango in [facility].  Diverticulosis.  pt denies colon polyps.     CORONARY ARTERY BYPASS GRAFT N/A 10/26/2017   Procedure: CORONARY ARTERY BYPASS GRAFTING (CABG) x four , using left internal mammary artery and bilateral legs greater saphenous vein harvested endoscopically;  Surgeon: Gaye Pollack, MD;  Location: [place] [facility];  Service: Open Heart Surgery;  Laterality: N/A;   CYSTOSCOPY W/ URETERAL STENT PLACEMENT Bilateral 02/17/2018   Procedure: CYSTOSCOPY WITH RETROGRADE PYELOGRAM/URETERAL STENT PLACEMENT;  Surgeon: Ardis Hughs, MD;  [facility];  Service: Urology;  Laterality: Bilateral;   IR CATHETER TUBE CHANGE  01/27/2018   IR RADIOLOGIST EVAL & MGMT  12/21/2017   LAPAROSCOPIC SIGMOID COLECTOMY N/A 02/17/2018   Procedure: LAPAROSCOPIC SIGMOID COLECTOMY  ERAS PATHWAY TAKEDOWN OF COLOVESSICAL COLOCUTANEOUS FISTULA;  Surgeon: Ileana Roup, MD;  [facility];  Service: General;  Laterality: N/A;   LEFT HEART CATH AND CORONARY ANGIOGRAPHY N/A 10/25/2017   Procedure: LEFT HEART CATH AND CORONARY ANGIOGRAPHY;  Surgeon: Leonie Man, MD;  Location: [place] [facility];  Service: Cardiovascular;  Laterality: N/A;   SIGMOIDOSCOPY N/A 02/17/2018   Procedure: FLEX SIGMOIDOSCOPY;  Surgeon: Ileana Roup, MD;  [facility];  Service: General;  Laterality: N/A;   TEE WITHOUT CARDIOVERSION N/A 10/26/2017  Procedure: TRANSESOPHAGEAL ECHOCARDIOGRAM (TEE);  Surgeon: Gaye Pollack, MD;  Location: [place];  Service: Open Heart Surgery;  Laterality: N/A;    Current Facility-Administered Medications  Medication Dose Route Frequency Provider Last Rate Last Admin   0.9 %  sodium chloride infusion  250 mL Intravenous PRN Sherran Needs, NP       [START ON 06/17/2021] allopurinol (ZYLOPRIM) tablet 100 mg  100 mg Oral Daily Sherran Needs, NP       apixaban Arne Cleveland) tablet 5  mg  5 mg Oral BID Sherran Needs, NP       [START ON 06/17/2021] atorvastatin (LIPITOR) tablet 10 mg  10 mg Oral Daily Sherran Needs, NP       carvedilol (COREG) tablet 3.125 mg  3.125 mg Oral BID WC Sherran Needs, NP       dofetilide (TIKOSYN) capsule 500 mcg  500 mcg Oral BID Sherran Needs, NP       [START ON 06/17/2021] ezetimibe (ZETIA) tablet 5 mg  5 mg Oral Daily Sherran Needs, NP       magnesium sulfate IVPB 2 g 50 mL  2 g Intravenous Once Lyndee Leo, RPH       sodium chloride flush (NS) 0.9 % injection 3 mL  3 mL Intravenous Q12H Sherran Needs, NP       sodium chloride flush (NS) 0.9 % injection 3 mL  3 mL Intravenous PRN Sherran Needs, NP        Allergies  Allergen Reactions   Crestor [Rosuvastatin Calcium]     Reported intolerance to Lipitor in the past Crestor 40mg  patient reported muscle aches, dose reduced to rechallenge    Social History   Socioeconomic History   Marital status: Single    Spouse name: Not on file   Number of children: Not on file   Years of education: Not on file   Highest education level: Not on file  Occupational History   Occupation: retired  Tobacco Use   Smoking status: Former    Pack years: 0.00   Smokeless tobacco: Never  Vaping Use   Vaping Use: Never used  Substance and Sexual Activity   Alcohol use: Yes    Alcohol/week: 0.0 standard drinks    Comment: once monthly   Drug use: No   Sexual activity: Not on file  Other Topics Concern   Not on file  Social History Narrative   Not on file   Social Determinants of Health   Financial Resource Strain: Not on file  Food Insecurity: Not on file  Transportation Needs: Not on file  Physical Activity: Not on file  Stress: Not on file  Social Connections: Not on file  Intimate Partner Violence: Not on file    Family History  Problem Relation Age of Onset   Hyperlipidemia Mother    Alzheimer's disease Mother    Hyperlipidemia Brother    Alzheimer's disease  Maternal Grandmother    Colon cancer Neg Hx    Esophageal cancer Neg Hx    Stomach cancer Neg Hx     ROS- All systems are reviewed and negative except as per the HPI above  Physical Exam: Vitals:   06/16/21 1144  BP: 139/74  Pulse: 70  Resp: 18  TempSrc: Oral  SpO2: 94%   Wt Readings from Last 3 Encounters:  06/16/21 82.7 kg  05/21/21 82.6 kg  05/08/21 82 kg    Labs: Lab Results  Component  Value Date   NA 139 06/16/2021   K 4.3 06/16/2021   CL 104 06/16/2021   CO2 29 06/16/2021   GLUCOSE 116 (H) 06/16/2021   BUN 20 06/16/2021   CREATININE 1.27 (H) 06/16/2021   CALCIUM 9.5 06/16/2021   PHOS 4.0 02/22/2018   MG 2.0 06/16/2021   Lab Results  Component Value Date   INR 1.41 02/18/2018   Lab Results  Component Value Date   CHOL 170 10/21/2020   HDL 48 10/21/2020   LDLCALC 92 10/21/2020   TRIG 174 (H) 10/21/2020     GEN- The patient is well appearing, alert and oriented x 3 today.   Head- normocephalic, atraumatic Eyes-  Sclera clear, conjunctiva pink Ears- hearing intact Oropharynx- clear Neck- supple, no JVP Lymph- no cervical lymphadenopathy Lungs- Clear to ausculation bilaterally, normal work of breathing Heart- Regular rate and rhythm, no murmurs, rubs or gallops, PMI not laterally displaced GI- soft, NT, ND, + BS Extremities- no clubbing, cyanosis, or edema MS- no significant deformity or atrophy Skin- no rash or lesion Psych- euthymic mood, full affect Neuro- strength and sensation are intact  EKG-afib at 80 bpm, qrs int 124 ms, qtc 438 ms   Echo-  1. Improvement of the LVEF noted as compared to echo from 2018. Segmental  wall motion involving lateral and inferior wall documented on echo from  2018 not seen on this study. Left ventricular ejection fraction, by  estimation, is 55 to 60%. The left  ventricle has normal function. The left ventricle has no regional wall  motion abnormalities. There is mild left ventricular hypertrophy. Left   ventricular diastolic parameters were normal.   2. Right ventricular systolic function is normal. The right ventricular  size is normal. There is normal pulmonary artery systolic pressure.   3. The mitral valve is normal in structure. Moderate mitral valve  regurgitation. No evidence of mitral stenosis.   4. The aortic valve is normal in structure. Aortic valve regurgitation is  trivial. No aortic stenosis is present.   5. The inferior vena cava is normal in size with greater than 50%  respiratory variability, suggesting right atrial pressure of 3 mmHg.     Assessment and Plan:  1. Persistent afib Persistent for at least 6 months , rate controlled with minimal symptoms Options of rate controlled afib vrs amiodarone vrs Tikosyn discussed with pt He would like admission for tikosyn He is now here for tikosyn admission  Procedure for tikosyn admit, benefit vrs risk of drug discussed with pt  No benadryl use, no drugs on med list contraindicated,  Discussed use of Good RX to obtain drug Qtc acceptable  Bmet/mag today  Covid negative   2. CHA2DS2VASc  score of  at least 3 No missed doses of drug x 3 weeks  Continue eliquis 5 mg bid   To [place]. Mila Homer [facility] [address] [place], [place] [address] [phone]   I have seen and examined this patient with Roderic Palau.  Agree with above, note added to reflect my findings.  On exam, irregular. Patient presents for tikosyn load.   ECG with AF. QTc acceptable. Creatinine stable. Plan for 500 mcg today.  Laney Bagshaw M. Terrick Allred MD 06/16/2021 12:10 PM

## 2021-06-16 NOTE — Progress Notes (Signed)
Primary Care Physician: Elise Benne Referring Physician: Dr. [name] is a 75 y.o. male with a h/o CAD, diverticulosis s/p colectomy,  s/p bypass, CHF, PAF, moderate MR, that is in the afib clinic per Dr. Acie Fredrickson to discuss options for persistent afib to restore SR. He is currently on Eliquis 5 mg bid for a CHA2DS2VASc  score of at least 3. Pt on carvedilol 3.125 mg bid for rate control. Ekg shows afib with v rate of 70 bpm.   F/u 06/16/21. Pt is here for tikosyn admit. No benadryl use. PharmD did not see any drugs that were contraindicated with Tikosyn. No benadryl use. He reminds in afib rate controlled, qt is acceptable.   Pt denies tobacco use, minimal use of alcohol, some snoring but denies daytime somnolence. He has minimal symptoms from afib and is rate controlled. He feels that his afib has been persistent for  at least 6 months.    Today, he denies symptoms of palpitations, chest pain, shortness of breath, orthopnea, PND, lower extremity edema, dizziness, presyncope, syncope, or neurologic sequela. The patient is tolerating medications without difficulties and is otherwise without complaint today.   Past Medical History:  Diagnosis Date   Acid reflux    history of   Anemia    Atrial fibrillation ([place]) 10/2017   RVR   Coronary artery disease    Diverticulitis 11/26/2017   Gout    Hepatitis A age 3   History of kidney stones    Hyperlipemia    Hypogonadism male    Irregular heart beat    Ischemic cardiomyopathy    Non compliance with medical treatment    Non-STEMI (non-ST elevated myocardial infarction) ([place]) 10/2017   Pelvic abscess in male [facility])    percutaneous drain placement   Past Surgical History:  Procedure Laterality Date   BLADDER REPAIR  02/17/2018   Procedure: BLADDER REPAIR;  Surgeon: Ileana Roup, MD;  Location: [facility];  Service: General;;   BLADDER REPAIR  02/17/2018   Procedure: pelvic exploration, assist with bladder  repair ;  Surgeon: Raynelle Bring, MD;  Location: [facility];  Service: Urology;;   COLONOSCOPY  ~ 2014   Dr Ferdinand Lango in [facility].  Diverticulosis.  pt denies colon polyps.     CORONARY ARTERY BYPASS GRAFT N/A 10/26/2017   Procedure: CORONARY ARTERY BYPASS GRAFTING (CABG) x four , using left internal mammary artery and bilateral legs greater saphenous vein harvested endoscopically;  Surgeon: Gaye Pollack, MD;  Location: [facility];  Service: Open Heart Surgery;  Laterality: N/A;   CYSTOSCOPY W/ URETERAL STENT PLACEMENT Bilateral 02/17/2018   Procedure: CYSTOSCOPY WITH RETROGRADE PYELOGRAM/URETERAL STENT PLACEMENT;  Surgeon: Ardis Hughs, MD;  Location: [facility];  Service: Urology;  Laterality: Bilateral;   IR CATHETER TUBE CHANGE  01/27/2018   IR RADIOLOGIST EVAL & MGMT  12/21/2017   LAPAROSCOPIC SIGMOID COLECTOMY N/A 02/17/2018   Procedure: LAPAROSCOPIC SIGMOID COLECTOMY  ERAS PATHWAY TAKEDOWN OF COLOVESSICAL COLOCUTANEOUS FISTULA;  Surgeon: Ileana Roup, MD;  Location[facility];  Service: General;  Laterality: N/A;   LEFT HEART CATH AND CORONARY ANGIOGRAPHY N/A 10/25/2017   Procedure: LEFT HEART CATH AND CORONARY ANGIOGRAPHY;  Surgeon: Leonie Man, MD;  Location: [place] [facility];  Service: Cardiovascular;  Laterality: N/A;   SIGMOIDOSCOPY N/A 02/17/2018   Procedure: FLEX SIGMOIDOSCOPY;  Surgeon: Ileana Roup, MD;  Location[facility];  Service: General;  Laterality: N/A;   TEE WITHOUT CARDIOVERSION N/A 10/26/2017  Procedure: TRANSESOPHAGEAL ECHOCARDIOGRAM (TEE);  Surgeon: Gaye Pollack, MD;  Location: [place];  Service: Open Heart Surgery;  Laterality: N/A;    Current Outpatient Medications  Medication Sig Dispense Refill   allopurinol (ZYLOPRIM) 100 MG tablet Take 100 mg by mouth daily.      apixaban (ELIQUIS) 5 MG TABS tablet Take 1 tablet (5 mg total) by mouth 2 (two) times daily. 180 tablet 1   atorvastatin (LIPITOR) 10 MG tablet TAKE 1 TABLET BY MOUTH  DAILY 90 tablet 2    carvedilol (COREG) 3.125 MG tablet TAKE 1 TABLET BY MOUTH  TWICE DAILY WITH MEALS 180 tablet 3   ezetimibe (ZETIA) 10 MG tablet TAKE ONE-HALF TABLET BY  MOUTH DAILY 45 tablet 1   hydrocortisone cream 1 % Apply 1 application topically as needed for itching. Do NOT apply to sternal wound 30 g 0   propranolol (INDERAL) 10 MG tablet Take 1 tablet (10 mg total) by mouth 4 (four) times daily. As needed 30 tablet 0   No current facility-administered medications for this encounter.    Allergies  Allergen Reactions   Crestor [Rosuvastatin Calcium]     Reported intolerance to Lipitor in the past Crestor 40mg  patient reported muscle aches, dose reduced to rechallenge    Social History   Socioeconomic History   Marital status: Single    Spouse name: Not on file   Number of children: Not on file   Years of education: Not on file   Highest education level: Not on file  Occupational History   Occupation: retired  Tobacco Use   Smoking status: Former    Pack years: 0.00   Smokeless tobacco: Never  Vaping Use   Vaping Use: Never used  Substance and Sexual Activity   Alcohol use: Yes    Alcohol/week: 0.0 standard drinks    Comment: once monthly   Drug use: No   Sexual activity: Not on file  Other Topics Concern   Not on file  Social History Narrative   Not on file   Social Determinants of Health   Financial Resource Strain: Not on file  Food Insecurity: Not on file  Transportation Needs: Not on file  Physical Activity: Not on file  Stress: Not on file  Social Connections: Not on file  Intimate Partner Violence: Not on file    Family History  Problem Relation Age of Onset   Hyperlipidemia Mother    Alzheimer's disease Mother    Hyperlipidemia Brother    Alzheimer's disease Maternal Grandmother    Colon cancer Neg Hx    Esophageal cancer Neg Hx    Stomach cancer Neg Hx     ROS- All systems are reviewed and negative except as per the HPI above  Physical Exam: Vitals:    06/16/21 1041  BP: 136/78  Pulse: 80  Weight: 82.7 kg  Height: 5' 9.5" (1.765 m)   Wt Readings from Last 3 Encounters:  06/16/21 82.7 kg  05/21/21 82.6 kg  05/08/21 82 kg    Labs: Lab Results  Component Value Date   NA 138 05/21/2021   K 4.3 05/21/2021   CL 104 05/21/2021   CO2 28 05/21/2021   GLUCOSE 108 (H) 05/21/2021   BUN 21 05/21/2021   CREATININE 1.22 05/21/2021   CALCIUM 9.4 05/21/2021   PHOS 4.0 02/22/2018   MG 2.0 05/21/2021   Lab Results  Component Value Date   INR 1.41 02/18/2018   Lab Results  Component Value Date  CHOL 170 10/21/2020   HDL 48 10/21/2020   LDLCALC 92 10/21/2020   TRIG 174 (H) 10/21/2020     GEN- The patient is well appearing, alert and oriented x 3 today.   Head- normocephalic, atraumatic Eyes-  Sclera clear, conjunctiva pink Ears- hearing intact Oropharynx- clear Neck- supple, no JVP Lymph- no cervical lymphadenopathy Lungs- Clear to ausculation bilaterally, normal work of breathing Heart- Regular rate and rhythm, no murmurs, rubs or gallops, PMI not laterally displaced GI- soft, NT, ND, + BS Extremities- no clubbing, cyanosis, or edema MS- no significant deformity or atrophy Skin- no rash or lesion Psych- euthymic mood, full affect Neuro- strength and sensation are intact  EKG-afib at 80 bpm, qrs int 124 ms, qtc 438 ms   Echo-  1. Improvement of the LVEF noted as compared to echo from 2018. Segmental  wall motion involving lateral and inferior wall documented on echo from  2018 not seen on this study. Left ventricular ejection fraction, by  estimation, is 55 to 60%. The left  ventricle has normal function. The left ventricle has no regional wall  motion abnormalities. There is mild left ventricular hypertrophy. Left  ventricular diastolic parameters were normal.   2. Right ventricular systolic function is normal. The right ventricular  size is normal. There is normal pulmonary artery systolic pressure.   3. The mitral  valve is normal in structure. Moderate mitral valve  regurgitation. No evidence of mitral stenosis.   4. The aortic valve is normal in structure. Aortic valve regurgitation is  trivial. No aortic stenosis is present.   5. The inferior vena cava is normal in size with greater than 50%  respiratory variability, suggesting right atrial pressure of 3 mmHg.     Assessment and Plan:  1. Persistent afib Persistent for at least 6 months , rate controlled with minimal symptoms Options of rate controlled afib vrs amiodarone vrs Tikosyn discussed with pt He would like admission for tikosyn He is now here for tikosyn admission  Procedure for tikosyn admit, benefit vrs risk of drug discussed with pt  No benadryl use, no drugs on med list contraindicated,  Discussed use of [facility] to obtain drug Qtc acceptable  Bmet/mag today  Covid negative   2. CHA2DS2VASc  score of  at least 3 No missed doses of drug x 3 weeks  Continue eliquis 5 mg bid   To [place]. Unknown Schleyer, [facility] [address] [place], [place] [address] [phone]

## 2021-06-16 NOTE — TOC Benefit Eligibility Note (Signed)
Transition of Care [facility]) Benefit Eligibility Note    Patient Details  Name: Dean [name] MRN: [id number] Date of Birth: [birth year]   Medication/Dose: DOFETILIDE  500 MCG BID  Covered?: Yes  Tier: 3 Drug  Prescription Coverage [facility]  Spoke with Person/Company/Phone Number:: EGBTD   @  [place] [facility] # [phone]  Co-Pay: $ 11.77  Prior Approval: No  Deductible: Met (OUT-OF-POCKET:MET)  Additional Notes: TIKOSYN 500 MCG BID  : NOT COVER / NON-FORMULARY   P/A-YES # [phone]    Memory Argue Phone Number: 06/16/2021, 1:16 PM

## 2021-06-16 NOTE — TOC Benefit Eligibility Note (Signed)
    Transition of Care [facility]) Benefit Eligibility Note    Patient Details  Name: Dean [name] MRN: [id number] Date of Birth: 1946-10-22   Medication/Dose: DOFETILIDE  500 MCG BID  Covered?: Yes  Tier: 3 Drug  Prescription Coverage [facility]  Spoke with Person/Company/Phone Number:: GREUX   @  [place] [facility] # [phone]  Co-Pay: $ 11.77  Prior Approval: No  Deductible: Met (OUT-OF-POCKET:MET)  Additional Notes: TIKOSYN 500 MCG BID  : NOT COVER / NON-FORMULARY   P/A-YES # [phone]    Memory Argue Phone Number: 06/16/2021, 1:24 PM

## 2021-06-16 NOTE — Progress Notes (Signed)
  Paged for pauses/brady noted on telemetry.   Pt having brief (2 - 2.3 second) pauses in AF on tele. Asymptomatic.   No change to current regimen.   Please make sure not to hold tikosyn as it will not affect HR.   Can hold coreg if pt symptomatic, otherwise would continue and re-assess once he's in sinus.   Legrand Como [address]" [place], PA-C  06/16/2021 3:10 PM

## 2021-06-16 NOTE — Progress Notes (Signed)
Pharmacy: Dofetilide (Tikosyn) - Initial Consult Assessment and Electrolyte Replacement  Pharmacy consulted to assist in monitoring and replacing electrolytes in this 75 y.o. male admitted on 06/16/2021 undergoing dofetilide initiation. First dofetilide dose: 06/17/19  Assessment:  Patient Exclusion Criteria: If any screening criteria checked as "Yes", then  patient  should NOT receive dofetilide until criteria item is corrected.  If "Yes" please indicate correction plan.  YES  NO Patient  Exclusion Criteria Correction Plan   []   [x]   Baseline QTc interval is greater than or equal to 440 msec. IF above YES box checked dofetilide contraindicated unless patient has ICD; then may proceed if QTc 500-550 msec or with known ventricular conduction abnormalities may proceed with QTc 550-600 msec. QTc =  442ms    []   [x]   Patient is known or suspected to have a digoxin level greater than 2 ng/ml: No results found for: DIGOXIN     []   [x]   Creatinine clearance less than 20 ml/min (calculated using Cockcroft-Gault, actual body weight and serum creatinine): Estimated Creatinine Clearance: 52.7 mL/min (A) (by C-G formula based on SCr of 1.27 mg/dL (H)).     []   [x]  Patient has received drugs known to prolong the QT intervals within the last 48 hours (phenothiazines, tricyclics or tetracyclic antidepressants, erythromycin, H-1 antihistamines, cisapride, fluoroquinolones, azithromycin, ondansetron).   Updated information on QT prolonging agents is available to be searched on the following database:QT prolonging agents     []   [x]   Patient received a dose of hydrochlorothiazide (Oretic) alone or in any combination including triamterene (Dyazide, Maxzide) in the last 48 hours.    []   [x]  Patient received a medication known to increase dofetilide plasma concentrations prior to initial dofetilide dose:  Trimethoprim (Primsol, Proloprim) in the last 36 hours Verapamil (Calan, Verelan) in the last  36 hours or a sustained release dose in the last 72 hours Megestrol (Megace) in the last 5 days  Cimetidine (Tagamet) in the last 6 hours Ketoconazole (Nizoral) in the last 24 hours Itraconazole (Sporanox) in the last 48 hours  Prochlorperazine (Compazine) in the last 36 hours     []   [x]   Patient is known to have a history of torsades de pointes; congenital or acquired long QT syndromes.    []   [x]   Patient has received a Class 1 antiarrhythmic with less than 2 half-lives since last dose. (Disopyramide, Quinidine, Procainamide, Lidocaine, Mexiletine, Flecainide, Propafenone)    []   [x]   Patient has received amiodarone therapy in the past 3 months or amiodarone level is greater than 0.3 ng/ml.    Patient has been appropriately anticoagulated with apixaban.  Labs:    Component Value Date/Time   K 4.3 06/16/2021 1053   MG 2.0 06/16/2021 1053     Plan: Potassium: K >/= 4: Appropriate to initiate Tikosyn, no replacement needed    Magnesium: Mg 1.8-2: Give Mg 2 gm IV x1 to prevent Mg from dropping below 1.8 - do not need to recheck Mg. Appropriate to initiate Tikosyn   Thank you for allowing pharmacy to participate in this patient's care   Erin Hearing PharmD., BCPS Clinical Pharmacist 06/16/2021 11:56 AM

## 2021-06-16 NOTE — TOC Benefit Eligibility Note (Signed)
Patient Teacher, English as a foreign language completed.    The patient is currently admitted and upon discharge could be taking Tikkosyn (Dofetilide) 500 mcg.  The current 30 day co-pay is, $14.29.   The patient is insured through [employer], [place] Patient Advocate Specialist [place] Team Direct Number: [phone]  Fax: [phone]

## 2021-06-17 LAB — BASIC METABOLIC PANEL
Anion gap: 12 (ref 5–15)
BUN: 20 mg/dL (ref 8–23)
CO2: 24 mmol/L (ref 22–32)
Calcium: 9.2 mg/dL (ref 8.9–10.3)
Chloride: 102 mmol/L (ref 98–111)
Creatinine, Ser: 1.13 mg/dL (ref 0.61–1.24)
GFR, Estimated: >60 mL/min (ref >60)
Glucose, Bld: 91 mg/dL (ref 70–99)
Potassium: 4.1 mmol/L (ref 3.5–5.1)
Sodium: 138 mmol/L (ref 135–145)

## 2021-06-17 LAB — MAGNESIUM: Magnesium: 2.2 mg/dL (ref 1.7–2.4)

## 2021-06-17 NOTE — Progress Notes (Addendum)
Electrophysiology Rounding Note  Patient Name: Dean [name] Date of Encounter: 06/17/2021  Primary Cardiologist: Mertie Moores, MD  Electrophysiologist: Dr. Curt Bears   Subjective   Pt converted to sinus rhythm on Tikosyn 500 mcg BID   QTc from EKG last pm shows stable QTc at ~420  The patient is doing well today.  At this time, the patient denies chest pain, shortness of breath, or any new concerns.  Inpatient Medications    Scheduled Meds:  allopurinol  100 mg Oral Daily   apixaban  5 mg Oral BID   atorvastatin  10 mg Oral Daily   carvedilol  3.125 mg Oral BID WC   dofetilide  500 mcg Oral BID   ezetimibe  5 mg Oral Daily   sodium chloride flush  3 mL Intravenous Q12H   Continuous Infusions:  sodium chloride     PRN Meds: sodium chloride, sodium chloride flush   Vital Signs    Vitals:   06/16/21 1144 06/16/21 2146 06/17/21 0032 06/17/21 0458  BP: 139/74 132/81 128/76 117/66  Pulse: 70 61 (!) 51 (!) 50  Resp: 18 17 16 16   Temp:  97.8 F (36.6 C) 97.6 F (36.4 C) 97.6 F (36.4 C)  TempSrc: Oral Oral Oral Oral  SpO2: 94% 96% 100% 99%  Weight: 82.4 kg       Intake/Output Summary (Last 24 hours) at 06/17/2021 0726 Last data filed at 06/16/2021 1509 Gross per 24 hour  Intake 36.93 ml  Output --  Net 36.93 ml   Filed Weights   06/16/21 1144  Weight: 82.4 kg    Physical Exam    GEN- The patient is well appearing, alert and oriented x 3 today.   Head- normocephalic, atraumatic Eyes-  Sclera clear, conjunctiva pink Ears- hearing intact Oropharynx- clear Neck- supple Lungs- Clear to ausculation bilaterally, normal work of breathing Heart- Regular rate and rhythm, no murmurs, rubs or gallops GI- soft, NT, ND, + BS Extremities- no clubbing, cyanosis, or edema Skin- no rash or lesion Psych- euthymic mood, full affect Neuro- strength and sensation are intact  Labs    CBC No results for input(s): WBC, NEUTROABS, HGB, HCT, MCV, PLT in the last 72  hours. Basic Metabolic Panel Recent Labs    06/16/21 1053 06/17/21 0305  NA 139 138  K 4.3 4.1  CL 104 102  CO2 29 24  GLUCOSE 116* 91  BUN 20 20  CREATININE 1.27* 1.13  CALCIUM 9.5 9.2  MG 2.0 2.2    Potassium  Date/Time Value Ref Range Status  06/17/2021 03:05 AM 4.1 3.5 - 5.1 mmol/L Final   Magnesium  Date/Time Value Ref Range Status  06/17/2021 03:05 AM 2.2 1.7 - 2.4 mg/dL Final    Comment:    Performed at [facility], [place] [address], [place], [place] [address]    Telemetry    Sinus brady NSR 50-60s after first dose of tikosyn. (personally reviewed)  Radiology    No results found.   Patient Profile     Dean [name] is a 75 y.o. male with a past medical history significant for persistent atrial fibrillation.  They were admitted for tikosyn load.   Assessment & Plan    Persistent atrial fibrillation Pt converted to sinus rhythm on Tikosyn 500 mcg BID  Continue Eliquis Electrolytes stable.  CHA2DS2/VAS Stroke Risk Points  Current as of 31 minutes ago    2 >= 2 Points: High Risk 1 - 1.99 Points: Medium Risk 0 Points:  Low Risk   Last Change: N/A    Details   This score determines the patient's risk of having a stroke if the patient has atrial fibrillation.    Points Metrics 0 Has Congestive Heart Failure:  No   Current as of 31 minutes ago 1 Has Vascular Disease:  Yes   Current as of 31 minutes ago 0 Has Hypertension:  No   Current as of 31 minutes ago 1 Age:  75   Current as of 31 minutes ago 0 Has Diabetes:  No   Current as of 31 minutes ago 0 Had Stroke:  No  Had TIA:  No  Had Thromboembolism:  No   Current as of 31 minutes ago 0 Male:  No   Current as of 31 minutes ago       2. Bradycardia Follow closely on coreg  He has converted to NSR/sinus.  Dean [name] not likely require [facility].  For questions or updates, please contact [place] Please consult [email or web address] for contact info under Cardiology/STEMI.  Signed, Shirley Friar, PA-C   06/17/2021, 7:26 AM   I have seen and examined this patient with Dean [name].  Agree with above, note added to reflect my findings.  On exam, RRR, no murmurs.  Patient compared to sinus rhythm after first dose of dofetilide.  QTC is remained stable.  We Dean [name] continue with current dose.  Dean [name] M. Yasmyn Bellisario MD 06/17/2021 11:48 AM

## 2021-06-17 NOTE — Progress Notes (Signed)
Morning EKG reviewed  Shows has converted to NSR at 53 bpm with stable QTc at ~400 ms.  Continue Tikosyn 500 mcg BID.   Pt will not require DCCV. Continue admission at least through Friday for observation of QTc.   Shirley Friar, PA-C  Pager: [phone]  06/17/2021 11:11 AM

## 2021-06-17 NOTE — Progress Notes (Signed)
Referral received for Tikosyn needs, benefits check completed with copay showing $11.77. CM spoke with pt at bedside to discuss transition needs for Tikosyn. Per conversation with patient- he has decided he wants to use in house [facility] pharmacy for his first months fill on discharge. Then will use his mail order [facility] for subsequent refill needs. Pt will need 30 day script sent to [facility] for d/c, and a 90 day script sent to his [facility] mail order pharmacy.

## 2021-06-17 NOTE — Progress Notes (Signed)
.  Pharmacy: Dofetilide (Tikosyn) - Follow Up Assessment and Electrolyte Replacement  Pharmacy consulted to assist in monitoring and replacing electrolytes in this 75 y.o. male admitted on 06/16/2021 undergoing dofetilide initiation. First dofetilide dose: 06/16/21.  Labs:    Component Value Date/Time   K 4.1 06/17/2021 0305   MG 2.2 06/17/2021 0305     Plan: Potassium: K >/= 4: No additional supplementation needed  Magnesium: Mg > 2: No additional supplementation needed   Thank you for allowing pharmacy to participate in this patient's care   Erin Hearing PharmD., BCPS Clinical Pharmacist 06/17/2021 7:48 AM

## 2021-06-18 LAB — MAGNESIUM: Magnesium: 2.1 mg/dL (ref 1.7–2.4)

## 2021-06-18 LAB — BASIC METABOLIC PANEL
Anion gap: 6 (ref 5–15)
BUN: 21 mg/dL (ref 8–23)
CO2: 29 mmol/L (ref 22–32)
Calcium: 9.1 mg/dL (ref 8.9–10.3)
Chloride: 102 mmol/L (ref 98–111)
Creatinine, Ser: 1.23 mg/dL (ref 0.61–1.24)
GFR, Estimated: >60 mL/min (ref >60)
Glucose, Bld: 105 mg/dL — ABNORMAL HIGH (ref 70–99)
Potassium: 4 mmol/L (ref 3.5–5.1)
Sodium: 137 mmol/L (ref 135–145)

## 2021-06-18 NOTE — Progress Notes (Signed)
Morning EKG reviewed  Shows remains in NSR at 53 bpm with stable QTc at 420-430 ms.  Continue Tikosyn 500 mcg BID.   Pt will not require DCCV. Plan for home tomorrow if QTc remains stable.   Shirley Friar, PA-C  Pager: [phone]  06/18/2021 10:58 AM

## 2021-06-18 NOTE — Progress Notes (Signed)
.  Pharmacy: Dofetilide (Tikosyn) - Follow Up Assessment and Electrolyte Replacement  Pharmacy consulted to assist in monitoring and replacing electrolytes in this 75 y.o. male admitted on 06/16/2021 undergoing dofetilide initiation. First dofetilide dose: 06/16/21.  Labs:    Component Value Date/Time   K 4.0 06/18/2021 0205   MG 2.1 06/18/2021 0205     Plan: Potassium: K >/= 4: No additional supplementation needed  Magnesium: Mg > 2: No additional supplementation needed  Patient aware of copay and initial fill will be at [facility].   Thank you for allowing pharmacy to participate in this patient's care   Erin Hearing PharmD., BCPS Clinical Pharmacist 06/18/2021 12:52 PM

## 2021-06-18 NOTE — Progress Notes (Addendum)
Electrophysiology Rounding Note  Patient Name: Dean [name] Date of Encounter: 06/18/2021  Primary Cardiologist: Mertie Moores, MD  Electrophysiologist: Dr. Curt Bears   Subjective   Pt  remains in sinus  on Tikosyn 500 mcg BID   QTc from EKG last pm shows stable QTc at ~460 ms  The patient is doing well today.  At this time, the patient denies chest pain, shortness of breath, or any new concerns.  Inpatient Medications    Scheduled Meds:  allopurinol  100 mg Oral Daily   apixaban  5 mg Oral BID   atorvastatin  10 mg Oral Daily   carvedilol  3.125 mg Oral BID WC   dofetilide  500 mcg Oral BID   ezetimibe  5 mg Oral Daily   sodium chloride flush  3 mL Intravenous Q12H   Continuous Infusions:  sodium chloride     PRN Meds: sodium chloride, sodium chloride flush   Vital Signs    Vitals:   06/17/21 1135 06/17/21 2000 06/17/21 2052 06/18/21 0339  BP: 126/74  132/73 126/60  Pulse: (!) 55  (!) 52 (!) 54  Resp:   17 17  Temp: 97.6 F (36.4 C)  (!) 97.5 F (36.4 C) 97.7 F (36.5 C)  TempSrc: Oral  Oral Oral  SpO2: 98%  98% 99%  Weight:      Height:  5' 9.5" (1.765 m)      Intake/Output Summary (Last 24 hours) at 06/18/2021 0656 Last data filed at 06/17/2021 2000 Gross per 24 hour  Intake 960 ml  Output --  Net 960 ml   Filed Weights   06/16/21 1144  Weight: 82.4 kg    Physical Exam    GEN- The patient is well appearing, alert and oriented x 3 today.   Head- normocephalic, atraumatic Eyes-  Sclera clear, conjunctiva pink Ears- hearing intact Oropharynx- clear Neck- supple Lungs- Clear to ausculation bilaterally, normal work of breathing Heart-  Slow but regular  rate and rhythm, no murmurs, rubs or gallops GI- soft, NT, ND, + BS Extremities- no clubbing, cyanosis, or edema Skin- no rash or lesion Psych- euthymic mood, full affect Neuro- strength and sensation are intact  Labs    CBC No results for input(s): WBC, NEUTROABS, HGB, HCT, MCV, PLT in  the last 72 hours. Basic Metabolic Panel Recent Labs    06/17/21 0305 06/18/21 0205  NA 138 137  K 4.1 4.0  CL 102 102  CO2 24 29  GLUCOSE 91 105*  BUN 20 21  CREATININE 1.13 1.23  CALCIUM 9.2 9.1  MG 2.2 2.1    Potassium  Date/Time Value Ref Range Status  06/18/2021 02:05 AM 4.0 3.5 - 5.1 mmol/L Final   Magnesium  Date/Time Value Ref Range Status  06/18/2021 02:05 AM 2.1 1.7 - 2.4 mg/dL Final    Comment:    Performed at [facility], [place] [address], [place], [place] [address]    Telemetry    Sinus brady 40-50s (personally reviewed)  Radiology    No results found.   Patient Profile     Dean [name] is a 75 y.o. male with a past medical history significant for persistent atrial fibrillation.  They were admitted for tikosyn load.   Assessment & Plan    Persistent atrial fibrillation Pt  remains in sinus bradycardia  on Tikosyn 500 mcg BID  Continue Eliquis Electrolytes stable.  CHA2DS2VASC is at least 2   2. Bradycardia, sinus Asymptomatic for now on coreg 3.125  mg BID  Stable on current dosing. Home tomorrow if QTc remains stable.    For questions or updates, please contact [place] Please consult [email or web address] for contact info under Cardiology/STEMI.  Signed, Shirley Friar, PA-C  06/18/2021, 6:56 AM   I have seen and examined this patient with Oda Kilts.  Agree with above, note added to reflect my findings.  On exam, RRR, no murmurs. Remains in sinus rhythm QTc stable. Continue tikosyn at current dose.    Elliett Guarisco M. Enda Santo MD 06/19/2021 7:07 AM

## 2021-06-18 NOTE — Progress Notes (Signed)
Hr sustaining 30's-low 40's. Pt is asymptomatic. MD notified

## 2021-06-19 ENCOUNTER — Other Ambulatory Visit (HOSPITAL_COMMUNITY): Payer: Self-pay

## 2021-06-19 LAB — BASIC METABOLIC PANEL
Anion gap: 7 (ref 5–15)
BUN: 22 mg/dL (ref 8–23)
CO2: 26 mmol/L (ref 22–32)
Calcium: 9 mg/dL (ref 8.9–10.3)
Chloride: 105 mmol/L (ref 98–111)
Creatinine, Ser: 1.16 mg/dL (ref 0.61–1.24)
GFR, Estimated: >60 mL/min (ref >60)
Glucose, Bld: 98 mg/dL (ref 70–99)
Potassium: 4.4 mmol/L (ref 3.5–5.1)
Sodium: 138 mmol/L (ref 135–145)

## 2021-06-19 LAB — MAGNESIUM: Magnesium: 2.2 mg/dL (ref 1.7–2.4)

## 2021-06-19 MED ORDER — DOFETILIDE 500 MCG PO CAPS
500.0000 ug | ORAL_CAPSULE | Freq: Two times a day (BID) | ORAL | 6 refills | Status: DC
  Filled 2021-06-19: qty 60, 30d supply, fill #0

## 2021-06-19 NOTE — Progress Notes (Signed)
Hr sustaining in 30's dropping to 20's nonsustained. MD notified

## 2021-06-19 NOTE — Care Management Important Message (Signed)
Important Message  Patient Details  Name: Dean [name] MRN: [id number] Date of Birth: [birth year]   Medicare Important Message Given:  Yes     [name] 06/19/2021, 9:28 AM

## 2021-06-19 NOTE — Progress Notes (Signed)
06/18/2021 evening EKG reviewed  Shows  remains in sinus bradycardia  at 40 bpm with ? Blocked PACs stable QTc at 450 ms when accounted for rate..  Continue Tikosyn 500 mcg BID.   QTc remains stable but rates quite slow. Stopping coreg. Follow HRs this am. Tentatively had planned home this after if QTc remains stable.  Will review with MD and full note to follow pending disposition.     Shirley Friar, PA-C  Pager: [phone]  06/19/2021 6:49 AM

## 2021-06-19 NOTE — Discharge Summary (Addendum)
ELECTROPHYSIOLOGY PROCEDURE DISCHARGE SUMMARY    Patient ID: Dean [name],  MRN: [id number], DOB/AGE: [birth year] 73 y.o.  Admit date: 06/16/2021 Discharge date: 06/19/2021  Primary Care Physician: Mackie Pai, PA-C  Primary Cardiologist: Mertie Moores, MD  Electrophysiologist: Dr. Curt Bears  Primary Discharge Diagnosis:  1.  Persistent atrial fibrillation status post Tikosyn loading this admission  Secondary Discharge Diagnosis:  2. Sinus bradycardia  Allergies  Allergen Reactions   Crestor [Rosuvastatin Calcium]     Reported intolerance to Lipitor in the past Crestor 40mg  patient reported muscle aches, dose reduced to rechallenge     Procedures This Admission:  1.  Tikosyn loading  Brief HPI: Dean [name] is a 75 y.o. male with a past medical history as noted above.  They were referred to EP in the outpatient setting for treatment options of atrial fibrillation.  Risks, benefits, and alternatives to Tikosyn were reviewed with the patient who wished to proceed.    Hospital Course:  The patient was admitted and Tikosyn was initiated.  Renal function and electrolytes were followed during the hospitalization.  Their QTc remained stable.  He converted to sinus bradycardia after the first dose and did not require cardioversion. They were monitored until discharge on telemetry which demonstrated sinus bradycardia and blocked PACs. Coreg was stopped.  On the day of discharge, they were examined by Dr. Curt Bears  who considered them stable for discharge to home.  Follow-up has been arranged with the Atrial Fibrillation clinic in approximately 1 week and with Dr. Curt Bears  in 4 weeks.   Physical Exam: Vitals:   06/17/21 2052 06/18/21 0339 06/18/21 1440 06/18/21 2011  BP: 132/73 126/60 125/69 124/74  Pulse: (!) 52 (!) 54 (!) 48   Resp: 17 17 18 19   Temp: (!) 97.5 F (36.4 C) 97.7 F (36.5 C) 97.6 F (36.4 C) 97.7 F (36.5 C)  TempSrc: Oral Oral Oral Oral  SpO2: 98% 99%  99% 100%  Weight:      Height:        GEN- The patient is well appearing, alert and oriented x 3 today.   HEENT: normocephalic, atraumatic; sclera clear, conjunctiva pink; hearing intact; oropharynx clear; neck supple, no JVP Lymph- no cervical lymphadenopathy Lungs- Clear to ausculation bilaterally, normal work of breathing.  No wheezes, rales, rhonchi Heart- Regular but slow rate and rhythm, no murmurs, rubs or gallops, PMI not laterally displaced GI- soft, non-tender, non-distended, bowel sounds present, no hepatosplenomegaly Extremities- no clubbing, cyanosis, or edema; DP/PT/radial pulses 2+ bilaterally MS- no significant deformity or atrophy Skin- warm and dry, no rash or lesion Psych- euthymic mood, full affect Neuro- strength and sensation are intact   Labs:   Lab Results  Component Value Date   WBC 4.3 04/23/2020   HGB 13.2 04/23/2020   HCT 39.7 04/23/2020   MCV 98 (H) 04/23/2020   PLT 163 04/23/2020    Recent Labs  Lab 06/19/21 0228  NA 138  K 4.4  CL 105  CO2 26  BUN 22  CREATININE 1.16  CALCIUM 9.0  GLUCOSE 98     Discharge Medications:  Allergies as of 06/19/2021       Reactions   Crestor [rosuvastatin Calcium]    Reported intolerance to Lipitor in the past Crestor 40mg  patient reported muscle aches, dose reduced to rechallenge        Medication List     STOP taking these medications    carvedilol 3.125 MG tablet Commonly known as: COREG  TAKE these medications    allopurinol 100 MG tablet Commonly known as: ZYLOPRIM Take 100 mg by mouth daily.   apixaban 5 MG Tabs tablet Commonly known as: Eliquis Take 1 tablet (5 mg total) by mouth 2 (two) times daily.   atorvastatin 10 MG tablet Commonly known as: LIPITOR TAKE 1 TABLET BY MOUTH  DAILY What changed:  how much to take how to take this when to take this   dofetilide 500 MCG capsule Commonly known as: TIKOSYN Take 1 capsule (500 mcg total) by mouth 2 (two) times  daily.   ezetimibe 10 MG tablet Commonly known as: ZETIA TAKE ONE-HALF TABLET BY  MOUTH DAILY   hydrocortisone cream 1 % Apply 1 application topically as needed for itching. Do NOT apply to sternal wound What changed: when to take this        Disposition:    Follow-up Information     Fenton, Clint R, PA Follow up.   Specialty: Cardiology Why: on 7/22 at 1030 am for post hospital [facility] information: [place] [address] [phone]                 Duration of Discharge Encounter: Greater than 30 minutes including physician time.  Signed, Shirley Friar, PA-C  06/19/2021 10:41 AM    I have seen and examined this patient with Oda Kilts.  Agree with above, note added to reflect my findings.  On exam, RRR, no murmurs.  Patient admitted for dofetilide load.  Converted to sinus rhythm after the first dose.  Plan for discharge today with follow-up in clinic.  Dean Rubey M. Shahid Flori MD 06/19/2021 3:23 PM

## 2021-06-19 NOTE — Progress Notes (Signed)
Pharmacy: Dofetilide (Tikosyn) - Follow Up Assessment and Electrolyte Replacement  Pharmacy consulted to assist in monitoring and replacing electrolytes in this 75 y.o. male admitted on 06/16/2021 undergoing dofetilide initiation.  Labs:    Component Value Date/Time   K 4.4 06/19/2021 0228   MG 2.2 06/19/2021 0228     Plan: Potassium: K >/= 4: No additional supplementation needed  Magnesium: Mg > 2: No additional supplementation needed   Thank you for allowing pharmacy to participate in this patient's care   Hildred Laser, PharmD Clinical Pharmacist **Pharmacist phone directory can now be found on [email or web address] ([email or web address]).  Listed under [place].

## 2021-06-26 ENCOUNTER — Other Ambulatory Visit: Payer: Self-pay

## 2021-06-26 ENCOUNTER — Encounter (HOSPITAL_COMMUNITY): Payer: Self-pay | Admitting: Physician Assistant

## 2021-06-26 ENCOUNTER — Other Ambulatory Visit (HOSPITAL_COMMUNITY): Payer: Self-pay | Admitting: *Deleted

## 2021-06-26 ENCOUNTER — Ambulatory Visit (HOSPITAL_COMMUNITY)
Admit: 2021-06-26 | Discharge: 2021-06-26 | Disposition: A | Discharge status: Home / Self Care | Payer: Medicare Other | Source: Ambulatory Visit | Attending: Physician Assistant | Admitting: Physician Assistant

## 2021-06-26 VITALS — BP 142/78 | HR 57 | Ht 69.5 in | Wt 182.8 lb

## 2021-06-26 DIAGNOSIS — I509 Heart failure, unspecified: Secondary | ICD-10-CM | POA: Diagnosis not present

## 2021-06-26 DIAGNOSIS — I2510 Atherosclerotic heart disease of native coronary artery without angina pectoris: Secondary | ICD-10-CM | POA: Insufficient documentation

## 2021-06-26 DIAGNOSIS — Z87891 Personal history of nicotine dependence: Secondary | ICD-10-CM | POA: Insufficient documentation

## 2021-06-26 DIAGNOSIS — Z951 Presence of aortocoronary bypass graft: Secondary | ICD-10-CM | POA: Insufficient documentation

## 2021-06-26 DIAGNOSIS — Z7901 Long term (current) use of anticoagulants: Secondary | ICD-10-CM | POA: Insufficient documentation

## 2021-06-26 DIAGNOSIS — I4819 Other persistent atrial fibrillation: Secondary | ICD-10-CM | POA: Diagnosis not present

## 2021-06-26 DIAGNOSIS — D6869 Other thrombophilia: Secondary | ICD-10-CM

## 2021-06-26 DIAGNOSIS — Z79899 Other long term (current) drug therapy: Secondary | ICD-10-CM | POA: Insufficient documentation

## 2021-06-26 LAB — BASIC METABOLIC PANEL
Anion gap: 6 (ref 5–15)
BUN: 19 mg/dL (ref 8–23)
CO2: 25 mmol/L (ref 22–32)
Calcium: 9.3 mg/dL (ref 8.9–10.3)
Chloride: 106 mmol/L (ref 98–111)
Creatinine, Ser: 1.22 mg/dL (ref 0.61–1.24)
GFR, Estimated: >60 mL/min (ref >60)
Glucose, Bld: 114 mg/dL — ABNORMAL HIGH (ref 70–99)
Potassium: 4.2 mmol/L (ref 3.5–5.1)
Sodium: 137 mmol/L (ref 135–145)

## 2021-06-26 LAB — MAGNESIUM: Magnesium: 1.8 mg/dL (ref 1.7–2.4)

## 2021-06-26 MED ORDER — MAGNESIUM OXIDE 400 MG PO TABS: 400.0000 mg | ORAL_TABLET | Freq: Every day | ORAL | 1 refills | Status: DC

## 2021-06-26 MED ORDER — DOFETILIDE 500 MCG PO CAPS: 500.0000 ug | ORAL_CAPSULE | Freq: Two times a day (BID) | ORAL | 2 refills | Status: DC

## 2021-06-26 NOTE — Progress Notes (Signed)
Primary Care Physician: Elise Benne Referring Physician: Dr. Acie Fredrickson Primary EP: Dr Curt Bears    Dean [name] is a 75 y.o. male with a h/o CAD, diverticulosis s/p colectomy,  s/p bypass, CHF, PAF, moderate MR, that is in the afib clinic per Dr. Acie Fredrickson to discuss options for persistent afib to restore SR. He is currently on Eliquis 5 mg bid for a CHA2DS2VASc  score of at least 3. Pt on carvedilol 3.125 mg bid for rate control. Ekg shows afib with v rate of 70 bpm.   F/u 06/16/21. Pt is here for tikosyn admit. No benadryl use. PharmD did not see any drugs that were contraindicated with Tikosyn. No benadryl use. He reminds in afib rate controlled, qt is acceptable.   Pt denies tobacco use, minimal use of alcohol, some snoring but denies daytime somnolence. He has minimal symptoms from afib and is rate controlled. He feels that his afib has been persistent for  at least 6 months.   Follow up in the AF clinic 06/26/21. Patient is s/p dofetilide admission 7/12-7/15/22. He converted with the medication and did not require DCCV. Patient reports that he has done well since his hospitalization. He has a Chiropodist which has shown only SR.    Today, he denies symptoms of palpitations, chest pain, shortness of breath, orthopnea, PND, lower extremity edema, dizziness, presyncope, syncope, or neurologic sequela. The patient is tolerating medications without difficulties and is otherwise without complaint today.   Past Medical History:  Diagnosis Date   Acid reflux    history of   Anemia    Atrial fibrillation ([place]) 10/2017   RVR   Coronary artery disease    Diverticulitis 11/26/2017   Gout    Hepatitis A age 55   History of kidney stones    Hyperlipemia    Hypogonadism male    Irregular heart beat    Ischemic cardiomyopathy    Non compliance with medical treatment    Non-STEMI (non-ST elevated myocardial infarction) ([place]) 10/2017   Pelvic abscess in male [facility])    percutaneous drain  placement   Past Surgical History:  Procedure Laterality Date   BLADDER REPAIR  02/17/2018   Procedure: BLADDER REPAIR;  Surgeon: Ileana Roup, MD;  Location: [facility];  Service: General;;   BLADDER REPAIR  02/17/2018   Procedure: pelvic exploration, assist with bladder repair ;  Surgeon: Raynelle Bring, MD;  Location: [facility];  Service: Urology;;   COLONOSCOPY  ~ 2014   Dr Ferdinand Lango in [facility].  Diverticulosis.  pt denies colon polyps.     CORONARY ARTERY BYPASS GRAFT N/A 10/26/2017   Procedure: CORONARY ARTERY BYPASS GRAFTING (CABG) x four , using left internal mammary artery and bilateral legs greater saphenous vein harvested endoscopically;  Surgeon: Gaye Pollack, MD;  Location: [facility] OR;  Service: Open Heart Surgery;  Laterality: N/A;   CYSTOSCOPY W/ URETERAL STENT PLACEMENT Bilateral 02/17/2018   Procedure: CYSTOSCOPY WITH RETROGRADE PYELOGRAM/URETERAL STENT PLACEMENT;  Surgeon: Ardis Hughs, MD;  Location: [facility];  Service: Urology;  Laterality: Bilateral;   IR CATHETER TUBE CHANGE  01/27/2018   IR RADIOLOGIST EVAL & MGMT  12/21/2017   LAPAROSCOPIC SIGMOID COLECTOMY N/A 02/17/2018   Procedure: LAPAROSCOPIC SIGMOID COLECTOMY  ERAS PATHWAY TAKEDOWN OF COLOVESSICAL COLOCUTANEOUS FISTULA;  Surgeon: Ileana Roup, MD;  Location: [facility];  Service: General;  Laterality: N/A;   LEFT HEART CATH AND CORONARY ANGIOGRAPHY N/A 10/25/2017   Procedure: LEFT HEART CATH AND CORONARY ANGIOGRAPHY;  Surgeon: Leonie Man, MD;  Location: [facility];  Service: Cardiovascular;  Laterality: N/A;   SIGMOIDOSCOPY N/A 02/17/2018   Procedure: FLEX SIGMOIDOSCOPY;  Surgeon: Ileana Roup, MD;  Location: [facility];  Service: General;  Laterality: N/A;   TEE WITHOUT CARDIOVERSION N/A 10/26/2017   Procedure: TRANSESOPHAGEAL ECHOCARDIOGRAM (TEE);  Surgeon: Gaye Pollack, MD;  Location: [facility];  Service: Open Heart Surgery;  Laterality: N/A;    Current Outpatient Medications  Medication  Sig Dispense Refill   allopurinol (ZYLOPRIM) 100 MG tablet Take 100 mg by mouth daily.      apixaban (ELIQUIS) 5 MG TABS tablet Take 1 tablet (5 mg total) by mouth 2 (two) times daily. 180 tablet 1   atorvastatin (LIPITOR) 10 MG tablet TAKE 1 TABLET BY MOUTH  DAILY 90 tablet 2   ezetimibe (ZETIA) 10 MG tablet TAKE ONE-HALF TABLET BY  MOUTH DAILY 45 tablet 1   dofetilide (TIKOSYN) 500 MCG capsule Take 1 capsule (500 mcg total) by mouth 2 (two) times daily. 180 capsule 2   No current facility-administered medications for this encounter.    Allergies  Allergen Reactions   Crestor [Rosuvastatin Calcium]     Reported intolerance to Lipitor in the past Crestor '40mg'$  patient reported muscle aches, dose reduced to rechallenge    Social History   Socioeconomic History   Marital status: Single    Spouse name: Not on file   Number of children: Not on file   Years of education: Not on file   Highest education level: Not on file  Occupational History   Occupation: retired  Tobacco Use   Smoking status: Former   Smokeless tobacco: Never  Scientific laboratory technician Use: Never used  Substance and Sexual Activity   Alcohol use: Yes    Alcohol/week: 0.0 standard drinks    Comment: once monthly   Drug use: No   Sexual activity: Not on file  Other Topics Concern   Not on file  Social History Narrative   Not on file   Social Determinants of Health   Financial Resource Strain: Not on file  Food Insecurity: Not on file  Transportation Needs: Not on file  Physical Activity: Not on file  Stress: Not on file  Social Connections: Not on file  Intimate Partner Violence: Not on file    Family History  Problem Relation Age of Onset   Hyperlipidemia Mother    Alzheimer's disease Mother    Hyperlipidemia Brother    Alzheimer's disease Maternal Grandmother    Colon cancer Neg Hx    Esophageal cancer Neg Hx    Stomach cancer Neg Hx     ROS- All systems are reviewed and negative except as per  the HPI above  Physical Exam: Vitals:   06/26/21 1025  BP: (!) 142/78  Pulse: (!) 57  Weight: 82.9 kg  Height: 5' 9.5" (1.765 m)    Wt Readings from Last 3 Encounters:  06/26/21 82.9 kg  06/16/21 82.4 kg  06/16/21 82.7 kg    Labs: Lab Results  Component Value Date   NA 138 06/19/2021   K 4.4 06/19/2021   CL 105 06/19/2021   CO2 26 06/19/2021   GLUCOSE 98 06/19/2021   BUN 22 06/19/2021   CREATININE 1.16 06/19/2021   CALCIUM 9.0 06/19/2021   PHOS 4.0 02/22/2018   MG 2.2 06/19/2021   Lab Results  Component Value Date   INR 1.41 02/18/2018   Lab Results  Component Value  Date   CHOL 170 10/21/2020   HDL 48 10/21/2020   LDLCALC 92 10/21/2020   TRIG 174 (H) 10/21/2020    GEN- The patient is a well appearing male, alert and oriented x 3 today.   HEENT-head normocephalic, atraumatic, sclera clear, conjunctiva pink, hearing intact, trachea midline. Lungs- Clear to ausculation bilaterally, normal work of breathing Heart- Regular rate and rhythm, no murmurs, rubs or gallops  GI- soft, NT, ND, + BS Extremities- no clubbing, cyanosis, or edema MS- no significant deformity or atrophy Skin- no rash or lesion Psych- euthymic mood, full affect Neuro- strength and sensation are intact   EKG-SB, HR 57, IVCD Vent. rate 57 BPM PR interval 216 ms QRS duration 126 ms QT/QTcB 454/441 ms  Echo-  1. Improvement of the LVEF noted as compared to echo from 2018. Segmental  wall motion involving lateral and inferior wall documented on echo from  2018 not seen on this study. Left ventricular ejection fraction, by  estimation, is 55 to 60%. The left  ventricle has normal function. The left ventricle has no regional wall  motion abnormalities. There is mild left ventricular hypertrophy. Left  ventricular diastolic parameters were normal.   2. Right ventricular systolic function is normal. The right ventricular  size is normal. There is normal pulmonary artery systolic pressure.    3. The mitral valve is normal in structure. Moderate mitral valve  regurgitation. No evidence of mitral stenosis.   4. The aortic valve is normal in structure. Aortic valve regurgitation is  trivial. No aortic stenosis is present.   5. The inferior vena cava is normal in size with greater than 50%  respiratory variability, suggesting right atrial pressure of 3 mmHg.     Assessment and Plan:  1. Persistent atrial fibrillation  S/p dofetilide admission 7/12-7/15/22. Patient appears to be maintaining SR. Continue dofetilide 500 mcg BID. QT stable. Check bmet/mag Continue Eliquis 5 mg BID  2. CHA2DS2VASc  score of  at least 3 Continue eliquis 5 mg bid   3. CAD S/p CABG No anginal symptoms.   Follow up with Dr Curt Bears and Dr Acie Fredrickson as scheduled.    [facility] [address] [place], [place] [address] [phone]

## 2021-07-30 ENCOUNTER — Other Ambulatory Visit: Payer: Self-pay

## 2021-07-30 ENCOUNTER — Encounter: Payer: Self-pay | Admitting: Cardiology

## 2021-07-30 ENCOUNTER — Ambulatory Visit: Payer: Medicare Other | Admitting: Cardiology

## 2021-07-30 VITALS — BP 150/86 | HR 57 | Ht 69.5 in | Wt 184.3 lb

## 2021-07-30 DIAGNOSIS — N179 Acute kidney failure, unspecified: Secondary | ICD-10-CM

## 2021-07-30 DIAGNOSIS — I4819 Other persistent atrial fibrillation: Secondary | ICD-10-CM | POA: Diagnosis not present

## 2021-07-30 NOTE — Addendum Note (Signed)
Addended by: Eulis Foster on: 07/30/2021 04:21 PM   Modules accepted: Orders

## 2021-07-30 NOTE — Patient Instructions (Addendum)
Medication Instructions:  NO CHANGES *If you need a refill on your cardiac medications before your next appointment, please call your pharmacy*   Lab Work: [place] ACID If you have labs (blood work) drawn today and your tests are completely normal, you will receive your results only by: [place] (if you have MyChart) OR A paper copy in the mail If you have any lab test that is abnormal or we need to change your treatment, we will call you to review the results.   Testing/Procedures: NONE   Follow-Up: At [facility], you and your health needs are our priority.  As part of our continuing mission to provide you with exceptional heart care, we have created designated Provider Care Teams.  These Care Teams include your primary Cardiologist (physician) and Advanced Practice Providers (APPs -  Physician Assistants and Nurse Practitioners) who all work together to provide you with the care you need, when you need it.  We recommend signing up for the patient portal called "MyChart".  Sign up information is provided on this After Visit Summary.  MyChart is used to connect with patients for Virtual Visits (Telemedicine).  Patients are able to view lab/test results, encounter notes, upcoming appointments, etc.  Non-urgent messages can be sent to your provider as well.   To learn more about what you can do with MyChart, go to [email or web address].    Your next appointment:   6 month(s)  The format for your next appointment:   In Person  Provider:  WITH AFIB CLINIC    Other Instructions NONE

## 2021-07-30 NOTE — Progress Notes (Signed)
Electrophysiology Office Note   Date:  07/30/2021   ID:  Dean [name], DOB 1946/04/04, MRN [id number]  PCP:  Mackie Pai, PA-C  Cardiologist:  N[facility] Primary Electrophysiologist:  Yalonda Sample Meredith Leeds, MD    Chief Complaint: AF   History of Present Illness: Dean [name] is a 75 y.o. male who is being seen today for the evaluation of AF at the request of Saguier, Percell Miller, [place]. Presenting today for electrophysiology evaluation.  He has a history significant for persistent atrial fibrillation, hyperlipidemia, coronary artery disease.  He was admitted to the hospital July 2022 and was loaded on dofetilide.   Today, he denies symptoms of palpitations, chest pain, shortness of breath, orthopnea, PND, lower extremity edema, claudication, dizziness, presyncope, syncope, bleeding, or neurologic sequela. The patient is tolerating medications without difficulties.  Since his dofetilide load he has felt well.  He has no chest pain or shortness of breath.  He has noted no further episodes of atrial fibrillation.  He has quite a bit more energy with less fatigue.   Past Medical History:  Diagnosis Date   Acid reflux    history of   Anemia    Atrial fibrillation ([place]) 10/2017   RVR   Coronary artery disease    Diverticulitis 11/26/2017   Gout    Hepatitis A age 9   History of kidney stones    Hyperlipemia    Hypogonadism male    Irregular heart beat    Ischemic cardiomyopathy    Non compliance with medical treatment    Non-STEMI (non-ST elevated myocardial infarction) ([place]) 10/2017   Pelvic abscess in male [facility])    percutaneous drain placement   Past Surgical History:  Procedure Laterality Date   BLADDER REPAIR  02/17/2018   Procedure: BLADDER REPAIR;  Surgeon: Ileana Roup, MD;  [facility];  Service: General;;   BLADDER REPAIR  02/17/2018   Procedure: pelvic exploration, assist with bladder repair ;  Surgeon: Raynelle Bring, MD;  [facility]: [facility];  Service:  Urology;;   COLONOSCOPY  ~ 2014   Dr Ferdinand Lango in [facility].  Diverticulosis.  pt denies colon polyps.     CORONARY ARTERY BYPASS GRAFT N/A 10/26/2017   Procedure: CORONARY ARTERY BYPASS GRAFTING (CABG) x four , using left internal mammary artery and bilateral legs greater saphenous vein harvested endoscopically;  Surgeon: Gaye Pollack, MD;  Location: [place] [facility];  Service: Open Heart Surgery;  Laterality: N/A;   CYSTOSCOPY W/ URETERAL STENT PLACEMENT Bilateral 02/17/2018   Procedure: CYSTOSCOPY WITH RETROGRADE PYELOGRAM/URETERAL STENT PLACEMENT;  Surgeon: Ardis Hughs, MD;  [facility];  Service: Urology;  Laterality: Bilateral;   IR CATHETER TUBE CHANGE  01/27/2018   IR RADIOLOGIST EVAL & MGMT  12/21/2017   LAPAROSCOPIC SIGMOID COLECTOMY N/A 02/17/2018   Procedure: LAPAROSCOPIC SIGMOID COLECTOMY  ERAS PATHWAY TAKEDOWN OF COLOVESSICAL COLOCUTANEOUS FISTULA;  Surgeon: Ileana Roup, MD;  [facility];  Service: General;  Laterality: N/A;   LEFT HEART CATH AND CORONARY ANGIOGRAPHY N/A 10/25/2017   Procedure: LEFT HEART CATH AND CORONARY ANGIOGRAPHY;  Surgeon: Leonie Man, MD;  Location: [place] [facility];  Service: Cardiovascular;  Laterality: N/A;   SIGMOIDOSCOPY N/A 02/17/2018   Procedure: FLEX SIGMOIDOSCOPY;  Surgeon: Ileana Roup, MD;  [facility];  Service: General;  Laterality: N/A;   TEE WITHOUT CARDIOVERSION N/A 10/26/2017   Procedure: TRANSESOPHAGEAL ECHOCARDIOGRAM (TEE);  Surgeon: Gaye Pollack, MD;  Location: [place];  Service: Open Heart Surgery;  Laterality:  N/A;     Current Outpatient Medications  Medication Sig Dispense Refill   allopurinol (ZYLOPRIM) 100 MG tablet Take 100 mg by mouth daily.      apixaban (ELIQUIS) 5 MG TABS tablet Take 1 tablet (5 mg total) by mouth 2 (two) times daily. 180 tablet 1   atorvastatin (LIPITOR) 10 MG tablet TAKE 1 TABLET BY MOUTH  DAILY 90 tablet 2   dofetilide (TIKOSYN) 500 MCG capsule Take 1 capsule (500 mcg  total) by mouth 2 (two) times daily. 180 capsule 2   ezetimibe (ZETIA) 10 MG tablet TAKE ONE-HALF TABLET BY  MOUTH DAILY 45 tablet 1   magnesium oxide (MAG-OX) 400 MG tablet Take 1 tablet (400 mg total) by mouth daily. 90 tablet 1   No current facility-administered medications for this visit.    Allergies:   Crestor [rosuvastatin calcium]   Social History:  The patient  reports that he has quit smoking. He has never used smokeless tobacco. He reports current alcohol use. He reports that he does not use drugs.   Family History:  The patient's family history includes Alzheimer's disease in his maternal grandmother and mother; Hyperlipidemia in his brother and mother.    ROS:  Please see the history of present illness.   Otherwise, review of systems is positive for none.   All other systems are reviewed and negative.    PHYSICAL EXAM: VS:  BP (!) 150/86   Pulse (!) 57   Ht 5' 9.5" (1.765 m)   Wt 184 lb 4.8 oz (83.6 kg)   SpO2 98%   BMI 26.83 kg/m  , BMI Body mass index is 26.83 kg/m. GEN: Well nourished, well developed, in no acute distress  HEENT: normal  Neck: no JVD, carotid bruits, or masses Cardiac: RRR; no murmurs, rubs, or gallops,no edema  Respiratory:  clear to auscultation bilaterally, normal work of breathing GI: soft, nontender, nondistended, + BS MS: no deformity or atrophy  Skin: warm and dry Neuro:  Strength and sensation are intact Psych: euthymic mood, full affect  EKG:  EKG is ordered today. Personal review of the ekg ordered shows sinus rhythm, rate 57 intermittent junctional beats, IVCD  Recent Labs: 10/21/2020: ALT 11 06/26/2021: BUN 19; Creatinine, Ser 1.22; Magnesium 1.8; Potassium 4.2; Sodium 137    Lipid Panel     Component Value Date/Time   CHOL 170 10/21/2020 1013   TRIG 174 (H) 10/21/2020 1013   HDL 48 10/21/2020 1013   CHOLHDL 3.5 10/21/2020 1013   CHOLHDL 3.3 10/25/2017 0341   VLDL 21 10/25/2017 0341   LDLCALC 92 10/21/2020 1013      Wt Readings from Last 3 Encounters:  07/30/21 184 lb 4.8 oz (83.6 kg)  06/26/21 182 lb 12.8 oz (82.9 kg)  06/16/21 181 lb 11.2 oz (82.4 kg)      Other studies Reviewed: Additional studies/ records that were reviewed today include: TTE 03/24/20  Review of the above records today demonstrates:   1. Improvement of the LVEF noted as compared to echo from 2018. Segmental  wall motion involving lateral and inferior wall documented on echo from  2018 not seen on this study. Left ventricular ejection fraction, by  estimation, is 55 to 60%. The left  ventricle has normal function. The left ventricle has no regional wall  motion abnormalities. There is mild left ventricular hypertrophy. Left  ventricular diastolic parameters were normal.   2. Right ventricular systolic function is normal. The right ventricular  size is normal. There is  normal pulmonary artery systolic pressure.   3. The mitral valve is normal in structure. Moderate mitral valve  regurgitation. No evidence of mitral stenosis.   4. The aortic valve is normal in structure. Aortic valve regurgitation is  trivial. No aortic stenosis is present.   5. The inferior vena cava is normal in size with greater than 50%  respiratory variability, suggesting right atrial pressure of 3 mmHg.    ASSESSMENT AND PLAN:  1.  Persistent atrial fibrillation: Status post dofetilide load.  Currently on Eliquis.  CHA2DS2-VASc of at least 4.  High risk medication monitoring.  Dofetilide labs as below.  He is remained in sinus rhythm and feels much improved.  We Saim Almanza continue with current management.  2.  Coronary artery disease: Status post CABG.  No current chest pain.  Current medicines are reviewed at length with the patient today.   The patient does not have concerns regarding his medicines.  The following changes were made today:  none  Labs/ tests ordered today include:  Orders Placed This Encounter  Procedures   Basic metabolic panel    Magnesium   Uric acid     Disposition:   FU with Maisen Klingler 6 months  Signed, Harlem Bula Meredith Leeds, MD  07/30/2021 4:19 PM     [address] [place] [address] [phone] (office) [phone] (fax)

## 2021-07-31 LAB — BASIC METABOLIC PANEL
BUN/Creatinine Ratio: 21 (ref 10–24)
BUN: 23 mg/dL (ref 8–27)
CO2: 28 mmol/L (ref 20–29)
Calcium: 9.3 mg/dL (ref 8.6–10.2)
Chloride: 102 mmol/L (ref 96–106)
Creatinine, Ser: 1.11 mg/dL (ref 0.76–1.27)
Glucose: 95 mg/dL (ref 65–99)
Potassium: 4.9 mmol/L (ref 3.5–5.2)
Sodium: 143 mmol/L (ref 134–144)
eGFR: 70 mL/min/{1.73_m2} (ref >59)

## 2021-07-31 LAB — MAGNESIUM: Magnesium: 2.2 mg/dL (ref 1.6–2.3)

## 2021-07-31 LAB — URIC ACID: Uric Acid: 6.4 mg/dL (ref 3.8–8.4)

## 2021-07-31 NOTE — Addendum Note (Signed)
Addended by: Glean Salen on: 07/31/2021 09:36 AM   Modules accepted: Orders

## 2021-09-03 ENCOUNTER — Other Ambulatory Visit: Payer: Self-pay | Admitting: Cardiovascular Disease

## 2021-09-24 ENCOUNTER — Telehealth: Payer: Self-pay | Admitting: Medical

## 2021-09-28 ENCOUNTER — Encounter: Payer: Self-pay | Admitting: Medical

## 2021-09-28 ENCOUNTER — Other Ambulatory Visit: Payer: Self-pay

## 2021-09-28 ENCOUNTER — Ambulatory Visit (HOSPITAL_BASED_OUTPATIENT_CLINIC_OR_DEPARTMENT_OTHER)
Admission: RE | Admit: 2021-09-28 | Discharge: 2021-09-28 | Disposition: A | Discharge status: Home / Self Care | Payer: Medicare Other | Source: Ambulatory Visit | Attending: Medical | Admitting: Medical

## 2021-09-28 ENCOUNTER — Ambulatory Visit (INDEPENDENT_AMBULATORY_CARE_PROVIDER_SITE_OTHER): Payer: Medicare Other | Admitting: Medical

## 2021-09-28 VITALS — BP 130/80 | HR 72 | Temp 98.2°F | Resp 18 | Ht 69.0 in | Wt 181.0 lb

## 2021-09-28 DIAGNOSIS — M25572 Pain in left ankle and joints of left foot: Secondary | ICD-10-CM

## 2021-09-28 DIAGNOSIS — G8929 Other chronic pain: Secondary | ICD-10-CM | POA: Insufficient documentation

## 2021-09-28 DIAGNOSIS — I10 Essential (primary) hypertension: Secondary | ICD-10-CM

## 2021-09-28 DIAGNOSIS — M79662 Pain in left lower leg: Secondary | ICD-10-CM | POA: Insufficient documentation

## 2021-09-28 DIAGNOSIS — M10079 Idiopathic gout, unspecified ankle and foot: Secondary | ICD-10-CM | POA: Diagnosis not present

## 2021-09-28 DIAGNOSIS — D126 Benign neoplasm of colon, unspecified: Secondary | ICD-10-CM

## 2021-09-28 DIAGNOSIS — M5440 Lumbago with sciatica, unspecified side: Secondary | ICD-10-CM | POA: Insufficient documentation

## 2021-09-28 DIAGNOSIS — L989 Disorder of the skin and subcutaneous tissue, unspecified: Secondary | ICD-10-CM

## 2021-09-28 NOTE — Patient Instructions (Addendum)
For low back pain will get lumbar xray. After review will give advise on possible referral.  For left ankle pain and left pretibial swelling will get xray and left lower extremity swelling.  See rheumatologist routinely on follow up.  For skn lesion concerns refer to dermatologist.  For elevated bp concern check bp daily and update me in one week. Today your bp is good.   For colon poyp placed gi md referral.  Follow up in one month or sooner if needed.

## 2021-09-28 NOTE — Progress Notes (Signed)
Subjective:    Patient ID: Dean [name], male    DOB: 05/16/1946, 75 y.o.   MRN: [id number]  HPI    Pt in for concern about his nose that seems to be peeling chronically. He states he wears sun block when he walks. But does walk daily.  Also pt is concerned about rt side of face slight thick area. Pt has beard presently.   Pt also mentions that he has some back pain. Pain states he has 7 weeks of lower back pain. Pt states back then pain was severe. He states could hardly get out of bed and he used heating pad. Still present but much less and transient.   Pt also wants to discuss his last colonoscopy. He had 10 inches removed from his intestines. He states his last colonoscopy was 7 years ago.   Last colonoscopy that I can seen was in 2014. On old report in 2019 by [place] states repeat in 5 years if polyp hyperplastic. He did have severe diverticulitis. In the past.  Hx of severe gout in the past. Pt sees rheumatologist. He see rheumatologist every 6 months.    Occasional left ankle pain. He thinks chronically mild swollen. But no trauma.   Left ankle. Mild swollen for years. Mild pain on palpation    Atrial fibrillation- hx of of bp little higher at times.   Review of Systems  Constitutional:  Negative for chills, fatigue and fever.  Genitourinary:  Negative for dysuria, flank pain, frequency, penile discharge, penile pain, scrotal swelling and urgency.  Musculoskeletal:  Negative for back pain and joint swelling.  Skin:  Negative for pallor and rash.  Neurological:  Negative for dizziness, seizures, light-headedness, numbness and headaches.  Psychiatric/Behavioral:  Negative for behavioral problems and confusion.     Past Medical History:  Diagnosis Date   Acid reflux    history of   Anemia    Atrial fibrillation ([place]) 10/2017   RVR   Coronary artery disease    Diverticulitis 11/26/2017   Gout    Hepatitis A age 84   History of kidney stones    Hyperlipemia     Hypogonadism male    Irregular heart beat    Ischemic cardiomyopathy    Non compliance with medical treatment    Non-STEMI (non-ST elevated myocardial infarction) ([place]) 10/2017   Pelvic abscess in male [facility])    percutaneous drain placement     Social History   Socioeconomic History   Marital status: Single    Spouse name: Not on file   Number of children: Not on file   Years of education: Not on file   Highest education level: Not on file  Occupational History   Occupation: retired  Tobacco Use   Smoking status: Former   Smokeless tobacco: Never  Scientific laboratory technician Use: Never used  Substance and Sexual Activity   Alcohol use: Yes    Alcohol/week: 0.0 standard drinks    Comment: once monthly   Drug use: No   Sexual activity: Not on file  Other Topics Concern   Not on file  Social History Narrative   Not on file   Social Determinants of Health   Financial Resource Strain: Not on file  Food Insecurity: Not on file  Transportation Needs: Not on file  Physical Activity: Not on file  Stress: Not on file  Social Connections: Not on file  Intimate Partner Violence: Not on file    Past Surgical History:  Procedure Laterality Date   BLADDER REPAIR  02/17/2018   Procedure: BLADDER REPAIR;  Surgeon: Ileana Roup, MD;  [facility];  Service: General;;   BLADDER REPAIR  02/17/2018   Procedure: pelvic exploration, assist with bladder repair ;  Surgeon: Raynelle Bring, MD;  [facility];  Service: Urology;;   COLONOSCOPY  ~ 2014   Dr Ferdinand Lango in [facility].  Diverticulosis.  pt denies colon polyps.     CORONARY ARTERY BYPASS GRAFT N/A 10/26/2017   Procedure: CORONARY ARTERY BYPASS GRAFTING (CABG) x four , using left internal mammary artery and bilateral legs greater saphenous vein harvested endoscopically;  Surgeon: Gaye Pollack, MD;  Location: [place] [facility];  Service: Open Heart Surgery;  Laterality: N/A;   CYSTOSCOPY W/ URETERAL STENT PLACEMENT Bilateral  02/17/2018   Procedure: CYSTOSCOPY WITH RETROGRADE PYELOGRAM/URETERAL STENT PLACEMENT;  Surgeon: Ardis Hughs, MD;  [facility];  Service: Urology;  Laterality: Bilateral;   IR CATHETER TUBE CHANGE  01/27/2018   IR RADIOLOGIST EVAL & MGMT  12/21/2017   LAPAROSCOPIC SIGMOID COLECTOMY N/A 02/17/2018   Procedure: LAPAROSCOPIC SIGMOID COLECTOMY  ERAS PATHWAY TAKEDOWN OF COLOVESSICAL COLOCUTANEOUS FISTULA;  Surgeon: Ileana Roup, MD;  [facility];  Service: General;  Laterality: N/A;   LEFT HEART CATH AND CORONARY ANGIOGRAPHY N/A 10/25/2017   Procedure: LEFT HEART CATH AND CORONARY ANGIOGRAPHY;  Surgeon: Leonie Man, MD;  Location: [place] [facility];  Service: Cardiovascular;  Laterality: N/A;   SIGMOIDOSCOPY N/A 02/17/2018   Procedure: FLEX SIGMOIDOSCOPY;  Surgeon: Ileana Roup, MD;  [facility];  Service: General;  Laterality: N/A;   TEE WITHOUT CARDIOVERSION N/A 10/26/2017   Procedure: TRANSESOPHAGEAL ECHOCARDIOGRAM (TEE);  Surgeon: Gaye Pollack, MD;  Location: [place];  Service: Open Heart Surgery;  Laterality: N/A;    Family History  Problem Relation Age of Onset   Hyperlipidemia Mother    Alzheimer's disease Mother    Hyperlipidemia Brother    Alzheimer's disease Maternal Grandmother    Colon cancer Neg Hx    Esophageal cancer Neg Hx    Stomach cancer Neg Hx     Allergies  Allergen Reactions   Crestor [Rosuvastatin Calcium]     Reported intolerance to Lipitor in the past Crestor 40mg  patient reported muscle aches, dose reduced to rechallenge    Current Outpatient Medications on File Prior to Visit  Medication Sig Dispense Refill   allopurinol (ZYLOPRIM) 100 MG tablet Take 100 mg by mouth daily.      apixaban (ELIQUIS) 5 MG TABS tablet Take 1 tablet (5 mg total) by mouth 2 (two) times daily. 180 tablet 1   atorvastatin (LIPITOR) 10 MG tablet TAKE 1 TABLET BY MOUTH  DAILY 90 tablet 2   dofetilide (TIKOSYN) 500 MCG capsule Take 1 capsule (500  mcg total) by mouth 2 (two) times daily. 180 capsule 2   ezetimibe (ZETIA) 10 MG tablet TAKE ONE-HALF TABLET BY  MOUTH DAILY 45 tablet 3   magnesium oxide (MAG-OX) 400 MG tablet Take 1 tablet (400 mg total) by mouth daily. 90 tablet 1   No current facility-administered medications on file prior to visit.    BP 130/80   Pulse 72   Temp 98.2 F (36.8 C)   Resp 18   Ht 5\' 9"  (1.753 m)   Wt 181 lb (82.1 kg)   SpO2 99%   BMI 26.73 kg/m       Objective:   Physical Exam  General Mental Status- Alert.  General Appearance- Not in acute distress.   Skin General: Color- Normal Color. Moisture- Normal Moisture.  Neck Carotid Arteries- Normal color. Moisture- Normal Moisture. No carotid bruits. No JVD.  Chest and Lung Exam Auscultation: Breath Sounds:-Normal.  Cardiovascular Auscultation:Rythm- Regular. Murmurs & Other Heart Sounds:Auscultation of the heart reveals- No Murmurs.  Abdomen Inspection:-Inspeection Normal. Palpation/Percussion:Note:No mass. Palpation and Percussion of the abdomen reveal- Non Tender, Non Distended + BS, no rebound or guarding.  ]Neurologic Cranial Nerve exam:- CN III-XII intact(No nystagmus), symmetric smile. Strength:- 5/5 equal and symmetric strength both upper and lower extremities.       Assessment & Plan:   Patient Instructions  For low back pain will get lumbar xray. After review will give advise on possible referral.  For left ankle pain and left pretibial swelling will get xray and left lower extremity swelling.  See rheumatologist routinely on follow up.  For skn lesion concerns refer to dermatologist.  For elevated bp concern check bp daily and update me in one week. Today your bp is good.   For colon poyp placed gi md referral.   Mackie Pai, PA-C   Time spent with patient today was 42  minutes which consisted of chart review, discussing diagnosis, work up ,treatment, answering all of patient questions and documentation.

## 2021-09-28 NOTE — Progress Notes (Signed)
fol

## 2021-09-30 NOTE — Telephone Encounter (Signed)
Error

## 2021-10-01 ENCOUNTER — Telehealth: Payer: Self-pay | Admitting: Medical

## 2021-10-01 NOTE — Telephone Encounter (Signed)
I got your message about dermatologist not accepting referrals at this time. So I don't need to put in new referral. Will you just refer to other dermatologist and let pt know why referring to other.   If you want me to put in new referral let me know.

## 2021-10-02 ENCOUNTER — Telehealth: Payer: Self-pay | Admitting: Medical

## 2021-10-02 NOTE — Telephone Encounter (Signed)
Dermatologist that pt was referred to is not taking new pt's, he would like to be referred elsewhere but stay within the [facility] facilities. Please advise.

## 2021-10-19 ENCOUNTER — Ambulatory Visit (INDEPENDENT_AMBULATORY_CARE_PROVIDER_SITE_OTHER): Payer: Medicare Other

## 2021-10-19 VITALS — Ht 69.0 in | Wt 175.0 lb

## 2021-10-19 DIAGNOSIS — Z0000 Encounter for general adult medical examination without abnormal findings: Secondary | ICD-10-CM | POA: Diagnosis not present

## 2021-10-19 NOTE — Progress Notes (Signed)
Subjective:   Dean [name] is a 75 y.o. male who presents for an Initial Medicare Annual Wellness Visit.  I connected with [name] today by telephone and verified that I am speaking with the correct person using two identifiers. Location patient: home Location provider: work Persons participating in the virtual visit: patient, Marine scientist.    I discussed the limitations, risks, security and privacy concerns of performing an evaluation and management service by telephone and the availability of in person appointments. I also discussed with the patient that there may be a patient responsible charge related to this service. The patient expressed understanding and verbally consented to this telephonic visit.    Interactive audio and video telecommunications were attempted between this provider and patient, however failed, due to patient having technical difficulties OR patient did not have access to video capability.  We continued and completed visit with audio only.  Some vital signs may be absent or patient reported.   Time Spent with patient on telephone encounter: 25 minutes   Review of Systems     Cardiac Risk Factors include: advanced age (>34men, >89 women);male gender;dyslipidemia     Objective:    Today's Vitals   10/19/21 0900  Weight: 175 lb (79.4 kg)  Height: 5\' 9"  (1.753 m)   Body mass index is 25.84 kg/m.  Advanced Directives 10/19/2021 06/16/2021 02/19/2018 02/10/2018 11/26/2017 11/26/2017 10/24/2017  Does Patient Have a Medical Advance Directive? Yes Yes Yes Yes No No No  Type of Paramedic of [place];Living will [place];Living will [place];Living will [place];Living will - - -  Does patient want to make changes to medical advance directive? - No - Patient declined No - Patient declined - - - -  Copy of [place] in Chart? No - copy requested No - copy requested - - -  - -  Would patient like information on creating a medical advance directive? - - - - No - Patient declined - No - Patient declined    Current Medications (verified) Outpatient Encounter Medications as of 10/19/2021  Medication Sig   allopurinol (ZYLOPRIM) 100 MG tablet Take 100 mg by mouth daily.    apixaban (ELIQUIS) 5 MG TABS tablet Take 1 tablet (5 mg total) by mouth 2 (two) times daily.   atorvastatin (LIPITOR) 10 MG tablet TAKE 1 TABLET BY MOUTH  DAILY   dofetilide (TIKOSYN) 500 MCG capsule Take 1 capsule (500 mcg total) by mouth 2 (two) times daily.   ezetimibe (ZETIA) 10 MG tablet TAKE ONE-HALF TABLET BY  MOUTH DAILY   magnesium oxide (MAG-OX) 400 MG tablet Take 1 tablet (400 mg total) by mouth daily.   No facility-administered encounter medications on file as of 10/19/2021.    Allergies (verified) Crestor [rosuvastatin calcium]   History: Past Medical History:  Diagnosis Date   Acid reflux    history of   Anemia    Atrial fibrillation ([place]) 10/2017   RVR   Coronary artery disease    Diverticulitis 11/26/2017   Gout    Hepatitis A age 6   History of kidney stones    Hyperlipemia    Hypogonadism male    Irregular heart beat    Ischemic cardiomyopathy    Non compliance with medical treatment    Non-STEMI (non-ST elevated myocardial infarction) ([place]) 10/2017   Pelvic abscess in male [facility])    percutaneous drain placement   Past Surgical History:  Procedure Laterality Date  BLADDER REPAIR  02/17/2018   Procedure: BLADDER REPAIR;  Surgeon: Ileana Roup, MD;  Location: [facility];  Service: General;;   BLADDER REPAIR  02/17/2018   Procedure: pelvic exploration, assist with bladder repair ;  Surgeon: Raynelle Bring, MD;  Location: [facility];  Service: Urology;;   COLONOSCOPY  ~ 2014   Dr Ferdinand Lango in [facility].  Diverticulosis.  pt denies colon polyps.     CORONARY ARTERY BYPASS GRAFT N/A 10/26/2017   Procedure: CORONARY ARTERY BYPASS GRAFTING (CABG) x four , using  left internal mammary artery and bilateral legs greater saphenous vein harvested endoscopically;  Surgeon: Gaye Pollack, MD;  Location: [place] OR;  Service: Open Heart Surgery;  Laterality: N/A;   CYSTOSCOPY W/ URETERAL STENT PLACEMENT Bilateral 02/17/2018   Procedure: CYSTOSCOPY WITH RETROGRADE PYELOGRAM/URETERAL STENT PLACEMENT;  Surgeon: Ardis Hughs, MD;  [facility];  Service: Urology;  Laterality: Bilateral;   IR CATHETER TUBE CHANGE  01/27/2018   IR RADIOLOGIST EVAL & MGMT  12/21/2017   LAPAROSCOPIC SIGMOID COLECTOMY N/A 02/17/2018   Procedure: LAPAROSCOPIC SIGMOID COLECTOMY  ERAS PATHWAY TAKEDOWN OF COLOVESSICAL COLOCUTANEOUS FISTULA;  Surgeon: Ileana Roup, MD;  [facility];  Service: General;  Laterality: N/A;   LEFT HEART CATH AND CORONARY ANGIOGRAPHY N/A 10/25/2017   Procedure: LEFT HEART CATH AND CORONARY ANGIOGRAPHY;  Surgeon: Leonie Man, MD;  Location: [place] [facility];  Service: Cardiovascular;  Laterality: N/A;   SIGMOIDOSCOPY N/A 02/17/2018   Procedure: FLEX SIGMOIDOSCOPY;  Surgeon: Ileana Roup, MD;  [facility];  Service: General;  Laterality: N/A;   TEE WITHOUT CARDIOVERSION N/A 10/26/2017   Procedure: TRANSESOPHAGEAL ECHOCARDIOGRAM (TEE);  Surgeon: Gaye Pollack, MD;  Location: [place];  Service: Open Heart Surgery;  Laterality: N/A;   Family History  Problem Relation Age of Onset   Hyperlipidemia Mother    Alzheimer's disease Mother    Hyperlipidemia Brother    Alzheimer's disease Maternal Grandmother    Colon cancer Neg Hx    Esophageal cancer Neg Hx    Stomach cancer Neg Hx    Social History   Socioeconomic History   Marital status: Single    Spouse name: Not on file   Number of children: Not on file   Years of education: Not on file   Highest education level: Not on file  Occupational History   Occupation: retired  Tobacco Use   Smoking status: Former   Smokeless tobacco: Never  Scientific laboratory technician Use: Never used   Substance and Sexual Activity   Alcohol use: Not Currently   Drug use: No   Sexual activity: Not on file  Other Topics Concern   Not on file  Social History Narrative   Not on file   Social Determinants of Health   Financial Resource Strain: Low Risk    Difficulty of Paying Living Expenses: Not hard at all  Food Insecurity: No Food Insecurity   Worried About Charity fundraiser in the Last Year: Never true   Arboriculturist in the Last Year: Never true  Transportation Needs: No Transportation Needs   Lack of Transportation (Medical): No   Lack of Transportation (Non-Medical): No  Physical Activity: Sufficiently Active   Days of Exercise per Week: 7 days   Minutes of Exercise per Session: 60 min  Stress: No Stress Concern Present   Feeling of Stress : Not at all  Social Connections: Socially Isolated   Frequency of Communication  with Friends and Family: More than three times a week   Frequency of Social Gatherings with Friends and Family: Once a week   Attends Religious Services: Never   Marine scientist or Organizations: No   Attends Music therapist: Never   Marital Status: Never married    Tobacco Counseling Counseling given: Not Answered   Clinical Intake:  Pre-visit preparation completed: Yes  Pain : No/denies pain     BMI - recorded: 25.84 Nutritional Status: BMI 25 -29 Overweight Nutritional Risks: None Diabetes: No  How often do you need to have someone help you when you read instructions, pamphlets, or other written materials from your doctor or pharmacy?: 1 - Never  Diabetic?No  Interpreter Needed?: No  Information entered by :: Caroleen Hamman LPN   Activities of Daily Living In your present state of health, do you have any difficulty performing the following activities: 10/19/2021 06/16/2021  Hearing? N N  Vision? N N  Difficulty concentrating or making decisions? N N  Walking or climbing stairs? N N  Dressing or bathing?  N N  Doing errands, shopping? N N  Preparing Food and eating ? N -  Using the Toilet? N -  In the past six months, have you accidently leaked urine? N -  Do you have problems with loss of bowel control? N -  Managing your Medications? N -  Managing your Finances? N -  Housekeeping or managing your Housekeeping? N -  Some recent data might be hidden    Patient Care Team: Saguier, Iris Pert as PCP - General (Internal Medicine) Nahser, Wonda Cheng, MD as PCP - Cardiology (Cardiology) Nahser, Wonda Cheng, MD as Consulting Physician (Cardiology) Gaye Pollack, MD as Consulting Physician (Cardiothoracic Surgery) Ladene Artist, MD as Consulting Physician (Gastroenterology)  Indicate any recent Medical Services you may have received from other than [facility] providers in the past year (date may be approximate).     Assessment:   This is a routine wellness examination for Kiefer.  Hearing/Vision screen Hearing Screening - Comments:: No issues Vision Screening - Comments:: Last eye exam-02/2021 Wears glasses for driving  Dietary issues and exercise activities discussed: Current Exercise Habits: Home exercise routine, Type of exercise: walking, Time (Minutes): 60, Frequency (Times/Week): 7, Weekly Exercise (Minutes/Week): 420, Intensity: Mild, Exercise limited by: None identified   Goals Addressed             This Visit's Progress    Patient Stated       Keep blood pressure under control & maintain current healthy lifestyle       Depression Screen PHQ 2/9 Scores 10/19/2021 11/27/2017  PHQ - 2 Score 0 0    Fall Risk Fall Risk  10/19/2021  Falls in the past year? 0  Number falls in past yr: 0  Injury with Fall? 0  Follow up Falls prevention discussed    FALL RISK PREVENTION PERTAINING TO THE HOME:  Any stairs in or around the home? Yes  If so, are there any without handrails? No  Home free of loose throw rugs in walkways, pet beds, electrical cords, etc? Yes  Adequate  lighting in your home to reduce risk of falls? Yes   ASSISTIVE DEVICES UTILIZED TO PREVENT FALLS:  Life alert? No  Use of a cane, walker or w/c? No  Grab bars in the bathroom? Yes  Shower chair or bench in shower? No  Elevated toilet seat or a handicapped toilet? Yes   TIMED  UP AND GO:  Was the test performed? No . Phone visit   Cognitive Function:Normal cognitive status assessed by this Nurse Health Advisor. No abnormalities found.          Immunizations Immunization History  Administered Date(s) Administered   PFIZER(Purple Top)SARS-COV-2 Vaccination 01/19/2020, 02/13/2020, 10/22/2020   Pfizer Covid-19 Vaccine Bivalent Booster 62yrs & up 09/17/2021   Zoster Recombinat (Shingrix) 03/03/2021    TDAP status: Due, Education has been provided regarding the importance of this vaccine. Advised may receive this vaccine at local pharmacy or Health Dept. Aware to provide a copy of the vaccination record if obtained from local pharmacy or Health Dept. Verbalized acceptance and understanding.  Flu Vaccine status: Declined, Education has been provided regarding the importance of this vaccine but patient still declined. Advised may receive this vaccine at local pharmacy or Health Dept. Aware to provide a copy of the vaccination record if obtained from local pharmacy or Health Dept. Verbalized acceptance and understanding.  Pneumococcal vaccine status: Up to date  Covid-19 vaccine status: Completed vaccines  Qualifies for Shingles Vaccine? No   Zostavax completed No   Shingrix Completed?: Yes  Screening Tests Health Maintenance  Topic Date Due   Pneumonia Vaccine 17+ Years old (1 - PCV) Never done   Hepatitis C Screening  Never done   TETANUS/TDAP  Never done   Zoster Vaccines- Shingrix (2 of 2) 04/28/2021   INFLUENZA VACCINE  Never done   COLONOSCOPY (Pts 45-39yrs Insurance coverage will need to be confirmed)  11/21/2023   COVID-19 Vaccine  Completed   HPV VACCINES  Aged Out     Health Maintenance  Health Maintenance Due  Topic Date Due   Pneumonia Vaccine 15+ Years old (1 - PCV) Never done   Hepatitis C Screening  Never done   TETANUS/TDAP  Never done   Zoster Vaccines- Shingrix (2 of 2) 04/28/2021   INFLUENZA VACCINE  Never done    Colorectal cancer screening: Type of screening: Colonoscopy. Completed 11/20/2013. Repeat every 10 years  Lung Cancer Screening: (Low Dose CT Chest recommended if Age 63-80 years, 30 pack-year currently smoking OR have quit w/in 15years.) does not qualify.     Additional Screening:  Hepatitis C Screening: does qualify; Completed Discuss with PCP  Vision Screening: Recommended annual ophthalmology exams for early detection of glaucoma and other disorders of the eye. Is the patient up to date with their annual eye exam?  Yes  Who is the provider or what is the name of the office in which the patient attends annual eye exams? Pt unsure of name   Dental Screening: Recommended annual dental exams for proper oral hygiene  Community Resource Referral / Chronic Care Management: CRR required this visit?  No   CCM required this visit?  No      Plan:     I have personally reviewed and noted the following in the patient's chart:   Medical and social history Use of alcohol, tobacco or illicit drugs  Current medications and supplements including opioid prescriptions. Patient is not currently taking opioid prescriptions. Functional ability and status Nutritional status Physical activity Advanced directives List of other physicians Hospitalizations, surgeries, and ER visits in previous 12 months Vitals Screenings to include cognitive, depression, and falls Referrals and appointments  In addition, I have reviewed and discussed with patient certain preventive protocols, quality metrics, and best practice recommendations. A written personalized care plan for preventive services as well as general preventive health  recommendations were provided to patient.  Due to this being a telephonic visit, the after visit summary with patients personalized plan was offered to patient via mail or my-chart. Patient would like to access on my-chart.   Marta Antu, LPN   [id number]  Nurse Health Advisor  Nurse Notes: None

## 2021-10-19 NOTE — Patient Instructions (Signed)
Dean [name] , Thank you for taking time to complete your Medicare Wellness Visit. I appreciate your ongoing commitment to your health goals. Please review the following plan we discussed and let me know if I can assist you in the future.   Screening recommendations/referrals: Colonoscopy: Completed 11/20/2013. Per our conversation, you plan to schedule if February. Recommended yearly ophthalmology/optometry visit for glaucoma screening and checkup Recommended yearly dental visit for hygiene and checkup  Vaccinations: Influenza vaccine: Declined Pneumococcal vaccine: Discuss with PCP. Tdap vaccine: Up to date per our conversation, specific date unknown. Shingles vaccine: Completed vaccines   Covid-19: Up to date  Advanced directives: Please bring a copy of Living Will and/or Healthcare Power of Attorney for your chart.   Conditions/risks identified: See problem list  Next appointment: Follow up in one year for your annual wellness visit. 10/21/2022 @ 9:00.  Preventive Care 20 Years and Older, Male Preventive care refers to lifestyle choices and visits with your health care provider that can promote health and wellness. What does preventive care include? A yearly physical exam. This is also called an annual well check. Dental exams once or twice a year. Routine eye exams. Ask your health care provider how often you should have your eyes checked. Personal lifestyle choices, including: Daily care of your teeth and gums. Regular physical activity. Eating a healthy diet. Avoiding tobacco and drug use. Limiting alcohol use. Practicing safe sex. Taking low doses of aspirin every day. Taking vitamin and mineral supplements as recommended by your health care provider. What happens during an annual well check? The services and screenings done by your health care provider during your annual well check will depend on your age, overall health, lifestyle risk factors, and family history of  disease. Counseling  Your health care provider may ask you questions about your: Alcohol use. Tobacco use. Drug use. Emotional well-being. Home and relationship well-being. Sexual activity. Eating habits. History of falls. Memory and ability to understand (cognition). Work and work Statistician. Screening  You may have the following tests or measurements: Height, weight, and BMI. Blood pressure. Lipid and cholesterol levels. These may be checked every 5 years, or more frequently if you are over 29 years old. Skin check. Lung cancer screening. You may have this screening every year starting at age 39 if you have a 30-pack-year history of smoking and currently smoke or have quit within the past 15 years. Fecal occult blood test (FOBT) of the stool. You may have this test every year starting at age 31. Flexible sigmoidoscopy or colonoscopy. You may have a sigmoidoscopy every 5 years or a colonoscopy every 10 years starting at age 11. Prostate cancer screening. Recommendations will vary depending on your family history and other risks. Hepatitis C blood test. Hepatitis B blood test. Sexually transmitted disease (STD) testing. Diabetes screening. This is done by checking your blood sugar (glucose) after you have not eaten for a while (fasting). You may have this done every 1-3 years. Abdominal aortic aneurysm (AAA) screening. You may need this if you are a current or former smoker. Osteoporosis. You may be screened starting at age 58 if you are at high risk. Talk with your health care provider about your test results, treatment options, and if necessary, the need for more tests. Vaccines  Your health care provider may recommend certain vaccines, such as: Influenza vaccine. This is recommended every year. Tetanus, diphtheria, and acellular pertussis (Tdap, Td) vaccine. You may need a Td booster every 10 years. Zoster vaccine. You  may need this after age 75. Pneumococcal 13-valent  conjugate (PCV13) vaccine. One dose is recommended after age 41. Pneumococcal polysaccharide (PPSV23) vaccine. One dose is recommended after age 64. Talk to your health care provider about which screenings and vaccines you need and how often you need them. This information is not intended to replace advice given to you by your health care provider. Make sure you discuss any questions you have with your health care provider. Document Released: 12/19/2015 Document Revised: 08/11/2016 Document Reviewed: 09/23/2015 Elsevier Interactive Patient Education  2017 [place] Prevention in the Home Falls can cause injuries. They can happen to people of all ages. There are many things you can do to make your home safe and to help prevent falls. What can I do on the outside of my home? Regularly fix the edges of walkways and driveways and fix any cracks. Remove anything that might make you trip as you walk through a door, such as a raised step or threshold. Trim any bushes or trees on the path to your home. Use bright outdoor lighting. Clear any walking paths of anything that might make someone trip, such as rocks or tools. Regularly check to see if handrails are loose or broken. Make sure that both sides of any steps have handrails. Any raised decks and porches should have guardrails on the edges. Have any leaves, snow, or ice cleared regularly. Use sand or salt on walking paths during winter. Clean up any spills in your garage right away. This includes oil or grease spills. What can I do in the bathroom? Use night lights. Install grab bars by the toilet and in the tub and shower. Do not use towel bars as grab bars. Use non-skid mats or decals in the tub or shower. If you need to sit down in the shower, use a plastic, non-slip stool. Keep the floor dry. Clean up any water that spills on the floor as soon as it happens. Remove soap buildup in the tub or shower regularly. Attach bath mats  securely with double-sided non-slip rug tape. Do not have throw rugs and other things on the floor that can make you trip. What can I do in the bedroom? Use night lights. Make sure that you have a light by your bed that is easy to reach. Do not use any sheets or blankets that are too big for your bed. They should not hang down onto the floor. Have a firm chair that has side arms. You can use this for support while you get dressed. Do not have throw rugs and other things on the floor that can make you trip. What can I do in the kitchen? Clean up any spills right away. Avoid walking on wet floors. Keep items that you use a lot in easy-to-reach places. If you need to reach something above you, use a strong step stool that has a grab bar. Keep electrical cords out of the way. Do not use floor polish or wax that makes floors slippery. If you must use wax, use non-skid floor wax. Do not have throw rugs and other things on the floor that can make you trip. What can I do with my stairs? Do not leave any items on the stairs. Make sure that there are handrails on both sides of the stairs and use them. Fix handrails that are broken or loose. Make sure that handrails are as long as the stairways. Check any carpeting to make sure that it is firmly  attached to the stairs. Fix any carpet that is loose or worn. Avoid having throw rugs at the top or bottom of the stairs. If you do have throw rugs, attach them to the floor with carpet tape. Make sure that you have a light switch at the top of the stairs and the bottom of the stairs. If you do not have them, ask someone to add them for you. What else can I do to help prevent falls? Wear shoes that: Do not have high heels. Have rubber bottoms. Are comfortable and fit you well. Are closed at the toe. Do not wear sandals. If you use a stepladder: Make sure that it is fully opened. Do not climb a closed stepladder. Make sure that both sides of the stepladder  are locked into place. Ask someone to hold it for you, if possible. Clearly mark and make sure that you can see: Any grab bars or handrails. First and last steps. Where the edge of each step is. Use tools that help you move around (mobility aids) if they are needed. These include: Canes. Walkers. Scooters. Crutches. Turn on the lights when you go into a dark area. Replace any light bulbs as soon as they burn out. Set up your furniture so you have a clear path. Avoid moving your furniture around. If any of your floors are uneven, fix them. If there are any pets around you, be aware of where they are. Review your medicines with your doctor. Some medicines can make you feel dizzy. This can increase your chance of falling. Ask your doctor what other things that you can do to help prevent falls. This information is not intended to replace advice given to you by your health care provider. Make sure you discuss any questions you have with your health care provider. Document Released: 09/18/2009 Document Revised: 04/29/2016 Document Reviewed: 12/27/2014 Elsevier Interactive Patient Education  2017 [employer].

## 2021-11-08 ENCOUNTER — Encounter: Payer: Self-pay | Admitting: Cardiovascular Disease

## 2021-11-08 NOTE — Progress Notes (Signed)
Cardiology Office Note   Date:  11/09/2021   ID:  Dean [name], DOB Dec 21, 1945, MRN [id number]  PCP:  Mackie Pai, PA-C  Cardiologist:   Mertie Moores, MD   Chief Complaint  Patient presents with   Atrial Fibrillation        Coronary Artery Disease        Problem list 1. CAD  - CABG Nov. 2018 2.   Post of atrial fib  3   Hyperlipidemia 4. Gout 5. GERD  6.  CVA - prior to diagnosis of atrial fib      Dean [name] is a 75 y.o. male who presents for further evaluation of some chest pain and shortness of breath. I have reviewed records from [facility] center.  Has intermittant symptoms of DOE and CP Has occasional palpitations  Is hoarse at times  Has had a stress test - normal  Echo was normal .   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   Oct. 9, 2017:  Has started exercising some.   Is having gout issues which limits his exercise at time  Able to walk around without any significant CP or dyspnea.    Aug. 20, 2018  [name] is seen back for follow-up visit for his premature ventricular contractions. Is taking Uloric for gouty tophi, Makes him nauseated, fatigued.   Has not had any exertional CP recently  Is on Indocin Saw his GP , saw something odd on ECG and he was sent here.  ECG here shows NSR , NS ST abn.  PVCs  Does not exercise,   No hx of CP or DOE Has rare episodes of CP when he is lying down .   March 08, 2018: Doing well from a cardiac standpont  Has had CABG , post op AF .   Nicely from a cardiac standpoint but then developed diverticulitis requiring surgery in mid March.  He was told by somebody during that hospitalization that he may have had a brief episode of atrial fibrillation.  There are no recorded episodes of atrial for ablation on  the telemetry strips and no EKGs suggesting atrial fibrillation.   He related a couple other complaints.  He has a bad taste in his mouth and has no appetite.  He also has had profound itching.  He thinks that it might be due to the warfarin therapy.  June 07, 2018:  Dean [name] is seen today for follow-up of his coronary artery disease and postoperative atrial fibrillation. Feeling great,   Walks 6 days a week at a rapid pace. No CP or dyspnea   Several concerns  - has some tenderness along his SVG harvest site on his left lower leg. Has some sternal tenderness also .   Jan. 10, 2020  Dean [name] is seen today for follow-up of his coronary artery disease and coronary artery bypass grafting.  He also has a history of hyperlipidemia and gout. No recent issues with gout.   Controlled with Uloric  Walks 1 hr a day ( walk / run)  No DOE on hills , no angina  Still has some chest wall tenderness. Has SVG harvest site numbness  Having back issues.    Wonders if its due to Atorvastatin .  March 21, 2020:  Dean [name] is seen today for follow-up of his coronary artery disease, coronary artery bypass grafting, atrial fibrillation, hyperlipidemia and gout.    He has a history of chronic systolic congestive heart failure following his myocardial infarction in 2018. He has a history of postoperative atrial fibrillation and and is back in atrial fibrillation today.  He is back in atrial fibrillation today. He cannot tell that he is   We will be restarting Eliquis 5 mg twice a day Discontinue aspirin Repeat echocardiogram for further assessment of his CHF.  Consider adding low-dose ARB if his LV functions is still depressed. We will have him see an APP in 1 month to determine whether or not he needs cardioversion and I will see him again in 6 months.  Nov. 16, 2021: Dean [name] is seen today for follow up of his CAD, CHF, PAF  His original EF was 35%. Follow up echo in April, 2021 showed an EF of  55-60%. Moderate MR  On Eliquis  He has occasional episodes of tachycardia. Fit bit shows HR of 160 We discussed Kardia monitor Propranolol 10 mg tabs QID PRN   May 08, 2021: Dean [name] is seen today for follow up visit  Has had his first shingles shot Had a bad reaction to the shingles vaccine   Dec. 5, 2022 Dean [name] is seen today for follow up of his CAD , PAF , CHF BP is elevated at time  Still eating some salty foods.  His diet has improved.      Is walking 4 miles most days a week .  No CP , no dyspnea No recentl atrial fib , tikosyn is keeping him well controlled.    Past Medical History:  Diagnosis Date   Acid reflux    history of   Anemia    Atrial fibrillation ([place]) 10/2017   RVR   Coronary artery disease    Diverticulitis 11/26/2017   Gout    Hepatitis A age 70   History of kidney stones    Hyperlipemia    Hypogonadism male    Irregular heart beat    Ischemic cardiomyopathy    Non compliance with medical treatment    Non-STEMI (non-ST elevated myocardial infarction) ([place]) 10/2017   Pelvic abscess in male [facility])    percutaneous drain placement    Past Surgical History:  Procedure Laterality Date   BLADDER REPAIR  02/17/2018   Procedure: BLADDER REPAIR;  Surgeon: Ileana Roup, MD;  Location: [facility];  Service: General;;   BLADDER REPAIR  02/17/2018   Procedure: pelvic exploration, assist with bladder repair ;  Surgeon: Raynelle Bring, MD;  Location: [facility];  Service: Urology;;   COLONOSCOPY  ~ 2014   Dr Ferdinand Lango [facility].  Diverticulosis.  pt denies colon polyps.     CORONARY ARTERY BYPASS GRAFT N/A 10/26/2017   Procedure: CORONARY ARTERY BYPASS GRAFTING (CABG) x four , using left internal mammary artery and bilateral legs greater saphenous vein harvested endoscopically;  Surgeon: Gaye Pollack, MD;  Location: [place] OR;  Service: Open Heart Surgery;  Laterality: N/A;   CYSTOSCOPY W/ URETERAL STENT PLACEMENT Bilateral 02/17/2018   Procedure: CYSTOSCOPY  WITH RETROGRADE PYELOGRAM/URETERAL STENT PLACEMENT;  Surgeon: Ardis Hughs, MD;  Location: [facility];  Service: Urology;  Laterality: Bilateral;   IR CATHETER TUBE CHANGE  01/27/2018   IR RADIOLOGIST EVAL & MGMT  12/21/2017   LAPAROSCOPIC SIGMOID COLECTOMY N/A 02/17/2018   Procedure: LAPAROSCOPIC SIGMOID COLECTOMY  ERAS PATHWAY  TAKEDOWN OF COLOVESSICAL COLOCUTANEOUS FISTULA;  Surgeon: Ileana Roup, MD;  Location: [facility];  Service: General;  Laterality: N/A;   LEFT HEART CATH AND CORONARY ANGIOGRAPHY N/A 10/25/2017   Procedure: LEFT HEART CATH AND CORONARY ANGIOGRAPHY;  Surgeon: Leonie Man, MD;  Location: [place] [facility];  Service: Cardiovascular;  Laterality: N/A;   SIGMOIDOSCOPY N/A 02/17/2018   Procedure: FLEX SIGMOIDOSCOPY;  Surgeon: Ileana Roup, MD;  Location: [facility];  Service: General;  Laterality: N/A;   TEE WITHOUT CARDIOVERSION N/A 10/26/2017   Procedure: TRANSESOPHAGEAL ECHOCARDIOGRAM (TEE);  Surgeon: Gaye Pollack, MD;  Location: [place];  Service: Open Heart Surgery;  Laterality: N/A;     Current Outpatient Medications  Medication Sig Dispense Refill   allopurinol (ZYLOPRIM) 100 MG tablet Take 100 mg by mouth daily.      apixaban (ELIQUIS) 5 MG TABS tablet Take 1 tablet (5 mg total) by mouth 2 (two) times daily. 180 tablet 1   atorvastatin (LIPITOR) 10 MG tablet TAKE 1 TABLET BY MOUTH  DAILY 90 tablet 2   dofetilide (TIKOSYN) 500 MCG capsule Take 1 capsule (500 mcg total) by mouth 2 (two) times daily. 180 capsule 2   ezetimibe (ZETIA) 10 MG tablet TAKE ONE-HALF TABLET BY  MOUTH DAILY 45 tablet 3   losartan (COZAAR) 50 MG tablet Take 1 tablet (50 mg total) by mouth daily. 90 tablet 3   magnesium oxide (MAG-OX) 400 MG tablet Take 1 tablet (400 mg total) by mouth daily. 90 tablet 1   No current facility-administered medications for this visit.    Allergies:   Crestor [rosuvastatin calcium]    Social History:  The patient  reports that he has quit  smoking. He has never used smokeless tobacco. He reports that he does not currently use alcohol. He reports that he does not use drugs.   Family History:  The patient's family history includes Alzheimer's disease in his maternal grandmother and mother; Hyperlipidemia in his brother and mother.    ROS:     Physical Exam: Blood pressure 138/80, pulse 63, height 5' 9.5" (1.765 m), weight 183 lb 9.6 oz (83.3 kg), SpO2 99 %.  GEN:  Well nourished, well developed in no acute distress HEENT: Normal NECK: No JVD; No carotid bruits LYMPHATICS: No lymphadenopathy CARDIAC: RRR , no murmurs, rubs, gallops RESPIRATORY:  Clear to auscultation without rales, wheezing or rhonchi  ABDOMEN: Soft, non-tender, non-distended MUSCULOSKELETAL:  No edema; No deformity  SKIN: Warm and dry NEUROLOGIC:  Alert and oriented x 3    EKG:      Recent Labs: 07/30/2021: BUN 23; Creatinine, Ser 1.11; Magnesium 2.2; Potassium 4.9; Sodium 143    Lipid Panel    Component Value Date/Time   CHOL 170 10/21/2020 1013   TRIG 174 (H) 10/21/2020 1013   HDL 48 10/21/2020 1013   CHOLHDL 3.5 10/21/2020 1013   CHOLHDL 3.3 10/25/2017 0341   VLDL 21 10/25/2017 0341   LDLCALC 92 10/21/2020 1013      Wt Readings from Last 3 Encounters:  11/09/21 183 lb 9.6 oz (83.3 kg)  10/19/21 175 lb (79.4 kg)  09/28/21 181 lb (82.1 kg)      Other studies Reviewed: Additional studies/ records that were reviewed today include: . Review of the above records demonstrates:   ASSESSMENT AND PLAN:  1.  CAD - s/p CABG  -he denies having any episodes of angina.  2. Atrial fib;     Tikosyn and metoprolol kept and well controlled.  3.  Chronic systolic congestive heart failure: stable   3.  Hyperlipidemia -     4.  Hypertension: Blood pressures been a little bit elevated.  We will start him on losartan 50 mg a day.  We will check basic metabolic profile, lipid profile, ALT in 3 weeks.   Current medicines are reviewed at  length with the patient today.  The patient does not have concerns regarding medicines.  The following changes have been made:  no change  Labs/ tests ordered today include:   Orders Placed This Encounter  Procedures   ALT   Basic metabolic panel   Lipid panel   AMB Referral to [facility] Pharm-D      Disposition:   Follow-up with APP in 6 months     Mertie Moores, MD  11/09/2021 1:14 PM    [facility] [address], [place], [place]  [address] Phone: [phone]; Fax: [phone]

## 2021-11-09 ENCOUNTER — Encounter: Payer: Self-pay | Admitting: Cardiovascular Disease

## 2021-11-09 ENCOUNTER — Ambulatory Visit: Payer: Medicare Other | Admitting: Cardiovascular Disease

## 2021-11-09 ENCOUNTER — Other Ambulatory Visit: Payer: Self-pay

## 2021-11-09 VITALS — BP 138/80 | HR 63 | Ht 69.5 in | Wt 183.6 lb

## 2021-11-09 DIAGNOSIS — E782 Mixed hyperlipidemia: Secondary | ICD-10-CM | POA: Diagnosis not present

## 2021-11-09 DIAGNOSIS — I10 Essential (primary) hypertension: Secondary | ICD-10-CM | POA: Diagnosis not present

## 2021-11-09 DIAGNOSIS — Z79899 Other long term (current) drug therapy: Secondary | ICD-10-CM

## 2021-11-09 MED ORDER — LOSARTAN POTASSIUM 50 MG PO TABS: 50.0000 mg | ORAL_TABLET | Freq: Every day | ORAL | 3 refills | Status: DC

## 2021-11-09 NOTE — Patient Instructions (Signed)
Medication Instructions:  Please start Losartan 50 mg a day. Continue all other medications as listed.  *If you need a refill on your cardiac medications before your next appointment, please call your pharmacy*   Lab Work: Please have blood work in 3 weeks (BMP, Lipid, ALT)  If you have labs (blood work) drawn today and your tests are completely normal, you will receive your results only by: [place] (if you have MyChart) OR A paper copy in the mail If you have any lab test that is abnormal or we need to change your treatment, we will call you to review the results.  Please see the Hypertension Clinic in 1 to 2 months.  Follow-Up: At [facility], you and your health needs are our priority.  As part of our continuing mission to provide you with exceptional heart care, we have created designated Provider Care Teams.  These Care Teams include your primary Cardiologist (physician) and Advanced Practice Providers (APPs -  Physician Assistants and Nurse Practitioners) who all work together to provide you with the care you need, when you need it.  We recommend signing up for the patient portal called "MyChart".  Sign up information is provided on this After Visit Summary.  MyChart is used to connect with patients for Virtual Visits (Telemedicine).  Patients are able to view lab/test results, encounter notes, upcoming appointments, etc.  Non-urgent messages can be sent to your provider as well.   To learn more about what you can do with MyChart, go to [email or web address].    Your next appointment:   1 year(s)  The format for your next appointment:   In Person  Provider:   Mertie Moores, MD     Thank you for choosing [facility]!!

## 2021-11-11 ENCOUNTER — Other Ambulatory Visit: Payer: Self-pay | Admitting: Cardiovascular Disease

## 2021-11-20 ENCOUNTER — Encounter: Payer: Self-pay | Admitting: Medical

## 2021-11-20 ENCOUNTER — Encounter (HOSPITAL_COMMUNITY): Payer: Self-pay

## 2021-11-20 ENCOUNTER — Telehealth: Payer: Self-pay | Admitting: Medical

## 2021-11-20 ENCOUNTER — Other Ambulatory Visit (HOSPITAL_COMMUNITY): Payer: Self-pay | Admitting: Physician Assistant

## 2021-11-20 NOTE — Telephone Encounter (Signed)
Pt wants to see  dermatologist Dr. Linus Galas. Phone number [phone].  [address]   The skin lesion area is on nose and rt side of face. Due to area being on pt face can he be seen within 2 weeks.      Pt sent me a message that Dr. Susie Cassette was not accepting new pt until January. Did you send referral to his office. Looks like referral sent to other office? Will you update me on his referral on Monday?    Pt appears pt has not been contacted??

## 2021-11-25 NOTE — Telephone Encounter (Signed)
Called pt , received dial tone , messaged pt via [employer]

## 2021-12-01 ENCOUNTER — Other Ambulatory Visit: Payer: Self-pay

## 2021-12-01 ENCOUNTER — Other Ambulatory Visit: Payer: Medicare Other | Admitting: *Deleted

## 2021-12-01 DIAGNOSIS — E782 Mixed hyperlipidemia: Secondary | ICD-10-CM

## 2021-12-01 DIAGNOSIS — Z79899 Other long term (current) drug therapy: Secondary | ICD-10-CM

## 2021-12-01 DIAGNOSIS — I10 Essential (primary) hypertension: Secondary | ICD-10-CM

## 2021-12-01 LAB — LIPID PANEL
Chol/HDL Ratio: 2.8 ratio (ref 0.0–5.0)
Cholesterol, Total: 154 mg/dL (ref 100–199)
HDL: 56 mg/dL (ref >39)
LDL Chol Calc (NIH): 83 mg/dL (ref 0–99)
Triglycerides: 77 mg/dL (ref 0–149)
VLDL Cholesterol Cal: 15 mg/dL (ref 5–40)

## 2021-12-01 LAB — BASIC METABOLIC PANEL
BUN/Creatinine Ratio: 18 (ref 10–24)
BUN: 21 mg/dL (ref 8–27)
CO2: 24 mmol/L (ref 20–29)
Calcium: 9.5 mg/dL (ref 8.6–10.2)
Chloride: 105 mmol/L (ref 96–106)
Creatinine, Ser: 1.16 mg/dL (ref 0.76–1.27)
Glucose: 107 mg/dL — ABNORMAL HIGH (ref 70–99)
Potassium: 4.5 mmol/L (ref 3.5–5.2)
Sodium: 141 mmol/L (ref 134–144)
eGFR: 67 mL/min/{1.73_m2} (ref >59)

## 2021-12-01 LAB — ALT: ALT: 10 IU/L (ref 0–44)

## 2021-12-02 ENCOUNTER — Telehealth: Payer: Self-pay

## 2021-12-02 NOTE — Telephone Encounter (Signed)
Spoke with pt who states he has tried Rosuvastatin in the past and had a great deal of joint pain and had to stop the medication.  He has tolerated the Atorvastatin much better and is hesitant to change. Pt advised will forward to Dr Acie Fredrickson for further recommendation.  Pt verbalizes understanding and thanked Therapist, sports for the call.

## 2021-12-02 NOTE — Telephone Encounter (Signed)
-----   Message from Thayer Headings, MD sent at 12/01/2021  5:48 PM EST ----- His LDL is ok  (83) but is still higher than his goal of between 50-70. Would he consider stopping the atorvastatin and trying Rosuvastatin 20 mg a day with a recheck of his Lipid profile and ALT in 3 months

## 2021-12-02 NOTE — Telephone Encounter (Signed)
Spoke with pt and advised per Dr Acie Fredrickson pt should continue current medication for now and at some point may consider referral to lipid clinic.  Pt verbalizes understanding and agrees with current plan.

## 2021-12-08 ENCOUNTER — Encounter: Payer: Self-pay | Admitting: Medical

## 2021-12-10 ENCOUNTER — Other Ambulatory Visit: Payer: Self-pay | Admitting: Cardiovascular Disease

## 2021-12-10 ENCOUNTER — Encounter: Payer: Self-pay | Admitting: Cardiovascular Disease

## 2021-12-10 NOTE — Telephone Encounter (Signed)
This encounter was created in error - please disregard.

## 2021-12-10 NOTE — Telephone Encounter (Signed)
Prescription refill request for Eliquis received. Indication: afib  Last office visit: [place], 11/09/2021 Scr: 1.16, 12/01/2021 Age: 76 yo  Weight: 83.3 kg  Refill sent.

## 2022-01-25 ENCOUNTER — Ambulatory Visit: Payer: Medicare Other | Admitting: Pharmacist

## 2022-01-25 ENCOUNTER — Other Ambulatory Visit: Payer: Self-pay

## 2022-01-25 VITALS — BP 142/80 | HR 50

## 2022-01-25 DIAGNOSIS — I10 Essential (primary) hypertension: Secondary | ICD-10-CM | POA: Diagnosis not present

## 2022-01-25 DIAGNOSIS — E782 Mixed hyperlipidemia: Secondary | ICD-10-CM | POA: Diagnosis not present

## 2022-01-25 MED ORDER — LOSARTAN POTASSIUM 100 MG PO TABS: 100.0000 mg | ORAL_TABLET | Freq: Every day | ORAL | 3 refills | Status: DC

## 2022-01-25 NOTE — Patient Instructions (Signed)
It was nice to see you today  You blood pressure goal is < 130/68mmHg  Increase your losartan from 50mg  to 100mg  daily  Continue to monitor your blood pressure at home  Bring in your readings and cuff to your next visit in the end of march

## 2022-01-25 NOTE — Progress Notes (Signed)
Patient ID: Dean [name]                 DOB: 08-Jan-1946                      MRN: [id number]     HPI: Dean [name] is a 76 y.o. male referred by Dr. Acie Fredrickson to HTN clinic. PMH is significant for CAD s/p NSTEMI and CABG in 2018, post op afib, CVA (prior to afib dx), HLD, gout, and GERD. I have previously followed pt in lipid clinic. At most recent visit with Dr Acie Fredrickson 11/09/21, BP was elevated at 138/80 and pt was started on losartan 50mg  daily with PharmD referral. F/u BMET was stable.  Pt presents today for follow up. Reports tolerating meds well. Initially felt a little dizzy after he started losartan but that has resolved. Denies headaches and dizziness. Reports very slight chronic swelling in his left ankle that is not bothersome. Has checked BP daily using a home Omron bicep cuff that he's had for 3-4 years. Checks BP in the AM and takes losartan around 3pm. Prefers not to take medications if he doesn't need them. Stays active and eats a heart healthy diet.  Current HTN meds: losartan 50mg  daily (~3pm)  BP goal: <130/40mmHg  Family History: Alzheimer's disease in his maternal grandmother and mother; Hyperlipidemia in his brother and mother  Social History: The patient  reports that he has quit smoking. He has never used smokeless tobacco. He reports that he does not currently use alcohol. He reports that he does not use drugs.   Diet: no fast food, fried food or soda. Rarely eats red meat. Eats whole grains. Minimal dairy. 2 cups of coffee and oatmeal in the AM.   Exercise: walks 4 miles most days a week for an hour  Home BP readings: 25 at goal, 31 above goal (45% at goal)   Wt Readings from Last 3 Encounters:  11/09/21 183 lb 9.6 oz (83.3 kg)  10/19/21 175 lb (79.4 kg)  09/28/21 181 lb (82.1 kg)   BP Readings from Last 3 Encounters:  11/09/21 138/80  09/28/21 130/80  07/30/21 (!) 150/86   Pulse Readings from Last 3 Encounters:  11/09/21 63  09/28/21 72  07/30/21 (!) 57     Renal function: CrCl cannot be calculated (Patient's most recent lab result is older than the maximum 21 days allowed.).  Past Medical History:  Diagnosis Date   Acid reflux    history of   Anemia    Atrial fibrillation ([place]) 10/2017   RVR   Coronary artery disease    Diverticulitis 11/26/2017   Gout    Hepatitis A age 45   History of kidney stones    Hyperlipemia    Hypogonadism male    Irregular heart beat    Ischemic cardiomyopathy    Non compliance with medical treatment    Non-STEMI (non-ST elevated myocardial infarction) ([place]) 10/2017   Pelvic abscess in male [facility])    percutaneous drain placement    Current Outpatient Medications on File Prior to Visit  Medication Sig Dispense Refill   allopurinol (ZYLOPRIM) 100 MG tablet Take 100 mg by mouth daily.      atorvastatin (LIPITOR) 10 MG tablet TAKE 1 TABLET BY MOUTH  DAILY 90 tablet 3   dofetilide (TIKOSYN) 500 MCG capsule Take 1 capsule (500 mcg total) by mouth 2 (two) times daily. 180 capsule 2   ELIQUIS 5 MG TABS tablet TAKE 1 TABLET(5  MG) BY MOUTH TWICE DAILY 180 tablet 1   ezetimibe (ZETIA) 10 MG tablet TAKE ONE-HALF TABLET BY  MOUTH DAILY 45 tablet 3   losartan (COZAAR) 50 MG tablet Take 1 tablet (50 mg total) by mouth daily. 90 tablet 3   MAGNESIUM-OXIDE 400 (240 Mg) MG tablet TAKE 1 TABLET(400 MG) BY MOUTH DAILY 90 tablet 2   No current facility-administered medications on file prior to visit.    Allergies  Allergen Reactions   Crestor [Rosuvastatin Calcium]     Reported intolerance to Lipitor in the past Crestor 40mg  patient reported muscle aches, dose reduced to rechallenge     Assessment/Plan:  1. Hypertension - BP above goal < 130/22mmHg in office. Home BP log checked daily shows 45% of readings at goal. Will increase losartan from 50mg  to 100mg  daily. Pt will keep log of home BP readings and bring this in to next visit along with home cuff in 5 weeks for BP and BMET check.  2. Hyperlipidemia -  Discussed more aggressive LDL goal of < 55 compared to < 70 (hx of CAD + CVA). LDL most recently 83 on max tolerated statin dose of atorvastatin 10mg  daily + ezeitmibe 5mg  daily. Previously intolerant to atorvastatin 20mg  daily and rosuvastatin 10mg  daily (knee pain). Discussed addition of PCSK9i today. Pt wishes to have labs rechecked this summer and reassess.  Luvern Mcisaac E. Kelton Bultman, PharmD, BCACP, [place] [address] [place], [place] [address] Phone: [phone]; Fax: [phone] 01/25/2022 10:13 AM

## 2022-02-02 ENCOUNTER — Other Ambulatory Visit: Payer: Self-pay

## 2022-02-02 ENCOUNTER — Ambulatory Visit (HOSPITAL_COMMUNITY)
Admission: RE | Admit: 2022-02-02 | Discharge: 2022-02-02 | Disposition: A | Discharge status: Home / Self Care | Payer: Medicare Other | Source: Ambulatory Visit | Attending: Nurse Practitioner | Admitting: Nurse Practitioner

## 2022-02-02 VITALS — BP 128/64 | HR 60 | Ht 69.5 in | Wt 182.8 lb

## 2022-02-02 DIAGNOSIS — D6869 Other thrombophilia: Secondary | ICD-10-CM

## 2022-02-02 DIAGNOSIS — Z7901 Long term (current) use of anticoagulants: Secondary | ICD-10-CM | POA: Insufficient documentation

## 2022-02-02 DIAGNOSIS — I340 Nonrheumatic mitral (valve) insufficiency: Secondary | ICD-10-CM | POA: Insufficient documentation

## 2022-02-02 DIAGNOSIS — Z9049 Acquired absence of other specified parts of digestive tract: Secondary | ICD-10-CM | POA: Insufficient documentation

## 2022-02-02 DIAGNOSIS — Z79899 Other long term (current) drug therapy: Secondary | ICD-10-CM | POA: Insufficient documentation

## 2022-02-02 DIAGNOSIS — I509 Heart failure, unspecified: Secondary | ICD-10-CM | POA: Insufficient documentation

## 2022-02-02 DIAGNOSIS — I4819 Other persistent atrial fibrillation: Secondary | ICD-10-CM | POA: Diagnosis not present

## 2022-02-02 DIAGNOSIS — Z951 Presence of aortocoronary bypass graft: Secondary | ICD-10-CM | POA: Insufficient documentation

## 2022-02-02 DIAGNOSIS — I480 Paroxysmal atrial fibrillation: Secondary | ICD-10-CM

## 2022-02-02 DIAGNOSIS — I2510 Atherosclerotic heart disease of native coronary artery without angina pectoris: Secondary | ICD-10-CM | POA: Diagnosis not present

## 2022-02-02 LAB — CBC
HCT: 41.9 % (ref 39.0–52.0)
Hemoglobin: 13.5 g/dL (ref 13.0–17.0)
MCH: 31.5 pg (ref 26.0–34.0)
MCHC: 32.2 g/dL (ref 30.0–36.0)
MCV: 97.9 fL (ref 80.0–100.0)
Platelets: 142 10*3/uL — ABNORMAL LOW (ref 150–400)
RBC: 4.28 MIL/uL (ref 4.22–5.81)
RDW: 12.8 % (ref 11.5–15.5)
WBC: 5.4 10*3/uL (ref 4.0–10.5)
nRBC: 0 % (ref 0.0–0.2)

## 2022-02-02 LAB — BASIC METABOLIC PANEL
Anion gap: 6 (ref 5–15)
BUN: 21 mg/dL (ref 8–23)
CO2: 28 mmol/L (ref 22–32)
Calcium: 9.6 mg/dL (ref 8.9–10.3)
Chloride: 107 mmol/L (ref 98–111)
Creatinine, Ser: 1.32 mg/dL — ABNORMAL HIGH (ref 0.61–1.24)
GFR, Estimated: 57 mL/min — ABNORMAL LOW (ref >60)
Glucose, Bld: 108 mg/dL — ABNORMAL HIGH (ref 70–99)
Potassium: 4.7 mmol/L (ref 3.5–5.1)
Sodium: 141 mmol/L (ref 135–145)

## 2022-02-02 LAB — MAGNESIUM: Magnesium: 2.1 mg/dL (ref 1.7–2.4)

## 2022-02-02 NOTE — Progress Notes (Signed)
Primary Care Physician: Dean [name] Referring Physician: Dr. Louanna Raw [name] is a 76 y.o. male with a h/o CAD, diverticulosis s/p colectomy,  s/p bypass, CHF, PAF, moderate MR, that is in the afib clinic per Dr. Acie [name] to discuss options for persistent afib to restore SR. He is currently on Eliquis 5 mg bid for a CHA2DS2VASc  score of at least 3. Pt on carvedilol 3.125 mg bid for rate control. Ekg shows afib with v rate of 70 bpm.   F/u 06/16/21. Pt is here for tikosyn admit. No benadryl use. PharmD did not see any drugs that were contraindicated with Tikosyn. No benadryl use. He reminds in afib rate controlled, qt is acceptable.   Pt denies tobacco use, minimal use of alcohol, some snoring but denies daytime somnolence. He has minimal symptoms from afib and is rate controlled. He feels that his afib has been persistent for  at least 6 months.   F/u in afib clinic, 02/02/22, for Tikosyn surveillance. He reports no afib. He did have his losartan doubled recently for BP issues and looks controlled today. Qtc is stable.   Today, he denies symptoms of palpitations, chest pain, shortness of breath, orthopnea, PND, lower extremity edema, dizziness, presyncope, syncope, or neurologic sequela. The patient is tolerating medications without difficulties and is otherwise without complaint today.   Past Medical History:  Diagnosis Date   Acid reflux    history of   Anemia    Atrial fibrillation ([place]) 10/2017   RVR   Coronary artery disease    Diverticulitis 11/26/2017   Gout    Hepatitis A age 71   History of kidney stones    Hyperlipemia    Hypogonadism male    Irregular heart beat    Ischemic cardiomyopathy    Non compliance with medical treatment    Non-STEMI (non-ST elevated myocardial infarction) ([place]) 10/2017   Pelvic abscess in male [facility])    percutaneous drain placement   Past Surgical History:  Procedure Laterality Date   BLADDER REPAIR  02/17/2018   Procedure:  BLADDER REPAIR;  Surgeon: Ileana Roup, MD;  Location: [facility];  Service: General;;   BLADDER REPAIR  02/17/2018   Procedure: pelvic exploration, assist with bladder repair ;  Surgeon: Raynelle Bring, MD;  Location: [facility];  Service: Urology;;   COLONOSCOPY  ~ 2014   Dr Ferdinand Lango in [facility].  Diverticulosis.  pt denies colon polyps.     CORONARY ARTERY BYPASS GRAFT N/A 10/26/2017   Procedure: CORONARY ARTERY BYPASS GRAFTING (CABG) x four , using left internal mammary artery and bilateral legs greater saphenous vein harvested endoscopically;  Surgeon: [place] Pollack, MD;  Location: [facility];  Service: Open Heart Surgery;  Laterality: N/A;   CYSTOSCOPY W/ URETERAL STENT PLACEMENT Bilateral 02/17/2018   Procedure: CYSTOSCOPY WITH RETROGRADE PYELOGRAM/URETERAL STENT PLACEMENT;  Surgeon: Ardis Hughs, MD;  Location: [facility];  Service: Urology;  Laterality: Bilateral;   IR CATHETER TUBE CHANGE  01/27/2018   IR RADIOLOGIST EVAL & MGMT  12/21/2017   LAPAROSCOPIC SIGMOID COLECTOMY N/A 02/17/2018   Procedure: LAPAROSCOPIC SIGMOID COLECTOMY  ERAS PATHWAY TAKEDOWN OF COLOVESSICAL COLOCUTANEOUS FISTULA;  Surgeon: Ileana Roup, MD;  Location: [facility];  Service: General;  Laterality: N/A;   LEFT HEART CATH AND CORONARY ANGIOGRAPHY N/A 10/25/2017   Procedure: LEFT HEART CATH AND CORONARY ANGIOGRAPHY;  Surgeon: Leonie Man, MD;  Location: [facility];  Service: Cardiovascular;  Laterality: N/A;   SIGMOIDOSCOPY  N/A 02/17/2018   Procedure: FLEX SIGMOIDOSCOPY;  Surgeon: Ileana Roup, MD;  Location: [facility];  Service: General;  Laterality: N/A;   TEE WITHOUT CARDIOVERSION N/A 10/26/2017   Procedure: TRANSESOPHAGEAL ECHOCARDIOGRAM (TEE);  Surgeon: [place] Pollack, MD;  Location: [facility];  Service: Open Heart Surgery;  Laterality: N/A;    Current Outpatient Medications  Medication Sig Dispense Refill   allopurinol (ZYLOPRIM) 100 MG tablet Take 100 mg by mouth daily.      atorvastatin  (LIPITOR) 10 MG tablet TAKE 1 TABLET BY MOUTH  DAILY 90 tablet 3   dofetilide (TIKOSYN) 500 MCG capsule Take 1 capsule (500 mcg total) by mouth 2 (two) times daily. 180 capsule 2   ELIQUIS 5 MG TABS tablet TAKE 1 TABLET(5 MG) BY MOUTH TWICE DAILY 180 tablet 1   ezetimibe (ZETIA) 10 MG tablet TAKE ONE-HALF TABLET BY  MOUTH DAILY 45 tablet 3   losartan (COZAAR) 100 MG tablet Take 1 tablet (100 mg total) by mouth daily. 90 tablet 3   MAGNESIUM-OXIDE 400 (240 Mg) MG tablet TAKE 1 TABLET(400 MG) BY MOUTH DAILY 90 tablet 2   No current facility-administered medications for this encounter.    Allergies  Allergen Reactions   Crestor [Rosuvastatin Calcium]     Reported intolerance to Lipitor in the past Crestor 40mg  patient reported muscle aches, dose reduced to rechallenge    Social History   Socioeconomic History   Marital status: Single    Spouse name: Not on file   Number of children: Not on file   Years of education: Not on file   Highest education level: Not on file  Occupational History   Occupation: retired  Tobacco Use   Smoking status: Former   Smokeless tobacco: Never  Scientific laboratory technician Use: Never used  Substance and Sexual Activity   Alcohol use: Not Currently   Drug use: No   Sexual activity: Not on file  Other Topics Concern   Not on file  Social History Narrative   Not on file   Social Determinants of Health   Financial Resource Strain: Low Risk    Difficulty of Paying Living Expenses: Not hard at all  Food Insecurity: No Food Insecurity   Worried About Charity fundraiser in the Last Year: Never true   Arboriculturist in the Last Year: Never true  Transportation Needs: No Transportation Needs   Lack of Transportation (Medical): No   Lack of Transportation (Non-Medical): No  Physical Activity: Sufficiently Active   Days of Exercise per Week: 7 days   Minutes of Exercise per Session: 60 min  Stress: No Stress Concern Present   Feeling of Stress : Not at  all  Social Connections: Socially Isolated   Frequency of Communication with Friends and Family: More than three times a week   Frequency of Social Gatherings with Friends and Family: Once a week   Attends Religious Services: Never   Marine scientist or Organizations: No   Attends Music therapist: Never   Marital Status: Never married  Human resources officer Violence: Not At Risk   Fear of Current or Ex-Partner: No   Emotionally Abused: No   Physically Abused: No   Sexually Abused: No    Family History  Problem Relation Age of Onset   Hyperlipidemia Mother    Alzheimer's disease Mother    Hyperlipidemia Brother    Alzheimer's disease Maternal Grandmother    Colon cancer Neg Hx  Esophageal cancer Neg Hx    Stomach cancer Neg Hx     ROS- All systems are reviewed and negative except as per the HPI above  Physical Exam: Vitals:   02/02/22 1509  BP: 128/64  Pulse: 60  Weight: 82.9 kg  Height: 5' 9.5" (1.765 m)   Wt Readings from Last 3 Encounters:  02/02/22 82.9 kg  11/09/21 83.3 kg  10/19/21 79.4 kg    Labs: Lab Results  Component Value Date   NA 141 12/01/2021   K 4.5 12/01/2021   CL 105 12/01/2021   CO2 24 12/01/2021   GLUCOSE 107 (H) 12/01/2021   BUN 21 12/01/2021   CREATININE 1.16 12/01/2021   CALCIUM 9.5 12/01/2021   PHOS 4.0 02/22/2018   MG 2.2 07/30/2021   Lab Results  Component Value Date   INR 1.41 02/18/2018   Lab Results  Component Value Date   CHOL 154 12/01/2021   HDL 56 12/01/2021   LDLCALC 83 12/01/2021   TRIG 77 12/01/2021     GEN- The patient is well appearing, alert and oriented x 3 today.   Head- normocephalic, atraumatic Eyes-  Sclera clear, conjunctiva pink Ears- hearing intact Oropharynx- clear Neck- supple, no JVP Lymph- no cervical lymphadenopathy Lungs- Clear to ausculation bilaterally, normal work of breathing Heart- Regular rate and rhythm, no murmurs, rubs or gallops, PMI not laterally displaced GI-  soft, NT, ND, + BS Extremities- no clubbing, cyanosis, or edema MS- no significant deformity or atrophy Skin- no rash or lesion Psych- euthymic mood, full affect Neuro- strength and sensation are intact  EKG-  Vent. rate 60 BPM PR interval 212 ms QRS duration 124 ms QT/QTcB 442/442 ms P-R-T axes 51 15 66 Sinus rhythm with 1st degree A-V block Non-specific intra-ventricular conduction delay Borderline ECG When compared with ECG of 26-Jun-2021 10:28, PREVIOUS ECG IS PRESENT  Echo-  1. Improvement of the LVEF noted as compared to echo from 2018. Segmental  wall motion involving lateral and inferior wall documented on echo from  2018 not seen on this study. Left ventricular ejection fraction, by  estimation, is 55 to 60%. The left  ventricle has normal function. The left ventricle has no regional wall  motion abnormalities. There is mild left ventricular hypertrophy. Left  ventricular diastolic parameters were normal.   2. Right ventricular systolic function is normal. The right ventricular  size is normal. There is normal pulmonary artery systolic pressure.   3. The mitral valve is normal in structure. Moderate mitral valve  regurgitation. No evidence of mitral stenosis.   4. The aortic valve is normal in structure. Aortic valve regurgitation is  trivial. No aortic stenosis is present.   5. The inferior vena cava is normal in size with greater than 50%  respiratory variability, suggesting right atrial pressure of 3 mmHg.     Assessment and Plan:  1. Persistent afib Persistent for at least 6 months , rate controlled with minimal symptoms Options of rate controlled afib vrs amiodarone vrs Tikosyn discussed with pt He loaded on tikosyn 06/2021  He is now here for tikosyn admission  No benadryl use Qtc acceptable  Bmet/mag today   2. CHA2DS2VASc  score of  at least 3 Continue eliquis 5 mg bid   F/u with Dr. Curt Bears in 6 months   Geroge Baseman. Dean [name], [facility] [address] [place],  [address] [phone]

## 2022-02-10 ENCOUNTER — Other Ambulatory Visit (HOSPITAL_COMMUNITY): Payer: Self-pay | Admitting: Physician Assistant

## 2022-02-28 ENCOUNTER — Other Ambulatory Visit: Payer: Self-pay | Admitting: Cardiovascular Disease

## 2022-03-01 NOTE — Telephone Encounter (Signed)
Pt last saw Roderic Palau, NP on 02/02/22, last labs 02/02/22 Creat 1.32, age 76, weight 82.9kg, based on specified criteria pt is on appropriate dosage of Eliquis '5mg'$  BID for afib.  Will refill rx.  ?

## 2022-03-02 ENCOUNTER — Other Ambulatory Visit: Payer: Self-pay

## 2022-03-02 ENCOUNTER — Ambulatory Visit: Payer: Medicare Other | Admitting: Pharmacist

## 2022-03-02 VITALS — BP 132/68 | HR 47

## 2022-03-02 DIAGNOSIS — I10 Essential (primary) hypertension: Secondary | ICD-10-CM

## 2022-03-02 DIAGNOSIS — M1A0790 Idiopathic chronic gout, unspecified ankle and foot, without tophus (tophi): Secondary | ICD-10-CM | POA: Diagnosis not present

## 2022-03-02 DIAGNOSIS — E782 Mixed hyperlipidemia: Secondary | ICD-10-CM | POA: Diagnosis not present

## 2022-03-02 LAB — BASIC METABOLIC PANEL WITH GFR
BUN/Creatinine Ratio: 15 (ref 10–24)
BUN: 19 mg/dL (ref 8–27)
CO2: 26 mmol/L (ref 20–29)
Calcium: 9.2 mg/dL (ref 8.6–10.2)
Chloride: 105 mmol/L (ref 96–106)
Creatinine, Ser: 1.25 mg/dL (ref 0.76–1.27)
Glucose: 98 mg/dL (ref 70–99)
Potassium: 4.7 mmol/L (ref 3.5–5.2)
Sodium: 140 mmol/L (ref 134–144)
eGFR: 60 mL/min/1.73

## 2022-03-02 LAB — LIPID PANEL
Chol/HDL Ratio: 2.9 ratio (ref 0.0–5.0)
Cholesterol, Total: 138 mg/dL (ref 100–199)
HDL: 48 mg/dL
LDL Chol Calc (NIH): 72 mg/dL (ref 0–99)
Triglycerides: 94 mg/dL (ref 0–149)
VLDL Cholesterol Cal: 18 mg/dL (ref 5–40)

## 2022-03-02 LAB — URIC ACID: Uric Acid: 5.1 mg/dL (ref 3.8–8.4)

## 2022-03-02 NOTE — Patient Instructions (Addendum)
It was nice to see you today! ? ?Your blood pressure is at goal < 130/87mHg ? ?Continue taking your current medications ? ?Call MJinny Blossom PharmD with any concerns #[phone]? ?

## 2022-03-02 NOTE — Progress Notes (Addendum)
Patient ID: Dean [name]                 DOB: 1946/05/10                      MRN: [id number] ? ? ? ? ?HPI: ?Dean [name] is a 76 y.o. male referred by Dr. Acie Fredrickson to HTN clinic. PMH is significant for CAD s/p NSTEMI and CABG in 2018, post op afib, CVA (prior to afib dx), HLD, gout, and GERD. I have previously followed pt in lipid clinic. At most recent visit with Dr Acie Fredrickson 11/09/21, BP was elevated at 138/80 and pt was started on losartan '50mg'$  daily with PharmD referral. F/u BMET was stable. I saw pt on 01/25/22 and increased losartan to '100mg'$  daily as 45% of home readings were at goal. ? ?Pt presents today for follow up. Has checked BP daily using a home Omron bicep cuff that he's had for 3-4 years. Home readings have improved since increasing dose of losartan. Has had 4 dizzy spells when walking since dose increase, first 3 were shortly after dose increase, then had a shorter spell yesterday that the was the first one in about 2 weeks. Have only occurred when he's walking up hill. Prefers not to take medications if he doesn't need them. Stays active and eats a heart healthy diet. ? ?Home omron bicep cuff reading - 138/81, 124/79 ?Clinic manual reading - 132/68 ? ?Current HTN meds: losartan '100mg'$  daily (~3pm) ? ?BP goal: <130/41mHg ? ?Family History: Alzheimer's disease in his maternal grandmother and mother; Hyperlipidemia in his brother and mother ? ?Social History: The patient  reports that he has quit smoking. He has never used smokeless tobacco. He reports that he does not currently use alcohol. He reports that he does not use drugs.  ? ?Diet: no fast food, fried food or soda. Rarely eats red meat. Eats whole grains. Minimal dairy. 2 cups of coffee and oatmeal in the AM.  ? ?Exercise: walks 4 miles most days a week for an hour ? ?Home BP readings:  ? ? ? ?Wt Readings from Last 3 Encounters:  ?02/02/22 182 lb 12.8 oz (82.9 kg)  ?11/09/21 183 lb 9.6 oz (83.3 kg)  ?10/19/21 175 lb (79.4 kg)  ? ?BP Readings from  Last 3 Encounters:  ?02/02/22 128/64  ?01/25/22 (!) 142/80  ?11/09/21 138/80  ? ?Pulse Readings from Last 3 Encounters:  ?02/02/22 60  ?01/25/22 (!) 50  ?11/09/21 63  ? ? ?Renal function: ?CrCl cannot be calculated (Patient's most recent lab result is older than the maximum 21 days allowed.). ? ?Past Medical History:  ?Diagnosis Date  ? Acid reflux   ? history of  ? Anemia   ? Atrial fibrillation ([place] 10/2017  ? RVR  ? Coronary artery disease   ? Diverticulitis 11/26/2017  ? Gout   ? Hepatitis A age 76 ? History of kidney stones   ? Hyperlipemia   ? Hypogonadism male   ? Irregular heart beat   ? Ischemic cardiomyopathy   ? Non compliance with medical treatment   ? Non-STEMI (non-ST elevated myocardial infarction) ([facility] 10/2017  ? Pelvic abscess in male ([facility]   ? percutaneous drain placement  ? ? ?Current Outpatient Medications on File Prior to Visit  ?Medication Sig Dispense Refill  ? allopurinol (ZYLOPRIM) 100 MG tablet Take 100 mg by mouth daily.     ? atorvastatin (LIPITOR) 10 MG tablet TAKE 1 TABLET BY MOUTH  DAILY 90  tablet 3  ? dofetilide (TIKOSYN) 500 MCG capsule TAKE 1 CAPSULE BY MOUTH  TWICE DAILY 180 capsule 3  ? ELIQUIS 5 MG TABS tablet TAKE 1 TABLET BY MOUTH  TWICE DAILY 180 tablet 3  ? ezetimibe (ZETIA) 10 MG tablet TAKE ONE-HALF TABLET BY  MOUTH DAILY 45 tablet 3  ? losartan (COZAAR) 100 MG tablet Take 1 tablet (100 mg total) by mouth daily. 90 tablet 3  ? MAGNESIUM-OXIDE 400 (240 Mg) MG tablet TAKE 1 TABLET(400 MG) BY MOUTH DAILY 90 tablet 2  ? ?No current facility-administered medications on file prior to visit.  ? ? ?Allergies  ?Allergen Reactions  ? Crestor [Rosuvastatin Calcium]   ?  Reported intolerance to Lipitor in the past ?Crestor '40mg'$  patient reported muscle aches, dose reduced to rechallenge  ? ? ? ?Assessment/Plan: ? ?1. Hypertension - BP has improved and is at goal < 130/41mHg in office and with majority of home readings. Pt has had a few dizzy spells, more so shortly after dose  increase of losartan. Discussed decreasing dose to '75mg'$  daily, pt prefers to continue at current dose of '100mg'$  daily as home BP readings are controlled but haven't been low. He'll call clinic if dizzy spells reoccur or are bothersome, would decrease dose to '75mg'$  daily at that time. Avoiding thiazides due to DI with his Tikosyn, avoiding beta blockers with his baseline bradycardia. ? ?2. Hyperlipidemia - Discussed more aggressive LDL goal of < 55 compared to < 70 (hx of CAD + CVA). LDL most recently 83 on max tolerated statin dose of atorvastatin '10mg'$  daily + ezeitmibe '5mg'$  daily. Previously intolerant to atorvastatin '20mg'$  daily and rosuvastatin '10mg'$  daily (knee pain). Discussed addition of PCSK9i at last visit, pt preferred to have labs rechecked. He's fasting today, will check lipids along with uric acid per pt request. ? ?Silveria Botz E. Kimbely Whiteaker, PharmD, BCACP, CPP ?[id number] [place] [id number]?Phone: [phone] Fax: [phone]?03/02/2022 7:30 AM ? ? ?

## 2022-03-13 ENCOUNTER — Encounter: Payer: Self-pay | Admitting: Cardiovascular Disease

## 2022-03-15 MED ORDER — DOFETILIDE 500 MCG PO CAPS: 500.0000 ug | ORAL_CAPSULE | Freq: Two times a day (BID) | ORAL | 3 refills | Status: DC

## 2022-04-09 ENCOUNTER — Encounter: Payer: Self-pay | Admitting: Cardiovascular Disease

## 2022-04-09 ENCOUNTER — Other Ambulatory Visit (HOSPITAL_COMMUNITY): Payer: Self-pay

## 2022-04-09 MED ORDER — DOFETILIDE 500 MCG PO CAPS
500.0000 ug | ORAL_CAPSULE | Freq: Two times a day (BID) | ORAL | 3 refills | Status: DC
  Filled 2022-04-09 – 2022-04-12 (×2): qty 180, 90d supply, fill #0

## 2022-04-12 ENCOUNTER — Other Ambulatory Visit (HOSPITAL_COMMUNITY): Payer: Self-pay

## 2022-04-13 ENCOUNTER — Other Ambulatory Visit (HOSPITAL_COMMUNITY): Payer: Self-pay

## 2022-07-31 ENCOUNTER — Other Ambulatory Visit: Payer: Self-pay | Admitting: Cardiovascular Disease

## 2022-08-09 ENCOUNTER — Other Ambulatory Visit: Payer: Self-pay | Admitting: Cardiovascular Disease

## 2022-08-21 ENCOUNTER — Other Ambulatory Visit (HOSPITAL_COMMUNITY): Payer: Self-pay | Admitting: Cardiology

## 2022-08-23 ENCOUNTER — Other Ambulatory Visit: Payer: Self-pay | Admitting: *Deleted

## 2022-08-23 MED ORDER — MAGNESIUM OXIDE -MG SUPPLEMENT 400 (240 MG) MG PO TABS: ORAL_TABLET | ORAL | 2 refills | Status: DC

## 2022-10-08 ENCOUNTER — Other Ambulatory Visit: Payer: Self-pay | Admitting: Cardiovascular Disease

## 2022-10-21 ENCOUNTER — Ambulatory Visit (INDEPENDENT_AMBULATORY_CARE_PROVIDER_SITE_OTHER): Payer: Medicare Other | Admitting: *Deleted

## 2022-10-21 VITALS — BP 122/74 | HR 63 | Ht 69.5 in | Wt 172.0 lb

## 2022-10-21 DIAGNOSIS — Z0000 Encounter for general adult medical examination without abnormal findings: Secondary | ICD-10-CM

## 2022-10-21 NOTE — Patient Instructions (Signed)
Dean [name] , Thank you for taking time to come for your Medicare Wellness Visit. I appreciate your ongoing commitment to your health goals. Please review the following plan we discussed and let me know if I can assist you in the future.   These are the goals we discussed:  Goals      Patient Stated     Keep blood pressure under control & maintain current healthy lifestyle        This is a list of the screening recommended for you and due dates:  Health Maintenance  Topic Date Due   Hepatitis C Screening: USPSTF Recommendation to screen - Ages 52-79 yo.  Never done   Tetanus Vaccine  Never done   Pneumonia Vaccine (1 - PCV) Never done   Zoster (Shingles) Vaccine (2 of 2) 04/28/2021   COVID-19 Vaccine (5 - Pfizer risk series) 11/12/2021   Flu Shot  Never done   Medicare Annual Wellness Visit  10/22/2023   Colon Cancer Screening  11/21/2023   HPV Vaccine  Aged Out     Next appointment: Follow up in one year for your annual wellness visit.   Preventive Care 13 Years and Older, Male Preventive care refers to lifestyle choices and visits with your health care provider that can promote health and wellness. What does preventive care include? A yearly physical exam. This is also called an annual well check. Dental exams once or twice a year. Routine eye exams. Ask your health care provider how often you should have your eyes checked. Personal lifestyle choices, including: Daily care of your teeth and gums. Regular physical activity. Eating a healthy diet. Avoiding tobacco and drug use. Limiting alcohol use. Practicing safe sex. Taking low doses of aspirin every day. Taking vitamin and mineral supplements as recommended by your health care provider. What happens during an annual well check? The services and screenings done by your health care provider during your annual well check will depend on your age, overall health, lifestyle risk factors, and family history of  disease. Counseling  Your health care provider may ask you questions about your: Alcohol use. Tobacco use. Drug use. Emotional well-being. Home and relationship well-being. Sexual activity. Eating habits. History of falls. Memory and ability to understand (cognition). Work and work Statistician. Screening  You may have the following tests or measurements: Height, weight, and BMI. Blood pressure. Lipid and cholesterol levels. These may be checked every 5 years, or more frequently if you are over 51 years old. Skin check. Lung cancer screening. You may have this screening every year starting at age 66 if you have a 30-pack-year history of smoking and currently smoke or have quit within the past 15 years. Fecal occult blood test (FOBT) of the stool. You may have this test every year starting at age 26. Flexible sigmoidoscopy or colonoscopy. You may have a sigmoidoscopy every 5 years or a colonoscopy every 10 years starting at age 74. Prostate cancer screening. Recommendations will vary depending on your family history and other risks. Hepatitis C blood test. Hepatitis B blood test. Sexually transmitted disease (STD) testing. Diabetes screening. This is done by checking your blood sugar (glucose) after you have not eaten for a while (fasting). You may have this done every 1-3 years. Abdominal aortic aneurysm (AAA) screening. You may need this if you are a current or former smoker. Osteoporosis. You may be screened starting at age 61 if you are at high risk. Talk with your health care provider about your  test results, treatment options, and if necessary, the need for more tests. Vaccines  Your health care provider may recommend certain vaccines, such as: Influenza vaccine. This is recommended every year. Tetanus, diphtheria, and acellular pertussis (Tdap, Td) vaccine. You may need a Td booster every 10 years. Zoster vaccine. You may need this after age 56. Pneumococcal 13-valent  conjugate (PCV13) vaccine. One dose is recommended after age 27. Pneumococcal polysaccharide (PPSV23) vaccine. One dose is recommended after age 59. Talk to your health care provider about which screenings and vaccines you need and how often you need them. This information is not intended to replace advice given to you by your health care provider. Make sure you discuss any questions you have with your health care provider. Document Released: 12/19/2015 Document Revised: 08/11/2016 Document Reviewed: 09/23/2015 Elsevier Interactive Patient Education  2017 [employer] Prevention in the Home Falls can cause injuries. They can happen to people of all ages. There are many things you can do to make your home safe and to help prevent falls. What can I do on the outside of my home? Regularly fix the edges of walkways and driveways and fix any cracks. Remove anything that might make you trip as you walk through a door, such as a raised step or threshold. Trim any bushes or trees on the path to your home. Use bright outdoor lighting. Clear any walking paths of anything that might make someone trip, such as rocks or tools. Regularly check to see if handrails are loose or broken. Make sure that both sides of any steps have handrails. Any raised decks and porches should have guardrails on the edges. Have any leaves, snow, or ice cleared regularly. Use sand or salt on walking paths during winter. Clean up any spills in your garage right away. This includes oil or grease spills. What can I do in the bathroom? Use night lights. Install grab bars by the toilet and in the tub and shower. Do not use towel bars as grab bars. Use non-skid mats or decals in the tub or shower. If you need to sit down in the shower, use a plastic, non-slip stool. Keep the floor dry. Clean up any water that spills on the floor as soon as it happens. Remove soap buildup in the tub or shower regularly. Attach bath mats  securely with double-sided non-slip rug tape. Do not have throw rugs and other things on the floor that can make you trip. What can I do in the bedroom? Use night lights. Make sure that you have a light by your bed that is easy to reach. Do not use any sheets or blankets that are too big for your bed. They should not hang down onto the floor. Have a firm chair that has side arms. You can use this for support while you get dressed. Do not have throw rugs and other things on the floor that can make you trip. What can I do in the kitchen? Clean up any spills right away. Avoid walking on wet floors. Keep items that you use a lot in easy-to-reach places. If you need to reach something above you, use a strong step stool that has a grab bar. Keep electrical cords out of the way. Do not use floor polish or wax that makes floors slippery. If you must use wax, use non-skid floor wax. Do not have throw rugs and other things on the floor that can make you trip. What can I do with my  stairs? Do not leave any items on the stairs. Make sure that there are handrails on both sides of the stairs and use them. Fix handrails that are broken or loose. Make sure that handrails are as long as the stairways. Check any carpeting to make sure that it is firmly attached to the stairs. Fix any carpet that is loose or worn. Avoid having throw rugs at the top or bottom of the stairs. If you do have throw rugs, attach them to the floor with carpet tape. Make sure that you have a light switch at the top of the stairs and the bottom of the stairs. If you do not have them, ask someone to add them for you. What else can I do to help prevent falls? Wear shoes that: Do not have high heels. Have rubber bottoms. Are comfortable and fit you well. Are closed at the toe. Do not wear sandals. If you use a stepladder: Make sure that it is fully opened. Do not climb a closed stepladder. Make sure that both sides of the stepladder  are locked into place. Ask someone to hold it for you, if possible. Clearly mark and make sure that you can see: Any grab bars or handrails. First and last steps. Where the edge of each step is. Use tools that help you move around (mobility aids) if they are needed. These include: Canes. Walkers. Scooters. Crutches. Turn on the lights when you go into a dark area. Replace any light bulbs as soon as they burn out. Set up your furniture so you have a clear path. Avoid moving your furniture around. If any of your floors are uneven, fix them. If there are any pets around you, be aware of where they are. Review your medicines with your doctor. Some medicines can make you feel dizzy. This can increase your chance of falling. Ask your doctor what other things that you can do to help prevent falls. This information is not intended to replace advice given to you by your health care provider. Make sure you discuss any questions you have with your health care provider. Document Released: 09/18/2009 Document Revised: 04/29/2016 Document Reviewed: 12/27/2014 Elsevier Interactive Patient Education  2017 [employer].

## 2022-10-21 NOTE — Progress Notes (Addendum)
Subjective:   Dean [name] is a 76 y.o. male who presents for Medicare Annual/Subsequent preventive examination.  I connected with  Kuba Shepherd on 10/21/22 by a audio enabled telemedicine application and verified that I am speaking with the correct person using two identifiers.  Patient Location: Home  Provider Location: Office/Clinic  I discussed the limitations of evaluation and management by telemedicine. The patient expressed understanding and agreed to proceed.   Review of Systems    Defer to PCP Cardiac Risk Factors include: male gender;hypertension;dyslipidemia;advanced age (>44mn, >>71women)     Objective:    Today's Vitals   10/21/22 0908  BP: 122/74  Pulse: 63   There is no height or weight on file to calculate BMI.     10/21/2022    9:18 AM 10/19/2021    9:07 AM 06/16/2021   11:57 AM 02/19/2018    7:00 PM 02/10/2018   11:33 AM 11/26/2017    2:00 PM 11/26/2017    6:48 AM  Advanced Directives  Does Patient Have a Medical Advance Directive? Yes Yes Yes Yes Yes No No  Type of AParamedicof AOmahaLiving will HOak Grove VillageLiving will HCottonwoodLiving will HPalos Verdes EstatesLiving will HGeraldineLiving will    Does patient want to make changes to medical advance directive? No - Patient declined  No - Patient declined No - Patient declined     Copy of HCliffdellin Chart? No - copy requested No - copy requested No - copy requested      Would patient like information on creating a medical advance directive?      No - Patient declined     Current Medications (verified) Outpatient Encounter Medications as of 10/21/2022  Medication Sig   allopurinol (ZYLOPRIM) 100 MG tablet Take 100 mg by mouth daily.    atorvastatin (LIPITOR) 10 MG tablet TAKE 1 TABLET BY MOUTH DAILY   dofetilide (TIKOSYN) 500 MCG capsule Take 1 capsule (500 mcg total) by mouth 2 (two)  times daily.   ELIQUIS 5 MG TABS tablet TAKE 1 TABLET BY MOUTH  TWICE DAILY   ezetimibe (ZETIA) 10 MG tablet TAKE ONE-HALF TABLET BY  MOUTH DAILY   losartan (COZAAR) 100 MG tablet TAKE 1 TABLET BY MOUTH DAILY   MAGnesium-Oxide 400 (240 Mg) MG tablet TAKE 1 TABLET(400 MG) BY MOUTH DAILY   [DISCONTINUED] magnesium oxide (MAG-OX) 400 (240 Mg) MG tablet TAKE 1 TABLET(400 MG) BY MOUTH DAILY   No facility-administered encounter medications on file as of 10/21/2022.    Allergies (verified) Crestor [rosuvastatin calcium]   History: Past Medical History:  Diagnosis Date   Acid reflux    history of   Anemia    [facility]   RVR   Coronary artery disease    Diverticulitis 11/26/2017   Gout    Hepatitis A age 76  History of kidney stones    Hyperlipemia    Hypogonadism male    Irregular heart beat    Ischemic cardiomyopathy    Non compliance with medical treatment    [place]ST [place]   [facility]el[facility]    percutaneous drain placement   Past Surgical History:  Procedure Laterality Date   BLADDER REPAIR  02/17/2018   Procedure: BLADDER REPAIR;  Surgeon: WIleana Roup MD;  [facility];  Service: General;;   BLADDER REPAIR  02/17/2018   Procedure:  pelvic exploration, assist with bladder repair ;  Surgeon: Raynelle Bring, MD;  [facility];  Service: Urology;;   COLONOSCOPY  ~ 2014   Dr Ferdinand Lango in [facility].  Diverticulosis.  pt denies colon polyps.     CORONARY ARTERY BYPASS GRAFT N/A 10/26/2017   Procedure: CORONARY ARTERY BYPASS GRAFTING (CABG) x four , using left internal mammary artery and bilateral legs greater saphenous vein harvested endoscopically;  Surgeon: Gaye Pollack, MD;  Location: [place] OR;  Service: Open Heart Surgery;  Laterality: N/A;   CYSTOSCOPY W/ URETERAL STENT PLACEMENT Bilateral 02/17/2018   Procedure: CYSTOSCOPY WITH RETROGRADE PYELOGRAM/URETERAL STENT PLACEMENT;   Surgeon: Ardis Hughs, MD;  [facility];  Service: Urology;  Laterality: Bilateral;   IR CATHETER TUBE CHANGE  01/27/2018   IR RADIOLOGIST EVAL & MGMT  12/21/2017   LAPAROSCOPIC SIGMOID COLECTOMY N/A 02/17/2018   Procedure: LAPAROSCOPIC SIGMOID COLECTOMY  ERAS PATHWAY TAKEDOWN OF COLOVESSICAL COLOCUTANEOUS FISTULA;  Surgeon: Ileana Roup, MD;  [facility];  Service: General;  Laterality: N/A;   LEFT HEART CATH AND CORONARY ANGIOGRAPHY N/A 10/25/2017   Procedure: LEFT HEART CATH AND CORONARY ANGIOGRAPHY;  Surgeon: Leonie Man, MD;  Location: [place] [facility];  Service: Cardiovascular;  Laterality: N/A;   SIGMOIDOSCOPY N/A 02/17/2018   Procedure: FLEX SIGMOIDOSCOPY;  Surgeon: Ileana Roup, MD;  [facility];  Service: General;  Laterality: N/A;   TEE WITHOUT CARDIOVERSION N/A 10/26/2017   Procedure: TRANSESOPHAGEAL ECHOCARDIOGRAM (TEE);  Surgeon: Gaye Pollack, MD;  Location: [place];  Service: Open Heart Surgery;  Laterality: N/A;   Family History  Problem Relation Age of Onset   Hyperlipidemia Mother    Alzheimer's disease Mother    Hyperlipidemia Brother    Alzheimer's disease Maternal Grandmother    Colon cancer Neg Hx    Esophageal cancer Neg Hx    Stomach cancer Neg Hx    Social History   Socioeconomic History   Marital status: Single    Spouse name: Not on file   Number of children: Not on file   Years of education: Not on file   Highest education level: Not on file  Occupational History   Occupation: retired  Tobacco Use   Smoking status: Former   Smokeless tobacco: Never  Scientific laboratory technician Use: Never used  Substance and Sexual Activity   Alcohol use: Not Currently   Drug use: No   Sexual activity: Not on file  Other Topics Concern   Not on file  Social History Narrative   Not on file   Social Determinants of Health   Financial Resource Strain: [place]  (10/19/2021)   Overall Financial Resource Strain (CARDIA)     Difficulty of Paying Living Expenses: Not hard at all  Food Insecurity: No Food Insecurity (10/21/2022)   Hunger Vital Sign    Worried About Running Out of Food in the Last Year: Never true    [place] in the Last Year: Never true  Transportation Needs: No Transportation Needs (10/21/2022)   PRAPARE - Hydrologist (Medical): No    Lack of Transportation (Non-Medical): No  Physical Activity: Sufficiently Active (10/21/2022)   Exercise Vital Sign    Days of Exercise per Week: 6 days    Minutes of Exercise per Session: 60 min  Stress: No Stress Concern Present (10/19/2021)   [place]    Feeling of  Stress : Not at all  Social Connections: Socially Isolated (10/19/2021)   Social Connection and Isolation Panel [NHANES]    Frequency of Communication with Friends and Family: More than three times a week    Frequency of Social Gatherings with Friends and Family: Once a week    Attends Religious Services: Never    Marine scientist or Organizations: No    Attends Music therapist: Never    Marital Status: Never married    Tobacco Counseling Counseling given: Not Answered   Clinical Intake:  Pre-visit preparation completed: Yes  Pain : No/denies pain  Diabetes: No  How often do you need to have someone help you when you read instructions, pamphlets, or other written materials from your doctor or pharmacy?: 1 - Never   Activities of Daily Living    10/21/2022    9:20 AM  In your present state of health, do you have any difficulty performing the following activities:  Hearing? 0  Vision? 0  Difficulty concentrating or making decisions? 0  Walking or climbing stairs? 0  Dressing or bathing? 0  Doing errands, shopping? 0  Preparing Food and eating ? N  Using the Toilet? N  In the past six months, have you accidently leaked urine? N  Do you have problems with  loss of bowel control? N  Managing your Medications? N  Managing your Finances? N  Housekeeping or managing your Housekeeping? N    Patient Care Team: Saguier, Iris Pert as PCP - General (Internal Medicine) Nahser, Wonda Cheng, MD as PCP - Cardiology (Cardiology) Nahser, Wonda Cheng, MD as Consulting Physician (Cardiology) Gaye Pollack, MD as Consulting Physician (Cardiothoracic Surgery) Ladene Artist, MD as Consulting Physician (Gastroenterology)  Indicate any recent Medical Services you may have received from other than [facility] providers in the past year (date may be approximate).     Assessment:   This is a routine wellness examination for [place].  Hearing/Vision screen No results found.  Dietary issues and exercise activities discussed: Current Exercise Habits: Home exercise routine, Type of exercise: walking, Time (Minutes): 60, Frequency (Times/Week): 6, Weekly Exercise (Minutes/Week): 360, Intensity: Moderate, Exercise limited by: None identified   Goals Addressed             This Visit's Progress    Patient Stated   On track    Keep blood pressure under control & maintain current healthy lifestyle       Depression Screen    10/21/2022    9:19 AM 10/19/2021    9:13 AM 11/27/2017    3:07 AM  PHQ 2/9 Scores  PHQ - 2 Score 0 0 0    Fall Risk    10/21/2022    9:19 AM 10/19/2021    9:10 AM  Fall Risk   Falls in the past year? 0 0  Number falls in past yr: 0 0  Injury with Fall? 0 0  Risk for fall due to : No Fall Risks   Follow up Falls evaluation completed Falls prevention discussed    [place]:  Any stairs in or around the home? Yes  If so, are there any without handrails? No  Home free of loose throw rugs in walkways, pet beds, electrical cords, etc? No  Adequate lighting in your home to reduce risk of falls? No   ASSISTIVE DEVICES UTILIZED TO PREVENT FALLS:  Life alert? No  Use of a cane, walker or w/c? No  Grab bars in the bathroom? No  Shower chair or bench in shower? No  Elevated toilet seat or a handicapped toilet? Yes   TIMED UP AND GO:  Was the test performed?  No, audio visit .    Cognitive Function:        10/21/2022    9:26 AM  6CIT Screen  What Year? 0 points  What month? 0 points  What time? 0 points  Count back from 20 0 points  Months in reverse 0 points  Repeat phrase 2 points  Total Score 2 points    Immunizations Immunization History  Administered Date(s) Administered   PFIZER(Purple Top)SARS-COV-2 Vaccination 01/19/2020, 02/13/2020, 10/22/2020   Pfizer Covid-19 Vaccine Bivalent Booster 29yr & up 09/17/2021   Zoster Recombinat (Shingrix) 03/03/2021    TDAP status: Due, Education has been provided regarding the importance of this vaccine. Advised may receive this vaccine at local pharmacy or Health Dept. Aware to provide a copy of the vaccination record if obtained from local pharmacy or Health Dept. Verbalized acceptance and understanding.  Flu Vaccine status: Due, Education has been provided regarding the importance of this vaccine. Advised may receive this vaccine at local pharmacy or Health Dept. Aware to provide a copy of the vaccination record if obtained from local pharmacy or Health Dept. Verbalized acceptance and understanding.  Pneumococcal vaccine status: Due, Education has been provided regarding the importance of this vaccine. Advised may receive this vaccine at local pharmacy or Health Dept. Aware to provide a copy of the vaccination record if obtained from local pharmacy or Health Dept. Verbalized acceptance and understanding.  Covid-19 vaccine status: Information provided on how to obtain vaccines.   Qualifies for Shingles Vaccine? Yes   Zostavax completed No   Shingrix Completed?: No.    Education has been provided regarding the importance of this vaccine. Patient has been advised to call insurance company to determine out of pocket expense  if they have not yet received this vaccine. Advised may also receive vaccine at local pharmacy or Health Dept. Verbalized acceptance and understanding.  Screening Tests Health Maintenance  Topic Date Due   Hepatitis C Screening  Never done   TETANUS/TDAP  Never done   Pneumonia Vaccine 74 Years old (1 - PCV) Never done   Zoster Vaccines- Shingrix (2 of 2) 04/28/2021   COVID-19 Vaccine (5 - Pfizer risk series) 11/12/2021   INFLUENZA VACCINE  Never done   Medicare Annual Wellness (AWV)  10/22/2023   COLONOSCOPY (Pts 45-473yrInsurance coverage will need to be confirmed)  11/21/2023   HPV VACCINES  Aged Out    Health Maintenance  Health Maintenance Due  Topic Date Due   Hepatitis C Screening  Never done   TETANUS/TDAP  Never done   Pneumonia Vaccine [age]Years old (1 - PCV) Never done   Zoster Vaccines- Shingrix (2 of 2) 04/28/2021   COVID-19 Vaccine (5 - Pfizer risk series) 11/12/2021   INFLUENZA VACCINE  Never done    Colorectal cancer screening: Type of screening: Colonoscopy. Completed 11/20/13. Repeat every 10 years  Lung Cancer Screening: (Low Dose CT Chest recommended if Age 74-80ears, 30 pack-year currently smoking OR have quit w/in 15years.) does not qualify.    Additional Screening:  Hepatitis C Screening: does qualify  Vision Screening: Recommended annual ophthalmology exams for early detection of glaucoma and other disorders of the eye. Is the patient up to date with their annual eye exam?  Yes  Who is the provider or what is  the name of the office in which the patient attends annual eye exams? Current eye doctor has relocated so pt will get a new one in March If pt is not established with a provider, would they like to be referred to a provider to establish care? No .   Dental Screening: Recommended annual dental exams for proper oral hygiene  Community Resource Referral / Chronic Care Management: CRR required this visit?  No   CCM required this visit?  No       Plan:     I have personally reviewed and noted the following in the patient's chart:   Medical and social history Use of alcohol, tobacco or illicit drugs  Current medications and supplements including opioid prescriptions. Patient is not currently taking opioid prescriptions. Functional ability and status Nutritional status Physical activity Advanced directives List of other physicians Hospitalizations, surgeries, and ER visits in previous 12 months Vitals Screenings to include cognitive, depression, and falls Referrals and appointments  In addition, I have reviewed and discussed with patient certain preventive protocols, quality metrics, and best practice recommendations. A written personalized care plan for preventive services as well as general preventive health recommendations were provided to patient.   Due to this being a telephonic visit, the after visit summary with patients personalized plan was offered to patient via mail or my-chart. Patient would like to access on my-chart.  Beatris Ship, [place]   10/21/2022   Nurse Notes: None  Review and Agree with assessment & plan of CMA.  Mackie Pai, PA-C

## 2022-10-27 ENCOUNTER — Other Ambulatory Visit: Payer: Self-pay | Admitting: Cardiovascular Disease

## 2022-11-03 ENCOUNTER — Other Ambulatory Visit: Payer: Self-pay | Admitting: Cardiovascular Disease

## 2022-11-16 ENCOUNTER — Encounter: Payer: Self-pay | Admitting: Cardiovascular Disease

## 2022-11-17 ENCOUNTER — Telehealth: Payer: Self-pay | Admitting: Cardiovascular Disease

## 2022-11-17 DIAGNOSIS — Z79899 Other long term (current) drug therapy: Secondary | ICD-10-CM

## 2022-11-17 DIAGNOSIS — E782 Mixed hyperlipidemia: Secondary | ICD-10-CM

## 2022-11-17 NOTE — Telephone Encounter (Signed)
  Pt would like to get lab work before seeing Dr. Acie Fredrickson. No order on file

## 2022-11-19 NOTE — Telephone Encounter (Signed)
Returned call to patient, but no answer. Left detailed message that we have placed orders for BMET, Lipids, ALT, LP(a) and scheduled for 12/21/22 since he will be coming to office for an appt with [facility]. Advised to call back if he has any questions or issues.

## 2022-11-23 ENCOUNTER — Other Ambulatory Visit: Payer: Self-pay | Admitting: Cardiovascular Disease

## 2022-11-23 DIAGNOSIS — I480 Paroxysmal atrial fibrillation: Secondary | ICD-10-CM

## 2022-11-24 ENCOUNTER — Ambulatory Visit: Payer: Medicare Other | Admitting: Cardiovascular Disease

## 2022-11-24 NOTE — Telephone Encounter (Signed)
Prescription refill request for Eliquis received. Indication: Afib  Last office visit: 02/02/22 Kayleen Memos)  Scr: 1.25 (03/02/22)  Age: 76 Weight: 78kg  Appropriate dose and refill sent to requested pharmacy.

## 2022-12-20 ENCOUNTER — Other Ambulatory Visit: Payer: Self-pay | Admitting: Cardiovascular Disease

## 2022-12-21 ENCOUNTER — Ambulatory Visit: Payer: Medicare Other

## 2022-12-21 ENCOUNTER — Ambulatory Visit: Payer: Medicare Other | Attending: Cardiology | Admitting: Cardiology

## 2022-12-21 ENCOUNTER — Encounter: Payer: Self-pay | Admitting: Cardiology

## 2022-12-21 VITALS — BP 100/66 | HR 47 | Ht 69.5 in | Wt 179.0 lb

## 2022-12-21 DIAGNOSIS — D6869 Other thrombophilia: Secondary | ICD-10-CM | POA: Diagnosis not present

## 2022-12-21 DIAGNOSIS — M1000 Idiopathic gout, unspecified site: Secondary | ICD-10-CM

## 2022-12-21 DIAGNOSIS — I10 Essential (primary) hypertension: Secondary | ICD-10-CM

## 2022-12-21 DIAGNOSIS — Z79899 Other long term (current) drug therapy: Secondary | ICD-10-CM

## 2022-12-21 DIAGNOSIS — E782 Mixed hyperlipidemia: Secondary | ICD-10-CM | POA: Diagnosis not present

## 2022-12-21 DIAGNOSIS — I2510 Atherosclerotic heart disease of native coronary artery without angina pectoris: Secondary | ICD-10-CM | POA: Diagnosis not present

## 2022-12-21 DIAGNOSIS — I4819 Other persistent atrial fibrillation: Secondary | ICD-10-CM

## 2022-12-21 LAB — CBC

## 2022-12-21 MED ORDER — LOSARTAN POTASSIUM 25 MG PO TABS: 25.0000 mg | ORAL_TABLET | Freq: Every day | ORAL | 1 refills | Status: DC

## 2022-12-21 NOTE — Progress Notes (Signed)
Electrophysiology Office Note   Date:  12/21/2022   ID:  Dean [name], DOB [birth year], MRN [id number]  PCP:  Dean Pai, PA-C  Cardiologist:  Dean [name] Primary Electrophysiologist:  Dean Copenhaver Meredith Leeds, MD    Chief Complaint: AF   History of Present Illness: Dean [name] is a 77 y.o. male who is being seen today for the evaluation of AF at the request of Saguier, Percell Miller, [place]. Presenting today for electrophysiology evaluation.  He has a history significant for persistent atrial fibrillation, hyperlipidemia, coronary artery disease.  He was admitted July 2022 and was loaded on dofetilide.  Today, denies symptoms of palpitations, chest pain, shortness of breath, orthopnea, PND, lower extremity edema, claudication, dizziness, presyncope, syncope, bleeding, or neurologic sequela. The patient is tolerating medications without difficulties.  He is in general feeling well.  He has no chest pain or shortness of breath.  He notes intermittent palpitations, but has not been able to tell if he is in atrial fibrillation.  He is happy with his atrial fibrillation control.  He does state that at times he gets dizzy when he stands.  His blood pressure is mildly reduced today.   Past Medical History:  Diagnosis Date   Acid reflux    history of   Anemia    Atrial fibrillation ([place]) 10/2017   RVR   Coronary artery disease    Diverticulitis 11/26/2017   Gout    Hepatitis A age 82   History of kidney stones    Hyperlipemia    Hypogonadism male    Irregular heart beat    Ischemic cardiomyopathy    Non compliance with medical treatment    Non-STEMI (non-ST elevated myocardial infarction) ([place]) 10/2017   Pelvic abscess in male [facility])    percutaneous drain placement   Past Surgical History:  Procedure Laterality Date   BLADDER REPAIR  02/17/2018   Procedure: BLADDER REPAIR;  Surgeon: Ileana Roup, MD;  [facility];  Service: General;;   BLADDER REPAIR  02/17/2018   Procedure:  pelvic exploration, assist with bladder repair ;  Surgeon: Raynelle Bring, MD;  [facility];  Service: Urology;;   COLONOSCOPY  ~ 2014   Dr Ferdinand Lango in [facility].  Diverticulosis.  pt denies colon polyps.     CORONARY ARTERY BYPASS GRAFT N/A 10/26/2017   Procedure: CORONARY ARTERY BYPASS GRAFTING (CABG) x four , using left internal mammary artery and bilateral legs greater saphenous vein harvested endoscopically;  Surgeon: Gaye Pollack, MD;  Location: [place] OR;  Service: Open Heart Surgery;  Laterality: N/A;   CYSTOSCOPY W/ URETERAL STENT PLACEMENT Bilateral 02/17/2018   Procedure: CYSTOSCOPY WITH RETROGRADE PYELOGRAM/URETERAL STENT PLACEMENT;  Surgeon: Ardis Hughs, MD;  [facility];  Service: Urology;  Laterality: Bilateral;   IR CATHETER TUBE CHANGE  01/27/2018   IR RADIOLOGIST EVAL & MGMT  12/21/2017   LAPAROSCOPIC SIGMOID COLECTOMY N/A 02/17/2018   Procedure: LAPAROSCOPIC SIGMOID COLECTOMY  ERAS PATHWAY TAKEDOWN OF COLOVESSICAL COLOCUTANEOUS FISTULA;  Surgeon: Ileana Roup, MD;  [facility];  Service: General;  Laterality: N/A;   LEFT HEART CATH AND CORONARY ANGIOGRAPHY N/A 10/25/2017   Procedure: LEFT HEART CATH AND CORONARY ANGIOGRAPHY;  Surgeon: Leonie Man, MD;  Location: [place] [facility] LAB;  Service: Cardiovascular;  Laterality: N/A;   SIGMOIDOSCOPY N/A 02/17/2018   Procedure: FLEX SIGMOIDOSCOPY;  Surgeon: Ileana Roup, MD;  [facility];  Service: General;  Laterality: N/A;   TEE WITHOUT CARDIOVERSION N/A 10/26/2017   Procedure:  TRANSESOPHAGEAL ECHOCARDIOGRAM (TEE);  Surgeon: Gaye Pollack, MD;  Location: [place];  Service: Open Heart Surgery;  Laterality: N/A;     Current Outpatient Medications  Medication Sig Dispense Refill   allopurinol (ZYLOPRIM) 100 MG tablet Take 100 mg by mouth daily.      atorvastatin (LIPITOR) 10 MG tablet TAKE 1 TABLET BY MOUTH  DAILY 100 tablet 2   dofetilide (TIKOSYN) 500 MCG capsule Take 1 capsule (500 mcg  total) by mouth 2 (two) times daily. 180 capsule 3   ELIQUIS 5 MG TABS tablet TAKE 1 TABLET BY MOUTH TWICE  DAILY 200 tablet 2   ezetimibe (ZETIA) 10 MG tablet TAKE ONE-HALF TABLET BY MOUTH  DAILY 45 tablet 0   losartan (COZAAR) 25 MG tablet Take 1 tablet (25 mg total) by mouth daily. 90 tablet 1   MAGnesium-Oxide 400 (240 Mg) MG tablet TAKE 1 TABLET(400 MG) BY MOUTH DAILY 90 tablet 2   No current facility-administered medications for this visit.    Allergies:   Crestor [rosuvastatin calcium]   Social History:  The patient  reports that he has quit smoking. He has never used smokeless tobacco. He reports that he does not currently use alcohol. He reports that he does not use drugs.   Family History:  The patient's family history includes Alzheimer's disease in his maternal grandmother and mother; Hyperlipidemia in his brother and mother.   ROS:  Please see the history of present illness.   Otherwise, review of systems is positive for none.   All other systems are reviewed and negative.   PHYSICAL EXAM: VS:  BP 100/66   Pulse (!) 47   Ht 5' 9.5" (1.765 m)   Wt 179 lb (81.2 kg)   SpO2 98%   BMI 26.05 kg/m  , BMI Body mass index is 26.05 kg/m. GEN: Well nourished, well developed, in no acute distress  HEENT: normal  Neck: no JVD, carotid bruits, or masses Cardiac: RRR; no murmurs, rubs, or gallops,no edema  Respiratory:  clear to auscultation bilaterally, normal work of breathing GI: soft, nontender, nondistended, + BS MS: no deformity or atrophy  Skin: warm and dry Neuro:  Strength and sensation are intact Psych: euthymic mood, full affect  EKG:  EKG is ordered today. Personal review of the ekg ordered shows sinus rhythm, IVCD, rate 47  Recent Labs: 02/02/2022: Hemoglobin 13.5; Magnesium 2.1; Platelets 142 03/02/2022: BUN 19; Creatinine, Ser 1.25; Potassium 4.7; Sodium 140    Lipid Panel     Component Value Date/Time   CHOL 138 03/02/2022 1018   TRIG 94 03/02/2022 1018    HDL 48 03/02/2022 1018   CHOLHDL 2.9 03/02/2022 1018   CHOLHDL 3.3 10/25/2017 0341   VLDL 21 10/25/2017 0341   LDLCALC 72 03/02/2022 1018     Wt Readings from Last 3 Encounters:  12/21/22 179 lb (81.2 kg)  10/21/22 172 lb (78 kg)  02/02/22 182 lb 12.8 oz (82.9 kg)      Other studies Reviewed: Additional studies/ records that were reviewed today include: TTE 03/24/20  Review of the above records today demonstrates:   1. Improvement of the LVEF noted as compared to echo from 2018. Segmental  wall motion involving lateral and inferior wall documented on echo from  2018 not seen on this study. Left ventricular ejection fraction, by  estimation, is 55 to 60%. The left  ventricle has normal function. The left ventricle has no regional wall  motion abnormalities. There is mild left ventricular hypertrophy.  Left  ventricular diastolic parameters were normal.   2. Right ventricular systolic function is normal. The right ventricular  size is normal. There is normal pulmonary artery systolic pressure.   3. The mitral valve is normal in structure. Moderate mitral valve  regurgitation. No evidence of mitral stenosis.   4. The aortic valve is normal in structure. Aortic valve regurgitation is  trivial. No aortic stenosis is present.   5. The inferior vena cava is normal in size with greater than 50%  respiratory variability, suggesting right atrial pressure of 3 mmHg.    ASSESSMENT AND PLAN:  1.  Persistent atrial fibrillation: Currently on Eliquis 5 mg twice daily, dofetilide 500 mcg twice daily.  CHA2DS2-VASc of at least 4.  He has remained in sinus rhythm.  QTc without abnormality.  2.  Coronary disease: Status post CABG.  No current chest pain  3.  Second hypercoagulable state: Currently on Eliquis for atrial fibrillation as above  4.  High risk medication monitoring: Currently on dofetilide as above for atrial fibrillation.  Shir Bergman check labs for monitoring below today.  5.   Hypertension: Blood pressure is low today.  Kaedin Hicklin cut losartan in half.  He has follow-up with his primary cardiologist next week for further evaluation.  Current medicines are reviewed at length with the patient today.   The patient does not have concerns regarding his medicines.  The following changes were made today: Reduce losartan  Labs/ tests ordered today include:  Orders Placed This Encounter  Procedures   Basic metabolic panel   CBC   Magnesium   Uric acid   EKG 12-Lead     Disposition:   FU 6 months  Signed, Gaven Eugene Meredith Leeds, MD  12/21/2022 10:19 AM     [facility] [address] [place] [address] [phone] (office) [phone] (fax)

## 2022-12-21 NOTE — Patient Instructions (Addendum)
Medication Instructions:  Your physician has recommended you make the following change in your medication:  DECREASE Losartan to 50 mg once daily  *If you need a refill on your cardiac medications before your next appointment, please call your pharmacy*   Lab Work: Today:  BMET, CBC, Magnesium & uric acid  If you have labs (blood work) drawn today and your tests are completely normal, you will receive your results only by: [place] (if you have MyChart) OR A paper copy in the mail If you have any lab test that is abnormal or we need to change your treatment, we will call you to review the results.   Testing/Procedures: None ordered   Follow-Up: At [facility], you and your health needs are our priority.  As part of our continuing mission to provide you with exceptional heart care, we have created designated Provider Care Teams.  These Care Teams include your primary Cardiologist (physician) and Advanced Practice Providers (APPs -  Physician Assistants and Nurse Practitioners) who all work together to provide you with the care you need, when you need it.   Your next appointment:   6 month(s)  The format for your next appointment:   In Person  Provider:   You will follow up in the [facility] located at [facility]. Your provider will be: Roderic Palau, NP or Clint R. Fenton, PA-C    Thank you for choosing [facility]!!   Trinidad Curet, RN [phone]  Other Instructions

## 2022-12-22 LAB — CBC
Hematocrit: 41.7 % (ref 37.5–51.0)
Hemoglobin: 14 g/dL (ref 13.0–17.7)
MCH: 32.3 pg (ref 26.6–33.0)
MCHC: 33.6 g/dL (ref 31.5–35.7)
MCV: 96 fL (ref 79–97)
Platelets: 136 10*3/uL — ABNORMAL LOW (ref 150–450)
RBC: 4.33 x10E6/uL (ref 4.14–5.80)
RDW: 13.1 % (ref 11.6–15.4)
WBC: 4.9 10*3/uL (ref 3.4–10.8)

## 2022-12-22 LAB — ALT: ALT: 14 IU/L (ref 0–44)

## 2022-12-22 LAB — BASIC METABOLIC PANEL
BUN/Creatinine Ratio: 20 (ref 10–24)
BUN: 26 mg/dL (ref 8–27)
CO2: 24 mmol/L (ref 20–29)
Calcium: 9.5 mg/dL (ref 8.6–10.2)
Chloride: 102 mmol/L (ref 96–106)
Creatinine, Ser: 1.29 mg/dL — ABNORMAL HIGH (ref 0.76–1.27)
Glucose: 99 mg/dL (ref 70–99)
Potassium: 5.1 mmol/L (ref 3.5–5.2)
Sodium: 138 mmol/L (ref 134–144)
eGFR: 58 mL/min/{1.73_m2} — ABNORMAL LOW (ref >59)

## 2022-12-22 LAB — LIPID PANEL
Chol/HDL Ratio: 3 ratio (ref 0.0–5.0)
Cholesterol, Total: 149 mg/dL (ref 100–199)
HDL: 49 mg/dL (ref >39)
LDL Chol Calc (NIH): 79 mg/dL (ref 0–99)
Triglycerides: 115 mg/dL (ref 0–149)
VLDL Cholesterol Cal: 21 mg/dL (ref 5–40)

## 2022-12-22 LAB — URIC ACID: Uric Acid: 5 mg/dL (ref 3.8–8.4)

## 2022-12-22 LAB — MAGNESIUM: Magnesium: 2 mg/dL (ref 1.6–2.3)

## 2022-12-22 LAB — LIPOPROTEIN A (LPA): Lipoprotein (a): 18.2 nmol/L (ref <75.0)

## 2022-12-28 ENCOUNTER — Telehealth: Payer: Self-pay | Admitting: Cardiovascular Disease

## 2022-12-28 DIAGNOSIS — E782 Mixed hyperlipidemia: Secondary | ICD-10-CM

## 2022-12-28 NOTE — Telephone Encounter (Signed)
Pt has viewed results and MD interpretation and recommendation via [place]. Referral placed at this time for lipid clinic.

## 2022-12-28 NOTE — Telephone Encounter (Signed)
-----  Message from Thayer Headings, MD sent at 12/23/2022  5:51 PM EST ----- LP(a) is normal at 18.2 .   This is excellent. His LDL is 79.  I would like for his LDL to be around 50.  He is on atorvastatin and Zetia.  Will refer to the lipid clinic for consideration of a PCSK9 inhibitor , Inclisiran or Bempadoic acid

## 2022-12-29 NOTE — Progress Notes (Signed)
Cardiology Office Note   Date:  12/30/2022   ID:  Dean [name], DOB 07/29/1946, MRN [id number]  PCP:  Esperanza Richters, PA-C  Cardiologist:   Kristeen Miss, MD   Chief Complaint  Patient presents with   Coronary Artery Disease        Atrial Fibrillation   Problem list 1. CAD  - CABG Nov. 2018 2.   Post of atrial fib  3   Hyperlipidemia 4. Gout 5. GERD  6.  CVA - prior to diagnosis of atrial fib      Dean [name] is a 77 y.o. male who presents for further evaluation of some chest pain and shortness of breath. I have reviewed records from [facility] center.  Has intermittant symptoms of DOE and CP Has occasional palpitations  Is hoarse at times  Has had a stress test - normal  Echo was normal .   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   Oct. 9, 2017:  Has started exercising some.   Is having gout issues which limits his exercise at time  Able to walk around without any significant CP or dyspnea.    Aug. 20, 2018  Dean [name] is seen back for follow-up visit for his premature ventricular contractions. Is taking Uloric for gouty tophi, Makes him nauseated, fatigued.   Has not had any exertional CP recently  Is on Indocin Saw his GP , saw something odd on ECG and he was sent here.  ECG here shows NSR , NS ST abn.  PVCs  Does not exercise,   No hx of CP or DOE Has rare episodes of CP when he is lying down .   March 08, 2018: Doing well from a cardiac standpont  Has had CABG , post op AF .   Nicely from a cardiac standpoint but then developed diverticulitis requiring surgery in mid March.  He was told by somebody during that hospitalization that he may have had a brief episode of atrial fibrillation.  There are no recorded episodes of atrial for ablation on the  telemetry strips and no EKGs suggesting atrial fibrillation.   He related a couple other complaints.  He has a bad taste in his mouth and has no appetite.  He also has had profound itching.  He thinks that it might be due to the warfarin therapy.  June 07, 2018:  Dean [name] is seen today for follow-up of his coronary artery disease and postoperative atrial fibrillation. Feeling great,   Walks 6 days a week at a rapid pace. No CP or dyspnea   Several concerns  - has some tenderness along his SVG harvest site on his left lower leg. Has some sternal tenderness also .   Jan. 10, 2020  Dean [name] is seen today for follow-up of his coronary artery disease and coronary artery bypass grafting.  He also has a history of hyperlipidemia and gout. No recent issues with gout.   Controlled with Uloric  Walks 1 hr a day ( walk / run)  No DOE on hills , no angina  Still has some chest wall tenderness. Has SVG harvest site numbness  Having back issues.    Wonders if its due to Atorvastatin .   March 21, 2020:  Dean [name] is seen today for follow-up of his coronary artery disease, coronary artery bypass grafting, atrial fibrillation, hyperlipidemia and gout.    He has a history of chronic systolic congestive heart failure following his myocardial infarction in 2018. He has a history of postoperative atrial fibrillation and and is back in atrial fibrillation today.  He is back in atrial fibrillation today. He cannot tell that he is   We will be restarting Eliquis 5 mg twice a day Discontinue aspirin Repeat echocardiogram for further assessment of his CHF.  Consider adding low-dose ARB if his LV functions is still depressed. We will have him see an APP in 1 month to determine whether or not he needs cardioversion and I will see him again in 6 months.  Nov. 16, 2021: Dean [name] is seen today for follow up of his CAD, CHF, PAF  His original EF was 35%. Follow up echo in April, 2021 showed an EF of 55-60%. Moderate  MR  On Eliquis  He has occasional episodes of tachycardia. Fit bit shows HR of 160 We discussed Kardia monitor Propranolol 10 mg tabs QID PRN   May 08, 2021: Dean [name] is seen today for follow up visit  Has had his first shingles shot Had a bad reaction to the shingles vaccine   Dec. 5, 2022 Dean [name] is seen today for follow up of his CAD , PAF , CHF BP is elevated at time  Still eating some salty foods.  His diet has improved.      Is walking 4 miles most days a week .  No CP , no dyspnea No recentl atrial fib , tikosyn is keeping him well controlled.   December 30, 2022: Dean [name] is seen today for follow-up of his coronary artery disease, paroxysmal atrial fibrillation, congestive heart failure.  He is on Tikosyn.  Will draw magnesium and BMP today.  Was having lightheadedness  Dr Elberta Fortis recently lowered his Losartan.   Has had 4 covid shots Has never had COVID . I did not recommend this booster   Labs from last week  Chol = 149 HDL = 49 LDL = 79 Trigs 115  LP(a) on December 21, 2022 is 18.2.  On atorvastatin 10 mg a day , zetia 10 mg a day  Will increase atorva to 80 Check lipids  in 3 months       Past Medical History:  Diagnosis Date   Acid reflux    history of   Anemia    Atrial fibrillation (HCC) 10/2017   RVR   Coronary artery disease    Diverticulitis 11/26/2017   Gout    Hepatitis A age 10   History of kidney stones    Hyperlipemia    Hypogonadism male    Irregular heart beat    Ischemic cardiomyopathy    Non compliance with medical treatment    Non-STEMI (non-ST elevated myocardial infarction) (HCC) 10/2017   Pelvic abscess in male [facility])    percutaneous drain placement    Past Surgical History:  Procedure Laterality Date   BLADDER REPAIR  02/17/2018   Procedure: BLADDER REPAIR;  Surgeon: Andria Meuse, MD;  Location: [facility] ORS;  Service: General;;   BLADDER REPAIR  02/17/2018   Procedure: pelvic exploration, assist with bladder repair ;   Surgeon: Heloise Purpura, MD;  Location: [facility];  Service: Urology;;   COLONOSCOPY  ~ 2014   Dr Noe Gens in [facility].  Diverticulosis.  pt denies colon polyps.     CORONARY ARTERY  BYPASS GRAFT N/A 10/26/2017   Procedure: CORONARY ARTERY BYPASS GRAFTING (CABG) x four , using left internal mammary artery and bilateral legs greater saphenous vein harvested endoscopically;  Surgeon: Alleen Borne, MD;  Location: [facility] OR;  Service: Open Heart Surgery;  Laterality: N/A;   CYSTOSCOPY W/ URETERAL STENT PLACEMENT Bilateral 02/17/2018   Procedure: CYSTOSCOPY WITH RETROGRADE PYELOGRAM/URETERAL STENT PLACEMENT;  Surgeon: Crist Fat, MD;  Location: [facility];  Service: Urology;  Laterality: Bilateral;   IR CATHETER TUBE CHANGE  01/27/2018   IR RADIOLOGIST EVAL & MGMT  12/21/2017   LAPAROSCOPIC SIGMOID COLECTOMY N/A 02/17/2018   Procedure: LAPAROSCOPIC SIGMOID COLECTOMY  ERAS PATHWAY TAKEDOWN OF COLOVESSICAL COLOCUTANEOUS FISTULA;  Surgeon: Andria Meuse, MD;  Location: [facility];  Service: General;  Laterality: N/A;   LEFT HEART CATH AND CORONARY ANGIOGRAPHY N/A 10/25/2017   Procedure: LEFT HEART CATH AND CORONARY ANGIOGRAPHY;  Surgeon: Marykay Lex, MD;  Location: [facility] INVASIVE CV LAB;  Service: Cardiovascular;  Laterality: N/A;   SIGMOIDOSCOPY N/A 02/17/2018   Procedure: FLEX SIGMOIDOSCOPY;  Surgeon: Andria Meuse, MD;  Location: [facility];  Service: General;  Laterality: N/A;   TEE WITHOUT CARDIOVERSION N/A 10/26/2017   Procedure: TRANSESOPHAGEAL ECHOCARDIOGRAM (TEE);  Surgeon: Alleen Borne, MD;  Location: [facility] OR;  Service: Open Heart Surgery;  Laterality: N/A;     Current Outpatient Medications  Medication Sig Dispense Refill   allopurinol (ZYLOPRIM) 100 MG tablet Take 100 mg by mouth daily.      atorvastatin (LIPITOR) 80 MG tablet Take 1 tablet (80 mg total) by mouth daily. 90 tablet 3   dofetilide (TIKOSYN) 500 MCG capsule Take 1 capsule (500 mcg total) by mouth 2 (two) times daily. 180  capsule 3   ELIQUIS 5 MG TABS tablet TAKE 1 TABLET BY MOUTH TWICE  DAILY 200 tablet 2   ezetimibe (ZETIA) 10 MG tablet TAKE ONE-HALF TABLET BY MOUTH  DAILY 45 tablet 0   losartan (COZAAR) 50 MG tablet Take 50 mg by mouth daily.     MAGnesium-Oxide 400 (240 Mg) MG tablet TAKE 1 TABLET(400 MG) BY MOUTH DAILY 90 tablet 2   No current facility-administered medications for this visit.    Allergies:   Crestor [rosuvastatin calcium]    Social History:  The patient  reports that he has quit smoking. He has never used smokeless tobacco. He reports that he does not currently use alcohol. He reports that he does not use drugs.   Family History:  The patient's family history includes Alzheimer's disease in his maternal grandmother and mother; Hyperlipidemia in his brother and mother.    ROS:     Physical Exam: Blood pressure 118/74, pulse (!) 54, height 5' 9.5" (1.765 m), weight 179 lb 12.8 oz (81.6 kg), SpO2 99 %.       GEN:  Well nourished, well developed in no acute distress HEENT: Normal NECK: No JVD; No carotid bruits LYMPHATICS: No lymphadenopathy CARDIAC: RRR , no murmurs, rubs, gallops RESPIRATORY:  Clear to auscultation without rales, wheezing or rhonchi  ABDOMEN: Soft, non-tender, non-distended MUSCULOSKELETAL:  No edema; No deformity  SKIN: Warm and dry NEUROLOGIC:  Alert and oriented x 3    EKG:      Recent Labs: 12/21/2022: ALT 14; BUN 26; Creatinine, Ser 1.29; Hemoglobin 14.0; Magnesium 2.0; Platelets 136; Potassium 5.1; Sodium 138    Lipid Panel    Component Value Date/Time   CHOL 149 12/21/2022 1029   TRIG 115 12/21/2022 1029   HDL 49  12/21/2022 1029   CHOLHDL 3.0 12/21/2022 1029   CHOLHDL 3.3 10/25/2017 0341   VLDL 21 10/25/2017 0341   LDLCALC 79 12/21/2022 1029      Wt Readings from Last 3 Encounters:  12/30/22 179 lb 12.8 oz (81.6 kg)  12/21/22 179 lb (81.2 kg)  10/21/22 172 lb (78 kg)      Other studies Reviewed: Additional studies/ records  that were reviewed today include: . Review of the above records demonstrates:   ASSESSMENT AND PLAN:  1.  CAD - s/p CABG  -   no angina   2. Atrial fib;     Tikosyn and metoprolol    3.  Chronic systolic congestive heart failure: stable   3.  Hyperlipidemia -    his LDL is 79 His goal is 55. Will increase atorvastatin to 80 mg ( he is intolerant to rosuvastatin ) Check labs in 3 months  Will refer to lipid clinic if he cannt reach his goal   4.  Hypertension:     Current medicines are reviewed at length with the patient today.  The patient does not have concerns regarding medicines.  The following changes have been made:  no change  Labs/ tests ordered today include:   Orders Placed This Encounter  Procedures   ALT   Lipid panel      Disposition:       Kristeen Miss, MD  12/30/2022 5:55 PM    [facility] [address] [place], [place], [place]  [address] Phone: [phone]; Fax: [phone]

## 2022-12-30 ENCOUNTER — Encounter: Payer: Self-pay | Admitting: Cardiovascular Disease

## 2022-12-30 ENCOUNTER — Ambulatory Visit: Payer: Medicare Other | Attending: Cardiovascular Disease | Admitting: Cardiovascular Disease

## 2022-12-30 VITALS — BP 118/74 | HR 54 | Ht 69.5 in | Wt 179.8 lb

## 2022-12-30 DIAGNOSIS — E782 Mixed hyperlipidemia: Secondary | ICD-10-CM

## 2022-12-30 DIAGNOSIS — Z79899 Other long term (current) drug therapy: Secondary | ICD-10-CM | POA: Diagnosis not present

## 2022-12-30 MED ORDER — ATORVASTATIN CALCIUM 80 MG PO TABS: 80.0000 mg | ORAL_TABLET | Freq: Every day | ORAL | 3 refills | Status: DC

## 2022-12-30 NOTE — Patient Instructions (Signed)
Medication Instructions:  Your physician has recommended you make the following change in your medication:   1) INCREASE atorvastatin (Lipitor) to 80 mg daily  *If you need a refill on your cardiac medications before your next appointment, please call your pharmacy*  Lab Work: In 3 months: fasting Lipid panel, ALT If you have labs (blood work) drawn today and your tests are completely normal, you will receive your results only by: [place] (if you have MyChart) OR A paper copy in the mail If you have any lab test that is abnormal or we need to change your treatment, we will call you to review the results.  Testing/Procedures: None  Follow-Up: At [facility], you and your health needs are our priority.  As part of our continuing mission to provide you with exceptional heart care, we have created designated Provider Care Teams.  These Care Teams include your primary Cardiologist (physician) and Advanced Practice Providers (APPs -  Physician Assistants and Nurse Practitioners) who all work together to provide you with the care you need, when you need it.  Your next appointment:   6 month(s)  Provider:   Christen Bame, NP

## 2023-01-05 ENCOUNTER — Other Ambulatory Visit: Payer: Self-pay | Admitting: Cardiovascular Disease

## 2023-01-11 ENCOUNTER — Telehealth: Payer: Self-pay | Admitting: Cardiovascular Disease

## 2023-01-11 MED ORDER — LOSARTAN POTASSIUM 50 MG PO TABS: 50.0000 mg | ORAL_TABLET | Freq: Every day | ORAL | 3 refills | Status: DC

## 2023-01-11 NOTE — Telephone Encounter (Signed)
Received refill request from pharmacy for Losartan '50mg'$  QD. Chart shows that at 12/21/22 OV w/Camnitz, medication was changed to '25mg'$  daily.   5. Hypertension: Blood pressure is low today. Will cut losartan in half. He has follow-up with his primary cardiologist next week for further evaluation.   At follow-up visit 12/30/22 w/Nahser, no medication changes were made to medication regimen and pt reported he was taking '50mg'$  daily. He was advised to take half his dose by [facility] and he was previously taking '100mg'$  daily. (See RX below)  Take 1 tablet (100 mg total) by mouth daily. Please keep upcoming appointment in January with Dr. Acie Fredrickson for future refills. Dispense: 30 tablet, Refills: 0 ordered   12/20/2022 12/21/2022 Dose change Nahser, Wonda Cheng, MD           Summary: Take 1 tablet (100 mg total) by mouth daily. Please keep upcoming appointment in January with Dr. Acie Fredrickson for future refills., Starting Mon 12/20/2022, Until Tue 12/21/2022, Normal Dose, Route, Frequency: 100 mg, Oral, DailyDx Associated: End Date Warning: Different Encounter: Start Date & Time: 01/15/2024Discontinued On: 01/16/2024End w/ Doses: 1/16/2024Ord/Sold: 12/20/2022 (O)Ordered On: 01/15/2024Pharmacy: [facility], [place] Instructions: Please keep upcoming appointment in January with Dr. Acie Fredrickson for future refills. Note to Pharmacy: Please send a replace/new response with 100-Day Supply if appropriate to maximize member benefit. Requesting 1 year supply. Dose History    Will send refill for '50mg'$  daily to pharmacy on file

## 2023-01-13 ENCOUNTER — Encounter (HOSPITAL_COMMUNITY): Payer: Self-pay | Admitting: *Deleted

## 2023-02-22 ENCOUNTER — Other Ambulatory Visit (HOSPITAL_COMMUNITY): Payer: Self-pay | Admitting: Physician Assistant

## 2023-03-02 DIAGNOSIS — Z129 Encounter for screening for malignant neoplasm, site unspecified: Secondary | ICD-10-CM | POA: Diagnosis not present

## 2023-03-02 DIAGNOSIS — L570 Actinic keratosis: Secondary | ICD-10-CM | POA: Diagnosis not present

## 2023-03-28 ENCOUNTER — Other Ambulatory Visit: Payer: Self-pay | Admitting: *Deleted

## 2023-03-28 MED ORDER — LOSARTAN POTASSIUM 50 MG PO TABS: 50.0000 mg | ORAL_TABLET | Freq: Every day | ORAL | 2 refills | Status: DC

## 2023-03-30 ENCOUNTER — Encounter: Payer: Self-pay | Admitting: Medical

## 2023-03-31 ENCOUNTER — Ambulatory Visit: Payer: Medicare Other | Attending: Cardiovascular Disease

## 2023-03-31 DIAGNOSIS — E782 Mixed hyperlipidemia: Secondary | ICD-10-CM | POA: Diagnosis not present

## 2023-03-31 DIAGNOSIS — Z79899 Other long term (current) drug therapy: Secondary | ICD-10-CM

## 2023-04-01 LAB — LIPID PANEL
Chol/HDL Ratio: 2.5 ratio (ref 0.0–5.0)
Cholesterol, Total: 124 mg/dL (ref 100–199)
HDL: 50 mg/dL (ref >39)
LDL Chol Calc (NIH): 56 mg/dL (ref 0–99)
Triglycerides: 93 mg/dL (ref 0–149)
VLDL Cholesterol Cal: 18 mg/dL (ref 5–40)

## 2023-04-01 LAB — ALT: ALT: 16 IU/L (ref 0–44)

## 2023-04-04 ENCOUNTER — Telehealth: Payer: Self-pay | Admitting: Cardiovascular Disease

## 2023-04-04 DIAGNOSIS — E782 Mixed hyperlipidemia: Secondary | ICD-10-CM

## 2023-04-04 NOTE — Telephone Encounter (Signed)
Spoke with patient and discussed lab results.  Per Dr. Elease Hashimoto: Lipids and ALT look good and are stable    Patient states he would like to make Dr. Elease Hashimoto aware that about a week after he increased atorvastatin from 10mg  to 80mg  he broke out in a rash. He continues to have intermittent itching that he treats with OTC creams to alleviate the itch. He also reports his nose runs "all the time" and he feels like he's coming down with a cold "all the time" since the dose increase.  He would like to know if it would be OK for him to decrease atorvastatin to 40mg  to see if this helps resolve the itching and possibly recheck Lipids/LFTs at his follow-up visit with Dr. Elease Hashimoto on 07/04/23.  He said if Dr. Elease Hashimoto didn't want to make this change now he could wait until appt, but will be having this discussion with him at that time.  Will forward to Dr. Elease Hashimoto to review and advise.

## 2023-04-05 ENCOUNTER — Ambulatory Visit (INDEPENDENT_AMBULATORY_CARE_PROVIDER_SITE_OTHER): Payer: Medicare Other | Admitting: Medical

## 2023-04-05 VITALS — BP 120/60 | HR 48 | Resp 18 | Ht 69.5 in | Wt 181.0 lb

## 2023-04-05 DIAGNOSIS — M1000 Idiopathic gout, unspecified site: Secondary | ICD-10-CM

## 2023-04-05 DIAGNOSIS — E782 Mixed hyperlipidemia: Secondary | ICD-10-CM

## 2023-04-05 DIAGNOSIS — D126 Benign neoplasm of colon, unspecified: Secondary | ICD-10-CM

## 2023-04-05 DIAGNOSIS — R5383 Other fatigue: Secondary | ICD-10-CM

## 2023-04-05 DIAGNOSIS — E559 Vitamin D deficiency, unspecified: Secondary | ICD-10-CM

## 2023-04-05 DIAGNOSIS — I10 Essential (primary) hypertension: Secondary | ICD-10-CM

## 2023-04-05 DIAGNOSIS — Z1211 Encounter for screening for malignant neoplasm of colon: Secondary | ICD-10-CM

## 2023-04-05 LAB — VITAMIN B12: Vitamin B-12: 295 pg/mL (ref 211–911)

## 2023-04-05 LAB — CBC WITH DIFFERENTIAL/PLATELET
Basophils Absolute: 0 10*3/uL (ref 0.0–0.1)
Basophils Relative: 0.6 % (ref 0.0–3.0)
Eosinophils Absolute: 0.2 10*3/uL (ref 0.0–0.7)
Eosinophils Relative: 3.8 % (ref 0.0–5.0)
HCT: 40.4 % (ref 39.0–52.0)
Hemoglobin: 13.5 g/dL (ref 13.0–17.0)
Lymphocytes Relative: 37.3 % (ref 12.0–46.0)
Lymphs Abs: 1.8 10*3/uL (ref 0.7–4.0)
MCHC: 33.5 g/dL (ref 30.0–36.0)
MCV: 96.5 fl (ref 78.0–100.0)
Monocytes Absolute: 0.4 10*3/uL (ref 0.1–1.0)
Monocytes Relative: 7.9 % (ref 3.0–12.0)
Neutro Abs: 2.4 10*3/uL (ref 1.4–7.7)
Neutrophils Relative %: 50.4 % (ref 43.0–77.0)
Platelets: 136 10*3/uL — ABNORMAL LOW (ref 150.0–400.0)
RBC: 4.19 Mil/uL — ABNORMAL LOW (ref 4.22–5.81)
RDW: 13.4 % (ref 11.5–15.5)
WBC: 4.7 10*3/uL (ref 4.0–10.5)

## 2023-04-05 LAB — COMPREHENSIVE METABOLIC PANEL
ALT: 13 U/L (ref 0–53)
AST: 20 U/L (ref 0–37)
Albumin: 4.1 g/dL (ref 3.5–5.2)
Alkaline Phosphatase: 51 U/L (ref 39–117)
BUN: 31 mg/dL — ABNORMAL HIGH (ref 6–23)
CO2: 29 mEq/L (ref 19–32)
Calcium: 9.2 mg/dL (ref 8.4–10.5)
Chloride: 103 mEq/L (ref 96–112)
Creatinine, Ser: 1.25 mg/dL (ref 0.40–1.50)
GFR: 56.49 mL/min — ABNORMAL LOW (ref >60.00)
Glucose, Bld: 95 mg/dL (ref 70–99)
Potassium: 4.7 mEq/L (ref 3.5–5.1)
Sodium: 139 mEq/L (ref 135–145)
Total Bilirubin: 0.8 mg/dL (ref 0.2–1.2)
Total Protein: 6.6 g/dL (ref 6.0–8.3)

## 2023-04-05 LAB — VITAMIN D 25 HYDROXY (VIT D DEFICIENCY, FRACTURES): VITD: 57.59 ng/mL (ref 30.00–100.00)

## 2023-04-05 LAB — TSH: TSH: 2.69 u[IU]/mL (ref 0.35–5.50)

## 2023-04-05 NOTE — Patient Instructions (Addendum)
1. Screening for colon cancer Place referral. Asked to get you scheduled for the fall per your request. [phone].  - Ambulatory referral to Gastroenterology  2. Adenomatous polyp of colon, unspecified part of colon Refer to gi above.  3. Hypertension, unspecified type Bp well controlled. Continue losartan 50 mg daily.  4. Mixed hyperlipidemia On zetia and atorvastatin. Defer changes to cardiologist. You note perceived side effect discuss with cardiologist.  5. Fatigue, unspecified type - B12 - TSH - CBC w/Diff - Comp Met (CMET)  Low vit d hx. Vit D level today.  6. Gout-do recommend that you keep rheumatologist based on complicated gout history.  Follow up date to be determined after lab review. Do ask you schedule appointment at least every 6 months based on medical history and age.

## 2023-04-05 NOTE — Progress Notes (Signed)
Subjective:    Patient ID: Dean [name], male    DOB: 02/01/1946, 77 y.o.   MRN: [id number]  HPI  Pt is walking one hour 6 days a week. Exercising.  Pt in for follow up he had wanted to be seen locally for gout as he was traveling to [facility] but indicated in my chart message it was becoming too far to to travel. After review of epic and last rheumatologist not sent back pt message.  Epic care where rheumatologist last note.  # Gout Doing exceptionally well with no flares. Risk/benefit is in favor of continuing allopurinol at this dose indefinitely, so long as no side effects or major changes in kidney function - continue allopurinol 100mg  daily - uric acid ordered for next week to be done locally  # CKD - asked patient to contact me if any worsening of kidneys, so we can adjust dosing accordingly   Follow-up: Return in about 1 year (around 06/10/2022) for [facility].   This video encounter was conducted with the patient's (or proxy's) verbal consent via secure, interactive audio and video telecommunications while in clinic/office/hospital.    Pt states that he has no gout flares for more than 6 years. Pt has seen 4 rheumaologist before. He was not happy with treatment 3 of other rheumatologist though his case sounds to have been very challenging. Side effect of uloric and pt not fan of colchicine.   "I think I have not seen you in more than a year. Did review Rheumatologist last note and looks like they were monitoring your kidney function since you are onallopurinol. I would ask you schedule in person visit with me to discuss your gout and will get metabolic panel. Also will consider referral to rheumatologist locally as you are with one currently. "   Htn and high cholesero- pt is on zetia 10 mg daily, losartan 50 mg daily.and atorvastatin 80 mg daily. Pt associated some perceived side effects that he states came on when he increased the dose of atorvastatin. He states one week after  got a rash on chest with itching. Rash lasted for about one week but skin itching persist daily. Pt is convinced this was caused by atorvastatin. He wants to take lower dose 40 mg daily. Upcoming appt with cardiologist.  Pt has seen dermatologist. Seen twice already. Most recently in March.   On review pt had shingrix vaccine at [employer]. He states had both vaccines.  Pt has not had pneumonia vaccine. He feels not necessary. He states rarely get sick.   He states will get tetanus later.  Pt had colonosocpy in 2014. On review of that report showed polyp. Recommendation was to repeat in 5 years if hyperplastic. Pt did not return. I don't see pathology report. No abdomen pain. No black or bloody stools.   Review of Systems  Constitutional:  Positive for fatigue. Negative for chills and fever.       Gradual mild fatigue over past 2 years. But able to walk daily.  Respiratory:  Negative for chest tightness, shortness of breath and wheezing.   Cardiovascular:  Negative for chest pain and palpitations.  Gastrointestinal:  Negative for abdominal pain, anal bleeding and blood in stool.  Genitourinary:  Negative for dysuria.  Musculoskeletal:  Negative for arthralgias.       No acute flares of gout.  Neurological:  Negative for dizziness, tremors, speech difficulty, light-headedness and numbness.  Hematological:  Negative for adenopathy. Does not bruise/bleed easily.  Psychiatric/Behavioral:  Negative for behavioral problems and confusion.     Past Medical History:  Diagnosis Date   Acid reflux    history of   Anemia    Atrial fibrillation (HCC) 10/2017   RVR   Coronary artery disease    Diverticulitis 11/26/2017   Gout    Hepatitis A age 28   History of kidney stones    Hyperlipemia    Hypogonadism male    Irregular heart beat    Ischemic cardiomyopathy    Non compliance with medical treatment    Non-STEMI (non-ST elevated myocardial infarction) (HCC) 10/2017   Pelvic abscess  in male [facility])    percutaneous drain placement     Social History   Socioeconomic History   Marital status: Single    Spouse name: Not on file   Number of children: Not on file   Years of education: Not on file   Highest education level: Some college, no degree  Occupational History   Occupation: retired  Tobacco Use   Smoking status: Former   Smokeless tobacco: Never  Building services engineer Use: Never used  Substance and Sexual Activity   Alcohol use: Not Currently   Drug use: No   Sexual activity: Not on file  Other Topics Concern   Not on file  Social History Narrative   Not on file   Social Determinants of Health   Financial Resource Strain: Low Risk  (03/31/2023)   Overall Financial Resource Strain (CARDIA)    Difficulty of Paying Living Expenses: Not hard at all  Food Insecurity: No Food Insecurity (03/31/2023)   Hunger Vital Sign    Worried About Running Out of Food in the Last Year: Never true    Ran Out of Food in the Last Year: Never true  Transportation Needs: No Transportation Needs (03/31/2023)   PRAPARE - Administrator, Civil Service (Medical): No    Lack of Transportation (Non-Medical): No  Physical Activity: Sufficiently Active (03/31/2023)   Exercise Vital Sign    Days of Exercise per Week: 6 days    Minutes of Exercise per Session: 60 min  Stress: No Stress Concern Present (03/31/2023)   [employer] of Occupational Health - Occupational Stress Questionnaire    Feeling of Stress : Not at all  Social Connections: Socially Isolated (03/31/2023)   Social Connection and Isolation Panel [NHANES]    Frequency of Communication with Friends and Family: Once a week    Frequency of Social Gatherings with Friends and Family: Once a week    Attends Religious Services: Never    Database administrator or Organizations: No    Attends Engineer, structural: Not on file    Marital Status: Never married  Intimate Partner Violence: Not At Risk  (10/19/2021)   Humiliation, Afraid, Rape, and Kick questionnaire    Fear of Current or Ex-Partner: No    Emotionally Abused: No    Physically Abused: No    Sexually Abused: No    Past Surgical History:  Procedure Laterality Date   BLADDER REPAIR  02/17/2018   Procedure: BLADDER REPAIR;  Surgeon: Andria Meuse, MD;  Location: [facility] ORS;  Service: General;;   BLADDER REPAIR  02/17/2018   Procedure: pelvic exploration, assist with bladder repair ;  Surgeon: Heloise Purpura, MD;  Location: [facility];  Service: Urology;;   COLONOSCOPY  ~ 2014   Dr Noe Gens in [facility].  Diverticulosis.  pt denies colon polyps.  CORONARY ARTERY BYPASS GRAFT N/A 10/26/2017   Procedure: CORONARY ARTERY BYPASS GRAFTING (CABG) x four , using left internal mammary artery and bilateral legs greater saphenous vein harvested endoscopically;  Surgeon: Alleen Borne, MD;  Location: [facility] OR;  Service: Open Heart Surgery;  Laterality: N/A;   CYSTOSCOPY W/ URETERAL STENT PLACEMENT Bilateral 02/17/2018   Procedure: CYSTOSCOPY WITH RETROGRADE PYELOGRAM/URETERAL STENT PLACEMENT;  Surgeon: Crist Fat, MD;  Location: [facility];  Service: Urology;  Laterality: Bilateral;   IR CATHETER TUBE CHANGE  01/27/2018   IR RADIOLOGIST EVAL & MGMT  12/21/2017   LAPAROSCOPIC SIGMOID COLECTOMY N/A 02/17/2018   Procedure: LAPAROSCOPIC SIGMOID COLECTOMY  ERAS PATHWAY TAKEDOWN OF COLOVESSICAL COLOCUTANEOUS FISTULA;  Surgeon: Andria Meuse, MD;  Location: [facility];  Service: General;  Laterality: N/A;   LEFT HEART CATH AND CORONARY ANGIOGRAPHY N/A 10/25/2017   Procedure: LEFT HEART CATH AND CORONARY ANGIOGRAPHY;  Surgeon: Marykay Lex, MD;  Location: [facility] INVASIVE CV LAB;  Service: Cardiovascular;  Laterality: N/A;   SIGMOIDOSCOPY N/A 02/17/2018   Procedure: FLEX SIGMOIDOSCOPY;  Surgeon: Andria Meuse, MD;  Location: [facility];  Service: General;  Laterality: N/A;   TEE WITHOUT CARDIOVERSION N/A 10/26/2017   Procedure:  TRANSESOPHAGEAL ECHOCARDIOGRAM (TEE);  Surgeon: Alleen Borne, MD;  Location: [facility];  Service: Open Heart Surgery;  Laterality: N/A;    Family History  Problem Relation Age of Onset   Hyperlipidemia Mother    Alzheimer's disease Mother    Hyperlipidemia Brother    Alzheimer's disease Maternal Grandmother    Colon cancer Neg Hx    Esophageal cancer Neg Hx    Stomach cancer Neg Hx     Allergies  Allergen Reactions   Crestor [Rosuvastatin Calcium]     Reported intolerance to Lipitor in the past Crestor 40mg  patient reported muscle aches, dose reduced to rechallenge    Current Outpatient Medications on File Prior to Visit  Medication Sig Dispense Refill   allopurinol (ZYLOPRIM) 100 MG tablet Take 100 mg by mouth daily.      atorvastatin (LIPITOR) 80 MG tablet Take 1 tablet (80 mg total) by mouth daily. 90 tablet 3   dofetilide (TIKOSYN) 500 MCG capsule TAKE 1 CAPSULE BY MOUTH TWICE  DAILY 180 capsule 3   ELIQUIS 5 MG TABS tablet TAKE 1 TABLET BY MOUTH TWICE  DAILY 200 tablet 2   ezetimibe (ZETIA) 10 MG tablet TAKE ONE-HALF TABLET BY MOUTH  DAILY 45 tablet 1   losartan (COZAAR) 50 MG tablet Take 1 tablet (50 mg total) by mouth daily. 90 tablet 2   MAGnesium-Oxide 400 (240 Mg) MG tablet TAKE 1 TABLET(400 MG) BY MOUTH DAILY 90 tablet 2   No current facility-administered medications on file prior to visit.    BP 120/60   Pulse (!) 48   Resp 18   Ht 5' 9.5" (1.765 m)   Wt 181 lb (82.1 kg)   SpO2 100%   BMI 26.35 kg/m        Objective:   Physical Exam  General Mental Status- Alert. General Appearance- Not in acute distress.   Skin General: Color- Normal Color. Moisture- Normal Moisture.  Neck Carotid Arteries- Normal color. Moisture- Normal Moisture. No carotid bruits. No JVD.  Chest and Lung Exam Auscultation: Breath Sounds:-Normal.  Cardiovascular Auscultation:Rythm- Regular. Murmurs & Other Heart Sounds:Auscultation of the heart reveals- No  Murmurs.  Abdomen Inspection:-Inspeection Normal. Palpation/Percussion:Note:No mass. Palpation and Percussion of the abdomen reveal- Non Tender, Non Distended + BS, no rebound  or guarding.  Neurologic Cranial Nerve exam:- CN III-XII intact(No nystagmus), symmetric smile. Strength:- 5/5 equal and symmetric strength both upper and lower extremities.       Assessment & Plan:   Patient Instructions  1. Screening for colon cancer Place referral. Asked to get you scheduled for the fall per your request. [phone].  - Ambulatory referral to Gastroenterology  2. Adenomatous polyp of colon, unspecified part of colon Refer to gi above.  3. Hypertension, unspecified type Bp well controlled. Continue losartan 50 mg daily.  4. Mixed hyperlipidemia On zetia and atorvastatin. Defer changes to cardiologist. You note perceived side effect discuss with cardiologist.  5. Fatigue, unspecified type - B12 - TSH - CBC w/Diff - Comp Met (CMET)  Low vit d hx. Vit D level today.  Follow up date to be determined after lab review.     6. Gout-do recommend that you keep rheumatologist based on complicated gout history.    Esperanza Richters, PA-C   Time spent with patient today was 50 minutes which consisted of chart revdiew, discussing diagnoses, treatments., work up treatment and documentation.

## 2023-04-06 NOTE — Telephone Encounter (Signed)
Nahser, Deloris Ping, MD  Eugene Gavia K Caller: Unspecified (2 days ago,  1:58 PM) I do not think his rash and runny nose are due to the rosuvastatin but he may reduce the dose as he requested and see if it makes any difference  PN   Called and spoke with patient about MD's comments. Reiterated that likely not the statin medication causing his itching and runny nose, but he intends to cut the tablet in half and proceed with taking 40mg  daily. Labs to be re-checked at his next appt on 07/04/23, orders placed at this time.

## 2023-04-26 ENCOUNTER — Encounter: Payer: Self-pay | Admitting: Medical

## 2023-04-26 MED ORDER — ALLOPURINOL 100 MG PO TABS: 100.0000 mg | ORAL_TABLET | Freq: Every day | ORAL | 3 refills | Status: DC

## 2023-04-26 NOTE — Addendum Note (Signed)
Addended by: Gwenevere Abbot on: 04/26/2023 05:07 PM   Modules accepted: Orders

## 2023-05-27 ENCOUNTER — Other Ambulatory Visit: Payer: Self-pay | Admitting: Cardiovascular Disease

## 2023-06-15 ENCOUNTER — Ambulatory Visit (HOSPITAL_COMMUNITY)
Admission: RE | Admit: 2023-06-15 | Discharge: 2023-06-15 | Disposition: A | Discharge status: Home / Self Care | Payer: Medicare Other | Source: Ambulatory Visit | Attending: Physician Assistant | Admitting: Physician Assistant

## 2023-06-15 ENCOUNTER — Encounter (HOSPITAL_COMMUNITY): Payer: Self-pay | Admitting: Physician Assistant

## 2023-06-15 VITALS — BP 132/82 | HR 43 | Ht 69.5 in | Wt 176.0 lb

## 2023-06-15 DIAGNOSIS — D6869 Other thrombophilia: Secondary | ICD-10-CM | POA: Insufficient documentation

## 2023-06-15 DIAGNOSIS — Z79899 Other long term (current) drug therapy: Secondary | ICD-10-CM | POA: Insufficient documentation

## 2023-06-15 DIAGNOSIS — Z951 Presence of aortocoronary bypass graft: Secondary | ICD-10-CM | POA: Diagnosis not present

## 2023-06-15 DIAGNOSIS — I4819 Other persistent atrial fibrillation: Secondary | ICD-10-CM | POA: Insufficient documentation

## 2023-06-15 DIAGNOSIS — I10 Essential (primary) hypertension: Secondary | ICD-10-CM | POA: Insufficient documentation

## 2023-06-15 DIAGNOSIS — E785 Hyperlipidemia, unspecified: Secondary | ICD-10-CM | POA: Diagnosis not present

## 2023-06-15 DIAGNOSIS — Z5181 Encounter for therapeutic drug level monitoring: Secondary | ICD-10-CM | POA: Diagnosis not present

## 2023-06-15 DIAGNOSIS — Z7901 Long term (current) use of anticoagulants: Secondary | ICD-10-CM | POA: Diagnosis not present

## 2023-06-15 DIAGNOSIS — I2510 Atherosclerotic heart disease of native coronary artery without angina pectoris: Secondary | ICD-10-CM | POA: Insufficient documentation

## 2023-06-15 LAB — BASIC METABOLIC PANEL
Anion gap: 8 (ref 5–15)
BUN: 26 mg/dL — ABNORMAL HIGH (ref 8–23)
CO2: 24 mmol/L (ref 22–32)
Calcium: 8.9 mg/dL (ref 8.9–10.3)
Chloride: 106 mmol/L (ref 98–111)
Creatinine, Ser: 1.24 mg/dL (ref 0.61–1.24)
GFR, Estimated: >60 mL/min (ref >60)
Glucose, Bld: 103 mg/dL — ABNORMAL HIGH (ref 70–99)
Potassium: 4.2 mmol/L (ref 3.5–5.1)
Sodium: 138 mmol/L (ref 135–145)

## 2023-06-15 LAB — MAGNESIUM: Magnesium: 2 mg/dL (ref 1.7–2.4)

## 2023-06-15 MED ORDER — ATORVASTATIN CALCIUM 40 MG PO TABS: 40.0000 mg | ORAL_TABLET | Freq: Every day | ORAL | 3 refills | Status: DC

## 2023-06-15 MED ORDER — DOFETILIDE 500 MCG PO CAPS: 500.0000 ug | ORAL_CAPSULE | Freq: Two times a day (BID) | ORAL | 2 refills | Status: DC

## 2023-06-15 NOTE — Progress Notes (Signed)
Primary Care Physician: Esperanza Richters, PA-C Primary Cardiologist: Kristeen Miss, MD Electrophysiologist: Will Jorja Loa, MD  Referring Physician: Dr Elease Hashimoto   Dean [name] is a 77 y.o. male with a history of CAD s/p CABG 2018, HLD, HTN, atrial fibrillation who presents for follow up in the [facility].  Patient has been maintained on dofetilide. He is on Eliquis for a CHADS2VASC score of 4.  On follow up today, patient reports that he has done well from an afib standpoint. He has not had any interim episodes of afib. No bleeding issues on anticoagulation. He does have some dizziness with position change but this has been chronic for him.   Today, he denies symptoms of palpitations, chest pain, shortness of breath, orthopnea, PND, lower extremity edema, presyncope, syncope, snoring, daytime somnolence, bleeding, or neurologic sequela. The patient is tolerating medications without difficulties and is otherwise without complaint today.    Atrial Fibrillation Risk Factors:  he does not have symptoms or diagnosis of sleep apnea. he does not have a history of rheumatic fever.   Atrial Fibrillation Management history:  Previous antiarrhythmic drugs: dofetilide  Previous cardioversions: none Previous ablations: none Anticoagulation history: Eliquis  ROS- All systems are reviewed and negative except as per the HPI above.   Physical Exam: BP 132/82   Pulse (!) 43   Ht 5' 9.5" (1.765 m)   Wt 79.8 kg   BMI 25.62 kg/m   GEN: Well nourished, well developed in no acute distress NECK: No JVD; No carotid bruits CARDIAC: Regular rate and rhythm, no murmurs, rubs, gallops RESPIRATORY:  Clear to auscultation without rales, wheezing or rhonchi  ABDOMEN: Soft, non-tender, non-distended EXTREMITIES:  No edema; No deformity   Wt Readings from Last 3 Encounters:  06/15/23 79.8 kg  04/05/23 82.1 kg  12/30/22 81.6 kg     EKG today demonstrates  SB,  blocked PAC, IVCD Vent. rate 43 BPM PR interval 212 ms QRS duration 126 ms QT/QTcB 464/392 ms  Echo 03/24/20 demonstrated   1. Improvement of the LVEF noted as compared to echo from 2018. Segmental wall motion involving lateral and inferior wall documented on echo from 2018 not seen on this study. Left ventricular ejection fraction, by  estimation, is 55 to 60%. The left ventricle has normal function. The left ventricle has no regional wall motion abnormalities. There is mild left ventricular hypertrophy. Left ventricular diastolic parameters were normal.   2. Right ventricular systolic function is normal. The right ventricular  size is normal. There is normal pulmonary artery systolic pressure.   3. The mitral valve is normal in structure. Moderate mitral valve  regurgitation. No evidence of mitral stenosis.   4. The aortic valve is normal in structure. Aortic valve regurgitation is  trivial. No aortic stenosis is present.   5. The inferior vena cava is normal in size with greater than 50%  respiratory variability, suggesting right atrial pressure of 3 mmHg.    CHA2DS2-VASc Score = 4  The patient's score is based upon: CHF History: 0 HTN History: 1 Diabetes History: 0 Stroke History: 0 Vascular Disease History: 1 Age Score: 2 Gender Score: 0       ASSESSMENT AND PLAN: Persistent Atrial Fibrillation (ICD10:  I48.19) The patient's CHA2DS2-VASc score is 4, indicating a 4.8% annual risk of stroke.   S/p dofetilide loading 2022 Patient appears to be maintaining SR Continue dofetilide 500 mcg BID. QT stable Check bmet/mag today Continue Eliquis 5 mg BID  Secondary Hypercoagulable  State (ICD10:  U8381567) The patient is at significant risk for stroke/thromboembolism based upon his CHA2DS2-VASc Score of 4.  Continue Apixaban (Eliquis).   HTN Stable on current regimen  Valvular heart disease Moderate MR Followed by Dr Elease Hashimoto  CAD S/p CABG 2018 No anginal symptoms   Follow  up in the AF clinic in 6 months for dofetilide monitoring.    Jorja Loa PA-C Afib Clinic [facility] [address] [place], [place] [address] [phone]

## 2023-06-21 ENCOUNTER — Other Ambulatory Visit: Payer: Self-pay | Admitting: Cardiovascular Disease

## 2023-07-02 NOTE — Progress Notes (Unsigned)
Cardiology Office Note   Date:  07/04/2023   ID:  Dean [name], DOB Mar 30, 1946, MRN [id number]  PCP:  Dean Richters, PA-C  Cardiologist:   Dean Miss, MD   Chief Complaint  Patient presents with   Coronary Artery Disease        Atrial Fibrillation        Hyperlipidemia   Problem list 1. CAD  - CABG Nov. 2018 2.   Post of atrial fib  3   Hyperlipidemia 4. Gout 5. GERD  6.  CVA - prior to diagnosis of atrial fib      Dean [name] is a 77 y.o. male who presents for further evaluation of some chest pain and shortness of breath. I have reviewed records from [facility] center.  Has intermittant symptoms of DOE and CP Has occasional palpitations  Is hoarse at times  Has had a stress test - normal  Echo was normal .   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   Oct. 9, 2017:  Has started exercising some.   Is having gout issues which limits his exercise at time  Able to walk around without any significant CP or dyspnea.    Aug. 20, 2018  Dean [name] is seen back for follow-up visit for his premature ventricular contractions. Is taking Uloric for gouty tophi, Makes him nauseated, fatigued.   Has not had any exertional CP recently  Is on Indocin Saw his GP , saw something odd on ECG and he was sent here.  ECG here shows NSR , NS ST abn.  PVCs  Does not exercise,   No hx of CP or DOE Has rare episodes of CP when he is lying down .   March 08, 2018: Doing well from a cardiac standpont  Has had CABG , post op AF .   Nicely from a cardiac standpoint but then developed diverticulitis requiring surgery in mid March.  He was told by somebody during that hospitalization that he may have had a brief episode of atrial fibrillation.  There are no recorded episodes of atrial  for ablation on the telemetry strips and no EKGs suggesting atrial fibrillation.   He related a couple other complaints.  He has a bad taste in his mouth and has no appetite.  He also has had profound itching.  He thinks that it might be due to the warfarin therapy.  June 07, 2018:  Dean [name] is seen today for follow-up of his coronary artery disease and postoperative atrial fibrillation. Feeling great,   Walks 6 days a week at a rapid pace. No CP or dyspnea   Several concerns  - has some tenderness along his SVG harvest site on his left lower leg. Has some sternal tenderness also .   Jan. 10, 2020  Dean [name] is seen today for follow-up of his coronary artery disease and coronary artery bypass grafting.  He also has a history of hyperlipidemia and gout. No recent issues with gout.   Controlled with Uloric  Walks 1 hr a day ( walk / run)  No DOE on hills , no angina  Still has some chest wall tenderness. Has SVG harvest site numbness  Having back issues.    Wonders if its due  to Atorvastatin .   March 21, 2020:  Dean [name] is seen today for follow-up of his coronary artery disease, coronary artery bypass grafting, atrial fibrillation, hyperlipidemia and gout.    He has a history of chronic systolic congestive heart failure following his myocardial infarction in 2018. He has a history of postoperative atrial fibrillation and and is back in atrial fibrillation today.  He is back in atrial fibrillation today. He cannot tell that he is   We will be restarting Eliquis 5 mg twice a day Discontinue aspirin Repeat echocardiogram for further assessment of his CHF.  Consider adding low-dose ARB if his LV functions is still depressed. We will have him see an APP in 1 month to determine whether or not he needs cardioversion and I will see him again in 6 months.  Nov. 16, 2021: Dean [name] is seen today for follow up of his CAD, CHF, PAF  His original EF was 35%. Follow up echo in April, 2021 showed an EF of  55-60%. Moderate MR  On Eliquis  He has occasional episodes of tachycardia. Fit bit shows HR of 160 We discussed Kardia monitor Propranolol 10 mg tabs QID PRN   May 08, 2021: Dean [name] is seen today for follow up visit  Has had his first shingles shot Had a bad reaction to the shingles vaccine   Dec. 5, 2022 Dean [name] is seen today for follow up of his CAD , PAF , CHF BP is elevated at time  Still eating some salty foods.  His diet has improved.      Is walking 4 miles most days a week .  No CP , no dyspnea No recentl atrial fib , tikosyn is keeping him well controlled.   December 30, 2022: Dean [name] is seen today for follow-up of his coronary artery disease, paroxysmal atrial fibrillation, congestive heart failure.  He is on Tikosyn.  Will draw magnesium and BMP today.  Was having lightheadedness  Dr Elberta Fortis recently lowered his Losartan.   Has had 4 covid shots Has never had COVID . I did not recommend this booster   Labs from last week  Chol = 149 HDL = 49 LDL = 79 Trigs 115  LP(a) on December 21, 2022 is 18.2.  On atorvastatin 10 mg a day , zetia 10 mg a day  Will increase atorva to 80 Check lipids  in 3 months    July 04, 2023  Dean [name] is seen for follow up of his CAD, atrial fib  Hyperlipidemia  Recently saw Jorja Loa , PA in the Afib clinic Potassium and mag levels were normal   Has developed orthostatic hypotension . He thinks its related to increasing his atorvastatin . Drinks water, Advised adding electrolytes    Developed hives when we increased his Atorvastatin from 10 mg to 80 mg  We reduced his dose to Atorva 40 mg   He finds PAF occasionally with his [employer] with EP He is already on max dose Tikosyn.  I have asked him to check in with EP if he has further concerns.  He may be a candidate for A-fib ablation. He says the PAF is not bad enough to be a problem   Has developed frequent diarrhea  Has gout.  Wants to have a uric acid checked    Wants to have an occational ETOH.     Past Medical History:  Diagnosis Date   Acid reflux    history of   Anemia  Atrial fibrillation (HCC) 10/2017   RVR   Coronary artery disease    Diverticulitis 11/26/2017   Gout    Hepatitis A age 33   History of kidney stones    Hyperlipemia    Hypogonadism male    Irregular heart beat    Ischemic cardiomyopathy    Non compliance with medical treatment    Non-STEMI (non-ST elevated myocardial infarction) (HCC) 10/2017   Pelvic abscess in male [facility])    percutaneous drain placement    Past Surgical History:  Procedure Laterality Date   BLADDER REPAIR  02/17/2018   Procedure: BLADDER REPAIR;  Surgeon: Andria Meuse, MD;  Location: [facility] ORS;  Service: General;;   BLADDER REPAIR  02/17/2018   Procedure: pelvic exploration, assist with bladder repair ;  Surgeon: Heloise Purpura, MD;  Location: [facility];  Service: Urology;;   COLONOSCOPY  ~ 2014   Dr Noe Gens in [facility].  Diverticulosis.  pt denies colon polyps.     CORONARY ARTERY BYPASS GRAFT N/A 10/26/2017   Procedure: CORONARY ARTERY BYPASS GRAFTING (CABG) x four , using left internal mammary artery and bilateral legs greater saphenous vein harvested endoscopically;  Surgeon: Alleen Borne, MD;  Location: [facility] OR;  Service: Open Heart Surgery;  Laterality: N/A;   CYSTOSCOPY W/ URETERAL STENT PLACEMENT Bilateral 02/17/2018   Procedure: CYSTOSCOPY WITH RETROGRADE PYELOGRAM/URETERAL STENT PLACEMENT;  Surgeon: Crist Fat, MD;  Location: [facility];  Service: Urology;  Laterality: Bilateral;   IR CATHETER TUBE CHANGE  01/27/2018   IR RADIOLOGIST EVAL & MGMT  12/21/2017   LAPAROSCOPIC SIGMOID COLECTOMY N/A 02/17/2018   Procedure: LAPAROSCOPIC SIGMOID COLECTOMY  ERAS PATHWAY TAKEDOWN OF COLOVESSICAL COLOCUTANEOUS FISTULA;  Surgeon: Andria Meuse, MD;  Location: [facility];  Service: General;  Laterality: N/A;   LEFT HEART CATH AND CORONARY ANGIOGRAPHY N/A 10/25/2017   Procedure: LEFT  HEART CATH AND CORONARY ANGIOGRAPHY;  Surgeon: Marykay Lex, MD;  Location: [facility] INVASIVE CV LAB;  Service: Cardiovascular;  Laterality: N/A;   SIGMOIDOSCOPY N/A 02/17/2018   Procedure: FLEX SIGMOIDOSCOPY;  Surgeon: Andria Meuse, MD;  Location: [facility];  Service: General;  Laterality: N/A;   TEE WITHOUT CARDIOVERSION N/A 10/26/2017   Procedure: TRANSESOPHAGEAL ECHOCARDIOGRAM (TEE);  Surgeon: Alleen Borne, MD;  Location: [facility] OR;  Service: Open Heart Surgery;  Laterality: N/A;     Current Outpatient Medications  Medication Sig Dispense Refill   allopurinol (ZYLOPRIM) 100 MG tablet Take 1 tablet (100 mg total) by mouth daily. 90 tablet 3   atorvastatin (LIPITOR) 40 MG tablet Take 1 tablet (40 mg total) by mouth daily. 90 tablet 3   dofetilide (TIKOSYN) 500 MCG capsule Take 1 capsule (500 mcg total) by mouth 2 (two) times daily. 180 capsule 2   ELIQUIS 5 MG TABS tablet TAKE 1 TABLET BY MOUTH TWICE  DAILY 200 tablet 2   ezetimibe (ZETIA) 10 MG tablet TAKE ONE-HALF TABLET BY MOUTH  DAILY 100 tablet 2   losartan (COZAAR) 50 MG tablet Take 1 tablet (50 mg total) by mouth daily. 90 tablet 2   MAGnesium-Oxide 400 (240 Mg) MG tablet TAKE 1 TABLET(400 MG) BY MOUTH DAILY 90 tablet 2   psyllium (METAMUCIL) 58.6 % powder Take 1 packet by mouth daily.     vitamin B-12 (CYANOCOBALAMIN) 500 MCG tablet Take 500 mcg by mouth daily.     No current facility-administered medications for this visit.    Allergies:   Crestor [rosuvastatin calcium]    Social History:  The patient  reports that he has quit smoking. He has never used smokeless tobacco. He reports that he does not currently use alcohol. He reports that he does not use drugs.   Family History:  The patient's family history includes Alzheimer's disease in his maternal grandmother and mother; Hyperlipidemia in his brother and mother.    ROS:      Physical Exam: Blood pressure 124/76, pulse 82, height 5' 9.5" (1.765 m), weight 176 lb  (79.8 kg), SpO2 97%.       GEN:  Well nourished, well developed in no acute distress HEENT: Normal NECK: No JVD; No carotid bruits LYMPHATICS: No lymphadenopathy CARDIAC: RRR , sinus arrhythmia   RESPIRATORY:  Clear to auscultation without rales, wheezing or rhonchi  ABDOMEN: Soft, non-tender, non-distended MUSCULOSKELETAL:  No edema; No deformity  SKIN: Warm and dry NEUROLOGIC:  Alert and oriented x 3    EKG:      Recent Labs: 04/05/2023: ALT 13; Hemoglobin 13.5; Platelets 136.0; TSH 2.69 06/15/2023: BUN 26; Creatinine, Ser 1.24; Magnesium 2.0; Potassium 4.2; Sodium 138    Lipid Panel    Component Value Date/Time   CHOL 124 03/31/2023 0747   TRIG 93 03/31/2023 0747   HDL 50 03/31/2023 0747   CHOLHDL 2.5 03/31/2023 0747   CHOLHDL 3.3 10/25/2017 0341   VLDL 21 10/25/2017 0341   LDLCALC 56 03/31/2023 0747      Wt Readings from Last 3 Encounters:  07/04/23 176 lb (79.8 kg)  06/15/23 176 lb (79.8 kg)  04/05/23 181 lb (82.1 kg)      Other studies Reviewed: Additional studies/ records that were reviewed today include: . Review of the above records demonstrates:   ASSESSMENT AND PLAN:  1.  CAD - s/p CABG  -    denies having any episodes of angina.  2. Atrial fib;    has occasional episodes of paroxysmal atrial fibrillation.  He is on maximal dose Tikosyn. I explained that his neck step would be to ask electrophysiology if he is a candidate for A-fib ablation.    3.  Chronic systolic congestive heart failure: s he is stable and is not having any symptoms of CHF.  3.  Hyperlipidemia -    check labs today.  4.  Hypertension: He is having episodes of orthostatic hypotension.  Will reduce his dose of losartan to 25 mg a day.  5.  History of gout.  Will check uric acid today at his request.  Current medicines are reviewed at length with the patient today.  The patient does not have concerns regarding medicines.  The following changes have been made:  no  change  Labs/ tests ordered today include:   No orders of the defined types were placed in this encounter.     Disposition:       Dean Miss, MD  07/04/2023 9:48 AM    [facility] [address] [place], [place], [place]  [address] Phone: [phone]; Fax: [phone]

## 2023-07-04 ENCOUNTER — Ambulatory Visit: Payer: Medicare Other

## 2023-07-04 ENCOUNTER — Encounter: Payer: Self-pay | Admitting: Cardiovascular Disease

## 2023-07-04 ENCOUNTER — Ambulatory Visit: Payer: Medicare Other | Admitting: Cardiovascular Disease

## 2023-07-04 VITALS — BP 124/76 | HR 82 | Ht 69.5 in | Wt 176.0 lb

## 2023-07-04 DIAGNOSIS — Z79899 Other long term (current) drug therapy: Secondary | ICD-10-CM

## 2023-07-04 DIAGNOSIS — E782 Mixed hyperlipidemia: Secondary | ICD-10-CM

## 2023-07-04 DIAGNOSIS — I2510 Atherosclerotic heart disease of native coronary artery without angina pectoris: Secondary | ICD-10-CM

## 2023-07-04 DIAGNOSIS — M1000 Idiopathic gout, unspecified site: Secondary | ICD-10-CM

## 2023-07-04 MED ORDER — LOSARTAN POTASSIUM 25 MG PO TABS: 25.0000 mg | ORAL_TABLET | Freq: Every day | ORAL | 3 refills | Status: DC

## 2023-07-04 NOTE — Patient Instructions (Signed)
Medication Instructions:  DECREASE Losartan/Cozaar to 25mg  daily *If you need a refill on your cardiac medications before your next appointment, please call your pharmacy*   Lab Work: Lipids, ALT, BMET, Uric acid level today If you have labs (blood work) drawn today and your tests are completely normal, you will receive your results only by: MyChart Message (if you have MyChart) OR A paper copy in the mail If you have any lab test that is abnormal or we need to change your treatment, we will call you to review the results.   Testing/Procedures: NONE   Follow-Up: At [facility], you and your health needs are our priority.  As part of our continuing mission to provide you with exceptional heart care, we have created designated Provider Care Teams.  These Care Teams include your primary Cardiologist (physician) and Advanced Practice Providers (APPs -  Physician Assistants and Nurse Practitioners) who all work together to provide you with the care you need, when you need it.  We recommend signing up for the patient portal called "MyChart".  Sign up information is provided on this After Visit Summary.  MyChart is used to connect with patients for Virtual Visits (Telemedicine).  Patients are able to view lab/test results, encounter notes, upcoming appointments, etc.  Non-urgent messages can be sent to your provider as well.   To learn more about what you can do with MyChart, go to [email or web address].    Your next appointment:   6 month(s)  Provider:   Jari Favre, PA-C, Ronie Spies, PA-C, Robin Searing, NP, Eligha Bridegroom, NP, Tereso Newcomer, PA-C, or Perlie Gold, PA-C     Then, Kristeen Miss, MD will plan to see you again in 1 year(s).

## 2023-07-25 ENCOUNTER — Other Ambulatory Visit: Payer: Self-pay | Admitting: Cardiovascular Disease

## 2023-07-25 DIAGNOSIS — I480 Paroxysmal atrial fibrillation: Secondary | ICD-10-CM

## 2023-07-25 NOTE — Telephone Encounter (Signed)
Prescription refill request for Eliquis received. Indication:afib Last office visit:7/24 Scr:1.3  7/24 Age: 77 Weight:79.8  kg  Prescription refilled

## 2023-12-10 ENCOUNTER — Other Ambulatory Visit: Payer: Self-pay | Admitting: Student

## 2023-12-10 DIAGNOSIS — I480 Paroxysmal atrial fibrillation: Secondary | ICD-10-CM

## 2023-12-10 MED ORDER — APIXABAN 5 MG PO TABS
5.0000 mg | ORAL_TABLET | Freq: Two times a day (BID) | ORAL | 1 refills | Status: DC
Start: 2023-12-10 — End: 2024-02-06

## 2023-12-15 ENCOUNTER — Ambulatory Visit (HOSPITAL_COMMUNITY)
Admission: RE | Admit: 2023-12-15 | Discharge: 2023-12-15 | Disposition: A | Discharge status: Home / Self Care | Payer: Medicare Other | Source: Ambulatory Visit | Attending: Physician Assistant | Admitting: Physician Assistant

## 2023-12-15 ENCOUNTER — Other Ambulatory Visit (HOSPITAL_COMMUNITY): Payer: Self-pay | Admitting: *Deleted

## 2023-12-15 ENCOUNTER — Encounter (HOSPITAL_COMMUNITY): Payer: Self-pay | Admitting: Physician Assistant

## 2023-12-15 VITALS — BP 132/80 | HR 57 | Ht 69.5 in | Wt 183.4 lb

## 2023-12-15 DIAGNOSIS — I10 Essential (primary) hypertension: Secondary | ICD-10-CM | POA: Insufficient documentation

## 2023-12-15 DIAGNOSIS — I2510 Atherosclerotic heart disease of native coronary artery without angina pectoris: Secondary | ICD-10-CM | POA: Insufficient documentation

## 2023-12-15 DIAGNOSIS — Z79899 Other long term (current) drug therapy: Secondary | ICD-10-CM | POA: Diagnosis not present

## 2023-12-15 DIAGNOSIS — I4819 Other persistent atrial fibrillation: Secondary | ICD-10-CM | POA: Diagnosis not present

## 2023-12-15 DIAGNOSIS — D6869 Other thrombophilia: Secondary | ICD-10-CM | POA: Diagnosis not present

## 2023-12-15 DIAGNOSIS — Z7901 Long term (current) use of anticoagulants: Secondary | ICD-10-CM | POA: Insufficient documentation

## 2023-12-15 DIAGNOSIS — Z951 Presence of aortocoronary bypass graft: Secondary | ICD-10-CM | POA: Insufficient documentation

## 2023-12-15 DIAGNOSIS — I38 Endocarditis, valve unspecified: Secondary | ICD-10-CM | POA: Diagnosis not present

## 2023-12-15 DIAGNOSIS — Z5181 Encounter for therapeutic drug level monitoring: Secondary | ICD-10-CM | POA: Insufficient documentation

## 2023-12-15 LAB — CBC
HCT: 45.3 % (ref 39.0–52.0)
Hemoglobin: 14.6 g/dL (ref 13.0–17.0)
MCH: 31.8 pg (ref 26.0–34.0)
MCHC: 32.2 g/dL (ref 30.0–36.0)
MCV: 98.7 fL (ref 80.0–100.0)
Platelets: 153 10*3/uL (ref 150–400)
RBC: 4.59 MIL/uL (ref 4.22–5.81)
RDW: 13.2 % (ref 11.5–15.5)
WBC: 5.2 10*3/uL (ref 4.0–10.5)
nRBC: 0 % (ref 0.0–0.2)

## 2023-12-15 LAB — BASIC METABOLIC PANEL
Anion gap: 10 (ref 5–15)
BUN: 25 mg/dL — ABNORMAL HIGH (ref 8–23)
CO2: 25 mmol/L (ref 22–32)
Calcium: 9.5 mg/dL (ref 8.9–10.3)
Chloride: 103 mmol/L (ref 98–111)
Creatinine, Ser: 1.33 mg/dL — ABNORMAL HIGH (ref 0.61–1.24)
GFR, Estimated: 56 mL/min — ABNORMAL LOW (ref >60)
Glucose, Bld: 112 mg/dL — ABNORMAL HIGH (ref 70–99)
Potassium: 3.7 mmol/L (ref 3.5–5.1)
Sodium: 138 mmol/L (ref 135–145)

## 2023-12-15 LAB — MAGNESIUM: Magnesium: 2.1 mg/dL (ref 1.7–2.4)

## 2023-12-15 MED ORDER — POTASSIUM CHLORIDE ER 10 MEQ PO TBCR: 10.0000 meq | EXTENDED_RELEASE_TABLET | Freq: Every day | ORAL | 3 refills | Status: DC

## 2023-12-15 MED ORDER — MAGNESIUM CHLORIDE 64 MG PO TABS: 1.0000 | ORAL_TABLET | Freq: Every day | ORAL | Status: AC

## 2023-12-15 NOTE — Addendum Note (Signed)
 Encounter addended by: Shona Simpson, RN on: 12/15/2023 10:12 AM  Actions taken: Order list changed

## 2023-12-15 NOTE — Progress Notes (Signed)
 Primary Care Physician: Dorina Loving, PA-C Primary Cardiologist: Aleene Passe, MD Electrophysiologist: Will Gladis Norton, MD  Referring Physician: Dr Passe   Dean [name] is a 78 y.o. male with a history of CAD s/p CABG 2018, HLD, HTN, atrial fibrillation who presents for follow up in the [facility].  Patient has been maintained on dofetilide . He is on Eliquis  for a CHADS2VASC score of 4.  On follow up today, patient reports that he has done well since his last visit. He states that he can't even remember the last time he had an afib episode. He denies any bleeding issues on anticoagulation. He has had issues with diarrhea and loose stools for the past year. He does have a history of a sigmoid colectomy in 2019.  Today, he denies symptoms of palpitations, chest pain, shortness of breath, orthopnea, PND, lower extremity edema, presyncope, syncope, snoring, daytime somnolence, bleeding, or neurologic sequela. The patient is tolerating medications without difficulties and is otherwise without complaint today.    Atrial Fibrillation Risk Factors:  he does not have symptoms or diagnosis of sleep apnea. he does not have a history of rheumatic fever.   Atrial Fibrillation Management history:  Previous antiarrhythmic drugs: dofetilide   Previous cardioversions: none Previous ablations: none Anticoagulation history: Eliquis   ROS- All systems are reviewed and negative except as per the HPI above.   Physical Exam: BP 132/80   Pulse (!) 57   Ht 5' 9.5 (1.765 m)   Wt 83.2 kg   BMI 26.70 kg/m   GEN: Well nourished, well developed in no acute distress NECK: No JVD; No carotid bruits CARDIAC: Regular rate and rhythm, no murmurs, rubs, gallops RESPIRATORY:  Clear to auscultation without rales, wheezing or rhonchi  ABDOMEN: Soft, non-tender, non-distended EXTREMITIES:  No edema; No deformity    Wt Readings from Last 3 Encounters:  12/15/23 83.2  kg  07/04/23 79.8 kg  06/15/23 79.8 kg     EKG today demonstrates  SB, 1st degree AV block Vent. rate 57 BPM PR interval 212 ms QRS duration 128 ms QT/QTcB 478/465 ms   Echo 03/24/20 demonstrated   1. Improvement of the LVEF noted as compared to echo from 2018. Segmental wall motion involving lateral and inferior wall documented on echo from 2018 not seen on this study. Left ventricular ejection fraction, by  estimation, is 55 to 60%. The left ventricle has normal function. The left ventricle has no regional wall motion abnormalities. There is mild left ventricular hypertrophy. Left ventricular diastolic parameters were normal.   2. Right ventricular systolic function is normal. The right ventricular  size is normal. There is normal pulmonary artery systolic pressure.   3. The mitral valve is normal in structure. Moderate mitral valve  regurgitation. No evidence of mitral stenosis.   4. The aortic valve is normal in structure. Aortic valve regurgitation is  trivial. No aortic stenosis is present.   5. The inferior vena cava is normal in size with greater than 50%  respiratory variability, suggesting right atrial pressure of 3 mmHg.    CHA2DS2-VASc Score = 4  The patient's score is based upon: CHF History: 0 HTN History: 1 Diabetes History: 0 Stroke History: 0 Vascular Disease History: 1 Age Score: 2 Gender Score: 0       ASSESSMENT AND PLAN: Persistent Atrial Fibrillation (ICD10:  I48.19) The patient's CHA2DS2-VASc score is 4, indicating a 4.8% annual risk of stroke.   S/p dofetilide  loading 2022 Patient appears to be maintaining  SR Continue dofetilide  500 mcg BID Continue Eliquis  5 mg BID Kardia mobile for home monitoring   Secondary Hypercoagulable State (ICD10:  [phone]) The patient is at significant risk for stroke/thromboembolism based upon his CHA2DS2-VASc Score of 4.  Continue Apixaban  (Eliquis ).   High risk medication monitoring QT stable on dofetilide  Check  bmet/mag today Will try changing magnesium  oxide to magnesium  chloride to see if this helps his loose stools. He also plans to follow up with his GI specialist.   HTN Stable on current regimen  Valvular heart disease Moderate MR Followed by Dr Alveta  CAD S/p CABG 2018 No anginal symptoms   Follow up in the AF clinic in 6 months.    Daril Kicks PA-C Afib Clinic [facility] [address] [place], [place] [address] [phone]

## 2023-12-21 ENCOUNTER — Telehealth: Payer: Self-pay | Admitting: Medical

## 2023-12-21 NOTE — Telephone Encounter (Signed)
 Copied from CRM [id number]. Topic: Medicare AWV >> Dec 21, 2023  2:35 PM Juliana Ocean wrote: Reason for CRM: Called LVM 12/21/2023 to schedule AWV. Please schedule Virtual or Telehealth visits ONLY.   Rosalee Collins; Care Guide Ambulatory Clinical Support [place] [facility] Direct Dial: [phone]

## 2023-12-30 ENCOUNTER — Ambulatory Visit (HOSPITAL_COMMUNITY)
Admission: RE | Admit: 2023-12-30 | Discharge: 2023-12-30 | Disposition: A | Discharge status: Home / Self Care | Payer: Medicare Other | Source: Ambulatory Visit | Attending: Physician Assistant | Admitting: Physician Assistant

## 2023-12-30 DIAGNOSIS — I4819 Other persistent atrial fibrillation: Secondary | ICD-10-CM | POA: Diagnosis not present

## 2023-12-30 DIAGNOSIS — D6869 Other thrombophilia: Secondary | ICD-10-CM | POA: Insufficient documentation

## 2023-12-30 DIAGNOSIS — Z5181 Encounter for therapeutic drug level monitoring: Secondary | ICD-10-CM | POA: Diagnosis not present

## 2023-12-30 DIAGNOSIS — Z79899 Other long term (current) drug therapy: Secondary | ICD-10-CM | POA: Diagnosis not present

## 2023-12-30 LAB — BASIC METABOLIC PANEL
Anion gap: 10 (ref 5–15)
BUN: 28 mg/dL — ABNORMAL HIGH (ref 8–23)
CO2: 25 mmol/L (ref 22–32)
Calcium: 9.7 mg/dL (ref 8.9–10.3)
Chloride: 105 mmol/L (ref 98–111)
Creatinine, Ser: 1.42 mg/dL — ABNORMAL HIGH (ref 0.61–1.24)
GFR, Estimated: 52 mL/min — ABNORMAL LOW (ref >60)
Glucose, Bld: 107 mg/dL — ABNORMAL HIGH (ref 70–99)
Potassium: 4.5 mmol/L (ref 3.5–5.1)
Sodium: 140 mmol/L (ref 135–145)

## 2024-01-02 ENCOUNTER — Other Ambulatory Visit: Payer: Self-pay | Admitting: Medical

## 2024-01-31 ENCOUNTER — Encounter: Payer: Self-pay | Admitting: Cardiovascular Disease

## 2024-02-05 ENCOUNTER — Encounter: Payer: Self-pay | Admitting: Cardiovascular Disease

## 2024-02-05 NOTE — Progress Notes (Unsigned)
 Cardiology Office Note   Date:  02/06/2024   ID:  Dean [name], DOB [birth year], MRN [id number]  PCP:  Dean Richters, PA-C  Cardiologist:   Kristeen Miss, MD   Chief Complaint  Patient presents with   Coronary Artery Disease   Hyperlipidemia   Atrial Fibrillation   Problem list 1. CAD  - CABG Nov. 2018 2.   Post of atrial fib  3   Hyperlipidemia 4. Gout 5. GERD  6.  CVA - prior to diagnosis of atrial fib      Dean [name] is a 78 y.o. male who presents for further evaluation of some chest pain and shortness of breath. I have reviewed records from [facility] center.  Has intermittant symptoms of DOE and CP Has occasional palpitations  Is hoarse at times  Has had a stress test - normal  Echo was normal .   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   Oct. 9, 2017:  Has started exercising some.   Is having gout issues which limits his exercise at time  Able to walk around without any significant CP or dyspnea.    Aug. 20, 2018  Dean [name] is seen back for follow-up visit for his premature ventricular contractions. Is taking Uloric for gouty tophi, Makes him nauseated, fatigued.   Has not had any exertional CP recently  Is on Indocin Saw his GP , saw something odd on ECG and he was sent here.  ECG here shows NSR , NS ST abn.  PVCs  Does not exercise,   No hx of CP or DOE Has rare episodes of CP when he is lying down .   March 08, 2018: Doing well from a cardiac standpont  Has had CABG , post op AF .   Nicely from a cardiac standpoint but then developed diverticulitis requiring surgery in mid March.  He was told by somebody during that hospitalization that he may have had a brief episode of atrial fibrillation.  There are no recorded episodes of atrial for ablation on  the telemetry strips and no EKGs suggesting atrial fibrillation.   He related a couple other complaints.  He has a bad taste in his mouth and has no appetite.  He also has had profound itching.  He thinks that it might be due to the warfarin therapy.  June 07, 2018:  Dean [name] is seen today for follow-up of his coronary artery disease and postoperative atrial fibrillation. Feeling great,   Walks 6 days a week at a rapid pace. No CP or dyspnea   Several concerns  - has some tenderness along his SVG harvest site on his left lower leg. Has some sternal tenderness also .   Jan. 10, 2020  Dean [name] is seen today for follow-up of his coronary artery disease and coronary artery bypass grafting.  He also has a history of hyperlipidemia and gout. No recent issues with gout.   Controlled with Uloric  Walks 1 hr a day ( walk / run)  No DOE on hills , no angina  Still has some chest wall tenderness. Has SVG harvest site numbness  Having back issues.    Wonders if its due to Atorvastatin .   March 21, 2020:  Dean [name]  is seen today for follow-up of his coronary artery disease, coronary artery bypass grafting, atrial fibrillation, hyperlipidemia and gout.    He has a history of chronic systolic congestive heart failure following his myocardial infarction in 2018. He has a history of postoperative atrial fibrillation and and is back in atrial fibrillation today.  He is back in atrial fibrillation today. He cannot tell that he is   We will be restarting Eliquis 5 mg twice a day Discontinue aspirin Repeat echocardiogram for further assessment of his CHF.  Consider adding low-dose ARB if his LV functions is still depressed. We will have him see an APP in 1 month to determine whether or not he needs cardioversion and I will see him again in 6 months.  Nov. 16, 2021: Dean [name] is seen today for follow up of his CAD, CHF, PAF  His original EF was 35%. Follow up echo in April, 2021 showed an EF of  55-60%. Moderate MR  On Eliquis  He has occasional episodes of tachycardia. Fit bit shows HR of 160 We discussed Kardia monitor Propranolol 10 mg tabs QID PRN   May 08, 2021: Dean [name] is seen today for follow up visit  Has had his first shingles shot Had a bad reaction to the shingles vaccine   Dec. 5, 2022 Dean [name] is seen today for follow up of his CAD , PAF , CHF BP is elevated at time  Still eating some salty foods.  His diet has improved.      Is walking 4 miles most days a week .  No CP , no dyspnea No recentl atrial fib , tikosyn is keeping him well controlled.   December 30, 2022: Dean [name] is seen today for follow-up of his coronary artery disease, paroxysmal atrial fibrillation, congestive heart failure.  He is on Tikosyn.  Will draw magnesium and BMP today.  Was having lightheadedness  Dr Elberta Fortis recently lowered his Losartan.   Has had 4 covid shots Has never had COVID . I did not recommend this booster   Labs from last week  Chol = 149 HDL = 49 LDL = 79 Trigs 115  LP(a) on December 21, 2022 is 18.2.  On atorvastatin 10 mg a day , zetia 10 mg a day  Will increase atorva to 80 Check lipids  in 3 months    July 04, 2023  Dean [name] is seen for follow up of his CAD, atrial fib  Hyperlipidemia  Recently saw Dean [name] , PA in the Afib clinic Potassium and mag levels were normal   Has developed orthostatic hypotension . He thinks its related to increasing his atorvastatin . Drinks water, Advised adding electrolytes    Developed hives when we increased his Atorvastatin from 10 mg to 80 mg  We reduced his dose to Atorva 40 mg   He finds PAF occasionally with his [employer] with EP He is already on max dose Tikosyn.  I have asked him to check in with EP if he has further concerns.  He may be a candidate for A-fib ablation. He says the PAF is not bad enough to be a problem   Has developed frequent diarrhea  Has gout.  Wants to have a uric acid checked    Wants to have an occational ETOH.    February 06, 2024  Dean [name] is seen for his CAD, PAF, HLD  BMP, mag level    ECG - s. Huston Foley ,  QTC is .465 sec At the Afib  clinic in Jan .   Still having some itchy rash  He thinks its Eliquis  Will switch to Pradaxa generic      Past Medical History:  Diagnosis Date   Acid reflux    history of   Anemia    Atrial fibrillation (HCC) 10/2017   RVR   Coronary artery disease    Diverticulitis 11/26/2017   Gout    Hepatitis A age 61   History of kidney stones    Hyperlipemia    Hypogonadism male    Irregular heart beat    Ischemic cardiomyopathy    Non compliance with medical treatment    Non-STEMI (non-ST elevated myocardial infarction) (HCC) 10/2017   Pelvic abscess in male [facility])    percutaneous drain placement    Past Surgical History:  Procedure Laterality Date   BLADDER REPAIR  02/17/2018   Procedure: BLADDER REPAIR;  Surgeon: Andria Meuse, MD;  Location: [facility] ORS;  Service: General;;   BLADDER REPAIR  02/17/2018   Procedure: pelvic exploration, assist with bladder repair ;  Surgeon: Heloise Purpura, MD;  Location: [facility] ORS;  Service: Urology;;   COLONOSCOPY  ~ 2014   Dr Noe Gens in [facility].  Diverticulosis.  pt denies colon polyps.     CORONARY ARTERY BYPASS GRAFT N/A 10/26/2017   Procedure: CORONARY ARTERY BYPASS GRAFTING (CABG) x four , using left internal mammary artery and bilateral legs greater saphenous vein harvested endoscopically;  Surgeon: Alleen Borne, MD;  Location: [facility] OR;  Service: Open Heart Surgery;  Laterality: N/A;   CYSTOSCOPY W/ URETERAL STENT PLACEMENT Bilateral 02/17/2018   Procedure: CYSTOSCOPY WITH RETROGRADE PYELOGRAM/URETERAL STENT PLACEMENT;  Surgeon: Crist Fat, MD;  Location: [facility]S;  Service: Urology;  Laterality: Bilateral;   IR CATHETER TUBE CHANGE  01/27/2018   IR RADIOLOGIST EVAL & MGMT  12/21/2017   LAPAROSCOPIC SIGMOID COLECTOMY N/A 02/17/2018   Procedure: LAPAROSCOPIC SIGMOID  COLECTOMY  ERAS PATHWAY TAKEDOWN OF COLOVESSICAL COLOCUTANEOUS FISTULA;  Surgeon: Andria Meuse, MD;  Location: [facility] ORS;  Service: General;  Laterality: N/A;   LEFT HEART CATH AND CORONARY ANGIOGRAPHY N/A 10/25/2017   Procedure: LEFT HEART CATH AND CORONARY ANGIOGRAPHY;  Surgeon: Marykay Lex, MD;  Location: [facility] INVASIVE CV LAB;  Service: Cardiovascular;  Laterality: N/A;   SIGMOIDOSCOPY N/A 02/17/2018   Procedure: FLEX SIGMOIDOSCOPY;  Surgeon: Andria Meuse, MD;  Location: [facility]S;  Service: General;  Laterality: N/A;   TEE WITHOUT CARDIOVERSION N/A 10/26/2017   Procedure: TRANSESOPHAGEAL ECHOCARDIOGRAM (TEE);  Surgeon: Alleen Borne, MD;  Location[facility] OR;  Service: Open Heart Surgery;  Laterality: N/A;     Current Outpatient Medications  Medication Sig Dispense Refill   allopurinol (ZYLOPRIM) 100 MG tablet TAKE 1 TABLET BY MOUTH DAILY 100 tablet 2   atorvastatin (LIPITOR) 40 MG tablet Take 1 tablet (40 mg total) by mouth daily. 90 tablet 3   dabigatran (PRADAXA) 150 MG CAPS capsule Take 1 capsule (150 mg total) by mouth 2 (two) times daily. 180 capsule 3   dofetilide (TIKOSYN) 500 MCG capsule Take 1 capsule (500 mcg total) by mouth 2 (two) times daily. 180 capsule 2   ezetimibe (ZETIA) 10 MG tablet TAKE ONE-HALF TABLET BY MOUTH  DAILY 100 tablet 2   losartan (COZAAR) 25 MG tablet Take 1 tablet (25 mg total) by mouth daily. 90 tablet 3   Magnesium Chloride 64 MG TABS Take 1 tablet by mouth daily.     potassium chloride (KLOR-CON) 10 MEQ tablet  Take 1 tablet (10 mEq total) by mouth daily. 90 tablet 3   psyllium (METAMUCIL) 58.6 % powder Take 1 packet by mouth daily.     vitamin B-12 (CYANOCOBALAMIN) 500 MCG tablet Take 500 mcg by mouth daily.     No current facility-administered medications for this visit.    Allergies:   Crestor [rosuvastatin calcium]    Social History:  The patient  reports that he has quit smoking. He has never used smokeless tobacco. He reports that  he does not currently use alcohol. He reports that he does not use drugs.   Family History:  The patient's family history includes Alzheimer's disease in his maternal grandmother and mother; Hyperlipidemia in his brother and mother.    ROS:       Physical Exam: Blood pressure 120/78, pulse 64, height 5' 9.5" (1.765 m), weight 181 lb (82.1 kg).       GEN:  Well nourished, well developed in no acute distress HEENT: Normal NECK: No JVD; No carotid bruits LYMPHATICS: No lymphadenopathy CARDIAC: RRR , no murmurs, rubs, gallops RESPIRATORY:  Clear to auscultation without rales, wheezing or rhonchi  ABDOMEN: Soft, non-tender, non-distended MUSCULOSKELETAL:  No edema; No deformity  SKIN: Warm and dry NEUROLOGIC:  Alert and oriented x 3     EKG:           Recent Labs: 04/05/2023: TSH 2.69 07/04/2023: ALT 13 12/15/2023: Hemoglobin 14.6; Magnesium 2.1; Platelets 153 12/30/2023: BUN 28; Creatinine, Ser 1.42; Potassium 4.5; Sodium 140    Lipid Panel    Component Value Date/Time   CHOL 111 07/04/2023 1018   TRIG 95 07/04/2023 1018   HDL 48 07/04/2023 1018   CHOLHDL 2.3 07/04/2023 1018   CHOLHDL 3.3 10/25/2017 0341   VLDL 21 10/25/2017 0341   LDLCALC 45 07/04/2023 1018      Wt Readings from Last 3 Encounters:  02/06/24 181 lb (82.1 kg)  12/15/23 183 lb 6.4 oz (83.2 kg)  07/04/23 176 lb (79.8 kg)      Other studies Reviewed: Additional studies/ records that were reviewed today include: . Review of the above records demonstrates:   ASSESSMENT AND PLAN:  1.  CAD - s/p CABG  -     denies having any angina.  He walks for an hour a day 6 days a week.  2. Atrial fib;     remains in normal sinus rhythm.  He follows with the A-fib clinic every 6 months.  His last potassium and magnesium levels were okay.  He is on potassium supplement now.  EKG was good.    3.  Chronic systolic congestive heart failure: No signs or symptoms of heart failure.  3.  Hyperlipidemia -      cont atorvastatin and zetia   Continue   Will check lipids today.  4.  Hypertension: Blood pressure is well-controlled.  5.  History of gout.     Current medicines are reviewed at length with the patient today.  The patient does not have concerns regarding medicines.  The following changes have been made:  no change  Labs/ tests ordered today include:   Orders Placed This Encounter  Procedures   Lipid panel      Disposition:       Kristeen Miss, MD  02/06/2024 5:10 PM    [facility] [address] [place], [place], [place]  [address] Phone: [phone]; Fax: [phone]

## 2024-02-06 ENCOUNTER — Ambulatory Visit: Payer: Medicare Other | Attending: Cardiovascular Disease | Admitting: Cardiovascular Disease

## 2024-02-06 ENCOUNTER — Encounter: Payer: Self-pay | Admitting: Cardiovascular Disease

## 2024-02-06 VITALS — BP 120/78 | HR 64 | Ht 69.5 in | Wt 181.0 lb

## 2024-02-06 DIAGNOSIS — Z79899 Other long term (current) drug therapy: Secondary | ICD-10-CM | POA: Diagnosis not present

## 2024-02-06 DIAGNOSIS — I4819 Other persistent atrial fibrillation: Secondary | ICD-10-CM | POA: Diagnosis not present

## 2024-02-06 DIAGNOSIS — I480 Paroxysmal atrial fibrillation: Secondary | ICD-10-CM | POA: Diagnosis not present

## 2024-02-06 DIAGNOSIS — E782 Mixed hyperlipidemia: Secondary | ICD-10-CM

## 2024-02-06 DIAGNOSIS — D6869 Other thrombophilia: Secondary | ICD-10-CM

## 2024-02-06 LAB — LIPID PANEL
Chol/HDL Ratio: 2.3 ratio (ref 0.0–5.0)
Cholesterol, Total: 126 mg/dL (ref 100–199)
HDL: 55 mg/dL (ref >39)
LDL Chol Calc (NIH): 55 mg/dL (ref 0–99)
Triglycerides: 85 mg/dL (ref 0–149)
VLDL Cholesterol Cal: 16 mg/dL (ref 5–40)

## 2024-02-06 MED ORDER — DABIGATRAN ETEXILATE MESYLATE 150 MG PO CAPS
150.0000 mg | ORAL_CAPSULE | Freq: Two times a day (BID) | ORAL | 3 refills | Status: DC
Start: 2024-02-06 — End: 2024-02-09

## 2024-02-06 NOTE — Patient Instructions (Signed)
 Medication Instructions:  STOP Eliquis START Dabigitran/pradaxa 150mg  twice daily *If you need a refill on your cardiac medications before your next appointment, please call your pharmacy*  Lab Work: Lipids today If you have labs (blood work) drawn today and your tests are completely normal, you will receive your results only by: MyChart Message (if you have MyChart) OR A paper copy in the mail If you have any lab test that is abnormal or we need to change your treatment, we will call you to review the results.  Follow-Up: At [facility], you and your health needs are our priority.  As part of our continuing mission to provide you with exceptional heart care, we have created designated Provider Care Teams.  These Care Teams include your primary Cardiologist (physician) and Advanced Practice Providers (APPs -  Physician Assistants and Nurse Practitioners) who all work together to provide you with the care you need, when you need it.  Your next appointment:   1 year(s)  Provider:   Kristeen Miss, MD      1st Floor: - Lobby - Registration  - Pharmacy  - Lab - Cafe  2nd Floor: - PV Lab - Diagnostic Testing (echo, CT, nuclear med)  3rd Floor: - Vacant  4th Floor: - TCTS (cardiothoracic surgery) - AFib Clinic - Structural Heart Clinic - Vascular Surgery  - Vascular Ultrasound  5th Floor: - HeartCare Cardiology (general and EP) - Clinical Pharmacy for coumadin, hypertension, lipid, weight-loss medications, and med management appointments    Valet parking services will be available as well.

## 2024-02-07 ENCOUNTER — Encounter: Payer: Self-pay | Admitting: Cardiovascular Disease

## 2024-02-09 MED ORDER — RIVAROXABAN 20 MG PO TABS: 20.0000 mg | ORAL_TABLET | Freq: Every day | ORAL | 3 refills | Status: DC

## 2024-02-19 ENCOUNTER — Other Ambulatory Visit: Payer: Self-pay | Admitting: Cardiovascular Disease

## 2024-02-22 ENCOUNTER — Other Ambulatory Visit (HOSPITAL_COMMUNITY): Payer: Self-pay | Admitting: *Deleted

## 2024-02-22 MED ORDER — POTASSIUM CHLORIDE ER 10 MEQ PO TBCR: 10.0000 meq | EXTENDED_RELEASE_TABLET | Freq: Every day | ORAL | 3 refills | Status: DC

## 2024-03-21 DIAGNOSIS — L821 Other seborrheic keratosis: Secondary | ICD-10-CM | POA: Diagnosis not present

## 2024-03-21 DIAGNOSIS — D1801 Hemangioma of skin and subcutaneous tissue: Secondary | ICD-10-CM | POA: Diagnosis not present

## 2024-03-21 DIAGNOSIS — L570 Actinic keratosis: Secondary | ICD-10-CM | POA: Diagnosis not present

## 2024-03-21 DIAGNOSIS — Z129 Encounter for screening for malignant neoplasm, site unspecified: Secondary | ICD-10-CM | POA: Diagnosis not present

## 2024-04-16 ENCOUNTER — Other Ambulatory Visit (HOSPITAL_COMMUNITY): Payer: Self-pay

## 2024-04-16 MED ORDER — DOFETILIDE 500 MCG PO CAPS: 500.0000 ug | ORAL_CAPSULE | Freq: Two times a day (BID) | ORAL | 1 refills | Status: DC

## 2024-06-12 ENCOUNTER — Ambulatory Visit (HOSPITAL_COMMUNITY)
Admission: RE | Admit: 2024-06-12 | Discharge: 2024-06-12 | Disposition: A | Discharge status: Home / Self Care | Source: Ambulatory Visit | Attending: Physician Assistant | Admitting: Physician Assistant

## 2024-06-12 VITALS — BP 152/78 | HR 48 | Ht 69.5 in | Wt 183.4 lb

## 2024-06-12 DIAGNOSIS — I4819 Other persistent atrial fibrillation: Secondary | ICD-10-CM | POA: Diagnosis not present

## 2024-06-12 DIAGNOSIS — D6869 Other thrombophilia: Secondary | ICD-10-CM

## 2024-06-12 DIAGNOSIS — Z79899 Other long term (current) drug therapy: Secondary | ICD-10-CM | POA: Diagnosis not present

## 2024-06-12 DIAGNOSIS — I4891 Unspecified atrial fibrillation: Secondary | ICD-10-CM

## 2024-06-12 DIAGNOSIS — Z5181 Encounter for therapeutic drug level monitoring: Secondary | ICD-10-CM

## 2024-06-12 NOTE — Progress Notes (Signed)
 Primary Care Physician: Dorina Loving, PA-C Primary Cardiologist: Aleene Passe, MD Electrophysiologist: Will Gladis Norton, MD  Referring Physician: Dr Passe   Dean [name] is a 78 y.o. male with a history of CAD s/p CABG 2018, HLD, HTN, atrial fibrillation who presents for follow up in the [facility] Atrial Fibrillation [facility].  Patient has been maintained on dofetilide . He is on Xarelto  for stroke prevention.   Patient returns for follow up for atrial fibrillation and dofetilide  monitoring. He reports that he has done well since his last visit. He denies any interim episodes of afib. He was changed from Eliquis  to Xarelto  due to itching. His symptoms resolved. No bleeding issues.   Today, he  denies symptoms of palpitations, chest pain, shortness of breath, orthopnea, PND, lower extremity edema, dizziness, presyncope, syncope, snoring, daytime somnolence, bleeding, or neurologic sequela. The patient is tolerating medications without difficulties and is otherwise without complaint today.    Atrial Fibrillation Risk Factors:  he does not have symptoms or diagnosis of sleep apnea. he does not have a history of rheumatic fever.   Atrial Fibrillation Management history:  Previous antiarrhythmic drugs: dofetilide   Previous cardioversions: none Previous ablations: none Anticoagulation history: Eliquis , Xarelto    ROS- All systems are reviewed and negative except as per the HPI above.   Physical Exam: BP (!) 152/78   Pulse (!) 48   Ht 5' 9.5 (1.765 m)   Wt 83.2 kg   BMI 26.70 kg/m   GEN: Well nourished, well developed in no acute distress CARDIAC: Regular rate and rhythm, no murmurs, rubs, gallops RESPIRATORY:  Clear to auscultation without rales, wheezing or rhonchi  ABDOMEN: Soft, non-tender, non-distended EXTREMITIES:  No edema; No deformity    Wt Readings from Last 3 Encounters:  06/12/24 83.2 kg  02/06/24 82.1 kg  12/15/23 83.2 kg     EKG today  demonstrates  SB, 1st degree AV block Vent. rate 48 BPM PR interval 228 ms QRS duration 128 ms QT/QTcB 484/432 ms   Echo 03/24/20 demonstrated   1. Improvement of the LVEF noted as compared to echo from 2018. Segmental wall motion involving lateral and inferior wall documented on echo from 2018 not seen on this study. Left ventricular ejection fraction, by  estimation, is 55 to 60%. The left ventricle has normal function. The left ventricle has no regional wall motion abnormalities. There is mild left ventricular hypertrophy. Left ventricular diastolic parameters were normal.   2. Right ventricular systolic function is normal. The right ventricular  size is normal. There is normal pulmonary artery systolic pressure.   3. The mitral valve is normal in structure. Moderate mitral valve  regurgitation. No evidence of mitral stenosis.   4. The aortic valve is normal in structure. Aortic valve regurgitation is  trivial. No aortic stenosis is present.   5. The inferior vena cava is normal in size with greater than 50%  respiratory variability, suggesting right atrial pressure of 3 mmHg.    CHA2DS2-VASc Score = 4  The patient's score is based upon: CHF History: 0 HTN History: 1 Diabetes History: 0 Stroke History: 0 Vascular Disease History: 1 Age Score: 2 Gender Score: 0       ASSESSMENT AND PLAN: Persistent Atrial Fibrillation (ICD10:  I48.19) The patient's CHA2DS2-VASc score is 4, indicating a 4.8% annual risk of stroke.   S/p dofetilide  loading 2022 Patient appears to be maintaining SR Continue dofetilide  500 mcg BID Continue Xarelto  20 mg daily Kardia mobile for home monitoring.  Secondary Hypercoagulable State (ICD10:  D68.69) The patient is at significant risk for stroke/thromboembolism based upon his CHA2DS2-VASc Score of 4.  Continue Rivaroxaban  (Xarelto ). No bleeding issues.   High Risk Medication Monitoring (ICD 10: Z79.899) QT interval on ECG acceptable for dofetilide   monitoring. Check bmet/mag today.     HTN Mildly elevated today, well controlled at home and at previous visits. No change today.  VHD Moderate MR Followed by Dr Alveta  CAD S/p CABG 2018 No anginal symptoms Followed by Dr Alveta   Follow up with Dr Inocencio in 6 months AF clinic in one year.    Daril Kicks PA-C Afib Clinic [facility] [address] [place], [place] [address] [phone]

## 2024-06-13 ENCOUNTER — Ambulatory Visit (HOSPITAL_COMMUNITY): Payer: Self-pay | Admitting: Physician Assistant

## 2024-06-13 LAB — BASIC METABOLIC PANEL WITH GFR
BUN/Creatinine Ratio: 13 (ref 10–24)
BUN: 17 mg/dL (ref 8–27)
CO2: 22 mmol/L (ref 20–29)
Calcium: 9.5 mg/dL (ref 8.6–10.2)
Chloride: 103 mmol/L (ref 96–106)
Creatinine, Ser: 1.31 mg/dL — ABNORMAL HIGH (ref 0.76–1.27)
Glucose: 101 mg/dL — ABNORMAL HIGH (ref 70–99)
Potassium: 5.1 mmol/L (ref 3.5–5.2)
Sodium: 140 mmol/L (ref 134–144)
eGFR: 56 mL/min/1.73 — ABNORMAL LOW (ref >59)

## 2024-06-13 LAB — MAGNESIUM: Magnesium: 1.9 mg/dL (ref 1.6–2.3)

## 2024-07-17 ENCOUNTER — Other Ambulatory Visit: Payer: Self-pay

## 2024-07-17 MED ORDER — EZETIMIBE 10 MG PO TABS: 5.0000 mg | ORAL_TABLET | Freq: Every day | ORAL | 1 refills | Status: AC

## 2024-08-30 ENCOUNTER — Telehealth: Payer: Self-pay | Admitting: Physician Assistant

## 2024-08-30 NOTE — Telephone Encounter (Signed)
 Called and gave the patient the information below Good morning, Your new Cardiologist will be Dr Jerel Croitoru. Your follow up appointment will be due around the end of March 2026. The schedule for the providers is not out for March yet. Can get an appointment scheduled in December/January Thank you Izetta, RN

## 2024-08-30 NOTE — Telephone Encounter (Signed)
 Pt would like to know who his new gen cards will be since pt is a previous pt of Dr. Alveta. Pt would like this information emailed to him at [email or web address].

## 2024-08-31 ENCOUNTER — Encounter: Payer: Self-pay | Admitting: Medical

## 2024-08-31 ENCOUNTER — Ambulatory Visit: Admitting: Medical

## 2024-08-31 VITALS — BP 135/78 | HR 69 | Resp 15 | Ht 69.5 in | Wt 183.4 lb

## 2024-08-31 DIAGNOSIS — R944 Abnormal results of kidney function studies: Secondary | ICD-10-CM

## 2024-08-31 DIAGNOSIS — R197 Diarrhea, unspecified: Secondary | ICD-10-CM

## 2024-08-31 DIAGNOSIS — Z860100 Personal history of colon polyps, unspecified: Secondary | ICD-10-CM

## 2024-08-31 DIAGNOSIS — E785 Hyperlipidemia, unspecified: Secondary | ICD-10-CM

## 2024-08-31 DIAGNOSIS — R195 Other fecal abnormalities: Secondary | ICD-10-CM

## 2024-08-31 DIAGNOSIS — I4891 Unspecified atrial fibrillation: Secondary | ICD-10-CM

## 2024-08-31 LAB — CBC WITH DIFFERENTIAL/PLATELET
Basophils Absolute: 0 K/uL (ref 0.0–0.1)
Basophils Relative: 0.3 % (ref 0.0–3.0)
Eosinophils Absolute: 0.1 K/uL (ref 0.0–0.7)
Eosinophils Relative: 3.4 % (ref 0.0–5.0)
HCT: 41.7 % (ref 39.0–52.0)
Hemoglobin: 14 g/dL (ref 13.0–17.0)
Lymphocytes Relative: 41.1 % (ref 12.0–46.0)
Lymphs Abs: 1.8 K/uL (ref 0.7–4.0)
MCHC: 33.6 g/dL (ref 30.0–36.0)
MCV: 96.9 fl (ref 78.0–100.0)
Monocytes Absolute: 0.4 K/uL (ref 0.1–1.0)
Monocytes Relative: 8.5 % (ref 3.0–12.0)
Neutro Abs: 2 K/uL (ref 1.4–7.7)
Neutrophils Relative %: 46.7 % (ref 43.0–77.0)
Platelets: 133 K/uL — ABNORMAL LOW (ref 150.0–400.0)
RBC: 4.3 Mil/uL (ref 4.22–5.81)
RDW: 14 % (ref 11.5–15.5)
WBC: 4.4 K/uL (ref 4.0–10.5)

## 2024-08-31 NOTE — Progress Notes (Signed)
 Subjective:    Patient ID: Dean [name], male    DOB: [birth year], 78 y.o.   MRN: [id number]  HPI  Dean [name] is a 78 year old male   He has experienced a change in bowel habits over the past four to four and a half months, coinciding with a switch in medication to Xarelto . Bowel movements are described as passing slugs or it's pudding' on good days, with severe diarrhea occurring once a week. Abdominal pain is noted around his middle flank are during these episodes, which he associates with his history of having ten inches of his colon removed due to diverticular disease years ago.  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 His last colonoscopy was in 2014, and he has not had a follow-up despite recommendations to repeat it in five years. He expressed concern about his current symptoms and their relation to his medical history.  He has been on Xarelto  for the past four to four and a half months after switching from Eliquis  due to severe itching. The change in medication has coincided with the worsening of his bowel symptoms. He has a history of sensitivity to medications, having previously experienced itching with Eliquis , which he tolerated for five years before the symptoms became intolerable.  No fevers, chills, or sweats are reported, but stools are very soft, bordering on diarrhea, with episodes of liquid diarrhea occurring once a week. Abdominal pain is experienced during these diarrhea episodes, which last occurred three days ago.  He maintains a careful diet, avoiding fried foods and beef, and has been eating more nuts recently. He walks six days a week for an hour and reports no issues with energy levels. He has a history of atrial fibrillation and has been on anticoagulation therapy, previously with Eliquis  and now with Xarelto .    Review of  Systems  Constitutional:  Negative for chills, fatigue and fever.  Respiratory:  Negative for chest tightness, shortness of breath and wheezing.   Cardiovascular:  Negative for chest pain and palpitations.  Gastrointestinal:  Negative for abdominal pain, constipation, diarrhea and vomiting.       None now but see hpi  Genitourinary:  Negative for dysuria.  Musculoskeletal:  Negative for back pain and myalgias.  Neurological:  Negative for dizziness, weakness and numbness.  Hematological:  Negative for adenopathy.  Psychiatric/Behavioral:  Negative for behavioral problems and dysphoric mood. The patient is not nervous/anxious and is not hyperactive.     Past Medical History:  Diagnosis Date   Acid reflux    history of   Anemia    Atrial fibrillation (HCC) 10/2017   RVR   Coronary artery disease    Diverticulitis 11/26/2017   Gout    Hepatitis A age 59   History of kidney stones    Hyperlipemia    Hypogonadism male    Irregular heart beat    Ischemic cardiomyopathy    Non compliance with medical treatment    Non-STEMI (non-ST elevated myocardial infarction) (HCC) 10/2017   Pelvic abscess in male [facility])    percutaneous drain placement     Social History   Socioeconomic History   Marital status: Single    Spouse name: Not on file   Number of children: Not on file   Years of education: Not on file   Highest education level: Some college, no degree  Occupational History   Occupation: retired  Tobacco Use   Smoking status: Former   Smokeless tobacco: Never   Tobacco comments:    Former smoker 12/15/23  Vaping Use   Vaping status: Never Used  Substance and Sexual Activity   Alcohol use: Not Currently   Drug use: No   Sexual activity: Not on file  Other Topics Concern   Not on file  Social History Narrative   Not on file   Social Drivers of Health   Financial Resource Strain: Low Risk  (08/30/2024)   Overall Financial Resource Strain (CARDIA)    Difficulty of  Paying Living Expenses: Not hard at all  Food Insecurity: No Food Insecurity (08/30/2024)   Hunger Vital Sign    Worried About Running Out of Food in the Last Year: Never true    Ran Out of Food in the Last Year: Never true  Transportation Needs: No Transportation Needs (08/30/2024)   PRAPARE - Administrator, Civil Service (Medical): No    Lack of Transportation (Non-Medical): No  Physical Activity: Sufficiently Active (08/30/2024)   Exercise Vital Sign    Days of Exercise per Week: 6 days    Minutes of Exercise per Session: 60 min  Stress: No Stress Concern Present (08/30/2024)   [employer] of Occupational Health - Occupational Stress Questionnaire    Feeling of Stress: Not at all  Social Connections: Socially Isolated (08/30/2024)   Social Connection and Isolation Panel    Frequency of Communication with Friends and Family: Once a week    Frequency of Social Gatherings with Friends and Family: Once a week    Attends Religious Services: Never    Database administrator or Organizations: No    Attends Engineer, structural: Not on file    Marital Status: Never married  Intimate Partner Violence: Not At Risk (10/19/2021)   Humiliation, Afraid, Rape, and Kick questionnaire    Fear of Current or Ex-Partner: No    Emotionally Abused: No    Physically Abused: No    Sexually Abused: No    Past Surgical History:  Procedure Laterality Date   BLADDER REPAIR  02/17/2018   Procedure: BLADDER REPAIR;  Surgeon: Teresa Lonni HERO, MD;  Location: [facility];  Service: General;;   BLADDER REPAIR  02/17/2018   Procedure: pelvic exploration, assist with bladder repair ;  Surgeon: Renda Glance, MD;  Location: [facility];  Service: Urology;;   COLONOSCOPY  ~ 2014   Dr Zulema in [facility].  Diverticulosis.  pt denies colon polyps.     CORONARY ARTERY BYPASS GRAFT N/A 10/26/2017   Procedure: CORONARY ARTERY BYPASS GRAFTING (CABG) x four , using left internal mammary artery and  bilateral legs greater saphenous vein harvested endoscopically;  Surgeon: Lucas Dorise POUR, MD;  Location: [facility] OR;  Service: Open Heart Surgery;  Laterality: N/A;   CYSTOSCOPY W/ URETERAL STENT PLACEMENT Bilateral 02/17/2018   Procedure: CYSTOSCOPY WITH RETROGRADE PYELOGRAM/URETERAL STENT PLACEMENT;  Surgeon: Cam Morene ORN, MD;  Location: [facility];  Service: Urology;  Laterality: Bilateral;   IR CATHETER TUBE CHANGE  01/27/2018   IR RADIOLOGIST EVAL & MGMT  12/21/2017   LAPAROSCOPIC SIGMOID COLECTOMY N/A 02/17/2018   Procedure: LAPAROSCOPIC SIGMOID COLECTOMY  ERAS PATHWAY TAKEDOWN OF COLOVESSICAL COLOCUTANEOUS FISTULA;  Surgeon: Teresa Lonni HERO, MD;  Location: [facility];  Service: General;  Laterality: N/A;   LEFT HEART CATH AND CORONARY ANGIOGRAPHY N/A 10/25/2017   Procedure: LEFT HEART CATH AND CORONARY ANGIOGRAPHY;  Surgeon: Anner Alm ORN, MD;  Location: [facility] INVASIVE CV LAB;  Service: Cardiovascular;  Laterality: N/A;   SIGMOIDOSCOPY N/A 02/17/2018   Procedure: FLEX SIGMOIDOSCOPY;  Surgeon: Teresa Lonni HERO, MD;  Location: [facility];  Service: General;  Laterality: N/A;   TEE WITHOUT CARDIOVERSION N/A 10/26/2017   Procedure: TRANSESOPHAGEAL ECHOCARDIOGRAM (TEE);  Surgeon: Lucas Dorise POUR, MD;  Location: [facility] OR;  Service: Open Heart Surgery;  Laterality: N/A;    Family History  Problem Relation Age of Onset   Hyperlipidemia Mother    Alzheimer's disease Mother    Hyperlipidemia Brother    Alzheimer's disease Maternal Grandmother    Colon cancer Neg Hx    Esophageal cancer Neg Hx    Stomach cancer Neg Hx     Allergies  Allergen Reactions   Crestor  [Rosuvastatin  Calcium ]     Reported intolerance to Lipitor in the past Crestor  40mg  patient reported muscle aches, dose reduced to rechallenge    Current Outpatient Medications on File Prior to Visit  Medication Sig Dispense Refill   allopurinol  (ZYLOPRIM ) 100 MG tablet TAKE 1 TABLET BY MOUTH DAILY 100 tablet 2   atorvastatin  (LIPITOR)  40 MG tablet TAKE 1 TABLET BY MOUTH DAILY 100 tablet 3   dofetilide  (TIKOSYN ) 500 MCG capsule Take 1 capsule (500 mcg total) by mouth 2 (two) times daily. 180 capsule 1   ezetimibe  (ZETIA ) 10 MG tablet Take 0.5 tablets (5 mg total) by mouth daily. 90 tablet 1   losartan  (COZAAR ) 25 MG tablet TAKE 1 TABLET BY MOUTH DAILY 100 tablet 3   Magnesium  Chloride 64 MG TABS Take 1 tablet by mouth daily.     potassium chloride  (KLOR-CON ) 10 MEQ tablet Take 1 tablet (10 mEq total) by mouth daily. 90 tablet 3   psyllium (METAMUCIL) 58.6 % powder Take 1 packet by mouth daily.     rivaroxaban  (XARELTO ) 20 MG TABS tablet Take 1 tablet (20 mg total) by mouth daily with supper. 90 tablet 3   vitamin B-12 (CYANOCOBALAMIN ) 500 MCG tablet Take 500 mcg by mouth daily.     No current facility-administered medications on file prior to visit.    BP 135/78   Pulse 69   Resp 15   Ht 5' 9.5 (1.765 m)   Wt 183 lb 6.4 oz (83.2 kg)   SpO2 95%   BMI 26.70 kg/m          Objective:   Physical Exam  General Mental Status- Alert. General Appearance- Not in acute distress.   Skin General: Color- Normal Color. Moisture- Normal Moisture.  Neck Carotid Arteries- Normal color. Moisture- Normal Moisture. No carotid bruits. No JVD.  Chest and Lung Exam Auscultation: Breath Sounds:-Normal.  Cardiovascular Auscultation:Rythm- Regular. Murmurs & Other Heart Sounds:Auscultation of the heart reveals- No Murmurs.  Abdomen Inspection:-Inspeection Normal. Palpation/Percussion:Note:No mass. Palpation and Percussion of the abdomen reveal- Non Tender, Non Distended + BS, no rebound or guarding.   Neurologic Cranial Nerve exam:- CN III-XII intact(No nystagmus), symmetric smile. Strength:- 5/5 equal and symmetric strength both upper and lower extremities.       Assessment & Plan:   Patient Instructions  Intermittent diarrhea and abdominal pain Intermittent diarrhea and abdominal pain possibly related to  Xarelto . Differential includes IBS and other GI issues. History of severe diverticular disease with partial colectomy. No current diverticulitis. - Order stool culture, O and P, and C diff tests if diarrhea recurs. - Perform CBC to check for infection. - Refer to GI specialist for further evaluation. -  Recommend [employer]. -if any constant abdomen pain let me know. in that event consider antibiotic and ct abd/pelvis  Atrial fibrillation Managed with Xarelto  due to intolerance to Eliquis . Xarelto  preferred for convenience and reduced INR monitoring. No common GI side effects noted (but on review possible 2.77% incidence) but reports looser stools since starting medication. - Continue Xarelto . - Discuss potential GI side effects with GI specialist. -continue tikosyn   Decreased GFR of 56. Regular monitoring of kidney function is important. - Monitor kidney function annually. - Ensure adequate hydration before blood tests.  Follow up date to be determined after lab review   Dallas Maxwell, PA-C   I personally spent a total of 42 minutes in the care of the patient today including performing a medically appropriate exam/evaluation, counseling and educating, referring and communicating with other health care professionals, and documenting clinical information in the EHR.

## 2024-08-31 NOTE — Patient Instructions (Signed)
 Intermittent diarrhea and abdominal pain Intermittent diarrhea and abdominal pain possibly related to Xarelto . Differential includes IBS and other GI issues. History of severe diverticular disease with partial colectomy. No current diverticulitis. - Order stool culture, O and P, and C diff tests if diarrhea recurs. - Perform CBC to check for infection. - Refer to GI specialist for further evaluation. - Recommend [employer]. -if any constant abdomen pain let me know. in that event consider antibiotic and ct abd/pelvis  Atrial fibrillation Managed with Xarelto  due to intolerance to Eliquis . Xarelto  preferred for convenience and reduced INR monitoring. No common GI side effects noted (but on review possible 2.77% incidence) but reports looser stools since starting medication. - Continue Xarelto . - Discuss potential GI side effects with GI specialist. -continue tikosyn   Decreased GFR of 56. Regular monitoring of kidney function is important. - Monitor kidney function annually. - Ensure adequate hydration before blood tests.  Follow up date to be determined after lab review

## 2024-09-01 ENCOUNTER — Ambulatory Visit: Payer: Self-pay | Admitting: Medical

## 2024-09-18 ENCOUNTER — Other Ambulatory Visit: Payer: Self-pay | Admitting: Medical

## 2024-09-19 ENCOUNTER — Telehealth: Payer: Self-pay | Admitting: *Deleted

## 2024-09-19 DIAGNOSIS — I4819 Other persistent atrial fibrillation: Secondary | ICD-10-CM

## 2024-09-19 MED ORDER — RIVAROXABAN 20 MG PO TABS
20.0000 mg | ORAL_TABLET | Freq: Every day | ORAL | 2 refills | Status: AC
Start: 2024-09-19 — End: ?

## 2024-09-19 NOTE — Telephone Encounter (Signed)
 Xarelto  20mg  refill request received. Pt is 78 years old, weight-83.2kg, Crea-1.31 on 06/12/24, last seen by Clint Fenton on 06/12/24, Diagnosis-Afib, CrCl-56.45 mL/min; Dose is appropriate based on dosing criteria. Will send in refill to requested pharmacy.

## 2024-10-16 ENCOUNTER — Other Ambulatory Visit (HOSPITAL_COMMUNITY): Payer: Self-pay | Admitting: *Deleted

## 2024-10-16 MED ORDER — DOFETILIDE 500 MCG PO CAPS: 500.0000 ug | ORAL_CAPSULE | Freq: Two times a day (BID) | ORAL | 2 refills | Status: AC

## 2024-10-22 ENCOUNTER — Other Ambulatory Visit (HOSPITAL_COMMUNITY): Payer: Self-pay | Admitting: Physician Assistant

## 2024-11-07 ENCOUNTER — Telehealth: Payer: Self-pay | Admitting: Medical

## 2024-11-07 NOTE — Telephone Encounter (Signed)
 Copied from CRM [id number]. Topic: Medicare AWV >> Nov 07, 2024  9:59 AM Nathanel DEL wrote: Called LVM 11/07/2024 to sched AWV. Please schedule in office or virtual visit.   Nathanel Paschal; Care Guide Ambulatory Clinical Support [place] [facility] Direct Dial: [phone]

## 2024-11-20 NOTE — Progress Notes (Unsigned)
 Electrophysiology Office Note:   Date:  11/21/2024  ID:  Therron Sells, DOB 1946-02-08, MRN [id number]  Primary Cardiologist: Aleene Passe, MD (Inactive) Primary Heart Failure: None Electrophysiologist: Shamika Pedregon Gladis Norton, MD      History of Present Illness:   Dean [name] is a 78 y.o. male with h/o atrial fibrillation, hyperlipidemia, coronary artery disease seen today for routine electrophysiology followup.   Discussed the use of AI scribe software for clinical note transcription with the patient, who gave verbal consent to proceed.  History of Present Illness Aleksei Goodlin is a 78 year old male with atrial fibrillation who presents for routine follow-up.  He has a history of atrial fibrillation and reports that his monitoring device has consistently shown normal results. He uses an electronic device connected to his phone to monitor his heart rhythm, which has consistently shown normal results.  His blood pressure readings are generally below 120 mmHg, but occasionally rise to 140 mmHg and rarely to 150 mmHg. He is focused on maintaining an average blood pressure below 130/80 mmHg.  He maintains an active lifestyle, walking for an hour six days a week. He recently had a full set of lab work done by his general practitioner within the last six months.  he denies chest pain, palpitations, dyspnea, PND, orthopnea, nausea, vomiting, dizziness, syncope, edema, weight gain, or early satiety.   Review of systems complete and found to be negative unless listed in HPI.   EP Information / Studies Reviewed:    EKG is ordered today. Personal review as below.  EKG Interpretation Date/Time:  Wednesday November 21 2024 10:55:56 EST Ventricular Rate:  54 PR Interval:  216 QRS Duration:  134 QT Interval:  486 QTC Calculation: 460 R Axis:   94  Text Interpretation: Sinus bradycardia with 1st degree A-V block Rightward axis Non-specific intra-ventricular conduction block When compared  with ECG of 12-Jun-2024 08:54, Questionable change in QRS axis Confirmed by Charnelle Bergeman ([id number]) on 11/21/2024 11:06:10 AM     Risk Assessment/Calculations:    CHA2DS2-VASc Score =     This indicates a  % annual risk of stroke. The patient's score is based upon:             Physical Exam:   VS:  BP (!) 150/72 (BP Location: Right Arm, Patient Position: Sitting, Cuff Size: Normal)   Pulse (!) 52   Ht 5' 9.5 (1.765 m)   Wt 183 lb (83 kg)   SpO2 96%   BMI 26.64 kg/m    Wt Readings from Last 3 Encounters:  11/21/24 183 lb (83 kg)  08/31/24 183 lb 6.4 oz (83.2 kg)  06/12/24 183 lb 6.4 oz (83.2 kg)     GEN: Well nourished, well developed in no acute distress NECK: No JVD; No carotid bruits CARDIAC: Regular rate and rhythm, no murmurs, rubs, gallops RESPIRATORY:  Clear to auscultation without rales, wheezing or rhonchi  ABDOMEN: Soft, non-tender, non-distended EXTREMITIES:  No edema; No deformity   ASSESSMENT AND PLAN:    1.  Persistent atrial fibrillation: On dofetilide .  He remains in sinus rhythm without obvious recurrence.  Shelina Luo continue with current management.  2.  Secondary hypercoagulable state: On Eliquis   3.  High risk medication monitoring: On dofetilide .  Shellsea Borunda check a BMP and magnesium  today.  4.  Coronary artery disease: No current chest pain.  Plan per primary cardiology.  5.  Hypertension: Elevated today.  Usually well-controlled.  Plan per primary care.  Follow up with EP Team in 6  months  Signed, Jovanna Hodges Gladis Norton, MD

## 2024-11-21 ENCOUNTER — Encounter: Payer: Self-pay | Admitting: Cardiology

## 2024-11-21 ENCOUNTER — Ambulatory Visit: Attending: Cardiovascular Disease | Admitting: Cardiology

## 2024-11-21 VITALS — BP 150/72 | HR 52 | Ht 69.5 in | Wt 183.0 lb

## 2024-11-21 DIAGNOSIS — D6869 Other thrombophilia: Secondary | ICD-10-CM

## 2024-11-21 DIAGNOSIS — I10 Essential (primary) hypertension: Secondary | ICD-10-CM | POA: Diagnosis not present

## 2024-11-21 DIAGNOSIS — Z79899 Other long term (current) drug therapy: Secondary | ICD-10-CM | POA: Diagnosis not present

## 2024-11-21 DIAGNOSIS — I2510 Atherosclerotic heart disease of native coronary artery without angina pectoris: Secondary | ICD-10-CM | POA: Diagnosis not present

## 2024-11-21 DIAGNOSIS — Z5181 Encounter for therapeutic drug level monitoring: Secondary | ICD-10-CM | POA: Diagnosis not present

## 2024-11-21 DIAGNOSIS — I4819 Other persistent atrial fibrillation: Secondary | ICD-10-CM | POA: Diagnosis not present

## 2024-11-21 LAB — BASIC METABOLIC PANEL WITH GFR
BUN/Creatinine Ratio: 17 (ref 10–24)
BUN: 21 mg/dL (ref 8–27)
CO2: 24 mmol/L (ref 20–29)
Calcium: 9.4 mg/dL (ref 8.6–10.2)
Chloride: 102 mmol/L (ref 96–106)
Creatinine, Ser: 1.24 mg/dL (ref 0.76–1.27)
Glucose: 97 mg/dL (ref 70–99)
Potassium: 5.3 mmol/L — ABNORMAL HIGH (ref 3.5–5.2)
Sodium: 139 mmol/L (ref 134–144)
eGFR: 60 mL/min/1.73 (ref >59)

## 2024-11-21 LAB — MAGNESIUM: Magnesium: 2.1 mg/dL (ref 1.6–2.3)

## 2024-11-21 NOTE — Addendum Note (Signed)
 Addended by: GRETEL MAEOLA CROME on: 11/21/2024 11:25 AM   Modules accepted: Orders

## 2024-11-21 NOTE — Patient Instructions (Addendum)
 Medication Instructions:  Your physician recommends that you continue on your current medications as directed. Please refer to the Current Medication list given to you today.  *If you need a refill on your cardiac medications before your next appointment, please call your pharmacy*  Lab Work: Tikosyn  surveillance labs today: Magnesium  & BMET  If you have any lab test that is abnormal or we need to change your treatment, we will call you to review the results.  Testing/Procedures: None ordered  Follow-Up: At [facility], you and your health needs are our priority.  As part of our continuing mission to provide you with exceptional heart care, our providers are all part of one team.  This team includes your primary Cardiologist (physician) and Advanced Practice Providers or APPs (Physician Assistants and Nurse Practitioners) who all work together to provide you with the care you need, when you need it.  Your next appointment:   6 month(s)  Provider:   Soyla Norton, MD     Thank you for choosing [facility]!!   Maeola Domino, RN [phone]

## 2024-12-03 DIAGNOSIS — Z5181 Encounter for therapeutic drug level monitoring: Secondary | ICD-10-CM

## 2024-12-03 DIAGNOSIS — I4819 Other persistent atrial fibrillation: Secondary | ICD-10-CM

## 2024-12-03 NOTE — Telephone Encounter (Signed)
 Left message for patient with recommendations from Dr. Inocencio. Advised patient to respond on MyChart or give us  a call back letting us  know that he is aware of recommendations.

## 2024-12-12 ENCOUNTER — Encounter: Payer: Self-pay | Admitting: Cardiovascular Disease

## 2024-12-13 LAB — BASIC METABOLIC PANEL WITH GFR
BUN/Creatinine Ratio: 16 (ref 10–24)
BUN: 20 mg/dL (ref 8–27)
CO2: 22 mmol/L (ref 20–29)
Calcium: 9.4 mg/dL (ref 8.6–10.2)
Chloride: 103 mmol/L (ref 96–106)
Creatinine, Ser: 1.25 mg/dL (ref 0.76–1.27)
Glucose: 105 mg/dL — ABNORMAL HIGH (ref 70–99)
Potassium: 4.8 mmol/L (ref 3.5–5.2)
Sodium: 142 mmol/L (ref 134–144)
eGFR: 60 mL/min/1.73

## 2024-12-21 ENCOUNTER — Ambulatory Visit (INDEPENDENT_AMBULATORY_CARE_PROVIDER_SITE_OTHER)

## 2024-12-21 VITALS — BP 130/78 | Ht 69.5 in | Wt 187.0 lb

## 2024-12-21 DIAGNOSIS — Z0000 Encounter for general adult medical examination without abnormal findings: Secondary | ICD-10-CM | POA: Diagnosis not present

## 2024-12-21 NOTE — Progress Notes (Signed)
 " I connected with  Karena Platts on 12/21/24 by a In Person Visit   Patient Location: Other:  officr/Clinic  Provider Location: Office/Clinic  Persons Participating in Visit: Patient.   Chief Complaint  Patient presents with   Medicare Wellness     Subjective:   Dean [name] is a 79 y.o. male who presents for a Medicare Annual Wellness Visit.  Visit info / Clinical Intake: Medicare Wellness Visit Type:: Subsequent Annual Wellness Visit Persons participating in visit and providing information:: patient Medicare Wellness Visit Mode:: In-person (required for WTM) Interpreter Needed?: No Pre-visit prep was completed: yes AWV questionnaire completed by patient prior to visit?: yes Date:: 12/16/24 Living arrangements:: (!) (Patient-Rptd) lives alone Patient's Overall Health Status Rating: (Patient-Rptd) very good Typical amount of pain: (Patient-Rptd) none Does pain affect daily life?: (Patient-Rptd) no Are you currently prescribed opioids?: no  Dietary Habits and Nutritional Risks How many meals a day?: (Patient-Rptd) 3 Eats fruit and vegetables daily?: (Patient-Rptd) yes Most meals are obtained by: (Patient-Rptd) preparing own meals In the last 2 weeks, have you had any of the following?: none Diabetic:: no  Functional Status Activities of Daily Living (to include ambulation/medication): (Patient-Rptd) Independent Ambulation: (Patient-Rptd) Independent Medication Administration: (Patient-Rptd) Independent Home Management (perform basic housework or laundry): (Patient-Rptd) Independent Manage your own finances?: (Patient-Rptd) yes Primary transportation is: (Patient-Rptd) driving Concerns about vision?: no *vision screening is required for WTM* Concerns about hearing?: no  Fall Screening Falls in the past year?: (Patient-Rptd) 0 Number of falls in past year: 0 Was there an injury with Fall?: 0 Fall Risk Category Calculator: 0 Patient Fall Risk Level: Low Fall  Risk  Fall Risk Patient at Risk for Falls Due to: No Fall Risks Fall risk Follow up: Falls evaluation completed  Home and Transportation Safety: All rugs have non-skid backing?: (Patient-Rptd) yes All stairs or steps have railings?: (Patient-Rptd) yes Grab bars in the bathtub or shower?: (!) (Patient-Rptd) no Have non-skid surface in bathtub or shower?: (Patient-Rptd) yes Good home lighting?: (Patient-Rptd) yes Regular seat belt use?: (Patient-Rptd) yes Hospital stays in the last year:: (Patient-Rptd) no  Cognitive Assessment Difficulty concentrating, remembering, or making decisions? : (Patient-Rptd) no Will 6CIT or Mini Cog be Completed: yes What year is it?: 0 points What month is it?: 0 points Give patient an address phrase to remember (5 components): remember words apple, table. penny About what time is it?: 0 points Count backwards from 20 to 1: 0 points Say the months of the year in reverse: 0 points Repeat the address phrase from earlier: 4 points 6 CIT Score: 4 points  Advance Directives (For Healthcare) Does Patient Have a Medical Advance Directive?: Yes Does patient want to make changes to medical advance directive?: No - Patient declined Type of Advance Directive: Living will Copy of Living Will in Chart?: No - copy requested Would patient like information on creating a medical advance directive?: No - Patient declined  Reviewed/Updated  Reviewed/Updated: Reviewed All (Medical, Surgical, Family, Medications, Allergies, Care Teams, Patient Goals)    Allergies (verified) Crestor  [rosuvastatin  calcium ]   Current Medications (verified) Outpatient Encounter Medications as of 12/21/2024  Medication Sig   allopurinol  (ZYLOPRIM ) 100 MG tablet TAKE 1 TABLET BY MOUTH DAILY   atorvastatin  (LIPITOR) 40 MG tablet TAKE 1 TABLET BY MOUTH DAILY   dofetilide  (TIKOSYN ) 500 MCG capsule Take 1 capsule (500 mcg total) by mouth 2 (two) times daily.   ezetimibe  (ZETIA ) 10 MG  tablet Take 0.5 tablets (5 mg total) by mouth daily.  losartan  (COZAAR ) 25 MG tablet TAKE 1 TABLET BY MOUTH DAILY   Magnesium  Chloride 64 MG TABS Take 1 tablet by mouth daily.   psyllium (METAMUCIL) 58.6 % powder Take 1 packet by mouth daily.   rivaroxaban  (XARELTO ) 20 MG TABS tablet Take 1 tablet (20 mg total) by mouth daily with supper.   vitamin B-12 (CYANOCOBALAMIN ) 500 MCG tablet Take 500 mcg by mouth daily.   No facility-administered encounter medications on file as of 12/21/2024.    History: Past Medical History:  Diagnosis Date   Acid reflux    history of   Anemia    Atrial fibrillation (HCC) 10/2017   RVR   Coronary artery disease    Diverticulitis 11/26/2017   Gout    Hepatitis A age 55   History of kidney stones    Hyperlipemia    Hypogonadism male    Irregular heart beat    Ischemic cardiomyopathy    Non compliance with medical treatment    Non-STEMI (non-ST elevated myocardial infarction) (HCC) 10/2017   Pelvic abscess in male [facility])    percutaneous drain placement   Past Surgical History:  Procedure Laterality Date   BLADDER REPAIR  02/17/2018   Procedure: BLADDER REPAIR;  Surgeon: Teresa Lonni HERO, MD;  Location: [facility];  Service: General;;   BLADDER REPAIR  02/17/2018   Procedure: pelvic exploration, assist with bladder repair ;  Surgeon: Renda Glance, MD;  Location: [facility];  Service: Urology;;   COLONOSCOPY  ~ 2014   Dr Zulema in [facility].  Diverticulosis.  pt denies colon polyps.     CORONARY ARTERY BYPASS GRAFT N/A 10/26/2017   Procedure: CORONARY ARTERY BYPASS GRAFTING (CABG) x four , using left internal mammary artery and bilateral legs greater saphenous vein harvested endoscopically;  Surgeon: Lucas Dorise POUR, MD;  Location: [facility] OR;  Service: Open Heart Surgery;  Laterality: N/A;   CYSTOSCOPY W/ URETERAL STENT PLACEMENT Bilateral 02/17/2018   Procedure: CYSTOSCOPY WITH RETROGRADE PYELOGRAM/URETERAL STENT PLACEMENT;  Surgeon: Cam Morene ORN, MD;   Location: [facility];  Service: Urology;  Laterality: Bilateral;   IR CATHETER TUBE CHANGE  01/27/2018   IR RADIOLOGIST EVAL & MGMT  12/21/2017   LAPAROSCOPIC SIGMOID COLECTOMY N/A 02/17/2018   Procedure: LAPAROSCOPIC SIGMOID COLECTOMY  ERAS PATHWAY TAKEDOWN OF COLOVESSICAL COLOCUTANEOUS FISTULA;  Surgeon: Teresa Lonni HERO, MD;  Location: [facility];  Service: General;  Laterality: N/A;   LEFT HEART CATH AND CORONARY ANGIOGRAPHY N/A 10/25/2017   Procedure: LEFT HEART CATH AND CORONARY ANGIOGRAPHY;  Surgeon: Anner Alm ORN, MD;  Location: [facility] INVASIVE CV LAB;  Service: Cardiovascular;  Laterality: N/A;   SIGMOIDOSCOPY N/A 02/17/2018   Procedure: FLEX SIGMOIDOSCOPY;  Surgeon: Teresa Lonni HERO, MD;  Location: [facility];  Service: General;  Laterality: N/A;   TEE WITHOUT CARDIOVERSION N/A 10/26/2017   Procedure: TRANSESOPHAGEAL ECHOCARDIOGRAM (TEE);  Surgeon: Lucas Dorise POUR, MD;  Location: [facility] OR;  Service: Open Heart Surgery;  Laterality: N/A;   Family History  Problem Relation Age of Onset   Hyperlipidemia Mother    Alzheimer's disease Mother    Hyperlipidemia Brother    Alzheimer's disease Maternal Grandmother    Colon cancer Neg Hx    Esophageal cancer Neg Hx    Stomach cancer Neg Hx    Social History   Occupational History   Occupation: retired  Tobacco Use   Smoking status: Former   Smokeless tobacco: Never   Tobacco comments:    Former smoker 12/15/23  Vaping Use  Vaping status: Never Used  Substance and Sexual Activity   Alcohol use: Not Currently   Drug use: No   Sexual activity: Not on file   Tobacco Counseling Counseling given: Not Answered Tobacco comments: Former smoker 12/15/23  SDOH Screenings   Food Insecurity: No Food Insecurity (12/16/2024)  Housing: Low Risk (12/16/2024)  Transportation Needs: No Transportation Needs (12/16/2024)  Utilities: Not At Risk (12/21/2024)  Alcohol Screen: Low Risk (12/16/2024)  Depression (PHQ2-9): Low Risk (12/21/2024)  Financial  Resource Strain: Low Risk (12/16/2024)  Physical Activity: Sufficiently Active (12/16/2024)  Social Connections: Socially Isolated (12/16/2024)  Stress: No Stress Concern Present (12/16/2024)  Tobacco Use: Medium Risk (12/21/2024)  Health Literacy: Adequate Health Literacy (12/21/2024)   See flowsheets for full screening details  Depression Screen PHQ 2 & 9 Depression Scale- Over the past 2 weeks, how often have you been bothered by any of the following problems? Little interest or pleasure in doing things: 0 Feeling down, depressed, or hopeless (PHQ Adolescent also includes...irritable): 0 PHQ-2 Total Score: 0 Trouble falling or staying asleep, or sleeping too much: 1 Feeling tired or having little energy: 1 Poor appetite or overeating (PHQ Adolescent also includes...weight loss): 0 Feeling bad about yourself - or that you are a failure or have let yourself or your family down: 0 Trouble concentrating on things, such as reading the newspaper or watching television (PHQ Adolescent also includes...like school work): 0 Moving or speaking so slowly that other people could have noticed. Or the opposite - being so fidgety or restless that you have been moving around a lot more than usual: 0 Thoughts that you would be better off dead, or of hurting yourself in some way: 0 PHQ-9 Total Score: 2 If you checked off any problems, how difficult have these problems made it for you to do your work, take care of things at home, or get along with other people?: Not difficult at all  Depression Treatment Depression Interventions/Treatment : EYV7-0 Score <4 Follow-up Not Indicated     Goals Addressed             This Visit's Progress    Patient Stated       Patient states he would like to traveling              Objective:    Today's Vitals   12/21/24 0840  BP: 130/78  Weight: 187 lb (84.8 kg)  Height: 5' 9.5 (1.765 m)   Body mass index is 27.22 kg/m.  Hearing/Vision screen Hearing  Screening - Comments:: No difficulties Vision Screening - Comments:: Patient wears glasses.  Immunizations and Health Maintenance Health Maintenance  Topic Date Due   Hepatitis C Screening  Never done   DTaP/Tdap/Td (1 - Tdap) Never done   Medicare Annual Wellness (AWV)  10/22/2023   Influenza Vaccine  03/05/2025 (Originally 07/06/2024)   Pneumococcal Vaccine: 50+ Years (1 of 2 - PCV) 08/31/2025 (Originally 03/01/1965)   COVID-19 Vaccine (5 - 2025-26 season) 09/16/2025 (Originally 08/06/2024)   Zoster Vaccines- Shingrix  Completed   Meningococcal B Vaccine  Aged Out   Colonoscopy  Discontinued        Assessment/Plan:  This is a routine wellness examination for Dean [name].  Patient Care Team: Saguier, Edward, PA-C as PCP - General (Internal Medicine) Nahser, Aleene PARAS, MD (Inactive) as PCP - Cardiology (Cardiology) Inocencio Soyla Lunger, MD as PCP - Electrophysiology (Cardiology) Nahser, Aleene PARAS, MD (Inactive) as Consulting Physician (Cardiology) Lucas Dorise POUR, MD as Consulting Physician (Cardiothoracic Surgery) Aneita,  Gwendlyn DASEN, MD (Inactive) as Consulting Physician (Gastroenterology)  I have personally reviewed and noted the following in the patients chart:   Medical and social history Use of alcohol, tobacco or illicit drugs  Current medications and supplements including opioid prescriptions. Functional ability and status Nutritional status Physical activity Advanced directives List of other physicians Hospitalizations, surgeries, and ER visits in previous 12 months Vitals Screenings to include cognitive, depression, and falls Referrals and appointments  No orders of the defined types were placed in this encounter.  In addition, I have reviewed and discussed with patient certain preventive protocols, quality metrics, and best practice recommendations. A written personalized care plan for preventive services as well as general preventive health recommendations were provided to  patient.   Lyle MARLA Right, [place]   12/21/2024   No follow-ups on file.  After Visit Summary: (MyChart) Due to this being a telephonic visit, the after visit summary with patients personalized plan was offered to patient via MyChart   No voiced or noted concerns at this time Separate telephone note sent for (Vaccination) Vaccines not given: Tdap declined today  "

## 2024-12-21 NOTE — Patient Instructions (Signed)
 Dean [name],  Thank you for taking the time for your Medicare Wellness Visit. I appreciate your continued commitment to your health goals. Please review the care plan we discussed, and feel free to reach out if I can assist you further.  Please note that Annual Wellness Visits do not include a physical exam. Some assessments may be limited, especially if the visit was conducted virtually. If needed, we may recommend an in-person follow-up with your provider.  Ongoing Care Seeing your primary care provider every 3 to 6 months helps us  monitor your health and provide consistent, personalized care.   Referrals If a referral was made during today's visit and you haven't received any updates within two weeks, please contact the referred provider directly to check on the status.  Recommended Screenings:  Health Maintenance  Topic Date Due   Hepatitis C Screening  Never done   DTaP/Tdap/Td vaccine (1 - Tdap) Never done   Medicare Annual Wellness Visit  10/22/2023   Flu Shot  03/05/2025*   Pneumococcal Vaccine for age over 82 (1 of 2 - PCV) 08/31/2025*   COVID-19 Vaccine (5 - 2025-26 season) 09/16/2025*   Zoster (Shingles) Vaccine  Completed   Meningitis B Vaccine  Aged Out   Colon Cancer Screening  Discontinued  *Topic was postponed. The date shown is not the original due date.       12/16/2024   12:54 PM  Advanced Directives  Does Patient Have a Medical Advance Directive? Yes  Type of Advance Directive Living will  Does patient want to make changes to medical advance directive? No - Patient declined  Would patient like information on creating a medical advance directive? No - Patient declined    Vision: Annual vision screenings are recommended for early detection of glaucoma, cataracts, and diabetic retinopathy. These exams can also reveal signs of chronic conditions such as diabetes and high blood pressure.  Dental: Annual dental screenings help detect early signs of oral cancer,  gum disease, and other conditions linked to overall health, including heart disease and diabetes.  Please see the attached documents for additional preventive care recommendations.

## 2024-12-23 ENCOUNTER — Telehealth: Payer: Self-pay | Admitting: Medical

## 2024-12-23 NOTE — Telephone Encounter (Signed)
 I am still waiting on your clarification as you sent me vague question that need clarification to answer. Please clarify

## 2024-12-24 NOTE — Telephone Encounter (Signed)
 Pt called but no answer. Detailed message left on vm

## 2024-12-24 NOTE — Telephone Encounter (Signed)
 Also according to his imm record, it does indeed look like he has never had a flu vaccine before

## 2024-12-25 NOTE — Telephone Encounter (Signed)
 Called pt again with no answer. Lrft vm for pt to call back

## 2025-01-03 ENCOUNTER — Telehealth: Payer: Self-pay

## 2025-01-03 NOTE — Telephone Encounter (Signed)
 Called pt back and he says he has never received the flu vaccine before but says he saw in the [place]  times that it decreases the chance of getting dementia. He also says he has a heart problem and would it effect his heart. I told him that I have never heard of it effecting the heart. Pcp then asked to speak to pt and advised him on potential side effects and benefits along with other questions the pt had. Pt was understanding   Copied from CRM #[id number]. Topic: General - Other >> Jan 03, 2025  8:50 AM Carlyon D wrote: Reason for CRM: Pt is calling to speak to miss [facility] who had been calling him. Called CAL she is unavailable.  Please reach out to pt at earliest convenience

## 2025-05-22 ENCOUNTER — Ambulatory Visit (HOSPITAL_COMMUNITY): Admitting: Physician Assistant
